# Patient Record
Sex: Female | Born: 1935 | Race: White | Hispanic: No | Smoking: Never smoker
Health system: Southern US, Community
[De-identification: ages and names within clinical notes are randomized; demographics above are authoritative.]

## PROBLEM LIST (undated history)

## (undated) DIAGNOSIS — I10 Essential (primary) hypertension: Secondary | ICD-10-CM

## (undated) DIAGNOSIS — E7800 Pure hypercholesterolemia, unspecified: Secondary | ICD-10-CM

## (undated) DIAGNOSIS — Z87442 Personal history of urinary calculi: Secondary | ICD-10-CM

## (undated) DIAGNOSIS — C801 Malignant (primary) neoplasm, unspecified: Secondary | ICD-10-CM

## (undated) HISTORY — DX: Essential (primary) hypertension: I10

## (undated) HISTORY — PX: TONSILLECTOMY AND ADENOIDECTOMY: SHX28

## (undated) HISTORY — PX: EYE SURGERY: SHX253

## (undated) HISTORY — PX: TONSILLECTOMY: SUR1361

## (undated) HISTORY — PX: APPENDECTOMY: SHX54

---

## 2002-06-09 ENCOUNTER — Emergency Department (HOSPITAL_COMMUNITY): Admission: EM | Admit: 2002-06-09 | Discharge: 2002-06-10 | Payer: Self-pay | Admitting: Emergency Medicine

## 2009-08-29 HISTORY — PX: OTHER SURGICAL HISTORY: SHX169

## 2010-03-02 ENCOUNTER — Ambulatory Visit (HOSPITAL_COMMUNITY): Admission: RE | Admit: 2010-03-02 | Discharge: 2010-03-02 | Payer: Self-pay | Admitting: Internal Medicine

## 2010-03-03 ENCOUNTER — Ambulatory Visit (HOSPITAL_COMMUNITY): Admission: RE | Admit: 2010-03-03 | Discharge: 2010-03-03 | Payer: Self-pay | Admitting: Internal Medicine

## 2013-10-08 DIAGNOSIS — R109 Unspecified abdominal pain: Secondary | ICD-10-CM | POA: Diagnosis not present

## 2013-10-08 DIAGNOSIS — N390 Urinary tract infection, site not specified: Secondary | ICD-10-CM | POA: Diagnosis not present

## 2013-11-18 ENCOUNTER — Other Ambulatory Visit (HOSPITAL_COMMUNITY): Payer: Self-pay | Admitting: Internal Medicine

## 2013-11-18 ENCOUNTER — Ambulatory Visit (HOSPITAL_COMMUNITY)
Admission: RE | Admit: 2013-11-18 | Discharge: 2013-11-18 | Disposition: A | Discharge status: Home / Self Care | Payer: Medicare Other | Source: Ambulatory Visit | Attending: Internal Medicine | Admitting: Internal Medicine

## 2013-11-18 DIAGNOSIS — R059 Cough, unspecified: Secondary | ICD-10-CM | POA: Diagnosis not present

## 2013-11-18 DIAGNOSIS — J40 Bronchitis, not specified as acute or chronic: Secondary | ICD-10-CM

## 2013-11-18 DIAGNOSIS — Z982 Presence of cerebrospinal fluid drainage device: Secondary | ICD-10-CM | POA: Diagnosis not present

## 2013-11-18 DIAGNOSIS — R05 Cough: Secondary | ICD-10-CM | POA: Insufficient documentation

## 2013-11-18 DIAGNOSIS — R509 Fever, unspecified: Secondary | ICD-10-CM | POA: Insufficient documentation

## 2013-11-18 DIAGNOSIS — J410 Simple chronic bronchitis: Secondary | ICD-10-CM | POA: Diagnosis not present

## 2013-11-20 ENCOUNTER — Emergency Department (HOSPITAL_COMMUNITY)
Admission: EM | Admit: 2013-11-20 | Discharge: 2013-11-20 | Disposition: A | Discharge status: Home / Self Care | Payer: Medicare Other | Attending: Emergency Medicine | Admitting: Emergency Medicine

## 2013-11-20 ENCOUNTER — Emergency Department (HOSPITAL_COMMUNITY): Payer: Medicare Other

## 2013-11-20 ENCOUNTER — Encounter (HOSPITAL_COMMUNITY): Payer: Self-pay | Admitting: Emergency Medicine

## 2013-11-20 DIAGNOSIS — Z79899 Other long term (current) drug therapy: Secondary | ICD-10-CM | POA: Diagnosis not present

## 2013-11-20 DIAGNOSIS — J069 Acute upper respiratory infection, unspecified: Secondary | ICD-10-CM

## 2013-11-20 DIAGNOSIS — Z792 Long term (current) use of antibiotics: Secondary | ICD-10-CM | POA: Diagnosis not present

## 2013-11-20 DIAGNOSIS — R509 Fever, unspecified: Secondary | ICD-10-CM

## 2013-11-20 DIAGNOSIS — R059 Cough, unspecified: Secondary | ICD-10-CM | POA: Diagnosis not present

## 2013-11-20 DIAGNOSIS — R071 Chest pain on breathing: Secondary | ICD-10-CM | POA: Diagnosis not present

## 2013-11-20 DIAGNOSIS — R05 Cough: Secondary | ICD-10-CM | POA: Diagnosis not present

## 2013-11-20 LAB — URINALYSIS, ROUTINE W REFLEX MICROSCOPIC
Glucose, UA: NEGATIVE mg/dL
HGB URINE DIPSTICK: NEGATIVE
Ketones, ur: 15 mg/dL — AB
Leukocytes, UA: NEGATIVE
NITRITE: NEGATIVE
PH: 5 (ref 5.0–8.0)
Protein, ur: NEGATIVE mg/dL
Urobilinogen, UA: 0.2 mg/dL (ref 0.0–1.0)

## 2013-11-20 LAB — CBC WITH DIFFERENTIAL/PLATELET
Basophils Absolute: 0 10*3/uL (ref 0.0–0.1)
Basophils Relative: 0 % (ref 0–1)
EOS ABS: 0 10*3/uL (ref 0.0–0.7)
EOS PCT: 1 % (ref 0–5)
HCT: 35.7 % — ABNORMAL LOW (ref 36.0–46.0)
HEMOGLOBIN: 11.4 g/dL — AB (ref 12.0–15.0)
LYMPHS PCT: 13 % (ref 12–46)
Lymphs Abs: 0.4 10*3/uL — ABNORMAL LOW (ref 0.7–4.0)
MCH: 29.5 pg (ref 26.0–34.0)
MCHC: 31.9 g/dL (ref 30.0–36.0)
MCV: 92.5 fL (ref 78.0–100.0)
MONOS PCT: 9 % (ref 3–12)
Monocytes Absolute: 0.3 10*3/uL (ref 0.1–1.0)
Neutro Abs: 2.2 10*3/uL (ref 1.7–7.7)
Neutrophils Relative %: 77 % (ref 43–77)
Platelets: 139 10*3/uL — ABNORMAL LOW (ref 150–400)
RBC: 3.86 MIL/uL — AB (ref 3.87–5.11)
RDW: 14.4 % (ref 11.5–15.5)
WBC: 2.8 10*3/uL — ABNORMAL LOW (ref 4.0–10.5)

## 2013-11-20 LAB — BASIC METABOLIC PANEL
BUN: 27 mg/dL — AB (ref 6–23)
CALCIUM: 8.8 mg/dL (ref 8.4–10.5)
CHLORIDE: 106 meq/L (ref 96–112)
CO2: 26 meq/L (ref 19–32)
Creatinine, Ser: 1.18 mg/dL — ABNORMAL HIGH (ref 0.50–1.10)
GFR calc non Af Amer: 41 mL/min — ABNORMAL LOW (ref >90)
GFR, EST AFRICAN AMERICAN: 47 mL/min — AB (ref >90)
Glucose, Bld: 107 mg/dL — ABNORMAL HIGH (ref 70–99)
POTASSIUM: 4.5 meq/L (ref 3.7–5.3)
SODIUM: 142 meq/L (ref 137–147)

## 2013-11-20 NOTE — ED Notes (Signed)
PT c/o cough and fever since Monday.  Reports has been taking tylenol but can't get temp below 100.  Reports had chest x ray Monday and was put on antibiotics by dr. Legrand Rams.  Pt took 1st dose last night around 6pm.

## 2013-11-20 NOTE — ED Provider Notes (Addendum)
CSN: [id number]     Arrival date & time 11/20/13  1229 History  This chart was scribed for Merryl Hacker, MD by Anastasia Pall, ED Scribe. This patient was seen in room APA19/APA19 and the patient's care was started at 1:16 PM.    Chief Complaint  Patient presents with  . Cough  . Fever   (Consider location/radiation/quality/duration/timing/severity/associated sxs/prior Treatment) The history is provided by the patient. No language interpreter was used.   HPI Comments: Carol Duarte is a 80 y.o. female who presents to the Emergency Department complaining of unproductive cough, onset 3 days ago, with associated fever, with a max temperature of 101.9. Relatives states pt has not been able to reduce fever below 100, despite tylenol.  She reports having a breathing treatment and CXR done 2 days ago. Pt denies noticing a difference with the breathing treatment. She was given antibiotics 2 days ago, but just started the course yesterday. She reports anterior chest pain but only with coughing and worse with coughing.  No exertional component and denies CP now.  She denies being around any sick contacts. She states she lives with her daughter. She reports having bladder infection 3 weeks ago. She denies having flu immunization this year. She denies SOB, fever, body aches, and any other associated symptoms. She reports h/o cholesterolemia and HTN, takes medication. She denies h/o smoking, EtOH use.   PCP [name], MD  History reviewed. No pertinent past medical history. Past Surgical History  Procedure Laterality Date  . Brain cyst removed     No family history on file. History  Substance Use Topics  . Smoking status: Never Smoker   . Smokeless tobacco: Not on file  . Alcohol Use: No   OB History   Grav Para Term Preterm Abortions TAB SAB Ect Mult Living                 Review of Systems  Constitutional: Positive for fever and chills.  Respiratory: Positive for cough. Negative for  chest tightness and shortness of breath.   Cardiovascular: Negative for chest pain.  Gastrointestinal: Negative for nausea, vomiting and abdominal pain.  Genitourinary: Negative for dysuria.  Musculoskeletal: Negative for back pain and myalgias.  Skin: Negative for rash.  Neurological: Negative for headaches.  Psychiatric/Behavioral: Negative for confusion.  All other systems reviewed and are negative.   Allergies  Review of patient's allergies indicates no known allergies.  Home Medications   Current Outpatient Rx  Name  Route  Sig  Dispense  Refill  . acetaminophen (TYLENOL) 500 MG tablet   Oral   Take 1,000 mg by mouth every 6 (six) hours as needed for fever.         Marland Kitchen azithromycin (ZITHROMAX) 250 MG tablet   Oral   Take 250 mg by mouth See admin instructions. Take 2 tabs the first day, then 1 tab until gone.  Starting 11/19/2013 (z-pak)         . benzonatate (TESSALON) 100 MG capsule   Oral   Take 100 mg by mouth 3 (three) times daily.         Marland Kitchen labetalol (NORMODYNE) 100 MG tablet   Oral   Take 100 mg by mouth daily.         Marland Kitchen oxybutynin (DITROPAN) 5 MG tablet   Oral   Take 5 mg by mouth 3 (three) times daily.         . simvastatin (ZOCOR) 40 MG tablet   Oral  Take 40 mg by mouth daily.           BP 175/73  Pulse 63  Temp(Src) 99.2 F (37.3 C) (Oral)  Resp 18  Ht 5' 2.75" (1.594 m)  Wt 140 lb (63.504 kg)  BMI 24.99 kg/m2  SpO2 96%  Physical Exam  Nursing note and vitals reviewed. Constitutional: She is oriented to person, place, and time. No distress.  Elderly, appears younger than stated age  HENT:  Head: Normocephalic and atraumatic.  Mouth/Throat: Oropharynx is clear and moist.  Eyes: Pupils are equal, round, and reactive to light.  Neck: Neck supple.  Cardiovascular: Normal rate, regular rhythm and normal heart sounds.   No murmur heard. Pulmonary/Chest: Effort normal and breath sounds normal. No respiratory distress. She has no  wheezes. She exhibits tenderness.  Abdominal: Soft. Bowel sounds are normal. There is no tenderness. There is no rebound and no guarding.  Neurological: She is alert and oriented to person, place, and time.  Skin: Skin is warm and dry. No rash noted.  Psychiatric: She has a normal mood and affect.    ED Course  Procedures (including critical care time)  DIAGNOSTIC STUDIES: Oxygen Saturation is 96% on room air, normal by my interpretation.    COORDINATION OF CARE: 1:20 PM-Discussed treatment plan which includes CXR and blood work with pt at bedside and pt agreed to plan.   Results for orders placed during the hospital encounter of 11/20/13  CBC WITH DIFFERENTIAL      Result Value Ref Range   WBC 2.8 (*) 4.0 - 10.5 K/uL   RBC 3.86 (*) 3.87 - 5.11 MIL/uL   Hemoglobin 11.4 (*) 12.0 - 15.0 g/dL   HCT 35.7 (*) 36.0 - 46.0 %   MCV 92.5  78.0 - 100.0 fL   MCH 29.5  26.0 - 34.0 pg   MCHC 31.9  30.0 - 36.0 g/dL   RDW 14.4  11.5 - 15.5 %   Platelets 139 (*) 150 - 400 K/uL   Neutrophils Relative % 77  43 - 77 %   Neutro Abs 2.2  1.7 - 7.7 K/uL   Lymphocytes Relative 13  12 - 46 %   Lymphs Abs 0.4 (*) 0.7 - 4.0 K/uL   Monocytes Relative 9  3 - 12 %   Monocytes Absolute 0.3  0.1 - 1.0 K/uL   Eosinophils Relative 1  0 - 5 %   Eosinophils Absolute 0.0  0.0 - 0.7 K/uL   Basophils Relative 0  0 - 1 %   Basophils Absolute 0.0  0.0 - 0.1 K/uL  BASIC METABOLIC PANEL      Result Value Ref Range   Sodium 142  137 - 147 mEq/L   Potassium 4.5  3.7 - 5.3 mEq/L   Chloride 106  96 - 112 mEq/L   CO2 26  19 - 32 mEq/L   Glucose, Bld 107 (*) 70 - 99 mg/dL   BUN 27 (*) 6 - 23 mg/dL   Creatinine, Ser 1.18 (*) 0.50 - 1.10 mg/dL   Calcium 8.8  8.4 - 10.5 mg/dL   GFR calc non Af Amer 41 (*) >90 mL/min   GFR calc Af Amer 47 (*) >90 mL/min  URINALYSIS, ROUTINE W REFLEX MICROSCOPIC      Result Value Ref Range   Color, Urine YELLOW  YELLOW   APPearance CLEAR  CLEAR   Specific Gravity, Urine >1.030 (*)  1.005 - 1.030   pH 5.0  5.0 - 8.0  Glucose, UA NEGATIVE  NEGATIVE mg/dL   Hgb urine dipstick NEGATIVE  NEGATIVE   Bilirubin Urine SMALL (*) NEGATIVE   Ketones, ur 15 (*) NEGATIVE mg/dL   Protein, ur NEGATIVE  NEGATIVE mg/dL   Urobilinogen, UA 0.2  0.0 - 1.0 mg/dL   Nitrite NEGATIVE  NEGATIVE   Leukocytes, UA NEGATIVE  NEGATIVE   Dg Chest 2 View  11/20/2013   CLINICAL DATA:  Cough and fever  EXAM: CHEST  2 VIEW  COMPARISON:  11/18/2013  FINDINGS: The heart size and mediastinal contours are within normal limits. Both lungs are clear. The visualized skeletal structures are unremarkable. Visualized portions of presumed ventriculoperitoneal shunt catheter are contiguous.  IMPRESSION: No active cardiopulmonary disease.   Electronically Signed   By: Conchita Paris M.D.   On: 11/20/2013 14:06    EKG Interpretation   Date/Time:  Wednesday November 20 2013 15:47:19 EDT Ventricular Rate:  62 PR Interval:  152 QRS Duration: 64 QT Interval:  406 QTC Calculation: 412 R Axis:   59 Text Interpretation:  Normal sinus rhythm Normal ECG No previous ECGs  available Confirmed by Alexius Ellington  MD, Loma Sousa ([id number]) on 11/20/2013 10:09:41  PM     Medications - No data to display MDM   Final diagnoses:  Upper respiratory virus  Fever    Patient presents with fever and persistent cough. Was seen on Monday by primary care physician and placed on azithromycin and Tessalon Perles. She is afebrile here.  Rectal temperature confirmed. She is nontoxic-appearing and her exam is benign. No notable wheezing.  Lab work is notable for mild leukopenia 2.8 with an unknown baseline as well as a creatinine of 1.18 with an unknown baseline. Chest x-ray shows no evidence of infiltrate. Patient was monitored and maintained O2 saturations. Suspect viral etiology.  Patient reports chest pain but has anterior tenderness palpation on exam and the pain only comes with coughing. Suspect musculoskeletal etiology. Patient is out of the  window for Tamiflu. I discussed the patient and the daughter continuing azithromycin and following up with Dr. Legrand Rams in 1-2 days for recheck.  Multiple repeat exams have been reassuring. Discussed discharge plan with Dr. Josephine Cables nurse.  After history, exam, and medical workup I feel the patient has been appropriately medically screened and is safe for discharge home. Pertinent diagnoses were discussed with the patient. Patient was given return precautions.   I personally performed the services described in this documentation, which was scribed in my presence. The recorded information has been reviewed and is accurate.    Merryl Hacker, MD 11/20/13 [place], MD 11/20/13 2209

## 2013-11-20 NOTE — Discharge Instructions (Signed)

## 2014-02-17 DIAGNOSIS — I10 Essential (primary) hypertension: Secondary | ICD-10-CM | POA: Diagnosis not present

## 2014-02-17 DIAGNOSIS — N39498 Other specified urinary incontinence: Secondary | ICD-10-CM | POA: Diagnosis not present

## 2014-02-17 DIAGNOSIS — E7800 Pure hypercholesterolemia, unspecified: Secondary | ICD-10-CM | POA: Diagnosis not present

## 2014-06-25 DIAGNOSIS — M25511 Pain in right shoulder: Secondary | ICD-10-CM | POA: Diagnosis not present

## 2014-06-26 ENCOUNTER — Ambulatory Visit (HOSPITAL_COMMUNITY)
Admission: RE | Admit: 2014-06-26 | Discharge: 2014-06-26 | Disposition: A | Discharge status: Home / Self Care | Payer: Medicare Other | Source: Ambulatory Visit | Attending: Internal Medicine | Admitting: Internal Medicine

## 2014-06-26 ENCOUNTER — Other Ambulatory Visit (HOSPITAL_COMMUNITY): Payer: Self-pay | Admitting: Internal Medicine

## 2014-06-26 DIAGNOSIS — M25511 Pain in right shoulder: Secondary | ICD-10-CM | POA: Diagnosis not present

## 2014-06-26 DIAGNOSIS — S4991XA Unspecified injury of right shoulder and upper arm, initial encounter: Secondary | ICD-10-CM | POA: Diagnosis not present

## 2014-07-10 ENCOUNTER — Encounter: Payer: Self-pay | Admitting: Orthopedic Surgery

## 2014-07-10 ENCOUNTER — Ambulatory Visit (INDEPENDENT_AMBULATORY_CARE_PROVIDER_SITE_OTHER): Payer: Medicare Other | Admitting: Orthopedic Surgery

## 2014-07-10 VITALS — BP 177/100 | Ht 62.0 in | Wt 140.0 lb

## 2014-07-10 DIAGNOSIS — M7550 Bursitis of unspecified shoulder: Secondary | ICD-10-CM | POA: Insufficient documentation

## 2014-07-10 DIAGNOSIS — M7551 Bursitis of right shoulder: Secondary | ICD-10-CM

## 2014-07-10 NOTE — Patient Instructions (Addendum)

## 2014-07-10 NOTE — Progress Notes (Signed)
Patient ID: Carol Duarte, female   DOB: 1924/09/02, 80 y.o.   MRN: [id number] Chief Complaint  Patient presents with  . Shoulder Pain    Right shoulder pain. Referred by DR. Fanta    BP 177/100 mmHg  Ht 5\' 2"  (1.575 m)  Wt 140 lb (63.504 kg)  BMI 25.60 kg/m2  This is an active 80 year old female who presents with H medical onset of sharp throbbing aching 6 out of 10 constant pain over the right shoulder. X-rays show no abnormality. Review of systems hearing loss cough chest pain or loss of bladder control abdominal pain otherwise systems negative.  Past Medical History  Diagnosis Date  . Hypertension    Past Surgical History  Procedure Laterality Date  . Brain cyst removed    . Appendectomy    . Tonsillectomy and adenoidectomy      Overall appearance is normal. She is oriented 3. Mood and affect normal. Ambulation normal.  Left shoulder full range of motion. Ligament stable. Muscle tone and strength normal. Skin normal.  Right shoulder mild. Acromial tenderness painful [place] elevation. Active range of motion 100 of flexion passively 150 of flexion positive impingement sign at 120 of flexion. Ligaments are stable apprehension sign negative rotator cuff strength normal skin intact. Normal distal pulses. Lymph nodes in the cervical area are benign. Sensation is normal. No pathologic reflexes noted.  Independent x-ray interpretation normal 3 views of the left shoulder  Report reviewFINDINGS: Three views of the right shoulder submitted. No acute fracture or subluxation. Mild spurring of acromion. Mild degenerative changes AC joint. Glenohumeral joint is preserved.   IMPRESSION: No acute fracture or subluxation. Mild degenerative changes AC joint.     Electronically Signed   By: Lahoma Crocker M.D.   On: 06/26/2014 11:12   Encounter Diagnosis  Name Primary?  . Bursitis, shoulder, right Yes    Procedure note the subacromial injection shoulder Right  Verbal consent was  obtained to inject the  right  Shoulder  Timeout was completed to confirm the injection site is a subacromial space of the  right shoulder   Medication used Depo-Medrol 40 mg and lidocaine 1% 3 cc  Anesthesia was provided by ethyl chloride  The injection was performed in the right posterior subacromial space. After pinning the skin with alcohol and anesthetized the skin with ethyl chloride the subacromial space was injected using a 20-gauge needle. There were no complications  Sterile dressing was applied.  Follow-up as needed

## 2014-08-18 DIAGNOSIS — G930 Cerebral cysts: Secondary | ICD-10-CM | POA: Diagnosis not present

## 2014-08-18 DIAGNOSIS — E780 Pure hypercholesterolemia: Secondary | ICD-10-CM | POA: Diagnosis not present

## 2014-08-18 DIAGNOSIS — I10 Essential (primary) hypertension: Secondary | ICD-10-CM | POA: Diagnosis not present

## 2014-08-18 DIAGNOSIS — Z0000 Encounter for general adult medical examination without abnormal findings: Secondary | ICD-10-CM | POA: Diagnosis not present

## 2014-11-24 DIAGNOSIS — I10 Essential (primary) hypertension: Secondary | ICD-10-CM | POA: Diagnosis not present

## 2014-11-24 DIAGNOSIS — E780 Pure hypercholesterolemia: Secondary | ICD-10-CM | POA: Diagnosis not present

## 2015-02-23 DIAGNOSIS — E780 Pure hypercholesterolemia: Secondary | ICD-10-CM | POA: Diagnosis not present

## 2015-02-23 DIAGNOSIS — G930 Cerebral cysts: Secondary | ICD-10-CM | POA: Diagnosis not present

## 2015-02-23 DIAGNOSIS — R739 Hyperglycemia, unspecified: Secondary | ICD-10-CM | POA: Diagnosis not present

## 2015-02-23 DIAGNOSIS — Z0000 Encounter for general adult medical examination without abnormal findings: Secondary | ICD-10-CM | POA: Diagnosis not present

## 2015-02-23 DIAGNOSIS — I10 Essential (primary) hypertension: Secondary | ICD-10-CM | POA: Diagnosis not present

## 2015-03-16 DIAGNOSIS — E119 Type 2 diabetes mellitus without complications: Secondary | ICD-10-CM | POA: Diagnosis not present

## 2015-04-03 DIAGNOSIS — L03119 Cellulitis of unspecified part of limb: Secondary | ICD-10-CM | POA: Diagnosis not present

## 2015-04-03 DIAGNOSIS — W5501XS Bitten by cat, sequela: Secondary | ICD-10-CM | POA: Diagnosis not present

## 2015-05-26 DIAGNOSIS — E785 Hyperlipidemia, unspecified: Secondary | ICD-10-CM | POA: Diagnosis not present

## 2015-05-26 DIAGNOSIS — G930 Cerebral cysts: Secondary | ICD-10-CM | POA: Diagnosis not present

## 2015-05-26 DIAGNOSIS — E119 Type 2 diabetes mellitus without complications: Secondary | ICD-10-CM | POA: Diagnosis not present

## 2015-05-26 DIAGNOSIS — Z23 Encounter for immunization: Secondary | ICD-10-CM | POA: Diagnosis not present

## 2015-05-26 DIAGNOSIS — E780 Pure hypercholesterolemia: Secondary | ICD-10-CM | POA: Diagnosis not present

## 2015-05-26 DIAGNOSIS — I10 Essential (primary) hypertension: Secondary | ICD-10-CM | POA: Diagnosis not present

## 2015-05-26 DIAGNOSIS — E1165 Type 2 diabetes mellitus with hyperglycemia: Secondary | ICD-10-CM | POA: Diagnosis not present

## 2015-08-25 DIAGNOSIS — E785 Hyperlipidemia, unspecified: Secondary | ICD-10-CM | POA: Diagnosis not present

## 2015-08-25 DIAGNOSIS — I10 Essential (primary) hypertension: Secondary | ICD-10-CM | POA: Diagnosis not present

## 2015-08-25 DIAGNOSIS — E119 Type 2 diabetes mellitus without complications: Secondary | ICD-10-CM | POA: Diagnosis not present

## 2015-11-04 NOTE — Patient Instructions (Signed)
Your procedure is scheduled on:  11/09/2015               Report to [facility] at   6:30  AM.  Call this number if you have problems the morning of surgery: [phone]   Remember:   Do not eat or drink :After Midnight.    Take these medicines the morning of surgery with A SIP OF WATER:   Amlodipine and labetalol         Do not wear jewelry, make-up or nail polish.  Do not wear lotions, powders, or perfumes. You may wear deodorant.  Do not bring valuables to the hospital.  Contacts, dentures or bridgework may not be worn into surgery.  Patients discharged the day of surgery will not be allowed to drive home.  Name and phone number of your driver:    @[email or web address] @ Cataract Surgery  A cataract is a clouding of the lens of the eye. When a lens becomes cloudy, vision is reduced based on the degree and nature of the clouding. Surgery may be needed to improve vision. Surgery removes the cloudy lens and usually replaces it with a substitute lens (intraocular lens, IOL). LET YOUR EYE DOCTOR KNOW ABOUT:  Allergies to food or medicine.   Medicines taken including herbs, eyedrops, over-the-counter medicines, and creams.   Use of steroids (by mouth or creams).   Previous problems with anesthetics or numbing medicine.   History of bleeding problems or blood clots.   Previous surgery.   Other health problems, including diabetes and kidney problems.   Possibility of pregnancy, if this applies.  RISKS AND COMPLICATIONS  Infection.   Inflammation of the eyeball (endophthalmitis) that can spread to both eyes (sympathetic ophthalmia).   Poor wound healing.   If an IOL is inserted, it can later fall out of proper position. This is very uncommon.   Clouding of the part of your eye that holds an IOL in place. This is called an "after-cataract." These are uncommon, but easily treated.  BEFORE THE PROCEDURE  Do not eat or drink anything except small amounts of water for 8 to 12  before your surgery, or as directed by your caregiver.   Unless you are told otherwise, continue any eyedrops you have been prescribed.   Talk to your primary caregiver about all other medicines that you take (both prescription and non-prescription). In some cases, you may need to stop or change medicines near the time of your surgery. This is most important if you are taking blood-thinning medicine.Do not stop medicines unless you are told to do so.   Arrange for someone to drive you to and from the procedure.   Do not put contact lenses in either eye on the day of your surgery.  PROCEDURE There is more than one method for safely removing a cataract. Your doctor can explain the differences and help determine which is best for you. Phacoemulsification surgery is the most common form of cataract surgery.  An injection is given behind the eye or eyedrops are given to make this a painless procedure.   A small cut (incision) is made on the edge of the clear, dome-shaped surface that covers the front of the eye (cornea).   A tiny probe is painlessly inserted into the eye. This device gives off ultrasound waves that soften and break up the cloudy center of the lens. This makes it easier for the cloudy lens to be removed by suction.   An IOL may  be implanted.   The normal lens of the eye is covered by a clear capsule. Part of that capsule is intentionally left in the eye to support the IOL.   Your surgeon may or may not use stitches to close the incision.  There are other forms of cataract surgery that require a larger incision and stiches to close the eye. This approach is taken in cases where the doctor feels that the cataract cannot be easily removed using phacoemulsification. AFTER THE PROCEDURE  When an IOL is implanted, it does not need care. It becomes a permanent part of your eye and cannot be seen or felt.   Your doctor will schedule follow-up exams to check on your progress.    Review your other medicines with your doctor to see which can be resumed after surgery.   Use eyedrops or take medicine as prescribed by your doctor.  Document Released: 08/04/2011 Document Reviewed: 08/01/2011 [facility] Patient Information 2012 [place].  .Cataract Surgery Care After Refer to this sheet in the next few weeks. These instructions provide you with information on caring for yourself after your procedure. Your caregiver may also give you more specific instructions. Your treatment has been planned according to current medical practices, but problems sometimes occur. Call your caregiver if you have any problems or questions after your procedure.  HOME CARE INSTRUCTIONS   Avoid strenuous activities as directed by your caregiver.   Ask your caregiver when you can resume driving.   Use eyedrops or other medicines to help healing and control pressure inside your eye as directed by your caregiver.   Only take over-the-counter or prescription medicines for pain, discomfort, or fever as directed by your caregiver.   Do not to touch or rub your eyes.   You may be instructed to use a protective shield during the first few days and nights after surgery. If not, wear sunglasses to protect your eyes. This is to protect the eye from pressure or from being accidentally bumped.   Keep the area around your eye clean and dry. Avoid swimming or allowing water to hit you directly in the face while showering. Keep soap and shampoo out of your eyes.   Do not bend or lift heavy objects. Bending increases pressure in the eye. You can walk, climb stairs, and do light household chores.   Do not put a contact lens into the eye that had surgery until your caregiver says it is okay to do so.   Ask your doctor when you can return to work. This will depend on the kind of work that you do. If you work in a dusty environment, you may be advised to wear protective eyewear for a period of time.    Ask your caregiver when it will be safe to engage in sexual activity.   Continue with your regular eye exams as directed by your caregiver.  What to expect:  It is normal to feel itching and mild discomfort for a few days after cataract surgery. Some fluid discharge is also common, and your eye may be sensitive to light and touch.   After 1 to 2 days, even moderate discomfort should disappear. In most cases, healing will take about 6 weeks.   If you received an intraocular lens (IOL), you may notice that colors are very bright or have a blue tinge. Also, if you have been in bright sunlight, everything may appear reddish for a few hours. If you see these color tinges, it is  because your lens is clear and no longer cloudy. Within a few months after receiving an IOL, these extra colors should go away. When you have healed, you will probably need new glasses.  SEEK MEDICAL CARE IF:   You have increased bruising around your eye.   You have discomfort not helped by medicine.  SEEK IMMEDIATE MEDICAL CARE IF:   You have a fever.   You have a worsening or sudden vision loss.   You have redness, swelling, or increasing pain in the eye.   You have a thick discharge from the eye that had surgery.  MAKE SURE YOU:  Understand these instructions.   Will watch your condition.   Will get help right away if you are not doing well or get worse.  Document Released: 03/04/2005 Document Revised: 08/04/2011 Document Reviewed: 04/08/2011 [facility] Patient Information 2012 [place].    Monitored Anesthesia Care  Monitored anesthesia care is an anesthesia service for a medical procedure. Anesthesia is the loss of the ability to feel pain. It is produced by medications called anesthetics. It may affect a small area of your body (local anesthesia), a large area of your body (regional anesthesia), or your entire body (general anesthesia). The need for monitored anesthesia care depends your  procedure, your condition, and the potential need for regional or general anesthesia. It is often provided during procedures where:   General anesthesia may be needed if there are complications. This is because you need special care when you are under general anesthesia.   You will be under local or regional anesthesia. This is so that you are able to have higher levels of anesthesia if needed.   You will receive calming medications (sedatives). This is especially the case if sedatives are given to put you in a semi-conscious state of relaxation (deep sedation). This is because the amount of sedative needed to produce this state can be hard to predict. Too much of a sedative can produce general anesthesia. Monitored anesthesia care is performed by one or more caregivers who have special training in all types of anesthesia. You will need to meet with these caregivers before your procedure. During this meeting, they will ask you about your medical history. They will also give you instructions to follow. (For example, you will need to stop eating and drinking before your procedure. You may also need to stop or change medications you are taking.) During your procedure, your caregivers will stay with you. They will:   Watch your condition. This includes watching you blood pressure, breathing, and level of pain.   Diagnose and treat problems that occur.   Give medications if they are needed. These may include calming medications (sedatives) and anesthetics.   Make sure you are comfortable.  Having monitored anesthesia care does not necessarily mean that you will be under anesthesia. It does mean that your caregivers will be able to manage anesthesia if you need it or if it occurs. It also means that you will be able to have a different type of anesthesia than you are having if you need it. When your procedure is complete, your caregivers will continue to watch your condition. They will make sure any  medications wear off before you are allowed to go home.  Document Released: 05/11/2005 Document Revised: 12/10/2012 Document Reviewed: 09/26/2012 [facility] Patient Information 2014 [place], [place].

## 2015-11-05 ENCOUNTER — Encounter (HOSPITAL_COMMUNITY): Payer: Self-pay

## 2015-11-05 ENCOUNTER — Other Ambulatory Visit: Payer: Self-pay

## 2015-11-05 ENCOUNTER — Encounter (HOSPITAL_COMMUNITY)
Admission: RE | Admit: 2015-11-05 | Discharge: 2015-11-05 | Disposition: A | Discharge status: Home / Self Care | Payer: Medicare Other | Source: Ambulatory Visit | Attending: Ophthalmology | Admitting: Ophthalmology

## 2015-11-05 DIAGNOSIS — Z01810 Encounter for preprocedural cardiovascular examination: Secondary | ICD-10-CM | POA: Insufficient documentation

## 2015-11-05 DIAGNOSIS — Z01812 Encounter for preprocedural laboratory examination: Secondary | ICD-10-CM | POA: Insufficient documentation

## 2015-11-05 DIAGNOSIS — I10 Essential (primary) hypertension: Secondary | ICD-10-CM | POA: Diagnosis not present

## 2015-11-05 LAB — BASIC METABOLIC PANEL
Anion gap: 6 (ref 5–15)
BUN: 32 mg/dL — AB (ref 6–20)
CHLORIDE: 108 mmol/L (ref 101–111)
CO2: 25 mmol/L (ref 22–32)
CREATININE: 1.13 mg/dL — AB (ref 0.44–1.00)
Calcium: 9 mg/dL (ref 8.9–10.3)
GFR calc Af Amer: 49 mL/min — ABNORMAL LOW (ref >60)
GFR calc non Af Amer: 42 mL/min — ABNORMAL LOW (ref >60)
Glucose, Bld: 123 mg/dL — ABNORMAL HIGH (ref 65–99)
Potassium: 4.5 mmol/L (ref 3.5–5.1)
Sodium: 139 mmol/L (ref 135–145)

## 2015-11-05 LAB — CBC
HEMATOCRIT: 34.6 % — AB (ref 36.0–46.0)
HEMOGLOBIN: 10.9 g/dL — AB (ref 12.0–15.0)
MCH: 27 pg (ref 26.0–34.0)
MCHC: 31.5 g/dL (ref 30.0–36.0)
MCV: 85.9 fL (ref 78.0–100.0)
Platelets: 207 10*3/uL (ref 150–400)
RBC: 4.03 MIL/uL (ref 3.87–5.11)
RDW: 15.2 % (ref 11.5–15.5)
WBC: 3.9 10*3/uL — ABNORMAL LOW (ref 4.0–10.5)

## 2015-11-09 ENCOUNTER — Ambulatory Visit (HOSPITAL_COMMUNITY)
Admission: RE | Admit: 2015-11-09 | Discharge: 2015-11-09 | Disposition: A | Discharge status: Home / Self Care | Payer: Medicare Other | Source: Ambulatory Visit | Attending: Ophthalmology | Admitting: Ophthalmology

## 2015-11-09 ENCOUNTER — Encounter (HOSPITAL_COMMUNITY): Payer: Self-pay | Admitting: *Deleted

## 2015-11-09 ENCOUNTER — Encounter (HOSPITAL_COMMUNITY)
Admission: RE | Disposition: A | Discharge status: Home / Self Care | Payer: Self-pay | Source: Ambulatory Visit | Attending: Ophthalmology

## 2015-11-09 ENCOUNTER — Ambulatory Visit (HOSPITAL_COMMUNITY): Payer: Medicare Other | Admitting: Anesthesiology

## 2015-11-09 DIAGNOSIS — I10 Essential (primary) hypertension: Secondary | ICD-10-CM | POA: Insufficient documentation

## 2015-11-09 DIAGNOSIS — H2512 Age-related nuclear cataract, left eye: Secondary | ICD-10-CM | POA: Insufficient documentation

## 2015-11-09 DIAGNOSIS — E7800 Pure hypercholesterolemia, unspecified: Secondary | ICD-10-CM | POA: Diagnosis not present

## 2015-11-09 DIAGNOSIS — Z79899 Other long term (current) drug therapy: Secondary | ICD-10-CM | POA: Diagnosis not present

## 2015-11-09 HISTORY — PX: CATARACT EXTRACTION W/PHACO: SHX586

## 2015-11-09 SURGERY — PHACOEMULSIFICATION, CATARACT, WITH IOL INSERTION
Anesthesia: Monitor Anesthesia Care | Site: Eye | Laterality: Left

## 2015-11-09 MED ORDER — EPINEPHRINE HCL 1 MG/ML IJ SOLN
INTRAMUSCULAR | Status: AC
Start: 2015-11-09 — End: 2015-11-09
  Filled 2015-11-09: qty 1

## 2015-11-09 MED ORDER — MIDAZOLAM HCL 2 MG/2ML IJ SOLN
1.0000 mg | INTRAMUSCULAR | Status: DC | PRN
  Administered 2015-11-09: 2 mg via INTRAVENOUS
  Filled 2015-11-09: qty 2

## 2015-11-09 MED ORDER — FENTANYL CITRATE (PF) 100 MCG/2ML IJ SOLN
25.0000 ug | Freq: Once | INTRAMUSCULAR | Status: AC
  Administered 2015-11-09: 25 ug via INTRAVENOUS
  Filled 2015-11-09: qty 2

## 2015-11-09 MED ORDER — PHENYLEPHRINE HCL 2.5 % OP SOLN
1.0000 [drp] | OPHTHALMIC | Status: AC
  Administered 2015-11-09 (×3): 1 [drp] via OPHTHALMIC

## 2015-11-09 MED ORDER — POVIDONE-IODINE 5 % OP SOLN
OPHTHALMIC | Status: DC | PRN
  Administered 2015-11-09: 1 via OPHTHALMIC

## 2015-11-09 MED ORDER — LIDOCAINE HCL 3.5 % OP GEL
1.0000 "application " | Freq: Once | OPHTHALMIC | Status: AC
  Administered 2015-11-09: 1 via OPHTHALMIC

## 2015-11-09 MED ORDER — TETRACAINE HCL 0.5 % OP SOLN
1.0000 [drp] | OPHTHALMIC | Status: AC
  Administered 2015-11-09 (×3): 1 [drp] via OPHTHALMIC

## 2015-11-09 MED ORDER — EPINEPHRINE HCL 1 MG/ML IJ SOLN
INTRAOCULAR | Status: DC | PRN
  Administered 2015-11-09: 500 mL

## 2015-11-09 MED ORDER — NEOMYCIN-POLYMYXIN-DEXAMETH 3.5-10000-0.1 OP SUSP
OPHTHALMIC | Status: DC | PRN
  Administered 2015-11-09: 2 [drp] via OPHTHALMIC

## 2015-11-09 MED ORDER — BSS IO SOLN
INTRAOCULAR | Status: DC | PRN
  Administered 2015-11-09: 15 mL

## 2015-11-09 MED ORDER — LACTATED RINGERS IV SOLN
INTRAVENOUS | Status: DC
  Administered 2015-11-09: 08:00:00 via INTRAVENOUS

## 2015-11-09 MED ORDER — CYCLOPENTOLATE-PHENYLEPHRINE 0.2-1 % OP SOLN
1.0000 [drp] | OPHTHALMIC | Status: AC | PRN
  Administered 2015-11-09 (×3): 1 [drp] via OPHTHALMIC

## 2015-11-09 MED ORDER — LIDOCAINE HCL (PF) 1 % IJ SOLN
INTRAMUSCULAR | Status: DC | PRN
  Administered 2015-11-09: .6 mL

## 2015-11-09 MED ORDER — PROVISC 10 MG/ML IO SOLN
INTRAOCULAR | Status: DC | PRN
  Administered 2015-11-09: 0.85 mL via INTRAOCULAR

## 2015-11-09 SURGICAL SUPPLY — 12 items

## 2015-11-09 NOTE — Op Note (Signed)
Date of Admission: 11/09/2015  Date of Surgery: 11/09/2015   Pre-Op Dx: Cataract Left Eye  Post-Op Dx: Senile Nuclear Cataract Left  Eye,  Dx Code H25.12  Surgeon: [name], M.D.  Assistants: None  Anesthesia: Topical with MAC  Indications: Painless, progressive loss of vision with compromise of daily activities.  Surgery: Cataract Extraction with Intraocular lens Implant Left Eye  Discription: The patient had dilating drops and viscous lidocaine placed into the Left eye in the pre-op holding area. After transfer to the operating room, a time out was performed. The patient was then prepped and draped. Beginning with a 47 degree blade a paracentesis port was made at the surgeon's 2 o'clock position. The anterior chamber was then filled with 1% non-preserved lidocaine. This was followed by filling the anterior chamber with Provisc.  A 2.72mm keratome blade was used to make a clear corneal incision at the temporal limbus.  A bent cystatome needle was used to create a continuous tear capsulotomy. Hydrodissection was performed with balanced salt solution on a Fine canula. The lens nucleus was then removed using the phacoemulsification handpiece. Residual cortex was removed with the I&A handpiece. The anterior chamber and capsular bag were refilled with Provisc. A posterior chamber intraocular lens was placed into the capsular bag with it's injector. The implant was positioned with the Kuglan hook. The Provisc was then removed from the anterior chamber and capsular bag with the I&A handpiece. Stromal hydration of the main incision and paracentesis port was performed with BSS on a Fine canula. The wounds were tested for leak which was negative. The patient tolerated the procedure well. There were no operative complications. The patient was then transferred to the recovery room in stable condition.  Complications: None  Specimen: None  EBL: None  Prosthetic device: Hoya iSert 250, power 22.0 D, SN  [id number].

## 2015-11-09 NOTE — Discharge Instructions (Signed)
Anesthesia, Adult, Care After °Refer to this sheet in the next few weeks. These instructions provide you with information on caring for yourself after your procedure. Your health care provider may also give you more specific instructions. Your treatment has been planned according to current medical practices, but problems sometimes occur. Call your health care provider if you have any problems or questions after your procedure. °WHAT TO EXPECT AFTER THE PROCEDURE °After the procedure, it is typical to experience: °· Sleepiness. °· Nausea and vomiting. °HOME CARE INSTRUCTIONS °· For the first 24 hours after general anesthesia: °¨ Have a responsible person with you. °¨ Do not drive a car. If you are alone, do not take public transportation. °¨ Do not drink alcohol. °¨ Do not take medicine that has not been prescribed by your health care provider. °¨ Do not sign important papers or make important decisions. °¨ You may resume a normal diet and activities as directed by your health care provider. °· Change bandages (dressings) as directed. °· If you have questions or problems that seem related to general anesthesia, call the hospital and ask for the anesthetist or anesthesiologist on call. °SEEK MEDICAL CARE IF: °· You have nausea and vomiting that continue the day after anesthesia. °· You develop a rash. °SEEK IMMEDIATE MEDICAL CARE IF:  °· You have difficulty breathing. °· You have chest pain. °· You have any allergic problems. °  °This information is not intended to replace advice given to you by your health care provider. Make sure you discuss any questions you have with your health care provider. °  °Document Released: 11/21/2000 Document Revised: 09/05/2014 Document Reviewed: 12/14/2011 °Elsevier Interactive Patient Education ©2016 [employer] ° °

## 2015-11-09 NOTE — Anesthesia Procedure Notes (Signed)
Procedure Name: MAC Date/Time: 11/09/2015 9:06 AM Performed by: Vista Deck Pre-anesthesia Checklist: Patient identified, Emergency Drugs available, Suction available, Timeout performed and Patient being monitored Patient Re-evaluated:Patient Re-evaluated prior to inductionOxygen Delivery Method: Nasal Cannula

## 2015-11-09 NOTE — Transfer of Care (Signed)
Immediate Anesthesia Transfer of Care Note  Patient: Carol Duarte  Procedure(s) Performed: Procedure(s) (LRB): CATARACT EXTRACTION PHACO AND INTRAOCULAR LENS PLACEMENT LEFT EYE cde=8.97 (Left)  Patient Location: Shortstay  Anesthesia Type: MAC  Level of Consciousness: awake  Airway & Oxygen Therapy: Patient Spontanous Breathing   Post-op Assessment: Report given to PACU RN, Post -op Vital signs reviewed and stable and Patient moving all extremities  Post vital signs: Reviewed and stable  Complications: No apparent anesthesia complications

## 2015-11-09 NOTE — H&P (Signed)
I have reviewed the H&P, the patient was re-examined, and I have identified no interval changes in medical condition and plan of care since the history and physical of record  

## 2015-11-09 NOTE — Anesthesia Preprocedure Evaluation (Signed)
Anesthesia Evaluation  Patient identified by MRN, date of birth, ID band Patient awake    Reviewed: Allergy & Precautions, NPO status , Patient's Chart, lab work & pertinent test results  Airway Mallampati: II  TM Distance: >3 FB     Dental  (+) Teeth Intact   Pulmonary neg pulmonary ROS,    breath sounds clear to auscultation       Cardiovascular hypertension, Pt. on medications  Rhythm:Regular Rate:Normal     Neuro/Psych    GI/Hepatic negative GI ROS,   Endo/Other    Renal/GU      Musculoskeletal   Abdominal   Peds  Hematology   Anesthesia Other Findings   Reproductive/Obstetrics                             Anesthesia Physical Anesthesia Plan  ASA: II  Anesthesia Plan: MAC   Post-op Pain Management:    Induction:   Airway Management Planned: Nasal Cannula  Additional Equipment:   Intra-op Plan:   Post-operative Plan:   Informed Consent: I have reviewed the patients History and Physical, chart, labs and discussed the procedure including the risks, benefits and alternatives for the proposed anesthesia with the patient or authorized representative who has indicated his/her understanding and acceptance.     Plan Discussed with:   Anesthesia Plan Comments:         Anesthesia Quick Evaluation  

## 2015-11-09 NOTE — Anesthesia Postprocedure Evaluation (Signed)
Anesthesia Post Note  Patient: Carol Duarte  Procedure(s) Performed: Procedure(s) (LRB): CATARACT EXTRACTION PHACO AND INTRAOCULAR LENS PLACEMENT LEFT EYE cde=8.97 (Left)  Anesthesia Post Evaluation  Last Vitals:  Filed Vitals:   11/09/15 0900 11/09/15 0927  BP: 98/49 141/56  Pulse:  52  Temp:  36.3 C  Resp: 15 16    Last Pain: There were no vitals filed for this visit.               Drucie Opitz

## 2015-11-10 ENCOUNTER — Encounter (HOSPITAL_COMMUNITY): Payer: Self-pay | Admitting: Ophthalmology

## 2016-03-29 ENCOUNTER — Encounter: Payer: Self-pay | Admitting: *Deleted

## 2016-03-29 ENCOUNTER — Encounter: Payer: Self-pay | Admitting: Cardiovascular Disease

## 2016-03-29 ENCOUNTER — Ambulatory Visit (INDEPENDENT_AMBULATORY_CARE_PROVIDER_SITE_OTHER): Payer: Medicare Other | Admitting: Cardiovascular Disease

## 2016-03-29 VITALS — BP 144/100 | HR 81 | Ht 62.0 in | Wt 151.0 lb

## 2016-03-29 DIAGNOSIS — R5383 Other fatigue: Secondary | ICD-10-CM

## 2016-03-29 DIAGNOSIS — R072 Precordial pain: Secondary | ICD-10-CM | POA: Diagnosis not present

## 2016-03-29 DIAGNOSIS — E785 Hyperlipidemia, unspecified: Secondary | ICD-10-CM | POA: Diagnosis not present

## 2016-03-29 DIAGNOSIS — I10 Essential (primary) hypertension: Secondary | ICD-10-CM | POA: Diagnosis not present

## 2016-03-29 MED ORDER — ASPIRIN EC 81 MG PO TBEC: 81.0000 mg | DELAYED_RELEASE_TABLET | Freq: Every day | ORAL | 3 refills | Status: DC

## 2016-03-29 NOTE — Progress Notes (Signed)
CARDIOLOGY CONSULT NOTE  Patient ID: Carol Duarte MRN: [id number] DOB/AGE: December 24, 1924 82 y.o.  Admit date: (Not on file) Primary Physician: [name], MD Referring Physician:   Reason for Consultation: chest pain  HPI: The patient is an 82 year old woman with a history of hypertension and hyperlipidemia who is referred for the evaluation of chest pain. Approximately 3 weeks ago, she was awoken in the middle the night with left precordial pain radiating into the axilla. It lasted 30 minutes. She describes as "really bad". She denies associated shortness of breath. She palpated her breast and wondered if she had cancer. She did not go to the emergency room.  She says she works "very hard", and cleans her house, cooks 3 meals daily, and dances every Thursday night, and does yard work includes coming down tree limbs.  Since this episode of chest pain, she has felt diminished energy levels.  Her PCP has recommended aspirin 81 mg daily.    Allergies  Allergen Reactions  . Naproxen Nausea Only and Other (See Comments)    Chest pain    Current Outpatient Prescriptions  Medication Sig Dispense Refill  . acetaminophen (TYLENOL) 500 MG tablet Take 1,000 mg by mouth every 6 (six) hours as needed for fever.    Marland Kitchen amLODipine (NORVASC) 5 MG tablet Take 5 mg by mouth daily.    Marland Kitchen labetalol (NORMODYNE) 100 MG tablet Take 100 mg by mouth daily.    Marland Kitchen oxybutynin (DITROPAN) 5 MG tablet Take 5 mg by mouth daily.     . simvastatin (ZOCOR) 40 MG tablet Take 40 mg by mouth daily.     No current facility-administered medications for this visit.     Past Medical History:  Diagnosis Date  . Hypertension     Past Surgical History:  Procedure Laterality Date  . APPENDECTOMY    . brain cyst removed    . CATARACT EXTRACTION W/PHACO Left 11/09/2015   Procedure: CATARACT EXTRACTION PHACO AND INTRAOCULAR LENS PLACEMENT LEFT EYE cde=8.97;  Surgeon: [name], MD;  Location: [facility];  Service:  Ophthalmology;  Laterality: Left;  . TONSILLECTOMY AND ADENOIDECTOMY      Social History   Social History  . Marital status: Widowed    Spouse name: N/A  . Number of children: N/A  . Years of education: N/A   Occupational History  . Not on file.   Social History Main Topics  . Smoking status: Never Smoker  . Smokeless tobacco: Former Systems developer  . Alcohol use No  . Drug use: No  . Sexual activity: Not on file   Other Topics Concern  . Not on file   Social History Narrative  . No narrative on file     No family history of premature CAD in 1st degree relatives.  Prior to Admission medications   Medication Sig Start Date End Date Taking? Authorizing Provider  acetaminophen (TYLENOL) 500 MG tablet Take 1,000 mg by mouth every 6 (six) hours as needed for fever.   Yes Historical Provider, MD  amLODipine (NORVASC) 5 MG tablet Take 5 mg by mouth daily.   Yes Historical Provider, MD  labetalol (NORMODYNE) 100 MG tablet Take 100 mg by mouth daily.   Yes Historical Provider, MD  oxybutynin (DITROPAN) 5 MG tablet Take 5 mg by mouth daily.    Yes Historical Provider, MD  simvastatin (ZOCOR) 40 MG tablet Take 40 mg by mouth daily.   Yes Historical Provider, MD     Review of  systems complete and found to be negative unless listed above in HPI     Physical exam Blood pressure (!) 144/100, pulse 81, height 5\' 2"  (1.575 m), weight 151 lb (68.5 kg), SpO2 97 %. General: NAD Neck: No JVD, no thyromegaly or thyroid nodule.  Lungs: Clear to auscultation bilaterally with normal respiratory effort. CV: Nondisplaced PMI. Regular rate and rhythm, normal S1/S2, no S3/S4, no murmur.  No peripheral edema.  No carotid bruit.   Abdomen: Soft, nontender, no distention.  Skin: Intact without lesions or rashes.  Neurologic: Alert and oriented x 3.  Psych: Normal affect. Extremities: No clubbing or cyanosis.  HEENT: Normal.   ECG: Most recent ECG reviewed.  Labs:   Lab Results  Component Value  Date   WBC 3.9 (L) 11/05/2015   HGB 10.9 (L) 11/05/2015   HCT 34.6 (L) 11/05/2015   MCV 85.9 11/05/2015   PLT 207 11/05/2015   No results for input(s): NA, K, CL, CO2, BUN, CREATININE, CALCIUM, PROT, BILITOT, ALKPHOS, ALT, AST, GLUCOSE in the last 168 hours.  Invalid input(s): LABALBU No results found for: CKTOTAL, CKMB, CKMBINDEX, TROPONINI No results found for: CHOL No results found for: HDL No results found for: LDLCALC No results found for: TRIG No results found for: CHOLHDL No results found for: LDLDIRECT       Studies: No results found.  ASSESSMENT AND PLAN:  1. Chest pain and recent fatigue: Symptoms worrisome for ischemic heart disease. I will proceed with a nuclear myocardial perfusion imaging study (Lexiscan) to evaluate for ischemic heart disease. I have encouraged her to take ASA 81 mg daily.  2. Essential HTN: Elevated today but did not take meds. Encouraged to do so.  3. Hyperlipidemia: Continue statin therapy.  Dispo: fu 1 month   Signed: Kate Sable, M.D., F.A.C.C.  03/29/2016, 1:39 PM

## 2016-03-29 NOTE — Patient Instructions (Signed)
Your physician recommends that you schedule a follow-up appointment in: 1 Month with Dr. Bronson Ing.   Your physician has recommended you make the following change in your medication:    Start Taking Aspirin 81 mg Daily   Your physician has requested that you have a lexiscan myoview. For further information please visit [email or web address]. Please follow instruction sheet, as given.  If you need a refill on your cardiac medications before your next appointment, please call your pharmacy.  Thank you for choosing [facility]!

## 2016-04-03 ENCOUNTER — Observation Stay (HOSPITAL_COMMUNITY)
Admission: EM | Admit: 2016-04-03 | Discharge: 2016-04-05 | Disposition: A | Discharge status: Home / Self Care | Payer: Medicare Other | Attending: Internal Medicine | Admitting: Internal Medicine

## 2016-04-03 ENCOUNTER — Other Ambulatory Visit: Payer: Self-pay

## 2016-04-03 ENCOUNTER — Encounter (HOSPITAL_COMMUNITY): Payer: Self-pay | Admitting: *Deleted

## 2016-04-03 ENCOUNTER — Emergency Department (HOSPITAL_COMMUNITY): Payer: Medicare Other

## 2016-04-03 DIAGNOSIS — Z9842 Cataract extraction status, left eye: Secondary | ICD-10-CM | POA: Diagnosis not present

## 2016-04-03 DIAGNOSIS — K7689 Other specified diseases of liver: Secondary | ICD-10-CM | POA: Insufficient documentation

## 2016-04-03 DIAGNOSIS — N6489 Other specified disorders of breast: Secondary | ICD-10-CM | POA: Diagnosis not present

## 2016-04-03 DIAGNOSIS — Z8249 Family history of ischemic heart disease and other diseases of the circulatory system: Secondary | ICD-10-CM | POA: Diagnosis not present

## 2016-04-03 DIAGNOSIS — R079 Chest pain, unspecified: Secondary | ICD-10-CM

## 2016-04-03 DIAGNOSIS — I200 Unstable angina: Principal | ICD-10-CM | POA: Insufficient documentation

## 2016-04-03 DIAGNOSIS — I10 Essential (primary) hypertension: Secondary | ICD-10-CM | POA: Insufficient documentation

## 2016-04-03 DIAGNOSIS — Z7982 Long term (current) use of aspirin: Secondary | ICD-10-CM | POA: Diagnosis not present

## 2016-04-03 DIAGNOSIS — K449 Diaphragmatic hernia without obstruction or gangrene: Secondary | ICD-10-CM | POA: Diagnosis not present

## 2016-04-03 DIAGNOSIS — I700 Atherosclerosis of aorta: Secondary | ICD-10-CM | POA: Diagnosis not present

## 2016-04-03 DIAGNOSIS — Z79899 Other long term (current) drug therapy: Secondary | ICD-10-CM | POA: Diagnosis not present

## 2016-04-03 DIAGNOSIS — E785 Hyperlipidemia, unspecified: Secondary | ICD-10-CM | POA: Insufficient documentation

## 2016-04-03 LAB — BASIC METABOLIC PANEL
ANION GAP: 7 (ref 5–15)
BUN: 26 mg/dL — ABNORMAL HIGH (ref 6–20)
CALCIUM: 8.6 mg/dL — AB (ref 8.9–10.3)
CO2: 21 mmol/L — AB (ref 22–32)
Chloride: 113 mmol/L — ABNORMAL HIGH (ref 101–111)
Creatinine, Ser: 1.11 mg/dL — ABNORMAL HIGH (ref 0.44–1.00)
GFR, EST AFRICAN AMERICAN: 50 mL/min — AB (ref >60)
GFR, EST NON AFRICAN AMERICAN: 43 mL/min — AB (ref >60)
Glucose, Bld: 108 mg/dL — ABNORMAL HIGH (ref 65–99)
Potassium: 4.3 mmol/L (ref 3.5–5.1)
Sodium: 141 mmol/L (ref 135–145)

## 2016-04-03 LAB — CBC
HCT: 34.8 % — ABNORMAL LOW (ref 36.0–46.0)
HEMOGLOBIN: 11.2 g/dL — AB (ref 12.0–15.0)
MCH: 28.9 pg (ref 26.0–34.0)
MCHC: 32.2 g/dL (ref 30.0–36.0)
MCV: 89.7 fL (ref 78.0–100.0)
Platelets: 190 10*3/uL (ref 150–400)
RBC: 3.88 MIL/uL (ref 3.87–5.11)
RDW: 15.3 % (ref 11.5–15.5)
WBC: 4.2 10*3/uL (ref 4.0–10.5)

## 2016-04-03 LAB — HEPARIN LEVEL (UNFRACTIONATED): HEPARIN UNFRACTIONATED: 0.34 [IU]/mL (ref 0.30–0.70)

## 2016-04-03 LAB — TROPONIN I

## 2016-04-03 LAB — PROTIME-INR
INR: 0.91
Prothrombin Time: 12.3 seconds (ref 11.4–15.2)

## 2016-04-03 MED ORDER — ASPIRIN 81 MG PO CHEW
324.0000 mg | CHEWABLE_TABLET | Freq: Once | ORAL | Status: AC
  Administered 2016-04-03: 324 mg via ORAL
  Filled 2016-04-03: qty 4

## 2016-04-03 MED ORDER — AMLODIPINE BESYLATE 5 MG PO TABS
5.0000 mg | ORAL_TABLET | Freq: Every day | ORAL | Status: DC
  Administered 2016-04-03 – 2016-04-05 (×3): 5 mg via ORAL
  Filled 2016-04-03 (×3): qty 1

## 2016-04-03 MED ORDER — LABETALOL HCL 200 MG PO TABS
100.0000 mg | ORAL_TABLET | Freq: Once | ORAL | Status: AC
  Administered 2016-04-03: 100 mg via ORAL
  Filled 2016-04-03: qty 1

## 2016-04-03 MED ORDER — HEPARIN (PORCINE) IN NACL 100-0.45 UNIT/ML-% IJ SOLN
12.0000 [IU]/kg/h | INTRAMUSCULAR | Status: DC
  Administered 2016-04-03: 12 [IU]/kg/h via INTRAVENOUS
  Filled 2016-04-03: qty 250

## 2016-04-03 MED ORDER — ASPIRIN 325 MG PO TABS
325.0000 mg | ORAL_TABLET | Freq: Every day | ORAL | Status: DC
  Administered 2016-04-03 – 2016-04-05 (×3): 325 mg via ORAL
  Filled 2016-04-03 (×3): qty 1

## 2016-04-03 MED ORDER — LABETALOL HCL 5 MG/ML IV SOLN: 10.0000 mg | INTRAVENOUS | Status: DC | PRN

## 2016-04-03 MED ORDER — ONDANSETRON HCL 4 MG PO TABS: 4.0000 mg | ORAL_TABLET | Freq: Four times a day (QID) | ORAL | Status: DC | PRN

## 2016-04-03 MED ORDER — OXYBUTYNIN CHLORIDE 5 MG PO TABS
5.0000 mg | ORAL_TABLET | Freq: Every day | ORAL | Status: DC
  Administered 2016-04-03 – 2016-04-05 (×3): 5 mg via ORAL
  Filled 2016-04-03 (×3): qty 1

## 2016-04-03 MED ORDER — SIMVASTATIN 20 MG PO TABS
40.0000 mg | ORAL_TABLET | Freq: Every day | ORAL | Status: DC
  Administered 2016-04-03 – 2016-04-05 (×3): 40 mg via ORAL
  Filled 2016-04-03 (×3): qty 2

## 2016-04-03 MED ORDER — NITROGLYCERIN IN D5W 200-5 MCG/ML-% IV SOLN
5.0000 ug/min | Freq: Once | INTRAVENOUS | Status: AC
  Administered 2016-04-03: 5 ug/min via INTRAVENOUS
  Filled 2016-04-03: qty 250

## 2016-04-03 MED ORDER — ONDANSETRON HCL 4 MG/2ML IJ SOLN: 4.0000 mg | Freq: Four times a day (QID) | INTRAMUSCULAR | Status: DC | PRN

## 2016-04-03 MED ORDER — HEPARIN BOLUS VIA INFUSION
4000.0000 [IU] | Freq: Once | INTRAVENOUS | Status: AC
  Administered 2016-04-03: 4000 [IU] via INTRAVENOUS

## 2016-04-03 MED ORDER — PANTOPRAZOLE SODIUM 40 MG PO TBEC
40.0000 mg | DELAYED_RELEASE_TABLET | Freq: Every day | ORAL | Status: DC
  Administered 2016-04-04 – 2016-04-05 (×2): 40 mg via ORAL
  Filled 2016-04-03 (×3): qty 1

## 2016-04-03 MED ORDER — ASPIRIN EC 81 MG PO TBEC: 81.0000 mg | DELAYED_RELEASE_TABLET | Freq: Every day | ORAL | Status: DC

## 2016-04-03 MED ORDER — HEPARIN (PORCINE) IN NACL 100-0.45 UNIT/ML-% IJ SOLN: 700.0000 [IU]/h | INTRAMUSCULAR | Status: DC

## 2016-04-03 MED ORDER — SODIUM CHLORIDE 0.9% FLUSH
3.0000 mL | Freq: Two times a day (BID) | INTRAVENOUS | Status: DC
  Administered 2016-04-03 – 2016-04-05 (×4): 3 mL via INTRAVENOUS

## 2016-04-03 MED ORDER — LABETALOL HCL 200 MG PO TABS
100.0000 mg | ORAL_TABLET | Freq: Every day | ORAL | Status: DC
  Administered 2016-04-03 – 2016-04-05 (×2): 100 mg via ORAL
  Filled 2016-04-03 (×3): qty 1

## 2016-04-03 MED ORDER — AMLODIPINE BESYLATE 5 MG PO TABS
10.0000 mg | ORAL_TABLET | Freq: Once | ORAL | Status: AC
  Administered 2016-04-03: 10 mg via ORAL
  Filled 2016-04-03: qty 2

## 2016-04-03 MED ORDER — MORPHINE SULFATE (PF) 2 MG/ML IV SOLN: 2.0000 mg | INTRAVENOUS | Status: DC | PRN

## 2016-04-03 MED ORDER — NITROGLYCERIN 2 % TD OINT
1.0000 [in_us] | TOPICAL_OINTMENT | Freq: Once | TRANSDERMAL | Status: AC
  Administered 2016-04-03: 1 [in_us] via TOPICAL
  Filled 2016-04-03: qty 1

## 2016-04-03 NOTE — Progress Notes (Signed)
Patient requested BP be checked as infrequent as possible d/t her inability to tolerate the amount of pressure from the cuff. Explained the importance of frequent blood pressure checks while on nitroglycerin.  Patient stated she just couldn't do it. BP being monitored every hour to accommodate patient.

## 2016-04-03 NOTE — Progress Notes (Signed)
[place] for Heparin Indication: chest pain/ACS  Allergies  Allergen Reactions  . Naproxen Nausea Only and Other (See Comments)    Chest pain    Patient Measurements: Height: 5\' 2"  (157.5 cm) Weight: 153 lb (69.4 kg) IBW/kg (Calculated) : 50.1 Heparin Dosing Weight: 65 kg  Vital Signs: Temp: 97.4 F (36.3 C) (08/06 2000) Temp Source: Oral (08/06 2000) BP: 141/54 (08/06 2000) Pulse Rate: 59 (08/06 2000)  Labs:  Recent Labs  04/03/16 1147 04/03/16 1440 04/03/16 2007  HGB 11.2*  --   --   HCT 34.8*  --   --   PLT 190  --   --   LABPROT 12.3  --   --   INR 0.91  --   --   HEPARINUNFRC  --   --  0.34  CREATININE 1.11*  --   --   TROPONINI <0.03 <0.03 <0.03    Estimated Creatinine Clearance: 31.4 mL/min (by C-G formula based on SCr of 1.11 mg/dL).   Medical History: Past Medical History:  Diagnosis Date  . Hypertension     Medications:  Prescriptions Prior to Admission  Medication Sig Dispense Refill Last Dose  . amLODipine (NORVASC) 5 MG tablet Take 5 mg by mouth daily.   04/02/2016 at Unknown time  . aspirin EC 81 MG tablet Take 1 tablet (81 mg total) by mouth daily. 90 tablet 3 04/03/2016 at Unknown time  . labetalol (NORMODYNE) 100 MG tablet Take 100 mg by mouth daily.   04/02/2016 at Unknown time  . oxybutynin (DITROPAN) 5 MG tablet Take 5 mg by mouth daily.    04/02/2016 at Unknown time  . simvastatin (ZOCOR) 40 MG tablet Take 40 mg by mouth daily.   04/02/2016 at Unknown time  . acetaminophen (TYLENOL) 500 MG tablet Take 1,000 mg by mouth every 6 (six) hours as needed for mild pain or fever.    unknown    Assessment: 82 yo female admitted from ED with heparin infusion already started Heparin 4000 unit bolus and 850 units/hr rate Heparin level at goal   Goal of Therapy:  Heparin level 0.3-0.7 units/ml Monitor platelets by anticoagulation protocol: Yes   Plan:  Continue heparin infusion at 850 units/hr rate Heparin  level daily while on heparin Monitor CBC, signs of bleeding  Abner Greenspan, Oniya Mandarino Bennett 04/03/2016,9:02 PM

## 2016-04-03 NOTE — ED Notes (Signed)
Pt was given 1 baby ASA by friend.

## 2016-04-03 NOTE — ED Provider Notes (Signed)
[facility] DEPT Provider Note   CSN: [id number] Arrival date & time: 04/03/16  1129  First Provider Contact:  11:46 PM    By signing my name below, I, Rayna Sexton, attest that this documentation has been prepared under the direction and in the presence of Noemi Chapel, MD. Electronically Signed: Rayna Sexton, ED Scribe. 04/03/16. 12:09 PM.   History   Chief Complaint Chief Complaint  Patient presents with  . Chest Pain    HPI HPI Comments: Carol Duarte is a 82 y.o. female who presents to the Emergency Department by ambulance complaining of sudden onset, moderate, stabbing, centralized CP which began this morning. Pt states she began experiencing left sided CP 3 weeks ago in the middle of the night which radiated to her left axilla and lasted for about 1 hour and gradually alleviated. She denies any other CP until this morning. She was evaluated by her PCP and then her cardiologist earlier this week and has a stress test scheduled next week. Earlier this morning at church she began experiencing her CP and took 1 baby aspirin noting that her CP alleviated for some time and has gradually worsened again since arrival to the ED. She reports associated, mild, SOB and notes feeling disoriented during her CP. Pt has a hx of HTN and denies having taken her rx medication this morning (BP 220/110 mmHg). Pt denies a PMHx of cardiac issues. She denies n/v and diaphoresis.   The history is provided by the patient. No language interpreter was used.    Past Medical History:  Diagnosis Date  . Hypertension     Patient Active Problem List   Diagnosis Date Noted  . Unstable angina ([place]) 04/03/2016  . Bursitis, shoulder 07/10/2014    Past Surgical History:  Procedure Laterality Date  . APPENDECTOMY    . brain cyst removed    . CATARACT EXTRACTION W/PHACO Left 11/09/2015   Procedure: CATARACT EXTRACTION PHACO AND INTRAOCULAR LENS PLACEMENT LEFT EYE cde=8.97;  Surgeon: [name], MD;   Location: [facility];  Service: Ophthalmology;  Laterality: Left;  . TONSILLECTOMY AND ADENOIDECTOMY      OB History    No data available       Home Medications    Prior to Admission medications   Medication Sig Start Date End Date Taking? Authorizing Provider  amLODipine (NORVASC) 5 MG tablet Take 5 mg by mouth daily.   Yes Historical Provider, MD  aspirin EC 81 MG tablet Take 1 tablet (81 mg total) by mouth daily. 03/29/16  Yes Herminio Commons, MD  labetalol (NORMODYNE) 100 MG tablet Take 100 mg by mouth daily.   Yes Historical Provider, MD  oxybutynin (DITROPAN) 5 MG tablet Take 5 mg by mouth daily.    Yes Historical Provider, MD  simvastatin (ZOCOR) 40 MG tablet Take 40 mg by mouth daily.   Yes Historical Provider, MD  acetaminophen (TYLENOL) 500 MG tablet Take 1,000 mg by mouth every 6 (six) hours as needed for mild pain or fever.     Historical Provider, MD    Family History Family History  Problem Relation Age of Onset  . Heart failure Mother   . Heart failure Father     Social History Social History  Substance Use Topics  . Smoking status: Never Smoker  . Smokeless tobacco: Former Systems developer  . Alcohol use No     Allergies   Naproxen   Review of Systems Review of Systems  Constitutional: Negative for diaphoresis.  Respiratory: Positive for  shortness of breath.   Cardiovascular: Positive for chest pain.  Gastrointestinal: Negative for nausea and vomiting.  All other systems reviewed and are negative.  Physical Exam Updated Vital Signs BP (!) 228/85   Pulse 67   Temp 98.2 F (36.8 C) (Oral)   Resp 23   Ht 5\' 2"  (1.575 m)   Wt 153 lb (69.4 kg)   SpO2 100%   BMI 27.98 kg/m   Physical Exam  Constitutional: She is oriented to person, place, and time. She appears well-developed and well-nourished. No distress.  HENT:  Head: Normocephalic and atraumatic.  Eyes: EOM are normal.  Neck: Normal range of motion.  No JVD  Cardiovascular: Normal rate, regular  rhythm and normal heart sounds.  Exam reveals no gallop and no friction rub.   No murmur heard. Pulmonary/Chest: Effort normal and breath sounds normal. No respiratory distress. She has no wheezes. She has no rales.  Abdominal: Soft. She exhibits no distension. There is no tenderness.  Musculoskeletal: Normal range of motion. She exhibits no edema.  Neurological: She is alert and oriented to person, place, and time.  Skin: Skin is warm and dry.  Psychiatric: She has a normal mood and affect. Judgment normal.  Nursing note and vitals reviewed.  ED Treatments / Results  Labs (all labs ordered are listed, but only abnormal results are displayed) Labs Reviewed  BASIC METABOLIC PANEL - Abnormal; Notable for the following:       Result Value   Chloride 113 (*)    CO2 21 (*)    Glucose, Bld 108 (*)    BUN 26 (*)    Creatinine, Ser 1.11 (*)    Calcium 8.6 (*)    GFR calc non Af Amer 43 (*)    GFR calc Af Amer 50 (*)    All other components within normal limits  CBC - Abnormal; Notable for the following:    Hemoglobin 11.2 (*)    HCT 34.8 (*)    All other components within normal limits  TROPONIN I    EKG  EKG Interpretation  Date/Time:  Sunday April 03 2016 11:37:43 EDT Ventricular Rate:  60 PR Interval:    QRS Duration: 79 QT Interval:  404 QTC Calculation: 404 R Axis:   33 Text Interpretation:  Sinus rhythm Abnormal R-wave progression, early transition since last tracing no significant change Confirmed by Sabra Heck  MD, Loran Auguste ([id number]) on 04/03/2016 11:47:36 AM       Radiology Dg Chest 2 View  Result Date: 04/03/2016 CLINICAL DATA:  82 year old female with history of midsternal chest pain this morning while at church. EXAM: CHEST  2 VIEW COMPARISON:  Chest x-ray 11/20/2013. 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 Eventration of the right hemidiaphragm. Lung volumes are normal. No consolidative airspace disease.  No pleural effusions. No pneumothorax. No pulmonary nodule or mass noted. Pulmonary vasculature and the cardiomediastinal silhouette are within normal limits. Atherosclerosis in the thoracic aorta. IMPRESSION: 1.  No radiographic evidence of acute cardiopulmonary disease. 2. Aortic atherosclerosis. Electronically Signed   By: Vinnie Langton M.D.   On: 04/03/2016 12:29    Procedures Procedures  DIAGNOSTIC STUDIES: Oxygen Saturation is 96% on RA, normal by my interpretation.    COORDINATION OF CARE: 12:08 PM Discussed next steps with pt. Pt verbalized understanding and is agreeable with the plan.    Medications Ordered in ED Medications  labetalol (NORMODYNE) tablet 100 mg (100 mg Oral Given 04/03/16 1230)  amLODipine (  NORVASC) tablet 10 mg (10 mg Oral Given 04/03/16 1231)  aspirin chewable tablet 324 mg (324 mg Oral Given 04/03/16 1232)  nitroGLYCERIN (NITROGLYN) 2 % ointment 1 inch (1 inch Topical Given 04/03/16 1231)  nitroGLYCERIN 50 mg in dextrose 5 % 250 mL (0.2 mg/mL) infusion (5 mcg/min Intravenous New Bag/Given 04/03/16 1313)     Initial Impression / Assessment and Plan / ED Course  I have reviewed the triage vital signs and the nursing notes.  Pertinent labs & imaging results that were available during my care of the patient were reviewed by me and considered in my medical decision making (see chart for details).  Clinical Course  Comment By Time  Htn remains severely elevated despite taking her meds here - she has been given nitro paste and eventually nitro GTT b/c of the severe elevation with systolic as high as [id number] systolic.  With her recurrent CP - will add heparin and admit to hospital - step down - will need cardiac evaluation as this could be unstable angina.  D/w Dr,. Le who is in agreement Noemi Chapel, MD 08/06 1320    I personally performed the services described in this documentation, which was scribed in my presence. The recorded information has been reviewed and is  accurate.     Final Clinical Impressions(s) / ED Diagnoses   Final diagnoses:  Unstable angina ([place])   CRITICAL CARE Performed by: Johnna Acosta Total critical care time: 35 minutes Critical care time was exclusive of separately billable procedures and treating other patients. Critical care was necessary to treat or prevent imminent or life-threatening deterioration. Critical care was time spent personally by me on the following activities: development of treatment plan with patient and/or surrogate as well as nursing, discussions with consultants, evaluation of patient's response to treatment, examination of patient, obtaining history from patient or surrogate, ordering and performing treatments and interventions, ordering and review of laboratory studies, ordering and review of radiographic studies, pulse oximetry and re-evaluation of patient's condition.    Noemi Chapel, MD 04/03/16 1323

## 2016-04-03 NOTE — H&P (Signed)
[facility] History and Physical  Carol Duarte [id number] DOB: 1925-01-12    PCP:   [name], MD   Chief Complaint: chest pain.   HPI: Carol Duarte is an 82 y.o. female with hx of HLD, HTN, presented to the ER with retrosternal CP radiating to her left arm.  She also had another episode of CP about 2 weeks ago, and she was sent to see Dr Roxy Horseman of cardiology service.  He was concerned about ischemic cardiomyopathy, continued her ASA, and scheduled for a nuclear stress test in about 4 days.  At church today, while at rest, she has retrosternal CP, with no SOB, nausea, vomiting or diaphoresis.  She has had no epigastric pain, melena, or rectal bleeding.  Work up in the ER showed negative initial troponin, clear CXR, and unremarkable EKG.  She was found to have severe HTN with SBP over 200's.  She was started on IV NTG and initiate her home meds with labetelol.  NTG drip was subsequently started, and hospitalist was asked to admit her for unstable angina.   Rewiew of Systems:  Constitutional: Negative for malaise, fever and chills. No significant weight loss or weight gain Eyes: Negative for eye pain, redness and discharge, diplopia, visual changes, or flashes of light. ENMT: Negative for ear pain, hoarseness, nasal congestion, sinus pressure and sore throat. No headaches; tinnitus, drooling, or problem swallowing. Cardiovascular: Negative for  palpitations, diaphoresis, dyspnea and peripheral edema. ; No orthopnea, PND Respiratory: Negative for cough, hemoptysis, wheezing and stridor. No pleuritic chestpain. Gastrointestinal: Negative for nausea, vomiting, diarrhea, constipation, abdominal pain, melena, blood in stool, hematemesis, jaundice and rectal bleeding.    Genitourinary: Negative for frequency, dysuria, incontinence,flank pain and hematuria; Musculoskeletal: Negative for back pain and neck pain. Negative for swelling and trauma.;  Skin: . Negative for pruritus, rash,  abrasions, bruising and skin lesion.; ulcerations Neuro: Negative for headache, lightheadedness and neck stiffness. Negative for weakness, altered level of consciousness , altered mental status, extremity weakness, burning feet, involuntary movement, seizure and syncope.  Psych: negative for anxiety, depression, insomnia, tearfulness, panic attacks, hallucinations, paranoia, suicidal or homicidal ideation    Past Medical History:  Diagnosis Date  . Hypertension     Past Surgical History:  Procedure Laterality Date  . APPENDECTOMY    . brain cyst removed    . CATARACT EXTRACTION W/PHACO Left 11/09/2015   Procedure: CATARACT EXTRACTION PHACO AND INTRAOCULAR LENS PLACEMENT LEFT EYE cde=8.97;  Surgeon: [name], MD;  Location: [facility];  Service: Ophthalmology;  Laterality: Left;  . TONSILLECTOMY AND ADENOIDECTOMY      Medications:  HOME MEDS: Prior to Admission medications   Medication Sig Start Date End Date Taking? Authorizing Provider  amLODipine (NORVASC) 5 MG tablet Take 5 mg by mouth daily.   Yes Historical Provider, MD  aspirin EC 81 MG tablet Take 1 tablet (81 mg total) by mouth daily. 03/29/16  Yes Herminio Commons, MD  labetalol (NORMODYNE) 100 MG tablet Take 100 mg by mouth daily.   Yes Historical Provider, MD  oxybutynin (DITROPAN) 5 MG tablet Take 5 mg by mouth daily.    Yes Historical Provider, MD  simvastatin (ZOCOR) 40 MG tablet Take 40 mg by mouth daily.   Yes Historical Provider, MD  acetaminophen (TYLENOL) 500 MG tablet Take 1,000 mg by mouth every 6 (six) hours as needed for mild pain or fever.     Historical Provider, MD     Allergies:  Allergies  Allergen Reactions  . Naproxen Nausea  Only and Other (See Comments)    Chest pain    Social History:   reports that she has never smoked. She has quit using smokeless tobacco. She reports that she does not drink alcohol or use drugs.  Family History: Family History  Problem Relation Age of Onset  . Heart  failure Mother   . Heart failure Father      Physical Exam: Vitals:   04/03/16 1330 04/03/16 1431 04/03/16 1438 04/03/16 1500  BP: 179/77   (!) 145/133  Pulse: (!) 59   61  Resp: 15   16  Temp:   97 F (36.1 C)   TempSrc:   Oral   SpO2: 99%   97%  Weight:      Height:  5\' 2"  (1.575 m)     Blood pressure (!) 145/133, pulse 61, temperature 97 F (36.1 C), temperature source Oral, resp. rate 16, height 5\' 2"  (1.575 m), weight 69.4 kg (153 lb), SpO2 97 %.  GEN:  Pleasant  patient lying in the stretcher in no acute distress; cooperative with exam. PSYCH:  alert and oriented x4; does not appear anxious or depressed; affect is appropriate. HEENT: Mucous membranes pink and anicteric; PERRLA; EOM intact; no cervical lymphadenopathy nor thyromegaly or carotid bruit; no JVD; There were no stridor. Neck is very supple. Breasts:: Not examined CHEST [place]: No tenderness CHEST: Normal respiration, clear to auscultation bilaterally.  HEART: Regular rate and rhythm.  There are no murmur, rub, or gallops.   BACK: No kyphosis or scoliosis; no CVA tenderness ABDOMEN: soft and non-tender; no masses, no organomegaly, normal abdominal bowel sounds; no pannus; no intertriginous candida. There is no rebound and no distention. Rectal Exam: Not done EXTREMITIES: No bone or joint deformity; age-appropriate arthropathy of the hands and knees; no edema; no ulcerations.  There is no calf tenderness. Genitalia: not examined PULSES: 2+ and symmetric SKIN: Normal hydration no rash or ulceration CNS: Cranial nerves 2-12 grossly intact no focal lateralizing neurologic deficit.  Speech is fluent; uvula elevated with phonation, facial symmetry and tongue midline. DTR are normal bilaterally, cerebella exam is intact, barbinski is negative and strengths are equaled bilaterally.  No sensory loss.   Labs on Admission:  Basic Metabolic Panel:  Recent Labs Lab 04/03/16 1147  NA 141  K 4.3  CL 113*  CO2 21*   GLUCOSE 108*  BUN 26*  CREATININE 1.11*  CALCIUM 8.6*   CBC:  Recent Labs Lab 04/03/16 1147  WBC 4.2  HGB 11.2*  HCT 34.8*  MCV 89.7  PLT 190   Cardiac Enzymes:  Recent Labs Lab 04/03/16 1147 04/03/16 1440  TROPONINI <0.03 <0.03   Radiological Exams on Admission: Dg Chest 2 View  Result Date: 04/03/2016 CLINICAL DATA:  82 year old female with history of midsternal chest pain this morning while at church. EXAM: CHEST  2 VIEW COMPARISON:  Chest x-ray 11/20/2013. 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 Eventration of the right hemidiaphragm. Lung volumes are normal. No consolidative airspace disease. No pleural effusions. No pneumothorax. No pulmonary nodule or mass noted. Pulmonary vasculature and the cardiomediastinal silhouette are within normal limits. Atherosclerosis in the thoracic aorta. IMPRESSION: 1.  No radiographic evidence of acute cardiopulmonary disease. 2. Aortic atherosclerosis. Electronically Signed   By: Vinnie Langton M.D.   On: 04/03/2016 12:29    EKG: Independently reviewed.    Assessment/Plan Present on Admission: . Unstable angina ([place]) . HTN (hypertension) . HLD (hyperlipidemia)  PLAN:  I am not certain, but this could be unstable angina.  She certainly has enough cardiac risk factors.  Will start IV Heparin, and continue with NTG.  Use IV labetalol PRN, and continue her oral labatalol.  She will be given ASA, along with her statin.  Will make her NPO after midnight, and consult cardiology in the morning.  Will cycle her troponins.  HTN:  Continue with meds.  Follow BP closely.  HLD:  Continue with her statin.    Other plans as per orders. Code Status: FULL Haskel Khan, MD. FACP [facility] Pager [phone] 7pm to 7am.  04/03/2016, 3:49 PM

## 2016-04-03 NOTE — ED Triage Notes (Signed)
Pt comes in by EMS for chest pain. She states she was at church and began having central chest pain. Pt states she had a episode similar to this within the last 3 weeks. Since then she has followed up with a cardiac doctor and is to be getting a stress test on Wednesday. Pt alert and oriented with no pain at this time.

## 2016-04-03 NOTE — Progress Notes (Signed)
ANTICOAGULATION CONSULT NOTE - Initial Consult  Pharmacy Consult for Heparin Indication: chest pain/ACS  Allergies  Allergen Reactions  . Naproxen Nausea Only and Other (See Comments)    Chest pain    Patient Measurements: Height: 5\' 2"  (157.5 cm) Weight: 153 lb (69.4 kg) IBW/kg (Calculated) : 50.1 Heparin Dosing Weight: 65 kg  Vital Signs: Temp: 97 F (36.1 C) (08/06 1438) Temp Source: Oral (08/06 1438) BP: 179/77 (08/06 1330) Pulse Rate: 59 (08/06 1330)  Labs:  Recent Labs  04/03/16 1147  HGB 11.2*  HCT 34.8*  PLT 190  CREATININE 1.11*  TROPONINI <0.03    Estimated Creatinine Clearance: 31.4 mL/min (by C-G formula based on SCr of 1.11 mg/dL).   Medical History: Past Medical History:  Diagnosis Date  . Hypertension     Medications:  Prescriptions Prior to Admission  Medication Sig Dispense Refill Last Dose  . amLODipine (NORVASC) 5 MG tablet Take 5 mg by mouth daily.   04/02/2016 at Unknown time  . aspirin EC 81 MG tablet Take 1 tablet (81 mg total) by mouth daily. 90 tablet 3 04/03/2016 at Unknown time  . labetalol (NORMODYNE) 100 MG tablet Take 100 mg by mouth daily.   04/02/2016 at Unknown time  . oxybutynin (DITROPAN) 5 MG tablet Take 5 mg by mouth daily.    04/02/2016 at Unknown time  . simvastatin (ZOCOR) 40 MG tablet Take 40 mg by mouth daily.   04/02/2016 at Unknown time  . acetaminophen (TYLENOL) 500 MG tablet Take 1,000 mg by mouth every 6 (six) hours as needed for mild pain or fever.    unknown    Assessment: 82 yo female admitted from ED with heparin infusion already started Heparin 4000 unit bolus and 850 units/hr rate  Goal of Therapy:  Heparin level 0.3-0.7 units/ml Monitor platelets by anticoagulation protocol: Yes   Plan:  Continue heparin infusion at 850 units/hr rate Heparin level in 8 hours and daily while on heparin Monitor CBC, signs of bleeding  Abner Greenspan, Holdyn Poyser Bennett 04/03/2016,2:39 PM

## 2016-04-03 NOTE — Progress Notes (Signed)
Notified physician of patient's blood pressure 107/43 and no chest pain at this time. MD dc'd nitroglycerin. New orders received and carried out.

## 2016-04-04 ENCOUNTER — Observation Stay (HOSPITAL_COMMUNITY): Payer: Medicare Other

## 2016-04-04 ENCOUNTER — Observation Stay (HOSPITAL_BASED_OUTPATIENT_CLINIC_OR_DEPARTMENT_OTHER): Payer: Medicare Other

## 2016-04-04 DIAGNOSIS — I700 Atherosclerosis of aorta: Secondary | ICD-10-CM | POA: Diagnosis not present

## 2016-04-04 DIAGNOSIS — I10 Essential (primary) hypertension: Secondary | ICD-10-CM | POA: Diagnosis not present

## 2016-04-04 DIAGNOSIS — R079 Chest pain, unspecified: Secondary | ICD-10-CM | POA: Diagnosis not present

## 2016-04-04 DIAGNOSIS — E785 Hyperlipidemia, unspecified: Secondary | ICD-10-CM | POA: Diagnosis not present

## 2016-04-04 DIAGNOSIS — I200 Unstable angina: Secondary | ICD-10-CM | POA: Diagnosis not present

## 2016-04-04 DIAGNOSIS — R0789 Other chest pain: Secondary | ICD-10-CM | POA: Diagnosis not present

## 2016-04-04 LAB — ECHOCARDIOGRAM COMPLETE
AO mean calculated velocity dopler: 118 cm/s
AOPV: 0.92 m/s
AOVTI: 45.3 cm
AV Area VTI: 1.84 cm2
AV Peak grad: 13 mmHg
AV VEL mean LVOT/AV: 0.9
AV area mean vel ind: 1.02 cm2/m2
AVAREAMEANV: 1.81 cm2
AVAREAVTIIND: 0.98 cm2/m2
AVG: 6 mmHg
AVLVOTPG: 11 mmHg
AVPKVEL: 178 cm/s
CHL CUP AV PEAK INDEX: 1.04
CHL CUP AV VEL: 1.73
CHL CUP DOP CALC LVOT VTI: 39.1 cm
EERAT: 22.52
EWDT: 306 ms
FS: 43 % (ref 28–44)
HEIGHTINCHES: 62 in
IVS/LV PW RATIO, ED: 0.99
LA ID, A-P, ES: 33 mm
LA diam end sys: 33 mm
LA vol index: 27.1 mL/m2
LA vol: 47.9 mL
LADIAMINDEX: 1.87 cm/m2
LAVOLA4C: 45.4 mL
LV E/e'average: 22.52
LV SIMPSON'S DISK: 60
LV TDI E'LATERAL: 5.55
LV TDI E'MEDIAL: 5.66
LV sys vol: 16 mL (ref 14–42)
LVDIAVOL: 41 mL — AB (ref 46–106)
LVDIAVOLIN: 23 mL/m2
LVEEMED: 22.52
LVELAT: 5.55 cm/s
LVOT area: 2.01 cm2
LVOT diameter: 16 mm
LVOT peak VTI: 0.86 cm
LVOTPV: 163 cm/s
LVOTSV: 79 mL
LVSYSVOLIN: 9 mL/m2
MV Dec: 306
MV Peak grad: 6 mmHg
MV pk E vel: 125 m/s
MVPKAVEL: 116 m/s
PW: 10.3 mm — AB (ref 0.6–1.1)
RV LATERAL S' VELOCITY: 16.8 cm/s
RV TAPSE: 25.2 mm
Stroke v: 25 ml
Valve area index: 0.98
Valve area: 1.73 cm2
Weight: 2455.04 oz

## 2016-04-04 LAB — NM MYOCAR MULTI W/SPECT W/WALL MOTION / EF
CHL CUP NUCLEAR SDS: 0
CHL CUP NUCLEAR SSS: 1
CSEPPHR: 78 {beats}/min
LHR: 0.21
LV dias vol: 42 mL (ref 46–106)
LV sys vol: 6 mL
Rest HR: 52 {beats}/min
SRS: 1
TID: 1.06

## 2016-04-04 LAB — HEPARIN LEVEL (UNFRACTIONATED)
Heparin Unfractionated: 0.96 IU/mL — ABNORMAL HIGH (ref 0.30–0.70)
Heparin Unfractionated: 0.57 IU/mL (ref 0.30–0.70)

## 2016-04-04 LAB — CBC
HEMATOCRIT: 35.8 % — AB (ref 36.0–46.0)
HEMOGLOBIN: 11.5 g/dL — AB (ref 12.0–15.0)
MCH: 29 pg (ref 26.0–34.0)
MCHC: 32.1 g/dL (ref 30.0–36.0)
MCV: 90.2 fL (ref 78.0–100.0)
Platelets: 196 10*3/uL (ref 150–400)
RBC: 3.97 MIL/uL (ref 3.87–5.11)
RDW: 15.4 % (ref 11.5–15.5)
WBC: 5.4 10*3/uL (ref 4.0–10.5)

## 2016-04-04 LAB — TROPONIN I: Troponin I: <0.03 ng/mL (ref <0.03)

## 2016-04-04 LAB — MRSA PCR SCREENING: MRSA by PCR: POSITIVE — AB

## 2016-04-04 MED ORDER — CHLORHEXIDINE GLUCONATE CLOTH 2 % EX PADS
6.0000 | MEDICATED_PAD | Freq: Every day | CUTANEOUS | Status: DC
  Administered 2016-04-04 – 2016-04-05 (×2): 6 via TOPICAL

## 2016-04-04 MED ORDER — REGADENOSON 0.4 MG/5ML IV SOLN
INTRAVENOUS | Status: AC
  Administered 2016-04-04: 0.4 mg via INTRAVENOUS
  Filled 2016-04-04: qty 5

## 2016-04-04 MED ORDER — SODIUM CHLORIDE 0.9% FLUSH
INTRAVENOUS | Status: AC
  Administered 2016-04-04: 10 mL via INTRAVENOUS
  Filled 2016-04-04: qty 10

## 2016-04-04 MED ORDER — TECHNETIUM TC 99M TETROFOSMIN IV KIT
30.0000 | PACK | Freq: Once | INTRAVENOUS | Status: AC | PRN
  Administered 2016-04-04: 30 via INTRAVENOUS

## 2016-04-04 MED ORDER — TECHNETIUM TC 99M TETROFOSMIN IV KIT
10.0000 | PACK | Freq: Once | INTRAVENOUS | Status: AC | PRN
  Administered 2016-04-04: 9 via INTRAVENOUS

## 2016-04-04 MED ORDER — HEPARIN SODIUM (PORCINE) 5000 UNIT/ML IJ SOLN
5000.0000 [IU] | Freq: Three times a day (TID) | INTRAMUSCULAR | Status: DC
Start: 2016-04-04 — End: 2016-04-05
  Administered 2016-04-04 – 2016-04-05 (×2): 5000 [IU] via SUBCUTANEOUS
  Filled 2016-04-04 (×2): qty 1

## 2016-04-04 MED ORDER — MUPIROCIN 2 % EX OINT
1.0000 "application " | TOPICAL_OINTMENT | Freq: Two times a day (BID) | CUTANEOUS | Status: DC
  Administered 2016-04-04 – 2016-04-05 (×3): 1 via NASAL
  Filled 2016-04-04: qty 22

## 2016-04-04 NOTE — Progress Notes (Addendum)
[place] for Heparin Indication: chest pain/ACS  Allergies  Allergen Reactions  . Naproxen Nausea Only and Other (See Comments)    Chest pain    Patient Measurements: Height: 5\' 2"  (157.5 cm) Weight: 153 lb 7 oz (69.6 kg) IBW/kg (Calculated) : 50.1 Heparin Dosing Weight: 65 kg  Vital Signs: Temp: 97.1 F (36.2 C) (08/07 0400) Temp Source: Oral (08/07 0400) BP: 144/89 (08/07 0400) Pulse Rate: 55 (08/07 0400)  Labs:  Recent Labs  04/03/16 1147 04/03/16 1440 04/03/16 2007 04/04/16 0259 04/04/16 0517  HGB 11.2*  --   --   --  11.5*  HCT 34.8*  --   --   --  35.8*  PLT 190  --   --   --  196  LABPROT 12.3  --   --   --   --   INR 0.91  --   --   --   --   HEPARINUNFRC  --   --  0.34  --  0.96*  CREATININE 1.11*  --   --   --   --   TROPONINI <0.03 <0.03 <0.03 <0.03  --     Estimated Creatinine Clearance: 31.4 mL/min (by C-G formula based on SCr of 1.11 mg/dL).   Medical History: Past Medical History:  Diagnosis Date  . Hypertension     Medications:  Prescriptions Prior to Admission  Medication Sig Dispense Refill Last Dose  . amLODipine (NORVASC) 5 MG tablet Take 5 mg by mouth daily.   04/02/2016 at Unknown time  . aspirin EC 81 MG tablet Take 1 tablet (81 mg total) by mouth daily. 90 tablet 3 04/03/2016 at Unknown time  . labetalol (NORMODYNE) 100 MG tablet Take 100 mg by mouth daily.   04/02/2016 at Unknown time  . oxybutynin (DITROPAN) 5 MG tablet Take 5 mg by mouth daily.    04/02/2016 at Unknown time  . simvastatin (ZOCOR) 40 MG tablet Take 40 mg by mouth daily.   04/02/2016 at Unknown time  . acetaminophen (TYLENOL) 500 MG tablet Take 1,000 mg by mouth every 6 (six) hours as needed for mild pain or fever.    unknown    Assessment: 82 yo female on heparin for CP  Her heparin level this am is elevated.  Infusing properly per RN.     Goal of Therapy:  Heparin level 0.3-0.7 units/ml Monitor platelets by anticoagulation  protocol: Yes   Plan:  Decrease heparin infusion to 700 units/hr  Check heparin level in ~ 8 hours Heparin level daily while on heparin Monitor CBC, signs of bleeding  Rameen Quinney Poteet 04/04/2016,8:05 AM  Addum:  Heparin level is therapeutic.  Cont drip at 700 units/hr.  F/u am labs

## 2016-04-04 NOTE — Progress Notes (Addendum)
Subjective: No more chest pain since yesterday. troponins negative. She says she feels better. She was scheduled for nuclear stress test in 2 days.  Objective: Vital signs in last 24 hours: Temp:  [97 F (36.1 C)-98.2 F (36.8 C)] 97.1 F (36.2 C) (08/07 0400) Pulse Rate:  [55-67] 55 (08/07 0400) Resp:  [15-23] 15 (08/07 0400) BP: (99-228)/(43-133) 144/89 (08/07 0400) SpO2:  [95 %-100 %] 95 % (08/07 0400) Weight:  [69.4 kg (153 lb)-69.6 kg (153 lb 7 oz)] 69.6 kg (153 lb 7 oz) (08/07 0500) Weight change:  Last BM Date: 04/02/16  Intake/Output from previous day: 08/06 0701 - 08/07 0700 In: 44.6 [I.V.:44.6] Out: 300 [Urine:300]  PHYSICAL EXAM General appearance: alert, cooperative and no distress Resp: clear to auscultation bilaterally Cardio: regular rate and rhythm, S1, S2 normal, no murmur, click, rub or gallop GI: soft, non-tender; bowel sounds normal; no masses,  no organomegaly Extremities: extremities normal, atraumatic, no cyanosis or edema  Lab Results:  Results for orders placed or performed during the hospital encounter of 04/03/16 (from the past 48 hour(s))  Basic metabolic panel     Status: Abnormal   Collection Time: 04/03/16 11:47 AM  Result Value Ref Range   Sodium 141 135 - 145 mmol/L   Potassium 4.3 3.5 - 5.1 mmol/L   Chloride 113 (H) 101 - 111 mmol/L   CO2 21 (L) 22 - 32 mmol/L   Glucose, Bld 108 (H) 65 - 99 mg/dL   BUN 26 (H) 6 - 20 mg/dL   Creatinine, Ser 1.11 (H) 0.44 - 1.00 mg/dL   Calcium 8.6 (L) 8.9 - 10.3 mg/dL   GFR calc non Af Amer 43 (L) >60 mL/min   GFR calc Af Amer 50 (L) >60 mL/min    Comment: (NOTE) The eGFR has been calculated using the CKD EPI equation. This calculation has not been validated in all clinical situations. eGFR's persistently <60 mL/min signify possible Chronic Kidney Disease.    Anion gap 7 5 - 15  CBC     Status: Abnormal   Collection Time: 04/03/16 11:47 AM  Result Value Ref Range   WBC 4.2 4.0 - 10.5 K/uL   RBC  3.88 3.87 - 5.11 MIL/uL   Hemoglobin 11.2 (L) 12.0 - 15.0 g/dL   HCT 34.8 (L) 36.0 - 46.0 %   MCV 89.7 78.0 - 100.0 fL   MCH 28.9 26.0 - 34.0 pg   MCHC 32.2 30.0 - 36.0 g/dL   RDW 15.3 11.5 - 15.5 %   Platelets 190 150 - 400 K/uL  Troponin I     Status: None   Collection Time: 04/03/16 11:47 AM  Result Value Ref Range   Troponin I <0.03 <0.03 ng/mL  Protime-INR     Status: None   Collection Time: 04/03/16 11:47 AM  Result Value Ref Range   Prothrombin Time 12.3 11.4 - 15.2 seconds   INR 0.91   Troponin I     Status: None   Collection Time: 04/03/16  2:40 PM  Result Value Ref Range   Troponin I <0.03 <0.03 ng/mL  Troponin I     Status: None   Collection Time: 04/03/16  8:07 PM  Result Value Ref Range   Troponin I <0.03 <0.03 ng/mL  Heparin level (unfractionated)     Status: None   Collection Time: 04/03/16  8:07 PM  Result Value Ref Range   Heparin Unfractionated 0.34 0.30 - 0.70 IU/mL    Comment:          IF HEPARIN RESULTS ARE BELOW EXPECTED VALUES, AND PATIENT DOSAGE HAS BEEN CONFIRMED, SUGGEST FOLLOW UP TESTING OF ANTITHROMBIN III LEVELS.   Troponin I     Status: None   Collection Time: 04/04/16  2:59 AM  Result Value Ref Range   Troponin I <0.03 <0.03 ng/mL  Heparin level (unfractionated)     Status: Abnormal   Collection Time: 04/04/16  5:17 AM  Result Value Ref Range   Heparin Unfractionated 0.96 (H) 0.30 - 0.70 IU/mL    Comment:        IF HEPARIN RESULTS ARE BELOW EXPECTED VALUES, AND PATIENT DOSAGE HAS BEEN CONFIRMED, SUGGEST FOLLOW UP TESTING OF ANTITHROMBIN III LEVELS.   CBC     Status: Abnormal   Collection Time: 04/04/16  5:17 AM  Result Value Ref Range   WBC 5.4 4.0 - 10.5 K/uL   RBC 3.97 3.87 - 5.11 MIL/uL   Hemoglobin 11.5 (L) 12.0 - 15.0 g/dL   HCT 35.8 (L) 36.0 - 46.0 %   MCV 90.2 78.0 - 100.0 fL   MCH 29.0 26.0 - 34.0 pg   MCHC 32.1 30.0 - 36.0 g/dL   RDW 15.4 11.5 - 15.5 %   Platelets 196 150 - 400 K/uL    ABGS No results for  input(s): PHART, PO2ART, TCO2, HCO3 in the last 72 hours.  Invalid input(s): PCO2 CULTURES No results found for this or any previous visit (from the past 240 hour(s)). Studies/Results: Dg Chest 2 View  Result Date: 04/03/2016 CLINICAL DATA:  82-year-old female with history of midsternal chest pain this morning while at church. EXAM: CHEST  2 VIEW COMPARISON:  Chest x-ray 11/20/2013. 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 Eventration of the right hemidiaphragm. Lung volumes are normal. No consolidative airspace disease. No pleural effusions. No pneumothorax. No pulmonary nodule or mass noted. Pulmonary vasculature and the cardiomediastinal silhouette are within normal limits. Atherosclerosis in the thoracic aorta. IMPRESSION: 1.  No radiographic evidence of acute cardiopulmonary disease. 2. Aortic atherosclerosis. Electronically Signed   By: Daniel  Entrikin M.D.   On: 04/03/2016 12:29    Medications:  Prior to Admission:  Prescriptions Prior to Admission  Medication Sig Dispense Refill Last Dose  . amLODipine (NORVASC) 5 MG tablet Take 5 mg by mouth daily.   04/02/2016 at Unknown time  . aspirin EC 81 MG tablet Take 1 tablet (81 mg total) by mouth daily. 90 tablet 3 04/03/2016 at Unknown time  . labetalol (NORMODYNE) 100 MG tablet Take 100 mg by mouth daily.   04/02/2016 at Unknown time  . oxybutynin (DITROPAN) 5 MG tablet Take 5 mg by mouth daily.    04/02/2016 at Unknown time  . simvastatin (ZOCOR) 40 MG tablet Take 40 mg by mouth daily.   04/02/2016 at Unknown time  . acetaminophen (TYLENOL) 500 MG tablet Take 1,000 mg by mouth every 6 (six) hours as needed for mild pain or fever.    unknown   Scheduled: . amLODipine  5 mg Oral Daily  . aspirin  325 mg Oral Daily  . labetalol  100 mg Oral Daily  . oxybutynin  5 mg Oral Daily  . pantoprazole  40 mg Oral Q0600  . simvastatin  40 mg Oral Daily  . sodium chloride flush  3 mL  Intravenous Q12H   Continuous: . heparin 850 Units/hr (04/03/16 1445)   PRN:labetalol, morphine injection, ondansetron **OR** ondansetron (ZOFRAN) IV  Assesment:She was admitted with chest pain/unstable angina. Her blood   pressure was not controlled. She has multiple cardiac risk factors. She has been scheduled for a nuclear stress test later this week. Principal Problem:   Unstable angina ([facility]) Active Problems:   HTN (hypertension)   HLD (hyperlipidemia)    Plan: Cardiology consultation potential stress test today depending on cardiology opinion. I ordered echocardiogram as I don't see one in her record    LOS: 0 days   HAWKINS,EDWARD L 04/04/2016, 7:42 AM  

## 2016-04-04 NOTE — Care Management Note (Signed)
Case Management Note  Patient Details  Name: Ted Alejandro MRN: [id number] Date of Birth: [birth year]  Subjective/Objective:                  Pt admitted for r/o CP. Pt is from home, lives with her daughter and is ind with ADL's. Pt has no DME and no HH services prior to admission. Pt's PCP is Dr. Legrand Rams, she drives herself to appointments. She has insurance with medication coverage. Pt plans to return home to her daughters house at discharge.   Action/Plan: No CM needs anticipated.   Expected Discharge Date:     04/05/2016             Expected Discharge Plan:  Home/Self Care  In-House Referral:  NA  Discharge planning Services  CM Consult  Post Acute Care Choice:  NA Choice offered to:  NA  DME Arranged:    DME Agency:     HH Arranged:    HH Agency:     Status of Service:  Completed, signed off  If discussed at [employer] of Stay Meetings, dates discussed:    Additional Comments:  Sherald Barge, RN 04/04/2016, 3:04 PM

## 2016-04-04 NOTE — Consult Note (Signed)
Primary cardiologist: Dr Kate Sable Consulting cardiologist: Dr Carlyle Dolly Requesting physician: Dr Sinda Du Indication: chest pain  Clinical Summary Carol Duarte is a 82 y.o.female history of HTN, HL admitted with chest pain. Seen by Dr Bronson Ing in clinic 03/29/16 for symptoms, she was referred to have a nuclear stress test which is scheduled for later this week. She reports 2 episodes of chest pain over the last 3 weeks. Both occurred at rest. Stabbing pain mid to left chest, 4-10/10. Most recent episode yesterday at church. +SOB. Not positional. Lasted approx 10 minutes.    K 4.3, Cr 1.11, Hgb 11.2, Plt 190,  Trop neg x3.  CXR no acute process Echo pending  Allergies  Allergen Reactions  . Naproxen Nausea Only and Other (See Comments)    Chest pain    Medications Scheduled Medications: . amLODipine  5 mg Oral Daily  . aspirin  325 mg Oral Daily  . labetalol  100 mg Oral Daily  . oxybutynin  5 mg Oral Daily  . pantoprazole  40 mg Oral Q0600  . simvastatin  40 mg Oral Daily  . sodium chloride flush  3 mL Intravenous Q12H     Infusions: . heparin 700 Units/hr (04/04/16 0800)     PRN Medications:  labetalol, morphine injection, ondansetron **OR** ondansetron (ZOFRAN) IV   Past Medical History:  Diagnosis Date  . Hypertension     Past Surgical History:  Procedure Laterality Date  . APPENDECTOMY    . brain cyst removed    . CATARACT EXTRACTION W/PHACO Left 11/09/2015   Procedure: CATARACT EXTRACTION PHACO AND INTRAOCULAR LENS PLACEMENT LEFT EYE cde=8.97;  Surgeon: Tonny Ariana Cavenaugh, MD;  Location: [facility];  Service: Ophthalmology;  Laterality: Left;  . TONSILLECTOMY AND ADENOIDECTOMY      Family History  Problem Relation Age of Onset  . Heart failure Mother   . Heart failure Father     Social History Ms. Heiny reports that she has never smoked. She has quit using smokeless tobacco. Ms. Nakamoto reports that she does not drink alcohol.  Review of  Systems CONSTITUTIONAL: No weight loss, fever, chills, weakness or fatigue.  HEENT: Eyes: No visual loss, blurred vision, double vision or yellow sclerae. No hearing loss, sneezing, congestion, runny nose or sore throat.  SKIN: No rash or itching.  CARDIOVASCULAR: per HPI RESPIRATORY: per HPI GASTROINTESTINAL: No anorexia, nausea, vomiting or diarrhea. No abdominal pain or blood.  GENITOURINARY: no polyuria, no dysuria NEUROLOGICAL: No headache, dizziness, syncope, paralysis, ataxia, numbness or tingling in the extremities. No change in bowel or bladder control.  MUSCULOSKELETAL: No muscle, back pain, joint pain or stiffness.  HEMATOLOGIC: No anemia, bleeding or bruising.  LYMPHATICS: No enlarged nodes. No history of splenectomy.  PSYCHIATRIC: No history of depression or anxiety.      Physical Examination Blood pressure (!) 141/48, pulse (!) 53, temperature 97 F (36.1 C), temperature source Oral, resp. rate 17, height 5\' 2"  (1.575 m), weight 153 lb 7 oz (69.6 kg), SpO2 95 %.  Intake/Output Summary (Last 24 hours) at 04/04/16 0925 Last data filed at 04/04/16 0800  Gross per 24 hour  Intake           146.62 ml  Output              300 ml  Net          -153.38 ml    HEENT: sclera clear, throat clear  Cardiovascular: RRR, no m/r/g, no jvd  Respiratory: CTAB  GI: abdomen soft, NT, ND  MSK: no LE edema  Neuro: no focal defitis  Psych: appropriate affect   Lab Results  Basic Metabolic Panel:  Recent Labs Lab 04/03/16 1147  NA 141  K 4.3  CL 113*  CO2 21*  GLUCOSE 108*  BUN 26*  CREATININE 1.11*  CALCIUM 8.6*    Liver Function Tests: No results for input(s): AST, ALT, ALKPHOS, BILITOT, PROT, ALBUMIN in the last 168 hours.  CBC:  Recent Labs Lab 04/03/16 1147 04/04/16 0517  WBC 4.2 5.4  HGB 11.2* 11.5*  HCT 34.8* 35.8*  MCV 89.7 90.2  PLT 190 196    Cardiac Enzymes:  Recent Labs Lab 04/03/16 1147 04/03/16 1440 04/03/16 2007 04/04/16 0259    TROPONINI <0.03 <0.03 <0.03 <0.03    BNP: Invalid input(s): POCBNP    Impression/Recommendations 1. Chest pain - no evidence of ACS by EKG or enzyme - we will plan for lexiscan and echo today to further evaluate potential cardiac etiology    Carlyle Dolly, M.D.

## 2016-04-04 NOTE — Progress Notes (Signed)
*  PRELIMINARY RESULTS* Echocardiogram 2D Echocardiogram has been performed.  Carol Duarte 04/04/2016, 5:41 PM

## 2016-04-04 NOTE — Progress Notes (Signed)
Pt for stress test

## 2016-04-04 NOTE — Care Management Obs Status (Signed)
[facility] NOTIFICATION   Patient Details  Name: Carol Duarte MRN: [id number] Date of Birth: 1925-03-02   Medicare Observation Status Notification Given:  Yes    Sherald Barge, RN 04/04/2016, 3:03 PM

## 2016-04-05 ENCOUNTER — Observation Stay (HOSPITAL_COMMUNITY): Payer: Medicare Other

## 2016-04-05 LAB — CBC
HEMATOCRIT: 33.9 % — AB (ref 36.0–46.0)
HEMOGLOBIN: 10.7 g/dL — AB (ref 12.0–15.0)
MCH: 28.2 pg (ref 26.0–34.0)
MCHC: 31.6 g/dL (ref 30.0–36.0)
MCV: 89.4 fL (ref 78.0–100.0)
Platelets: 189 10*3/uL (ref 150–400)
RBC: 3.79 MIL/uL — ABNORMAL LOW (ref 3.87–5.11)
RDW: 15.4 % (ref 11.5–15.5)
WBC: 4.7 10*3/uL (ref 4.0–10.5)

## 2016-04-05 LAB — HEPARIN LEVEL (UNFRACTIONATED)

## 2016-04-05 MED ORDER — LABETALOL HCL 100 MG PO TABS: 100.0000 mg | ORAL_TABLET | Freq: Two times a day (BID) | ORAL | 12 refills | Status: AC

## 2016-04-05 MED ORDER — IOPAMIDOL (ISOVUE-370) INJECTION 76%
80.0000 mL | Freq: Once | INTRAVENOUS | Status: AC | PRN
  Administered 2016-04-05: 80 mL via INTRAVENOUS

## 2016-04-05 MED ORDER — PANTOPRAZOLE SODIUM 40 MG PO TBEC: 40.0000 mg | DELAYED_RELEASE_TABLET | Freq: Every day | ORAL | 12 refills | Status: DC

## 2016-04-05 NOTE — Progress Notes (Signed)
Subjective: She feels okay. No further chest pain. Her stress test was negative for ischemia. Echocardiogram showed grade 2 diastolic dysfunction but no Wentz motion abnormalities and normal systolic function  Objective: Vital signs in last 24 hours: Temp:  [97 F (36.1 C)-98.3 F (36.8 C)] 97.3 F (36.3 C) (08/08 0400) Pulse Rate:  [51-62] 55 (08/08 0600) Resp:  [14-22] 17 (08/08 0600) BP: (109-144)/(45-64) 126/61 (08/08 0600) SpO2:  [94 %-100 %] 96 % (08/08 0600) Weight:  [69.3 kg (152 lb 12.5 oz)] 69.3 kg (152 lb 12.5 oz) (08/08 0500) Weight change: -0.1 kg (-3.5 oz) Last BM Date: 04/02/16  Intake/Output from previous day: 08/07 0701 - 08/08 0700 In: 671.5 [P.O.:500; I.V.:171.5] Out: -   PHYSICAL EXAM General appearance: alert, cooperative and no distress Resp: clear to auscultation bilaterally Cardio: regular rate and rhythm, S1, S2 normal, no murmur, click, rub or gallop GI: soft, non-tender; bowel sounds normal; no masses,  no organomegaly Extremities: extremities normal, atraumatic, no cyanosis or edema  Lab Results:  Results for orders placed or performed during the hospital encounter of 04/03/16 (from the past 48 hour(s))  Basic metabolic panel     Status: Abnormal   Collection Time: 04/03/16 11:47 AM  Result Value Ref Range   Sodium 141 135 - 145 mmol/L   Potassium 4.3 3.5 - 5.1 mmol/L   Chloride 113 (H) 101 - 111 mmol/L   CO2 21 (L) 22 - 32 mmol/L   Glucose, Bld 108 (H) 65 - 99 mg/dL   BUN 26 (H) 6 - 20 mg/dL   Creatinine, Ser 1.11 (H) 0.44 - 1.00 mg/dL   Calcium 8.6 (L) 8.9 - 10.3 mg/dL   GFR calc non Af Amer 43 (L) >60 mL/min   GFR calc Af Amer 50 (L) >60 mL/min    Comment: (NOTE) The eGFR has been calculated using the CKD EPI equation. This calculation has not been validated in all clinical situations. eGFR's persistently <60 mL/min signify possible Chronic Kidney Disease.    Anion gap 7 5 - 15  CBC     Status: Abnormal   Collection Time: 04/03/16  11:47 AM  Result Value Ref Range   WBC 4.2 4.0 - 10.5 K/uL   RBC 3.88 3.87 - 5.11 MIL/uL   Hemoglobin 11.2 (L) 12.0 - 15.0 g/dL   HCT 34.8 (L) 36.0 - 46.0 %   MCV 89.7 78.0 - 100.0 fL   MCH 28.9 26.0 - 34.0 pg   MCHC 32.2 30.0 - 36.0 g/dL   RDW 15.3 11.5 - 15.5 %   Platelets 190 150 - 400 K/uL  Troponin I     Status: None   Collection Time: 04/03/16 11:47 AM  Result Value Ref Range   Troponin I <0.03 <0.03 ng/mL  Protime-INR     Status: None   Collection Time: 04/03/16 11:47 AM  Result Value Ref Range   Prothrombin Time 12.3 11.4 - 15.2 seconds   INR 0.91   MRSA PCR Screening     Status: Abnormal   Collection Time: 04/03/16  2:35 PM  Result Value Ref Range   MRSA by PCR POSITIVE (A) NEGATIVE    Comment:        The GeneXpert MRSA Assay (FDA approved for NASAL specimens only), is one component of a comprehensive MRSA colonization surveillance program. It is not intended to diagnose MRSA infection nor to guide or monitor treatment for MRSA infections. RESULT CALLED TO, READ BACK BY AND VERIFIED WITH: MCDANIELS,M. AT 1128 ON 04/04/2016  BY BAUGHAM,M.   Troponin I     Status: None   Collection Time: 04/03/16  2:40 PM  Result Value Ref Range   Troponin I <0.03 <0.03 ng/mL  Troponin I     Status: None   Collection Time: 04/03/16  8:07 PM  Result Value Ref Range   Troponin I <0.03 <0.03 ng/mL  Heparin level (unfractionated)     Status: None   Collection Time: 04/03/16  8:07 PM  Result Value Ref Range   Heparin Unfractionated 0.34 0.30 - 0.70 IU/mL    Comment:        IF HEPARIN RESULTS ARE BELOW EXPECTED VALUES, AND PATIENT DOSAGE HAS BEEN CONFIRMED, SUGGEST FOLLOW UP TESTING OF ANTITHROMBIN III LEVELS.   Troponin I     Status: None   Collection Time: 04/04/16  2:59 AM  Result Value Ref Range   Troponin I <0.03 <0.03 ng/mL  Heparin level (unfractionated)     Status: Abnormal   Collection Time: 04/04/16  5:17 AM  Result Value Ref Range   Heparin Unfractionated 0.96  (H) 0.30 - 0.70 IU/mL    Comment:        IF HEPARIN RESULTS ARE BELOW EXPECTED VALUES, AND PATIENT DOSAGE HAS BEEN CONFIRMED, SUGGEST FOLLOW UP TESTING OF ANTITHROMBIN III LEVELS.   CBC     Status: Abnormal   Collection Time: 04/04/16  5:17 AM  Result Value Ref Range   WBC 5.4 4.0 - 10.5 K/uL   RBC 3.97 3.87 - 5.11 MIL/uL   Hemoglobin 11.5 (L) 12.0 - 15.0 g/dL   HCT 35.8 (L) 36.0 - 46.0 %   MCV 90.2 78.0 - 100.0 fL   MCH 29.0 26.0 - 34.0 pg   MCHC 32.1 30.0 - 36.0 g/dL   RDW 15.4 11.5 - 15.5 %   Platelets 196 150 - 400 K/uL  Heparin level (unfractionated)     Status: None   Collection Time: 04/04/16  3:47 PM  Result Value Ref Range   Heparin Unfractionated 0.57 0.30 - 0.70 IU/mL    Comment:        IF HEPARIN RESULTS ARE BELOW EXPECTED VALUES, AND PATIENT DOSAGE HAS BEEN CONFIRMED, SUGGEST FOLLOW UP TESTING OF ANTITHROMBIN III LEVELS.   Heparin level (unfractionated)     Status: Abnormal   Collection Time: 04/05/16  4:34 AM  Result Value Ref Range   Heparin Unfractionated <0.10 (L) 0.30 - 0.70 IU/mL    Comment:        IF HEPARIN RESULTS ARE BELOW EXPECTED VALUES, AND PATIENT DOSAGE HAS BEEN CONFIRMED, SUGGEST FOLLOW UP TESTING OF ANTITHROMBIN III LEVELS.   CBC     Status: Abnormal   Collection Time: 04/05/16  4:34 AM  Result Value Ref Range   WBC 4.7 4.0 - 10.5 K/uL   RBC 3.79 (L) 3.87 - 5.11 MIL/uL   Hemoglobin 10.7 (L) 12.0 - 15.0 g/dL   HCT 33.9 (L) 36.0 - 46.0 %   MCV 89.4 78.0 - 100.0 fL   MCH 28.2 26.0 - 34.0 pg   MCHC 31.6 30.0 - 36.0 g/dL   RDW 15.4 11.5 - 15.5 %   Platelets 189 150 - 400 K/uL    ABGS No results for input(s): PHART, PO2ART, TCO2, HCO3 in the last 72 hours.  Invalid input(s): PCO2 CULTURES Recent Results (from the past 240 hour(s))  MRSA PCR Screening     Status: Abnormal   Collection Time: 04/03/16  2:35 PM  Result Value Ref Range Status   MRSA  by PCR POSITIVE (A) NEGATIVE Final    Comment:        The GeneXpert MRSA Assay  (FDA approved for NASAL specimens only), is one component of a comprehensive MRSA colonization surveillance program. It is not intended to diagnose MRSA infection nor to guide or monitor treatment for MRSA infections. RESULT CALLED TO, READ BACK BY AND VERIFIED WITH: MCDANIELS,M. AT 1128 ON 04/04/2016 BY BAUGHAM,M.    Studies/Results: Dg Chest 2 View  Result Date: 04/03/2016 CLINICAL DATA:  82 year old female with history of midsternal chest pain this morning while at church. EXAM: CHEST  2 VIEW COMPARISON:  Chest x-ray 11/20/2013. 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 Eventration of the right hemidiaphragm. Lung volumes are normal. No consolidative airspace disease. No pleural effusions. No pneumothorax. No pulmonary nodule or mass noted. Pulmonary vasculature and the cardiomediastinal silhouette are within normal limits. Atherosclerosis in the thoracic aorta. IMPRESSION: 1.  No radiographic evidence of acute cardiopulmonary disease. 2. Aortic atherosclerosis. Electronically Signed   By: Vinnie Langton M.D.   On: 04/03/2016 12:29   Nm Myocar Multi W/spect W/[place] Motion / Ef  Result Date: 04/04/2016  There was no ST segment deviation noted during stress.  The study is normal.  This is a low risk study.  The left ventricular ejection fraction is hyperdynamic (>65%).     Medications:  Prior to Admission:  Prescriptions Prior to Admission  Medication Sig Dispense Refill Last Dose  . amLODipine (NORVASC) 5 MG tablet Take 5 mg by mouth daily.   04/02/2016 at Unknown time  . aspirin EC 81 MG tablet Take 1 tablet (81 mg total) by mouth daily. 90 tablet 3 04/03/2016 at Unknown time  . labetalol (NORMODYNE) 100 MG tablet Take 100 mg by mouth daily.   04/02/2016 at Unknown time  . oxybutynin (DITROPAN) 5 MG tablet Take 5 mg by mouth daily.    04/02/2016 at Unknown time  . simvastatin (ZOCOR) 40 MG tablet Take 40 mg by mouth daily.    04/02/2016 at Unknown time  . acetaminophen (TYLENOL) 500 MG tablet Take 1,000 mg by mouth every 6 (six) hours as needed for mild pain or fever.    unknown   Scheduled: . amLODipine  5 mg Oral Daily  . aspirin  325 mg Oral Daily  . Chlorhexidine Gluconate Cloth  6 each Topical Q0600  . heparin subcutaneous  5,000 Units Subcutaneous Q8H  . labetalol  100 mg Oral Daily  . mupirocin ointment  1 application Nasal BID  . oxybutynin  5 mg Oral Daily  . pantoprazole  40 mg Oral Q0600  . simvastatin  40 mg Oral Daily  . sodium chloride flush  3 mL Intravenous Q12H   Continuous:  JHE:RDEYCXKGY, morphine injection, ondansetron **OR** ondansetron (ZOFRAN) IV  Assesment: She was admitted with chest pain. It's not totally clear what the cause of her pain is. I'm going to have her get a CT angiogram and if it's negative send her home. Principal Problem:   Unstable angina (HCC) Active Problems:   HTN (hypertension)   HLD (hyperlipidemia)    Plan: As above    LOS: 0 days   Joanthan Hlavacek L 04/05/2016, 7:38 AM

## 2016-04-05 NOTE — Progress Notes (Signed)
Negative cardiac workup including including echo and nuclear stress test. No further cardiac workup planned at this time, we will sign off   Zandra Abts MD

## 2016-04-05 NOTE — Discharge Summary (Signed)
Physician Discharge Summary  Patient ID: Carol Duarte MRN: [id number] DOB/AGE: Sep 04, 1924 82 y.o. Primary Care Physician:FANTA,TESFAYE, MD Admit date: 04/03/2016 Discharge date: 04/05/2016    Discharge Diagnoses:   Principal Problem:   Unstable angina ([facility]) Active Problems:   HTN (hypertension)   HLD (hyperlipidemia) Chest pain    Medication List    TAKE these medications   acetaminophen 500 MG tablet Commonly known as:  TYLENOL Take 1,000 mg by mouth every 6 (six) hours as needed for mild pain or fever.   amLODipine 5 MG tablet Commonly known as:  NORVASC Take 5 mg by mouth daily.   aspirin EC 81 MG tablet Take 1 tablet (81 mg total) by mouth daily.   labetalol 100 MG tablet Commonly known as:  NORMODYNE Take 1 tablet (100 mg total) by mouth 2 (two) times daily. What changed:  when to take this   oxybutynin 5 MG tablet Commonly known as:  DITROPAN Take 5 mg by mouth daily.   pantoprazole 40 MG tablet Commonly known as:  PROTONIX Take 1 tablet (40 mg total) by mouth daily at 6 (six) AM.   simvastatin 40 MG tablet Commonly known as:  ZOCOR Take 40 mg by mouth daily.       Discharged Condition:Improved    Consults: Cardiology  Significant Diagnostic Studies: Dg Chest 2 View  Result Date: 04/03/2016 CLINICAL DATA:  82 year old female with history of midsternal chest pain this morning while at church. EXAM: CHEST  2 VIEW COMPARISON:  Chest x-ray 11/20/2013. 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 Eventration of the right hemidiaphragm. Lung volumes are normal. No consolidative airspace disease. No pleural effusions. No pneumothorax. No pulmonary nodule or mass noted. Pulmonary vasculature and the cardiomediastinal silhouette are within normal limits. Atherosclerosis in the thoracic aorta. IMPRESSION: 1.  No radiographic evidence of acute cardiopulmonary disease. 2. Aortic atherosclerosis.  Electronically Signed   By: Vinnie Langton M.D.   On: 04/03/2016 12:29   Nm Myocar Multi W/spect W/Radoncic Motion / Ef  Result Date: 04/04/2016  There was no ST segment deviation noted during stress.  The study is normal.  This is a low risk study.  The left ventricular ejection fraction is hyperdynamic (>65%).     Lab Results: Basic Metabolic Panel:  Recent Labs  04/03/16 1147  NA 141  K 4.3  CL 113*  CO2 21*  GLUCOSE 108*  BUN 26*  CREATININE 1.11*  CALCIUM 8.6*   Liver Function Tests: No results for input(s): AST, ALT, ALKPHOS, BILITOT, PROT, ALBUMIN in the last 72 hours.   CBC:  Recent Labs  04/04/16 0517 04/05/16 0434  WBC 5.4 4.7  HGB 11.5* 10.7*  HCT 35.8* 33.9*  MCV 90.2 89.4  PLT 196 189    Recent Results (from the past 240 hour(s))  MRSA PCR Screening     Status: Abnormal   Collection Time: 04/03/16  2:35 PM  Result Value Ref Range Status   MRSA by PCR POSITIVE (A) NEGATIVE Final    Comment:        The GeneXpert MRSA Assay (FDA approved for NASAL specimens only), is one component of a comprehensive MRSA colonization surveillance program. It is not intended to diagnose MRSA infection nor to guide or monitor treatment for MRSA infections. RESULT CALLED TO, READ BACK BY AND VERIFIED WITH: MCDANIELS,M. AT 1128 ON 04/04/2016 BY [facility] Course: This is an 82 year old came to the hospital because of chest  pain. She had a previous episode about 3 weeks ago had been referred to cardiology and was scheduled for outpatient stress testing. She was brought into the hospital for observation to rule out MI. She was started on heparin drip initially. She did rule out for MI and cardiology consultation underwent stress testing which was low risk. She had echocardiogram that showed grade 2 diastolic dysfunction but she really didn't have any symptoms of heart failure. She underwent CT angiogram of the chest to make sure she didn't have blood clots  as a cause of her chest pain.  Discharge Exam: Blood pressure 126/61, pulse (!) 55, temperature 97.3 F (36.3 C), temperature source Oral, resp. rate 17, height 5\' 2"  (1.575 m), weight 69.3 kg (152 lb 12.5 oz), SpO2 96 %. She is awake and alert. She looks comfortable. Chest is clear. Heart is regular  Disposition: Home I adjusted her blood pressure medication because her blood pressure was high when she got here and put her on pantoprazole since her pain may have been related to GERD      Signed: Rockwell Zentz L   04/05/2016, 7:42 AM

## 2016-04-05 NOTE — Progress Notes (Signed)
Discharge instructions reviewed with patient at bedside. Denies questions or concerns. PIV access discontinued, catheter intact on removal. Pt awaiting son for pick up.

## 2016-04-06 ENCOUNTER — Inpatient Hospital Stay (HOSPITAL_COMMUNITY): Admission: RE | Admit: 2016-04-06 | Payer: Medicare Other | Source: Ambulatory Visit

## 2016-04-06 ENCOUNTER — Encounter (HOSPITAL_COMMUNITY): Payer: Medicare Other

## 2016-05-18 ENCOUNTER — Ambulatory Visit (INDEPENDENT_AMBULATORY_CARE_PROVIDER_SITE_OTHER): Payer: Medicare Other | Admitting: Cardiovascular Disease

## 2016-05-18 ENCOUNTER — Encounter: Payer: Self-pay | Admitting: Cardiovascular Disease

## 2016-05-18 VITALS — BP 144/60 | HR 56 | Ht 62.5 in | Wt 153.0 lb

## 2016-05-18 DIAGNOSIS — R072 Precordial pain: Secondary | ICD-10-CM

## 2016-05-18 DIAGNOSIS — I10 Essential (primary) hypertension: Secondary | ICD-10-CM

## 2016-05-18 DIAGNOSIS — E785 Hyperlipidemia, unspecified: Secondary | ICD-10-CM

## 2016-05-18 DIAGNOSIS — Z87898 Personal history of other specified conditions: Secondary | ICD-10-CM

## 2016-05-18 DIAGNOSIS — Z9289 Personal history of other medical treatment: Secondary | ICD-10-CM

## 2016-05-18 DIAGNOSIS — R5383 Other fatigue: Secondary | ICD-10-CM | POA: Diagnosis not present

## 2016-05-18 NOTE — Progress Notes (Signed)
      SUBJECTIVE: Patient follows up for chest pain. Hospitalized in early August for this. This was after my office visit with her. Normal nuclear stress test 04/04/16. Echocardiogram demonstrated vigorous left ventricular systolic function, EF 02-58%, with grade 2 diastolic dysfunction.  Feels fatigued but no chest pain. Wants to dance and cook for her family again. Likes to be active.   Review of Systems: As per "subjective", otherwise negative.  Allergies  Allergen Reactions  . Naproxen Nausea Only and Other (See Comments)    Chest pain    Current Outpatient Prescriptions  Medication Sig Dispense Refill  . acetaminophen (TYLENOL) 500 MG tablet Take 1,000 mg by mouth every 6 (six) hours as needed for mild pain or fever.     Marland Kitchen amLODipine (NORVASC) 5 MG tablet Take 5 mg by mouth daily.    Marland Kitchen aspirin EC 81 MG tablet Take 1 tablet (81 mg total) by mouth daily. 90 tablet 3  . labetalol (NORMODYNE) 100 MG tablet Take 1 tablet (100 mg total) by mouth 2 (two) times daily. 60 tablet 12  . oxybutynin (DITROPAN) 5 MG tablet Take 5 mg by mouth daily.     . pantoprazole (PROTONIX) 40 MG tablet Take 1 tablet (40 mg total) by mouth daily at 6 (six) AM. 30 tablet 12  . simvastatin (ZOCOR) 40 MG tablet Take 40 mg by mouth daily.     No current facility-administered medications for this visit.     Past Medical History:  Diagnosis Date  . Hypertension     Past Surgical History:  Procedure Laterality Date  . APPENDECTOMY    . brain cyst removed    . CATARACT EXTRACTION W/PHACO Left 11/09/2015   Procedure: CATARACT EXTRACTION PHACO AND INTRAOCULAR LENS PLACEMENT LEFT EYE cde=8.97;  Surgeon: [name], MD;  Location: [facility];  Service: Ophthalmology;  Laterality: Left;  . TONSILLECTOMY AND ADENOIDECTOMY      Social History   Social History  . Marital status: Widowed    Spouse name: N/A  . Number of children: N/A  . Years of education: N/A   Occupational History  . Not on file.    Social History Main Topics  . Smoking status: Never Smoker  . Smokeless tobacco: Former Systems developer  . Alcohol use No  . Drug use: No  . Sexual activity: Not Currently    Partners: Male   Other Topics Concern  . Not on file   Social History Narrative  . No narrative on file     Vitals:   05/18/16 1046  BP: (!) 144/60  Pulse: (!) 56  SpO2: 97%  Weight: 153 lb (69.4 kg)  Height: 5' 2.5" (1.588 m)    PHYSICAL EXAM General: NAD HEENT: Normal. Neck: No JVD, no thyromegaly. Lungs: Clear to auscultation bilaterally with normal respiratory effort. CV: Nondisplaced PMI.  Regular rate and rhythm, normal S1/S2, no S3/S4, no murmur. No pretibial or periankle edema.     Abdomen: Soft, nontender, no distention.  Neurologic: Alert and oriented.  Psych: Normal affect. Skin: Normal. Musculoskeletal: No gross deformities.    ECG: Most recent ECG reviewed.      ASSESSMENT AND PLAN: 1. Chest pain and recent fatigue: Normal nuclear myocardial perfusion imaging study (Lexiscan). Normal LV systolic function. No further testing indicated.  2. Essential HTN: Reasonably controlled for age. No changes.  3. Hyperlipidemia: Continue statin therapy.  Dispo: fu prn   Kate Sable, M.D., F.A.C.C.

## 2016-05-18 NOTE — Patient Instructions (Signed)
Medication Instructions:  Your physician recommends that you continue on your current medications as directed. Please refer to the Current Medication list given to you today.   Labwork: NONE  Testing/Procedures: NONE  Follow-Up: Your physician recommends that you schedule a follow-up appointment in: AS NEEDED      Any Other Special Instructions Will Be Listed Below (If Applicable).     If you need a refill on your cardiac medications before your next appointment, please call your pharmacy.   

## 2016-09-13 DIAGNOSIS — E1165 Type 2 diabetes mellitus with hyperglycemia: Secondary | ICD-10-CM | POA: Diagnosis not present

## 2016-09-13 DIAGNOSIS — E785 Hyperlipidemia, unspecified: Secondary | ICD-10-CM | POA: Diagnosis not present

## 2016-09-13 DIAGNOSIS — D509 Iron deficiency anemia, unspecified: Secondary | ICD-10-CM | POA: Diagnosis not present

## 2016-09-13 DIAGNOSIS — I10 Essential (primary) hypertension: Secondary | ICD-10-CM | POA: Diagnosis not present

## 2016-09-13 DIAGNOSIS — G930 Cerebral cysts: Secondary | ICD-10-CM | POA: Diagnosis not present

## 2016-09-27 DIAGNOSIS — E785 Hyperlipidemia, unspecified: Secondary | ICD-10-CM | POA: Diagnosis not present

## 2016-09-27 DIAGNOSIS — I10 Essential (primary) hypertension: Secondary | ICD-10-CM | POA: Diagnosis not present

## 2016-09-29 DIAGNOSIS — I10 Essential (primary) hypertension: Secondary | ICD-10-CM | POA: Diagnosis not present

## 2016-09-29 DIAGNOSIS — E1165 Type 2 diabetes mellitus with hyperglycemia: Secondary | ICD-10-CM | POA: Diagnosis not present

## 2016-09-29 DIAGNOSIS — G930 Cerebral cysts: Secondary | ICD-10-CM | POA: Diagnosis not present

## 2016-09-29 DIAGNOSIS — D649 Anemia, unspecified: Secondary | ICD-10-CM | POA: Diagnosis not present

## 2016-10-10 DIAGNOSIS — Z0189 Encounter for other specified special examinations: Secondary | ICD-10-CM | POA: Diagnosis not present

## 2016-12-12 DIAGNOSIS — E785 Hyperlipidemia, unspecified: Secondary | ICD-10-CM | POA: Diagnosis not present

## 2016-12-12 DIAGNOSIS — D500 Iron deficiency anemia secondary to blood loss (chronic): Secondary | ICD-10-CM | POA: Diagnosis not present

## 2016-12-12 DIAGNOSIS — I10 Essential (primary) hypertension: Secondary | ICD-10-CM | POA: Diagnosis not present

## 2017-03-08 ENCOUNTER — Other Ambulatory Visit (HOSPITAL_COMMUNITY): Payer: Self-pay | Admitting: Internal Medicine

## 2017-03-08 DIAGNOSIS — IMO0002 Reserved for concepts with insufficient information to code with codable children: Secondary | ICD-10-CM

## 2017-03-08 DIAGNOSIS — R229 Localized swelling, mass and lump, unspecified: Principal | ICD-10-CM

## 2017-03-14 ENCOUNTER — Other Ambulatory Visit (HOSPITAL_COMMUNITY): Payer: Self-pay | Admitting: Internal Medicine

## 2017-03-14 ENCOUNTER — Ambulatory Visit (HOSPITAL_COMMUNITY)
Admission: RE | Admit: 2017-03-14 | Discharge: 2017-03-14 | Disposition: A | Discharge status: Home / Self Care | Payer: Medicare Other | Source: Ambulatory Visit | Attending: Internal Medicine | Admitting: Internal Medicine

## 2017-03-14 ENCOUNTER — Ambulatory Visit (HOSPITAL_COMMUNITY): Payer: Medicare Other

## 2017-03-14 DIAGNOSIS — C50912 Malignant neoplasm of unspecified site of left female breast: Secondary | ICD-10-CM | POA: Diagnosis not present

## 2017-03-14 DIAGNOSIS — R229 Localized swelling, mass and lump, unspecified: Principal | ICD-10-CM

## 2017-03-14 DIAGNOSIS — C50812 Malignant neoplasm of overlapping sites of left female breast: Secondary | ICD-10-CM | POA: Diagnosis not present

## 2017-03-14 DIAGNOSIS — IMO0002 Reserved for concepts with insufficient information to code with codable children: Secondary | ICD-10-CM

## 2017-03-14 DIAGNOSIS — N6323 Unspecified lump in the left breast, lower outer quadrant: Secondary | ICD-10-CM | POA: Diagnosis not present

## 2017-03-14 DIAGNOSIS — N6322 Unspecified lump in the left breast, upper inner quadrant: Secondary | ICD-10-CM | POA: Diagnosis not present

## 2017-03-14 DIAGNOSIS — N6321 Unspecified lump in the left breast, upper outer quadrant: Secondary | ICD-10-CM | POA: Diagnosis not present

## 2017-03-14 DIAGNOSIS — R928 Other abnormal and inconclusive findings on diagnostic imaging of breast: Secondary | ICD-10-CM | POA: Diagnosis not present

## 2017-03-14 MED ORDER — LIDOCAINE HCL (PF) 1 % IJ SOLN
INTRAMUSCULAR | Status: AC
  Administered 2017-03-14: 5 mL
  Filled 2017-03-14: qty 5

## 2017-03-14 MED ORDER — LIDOCAINE-EPINEPHRINE (PF) 1 %-1:200000 IJ SOLN
INTRAMUSCULAR | Status: AC
  Administered 2017-03-14: 5 mL
  Filled 2017-03-14: qty 30

## 2017-03-20 DIAGNOSIS — C50912 Malignant neoplasm of unspecified site of left female breast: Secondary | ICD-10-CM | POA: Diagnosis not present

## 2017-03-20 DIAGNOSIS — E785 Hyperlipidemia, unspecified: Secondary | ICD-10-CM | POA: Diagnosis not present

## 2017-03-20 DIAGNOSIS — D500 Iron deficiency anemia secondary to blood loss (chronic): Secondary | ICD-10-CM | POA: Diagnosis not present

## 2017-03-20 DIAGNOSIS — I10 Essential (primary) hypertension: Secondary | ICD-10-CM | POA: Diagnosis not present

## 2017-03-23 ENCOUNTER — Ambulatory Visit (INDEPENDENT_AMBULATORY_CARE_PROVIDER_SITE_OTHER): Payer: Medicare Other | Admitting: General Surgery

## 2017-03-23 ENCOUNTER — Encounter: Payer: Self-pay | Admitting: General Surgery

## 2017-03-23 VITALS — BP 166/58 | HR 52 | Temp 97.1°F | Resp 18 | Ht 63.0 in | Wt 152.0 lb

## 2017-03-23 DIAGNOSIS — C50512 Malignant neoplasm of lower-outer quadrant of left female breast: Secondary | ICD-10-CM

## 2017-03-23 NOTE — Patient Instructions (Signed)
Breast Cancer, Female Breast cancer is an abnormal growth of tissue (tumor) in the breast that is cancerous (malignant). Unlike noncancerous (benign) tumors, malignant tumors can spread to other parts of your body. The most common type of female breast cancer begins in the milk ducts (ductal carcinoma). Breast cancer is one of the most common types of cancer in women. What are the causes? The exact cause of female breast cancer is unknown. What increases the risk?  Age older than 59 years.  Family history of breast cancer.  Having the BRCA1 and BRCA2 genes.  Personal history of radiation exposure.  Obesity.  Menstrual periods that begin before age 57 years.  Menopause that begins after age 21 years.  Pregnant for the first time at the age of 105 years or older.  Using hormone therapy.  Drinking more than one alcoholic drink per day. What are the signs or symptoms?  A painless lump in your breast.  Changes in the size or shape of your breast.  Breast skin changes, such as puckering or dimpling.  Nipple abnormalities, such as scaling, crustiness, redness, or pulling in (retraction).  Nipple discharge that is bloody or clear. How is this diagnosed? Your health care provider will ask about your medical history. He or she may also perform a number of procedures, such as:  A physical exam. This will involve feeling the tissue around the breast and under the arms.  Taking a sample of nipple discharge. The sample will be examined under a microscope.  Breast X-rays (mammogram), breast ultrasound exams, or an MRI.  Taking a tissue sample (biopsy) from the breast. The sample will be examined under a microscope to look for cancer cells.  Your cancer will be staged to determine its severity and extent. Staging is a careful attempt to find out the size of the tumor, whether the cancer has spread, and if so, to what parts of the body. You may need to have more tests to determine the  stage of your cancer:  Stage 0-The tumor has not spread to other breast tissue.  Stage I-The cancer is only found in the breast. The tumor may be up to  in (2 cm) wide.  Stage II-The cancer has spread to nearby lymph nodes. The tumor may be up to 2 in (5 cm) wide.  Stage III-The cancer has spread to more distant lymph nodes. The tumor may be larger than 2 in (5 cm) wide.  Stage IV-The cancer has spread to other parts of the body, such as the bones, brain, liver, or lungs.  How is this treated? Depending on the type and stage, female breast cancer may be treated with one or more of the following therapies:  Surgery to remove just the tumor (lumpectomy) or the entire breast (mastectomy). Lymph nodes may also be removed.  Radiation therapy, which uses high-energy rays to kill cancer cells.  Chemotherapy, which is the use of drugs to kill cancer cells.  Hormone therapy, which involves taking medicine to adjust the hormone levels in your body. You may take medicine to decrease your estrogen levels. This can help stop cancer cells from growing.  Follow these instructions at home:  Take medicines only as directed by your health care provider.  Maintain a healthy diet.  Consider joining a support group. This may help you learn to cope with the stress of having breast cancer.  Keep all follow-up appointments as directed by your health care provider. Contact a health care provider if:  You have a sudden increase in pain.  You notice a new lump in either breast or under your arm.  You develop swelling in either arm or hand.  You lose weight without trying.  You have a fever.  You notice new fatigue or weakness. Get help right away if:  You have chest pain or trouble breathing.  You faint. This information is not intended to replace advice given to you by your health care provider. Make sure you discuss any questions you have with your health care provider. Document Released:  11/23/2005 Document Revised: 12/24/2015 Document Reviewed: 10/09/2013 Elsevier Interactive Patient Education  2017 [employer].

## 2017-03-23 NOTE — Progress Notes (Signed)
Carol Duarte; [id number]; [birth year]   HPI   Patient is a 83 year old white female who was referred to my care by Drs. Fanta/Jorosz for evaluation and treatment of a left breast cancer.  The patient states she found a lump several months ago but did not tell anybody.  She started having pain in the left breast.  She underwent a mammogram which showed the mass.  Core biopsy of the mass reveals invasive ductal carcinoma.  Patient denies a family history of breast cancer.  She denies any nipple discharge.  She currently has no pain. Past Medical History:  Diagnosis Date  . Hypertension     Past Surgical History:  Procedure Laterality Date  . APPENDECTOMY    . brain cyst removed    . CATARACT EXTRACTION W/PHACO Left 11/09/2015   Procedure: CATARACT EXTRACTION PHACO AND INTRAOCULAR LENS PLACEMENT LEFT EYE cde=8.97;  Surgeon: [name], MD;  Location: [facility];  Service: Ophthalmology;  Laterality: Left;  . TONSILLECTOMY AND ADENOIDECTOMY      Family History  Problem Relation Age of Onset  . Heart failure Mother   . Heart failure Father     Current Outpatient Prescriptions on File Prior to Visit  Medication Sig Dispense Refill  . acetaminophen (TYLENOL) 500 MG tablet Take 1,000 mg by mouth every 6 (six) hours as needed for mild pain or fever.     Marland Kitchen amLODipine (NORVASC) 5 MG tablet Take 5 mg by mouth daily.    Marland Kitchen aspirin EC 81 MG tablet Take 1 tablet (81 mg total) by mouth daily. 90 tablet 3  . labetalol (NORMODYNE) 100 MG tablet Take 1 tablet (100 mg total) by mouth 2 (two) times daily. 60 tablet 12  . oxybutynin (DITROPAN) 5 MG tablet Take 5 mg by mouth daily.     . pantoprazole (PROTONIX) 40 MG tablet Take 1 tablet (40 mg total) by mouth daily at 6 (six) AM. 30 tablet 12  . simvastatin (ZOCOR) 40 MG tablet Take 40 mg by mouth daily.     No current facility-administered medications on file prior to visit.     Allergies  Allergen Reactions  . Naproxen Nausea Only and Other (See Comments)    Chest pain    History  Alcohol Use No    History  Smoking Status  . Never Smoker  Smokeless Tobacco  . Former Systems developer    Review of Systems  Constitutional: Negative.   HENT: Negative.   Eyes: Negative.   Respiratory: Negative.   Cardiovascular: Negative.   Gastrointestinal: Negative.   Genitourinary: Negative.   Musculoskeletal: Negative.   Skin: Negative.   Neurological: Negative.   Endo/Heme/Allergies: Negative.   Psychiatric/Behavioral: Negative.     Objective   Vitals:   03/23/17 1153  BP: (!) 166/58  Pulse: (!) 52  Resp: 18  Temp: (!) 97.1 F (36.2 C)    Physical Exam  Constitutional: She is oriented to person, place, and time and well-developed, well-nourished, and in no distress.  HENT:  Head: Normocephalic and atraumatic.  Neck: Normal range of motion. Neck supple.  Cardiovascular: Normal rate, regular rhythm and normal heart sounds.   No murmur heard. Pulmonary/Chest: Effort normal and breath sounds normal. She has no wheezes.  Lymphadenopathy:    She has no cervical adenopathy.  Neurological: She is alert and oriented to person, place, and time.  Skin: Skin is warm and dry.  Vitals reviewed.   Breast: Left breast with dominant greater than 2 cm mass in the outer, lower  quadrant with associated skin dimpling.  No nipple discharge is noted.  Axilla is negative for palpable nodes.  Right breast examination reveals no dominant mass, nipple discharge, dimpling.  The axilla is negative for palpable nodes.    Mammography, ultrasound, and biopsy reports reviewed  Assessment   invasive ductal carcinoma of left breast Plan    I explained the surgical options for the patient including a left partial mastectomy with sentinel lymph node biopsy and postoperative radiation therapy as well as left modified radical mastectomy.  Patient is hesitant about having any surgery.  I did refer her to oncology for possible neoadjuvant therapy evaluation.  She was given to  the patient.  She will follow-up with me as needed.  This will be pending the oncology consultation.

## 2017-03-30 ENCOUNTER — Encounter (HOSPITAL_COMMUNITY): Payer: Self-pay

## 2017-03-30 ENCOUNTER — Encounter (HOSPITAL_COMMUNITY): Payer: Medicare Other | Attending: Oncology | Admitting: Oncology

## 2017-03-30 DIAGNOSIS — C50912 Malignant neoplasm of unspecified site of left female breast: Secondary | ICD-10-CM

## 2017-03-30 DIAGNOSIS — Z170 Estrogen receptor positive status [ER+]: Secondary | ICD-10-CM | POA: Diagnosis not present

## 2017-03-30 NOTE — Progress Notes (Signed)
[facility] Initial Visit:  Patient Care Team: [name], MD as PCP - General (Internal Medicine)  CHIEF COMPLAINTS/PURPOSE OF CONSULTATION:  Left breast cancer  HISTORY OF PRESENTING ILLNESS: Carol Duarte 83 y.o. female postmenopausal presents today for evaluation of left breast invasive ductal carcinoma. Patient has not had a mammogram since she was in her 47s. Over 2 months ago she started having pain in her left breast and palpated a lump. On 03/14/17 she underwent diagnostic bilateral mammogram with left breast ultrasound which demonstrated a 2 x 2.1 x 2.8 cm irregular hypoechoic mass at the 3:00 position 4 cm from the nipple. No definite lymphadenopathy seen in the left axilla. Ultrasound-guided biopsy was performed on 03/14/17 which demonstrated Nottingham grade 3 invasive ductal carcinoma, ER 20% positive (weakly staining), PR 0%, HER-2 negative. Patient denies any previous history of breast biopsies. She denies any family history of breast cancer. She currently lives with her daughter. She denies any chest pain, shortness breath, abdominal pain, change in appetite, focal weakness, fatigue. She has seen Dr. Arnoldo Morale in surgical consultation on 03/23/17 and at that time patient was hesitant about any surgery. Therefore she was referred here for neoadjuvant treatment consideration.     Review of Systems  Constitutional: Negative for appetite change, chills, fatigue and fever.  HENT:   Negative for hearing loss, lump/mass, mouth sores, sore throat and tinnitus.   Eyes: Negative for eye problems and icterus.  Respiratory: Negative for chest tightness, cough, hemoptysis, shortness of breath and wheezing.   Cardiovascular: Negative for chest pain, leg swelling and palpitations.  Gastrointestinal: Negative for abdominal distention, abdominal pain, blood in stool, diarrhea, nausea and vomiting.  Endocrine: Negative.  Negative for hot flashes.  Genitourinary: Negative for  difficulty urinating, frequency and hematuria.   Musculoskeletal: Negative for arthralgias and neck pain.  Skin: Negative for itching and rash.  Neurological: Negative for dizziness, headaches and speech difficulty.  Hematological: Negative for adenopathy. Does not bruise/bleed easily.  Psychiatric/Behavioral: Negative for confusion. The patient is not nervous/anxious.     MEDICAL HISTORY: Past Medical History:  Diagnosis Date  . Hypertension     SURGICAL HISTORY: Past Surgical History:  Procedure Laterality Date  . APPENDECTOMY    . brain cyst removed    . CATARACT EXTRACTION W/PHACO Left 11/09/2015   Procedure: CATARACT EXTRACTION PHACO AND INTRAOCULAR LENS PLACEMENT LEFT EYE cde=8.97;  Surgeon: [name], MD;  Location: [facility];  Service: Ophthalmology;  Laterality: Left;  . TONSILLECTOMY AND ADENOIDECTOMY      SOCIAL HISTORY: Social History   Social History  . Marital status: Widowed    Spouse name: N/A  . Number of children: N/A  . Years of education: N/A   Occupational History  . Not on file.   Social History Main Topics  . Smoking status: Never Smoker  . Smokeless tobacco: Former Systems developer  . Alcohol use No  . Drug use: No  . Sexual activity: Not Currently    Partners: Male   Other Topics Concern  . Not on file   Social History Narrative  . No narrative on file    FAMILY HISTORY Family History  Problem Relation Age of Onset  . Heart failure Mother   . Heart failure Father     ALLERGIES:  is allergic to naproxen.  MEDICATIONS:  Current Outpatient Prescriptions  Medication Sig Dispense Refill  . acetaminophen (TYLENOL) 500 MG tablet Take 1,000 mg by mouth every 6 (six) hours as needed for mild pain  or fever.     Marland Kitchen amLODipine (NORVASC) 5 MG tablet Take 5 mg by mouth daily.    Marland Kitchen aspirin EC 81 MG tablet Take 1 tablet (81 mg total) by mouth daily. 90 tablet 3  . labetalol (NORMODYNE) 100 MG tablet Take 1 tablet (100 mg total) by mouth 2 (two) times daily.  60 tablet 12  . oxybutynin (DITROPAN) 5 MG tablet Take 5 mg by mouth daily.     . pantoprazole (PROTONIX) 40 MG tablet Take 1 tablet (40 mg total) by mouth daily at 6 (six) AM. 30 tablet 12  . simvastatin (ZOCOR) 40 MG tablet Take 40 mg by mouth daily.     No current facility-administered medications for this visit.     PHYSICAL EXAMINATION:  ECOG PERFORMANCE STATUS: 1 - Symptomatic but completely ambulatory   Vitals:   03/30/17 0838  BP: (!) 146/88  Pulse: (!) 55  Resp: 18  Temp: 98.4 F (36.9 C)    Filed Weights   03/30/17 0838  Weight: 150 lb (68 kg)     Physical Exam  Constitutional: She is oriented to person, place, and time and well-developed, well-nourished, and in no distress. No distress.  HENT:  Head: Normocephalic and atraumatic.  Mouth/Throat: No oropharyngeal exudate.  Eyes: Pupils are equal, round, and reactive to light. Conjunctivae are normal. No scleral icterus.  Neck: Normal range of motion. Neck supple. No JVD present.  Cardiovascular: Normal rate, regular rhythm and normal heart sounds.  Exam reveals no gallop and no friction rub.   No murmur heard. Pulmonary/Chest: Breath sounds normal. No respiratory distress. She has no wheezes. She has no rales. Right breast exhibits no inverted nipple, no mass, no nipple discharge, no skin change and no tenderness.    Abdominal: Soft. Bowel sounds are normal. She exhibits no distension. There is no tenderness. There is no guarding.  Musculoskeletal: She exhibits no edema (bilateral ankle edema 1+) or tenderness.  Lymphadenopathy:    She has no cervical adenopathy.  Neurological: She is alert and oriented to person, place, and time. No cranial nerve deficit.  Skin: Skin is warm and dry. No rash noted. No erythema. No pallor.  Psychiatric: Affect and judgment normal.     LABORATORY DATA: I have personally reviewed the data as listed:  No visits with results within 1 Month(s) from this visit.  Latest known  visit with results is:  Admission on 04/03/2016, Discharged on 04/05/2016  Component Date Value Ref Range Status  . Sodium 04/03/2016 141  135 - 145 mmol/L Final  . Potassium 04/03/2016 4.3  3.5 - 5.1 mmol/L Final  . Chloride 04/03/2016 113* 101 - 111 mmol/L Final  . CO2 04/03/2016 21* 22 - 32 mmol/L Final  . Glucose, Bld 04/03/2016 108* 65 - 99 mg/dL Final  . BUN 04/03/2016 26* 6 - 20 mg/dL Final  . Creatinine, Ser 04/03/2016 1.11* 0.44 - 1.00 mg/dL Final  . Calcium 04/03/2016 8.6* 8.9 - 10.3 mg/dL Final  . GFR calc non Af Amer 04/03/2016 43* >60 mL/min Final  . GFR calc Af Amer 04/03/2016 50* >60 mL/min Final   Comment: (NOTE) The eGFR has been calculated using the CKD EPI equation. This calculation has not been validated in all clinical situations. eGFR's persistently <60 mL/min signify possible Chronic Kidney Disease.   . Anion gap 04/03/2016 7  5 - 15 Final  . WBC 04/03/2016 4.2  4.0 - 10.5 K/uL Final  . RBC 04/03/2016 3.88  3.87 - 5.11 MIL/uL Final  .  Hemoglobin 04/03/2016 11.2* 12.0 - 15.0 g/dL Final  . HCT 04/03/2016 34.8* 36.0 - 46.0 % Final  . MCV 04/03/2016 89.7  78.0 - 100.0 fL Final  . MCH 04/03/2016 28.9  26.0 - 34.0 pg Final  . MCHC 04/03/2016 32.2  30.0 - 36.0 g/dL Final  . RDW 04/03/2016 15.3  11.5 - 15.5 % Final  . Platelets 04/03/2016 190  150 - 400 K/uL Final  . Troponin I 04/03/2016 <0.03  <0.03 ng/mL Final  . MRSA by PCR 04/03/2016 POSITIVE* NEGATIVE Final   Comment:        The GeneXpert MRSA Assay (FDA approved for NASAL specimens only), is one component of a comprehensive MRSA colonization surveillance program. It is not intended to diagnose MRSA infection nor to guide or monitor treatment for MRSA infections. RESULT CALLED TO, READ BACK BY AND VERIFIED WITH: MCDANIELS,M. AT 1128 ON 04/04/2016 BY BAUGHAM,M.   . Troponin I 04/03/2016 <0.03  <0.03 ng/mL Final  . Troponin I 04/03/2016 <0.03  <0.03 ng/mL Final  . Troponin I 04/04/2016 <0.03  <0.03  ng/mL Final  . Prothrombin Time 04/03/2016 12.3  11.4 - 15.2 seconds Final  . INR 04/03/2016 0.91   Final  . Heparin Unfractionated 04/03/2016 0.34  0.30 - 0.70 IU/mL Final   Comment:        IF HEPARIN RESULTS ARE BELOW EXPECTED VALUES, AND PATIENT DOSAGE HAS BEEN CONFIRMED, SUGGEST FOLLOW UP TESTING OF ANTITHROMBIN III LEVELS.   Marland Kitchen Heparin Unfractionated 04/04/2016 0.96* 0.30 - 0.70 IU/mL Final   Comment:        IF HEPARIN RESULTS ARE BELOW EXPECTED VALUES, AND PATIENT DOSAGE HAS BEEN CONFIRMED, SUGGEST FOLLOW UP TESTING OF ANTITHROMBIN III LEVELS.   . WBC 04/04/2016 5.4  4.0 - 10.5 K/uL Final  . RBC 04/04/2016 3.97  3.87 - 5.11 MIL/uL Final  . Hemoglobin 04/04/2016 11.5* 12.0 - 15.0 g/dL Final  . HCT 04/04/2016 35.8* 36.0 - 46.0 % Final  . MCV 04/04/2016 90.2  78.0 - 100.0 fL Final  . MCH 04/04/2016 29.0  26.0 - 34.0 pg Final  . MCHC 04/04/2016 32.1  30.0 - 36.0 g/dL Final  . RDW 04/04/2016 15.4  11.5 - 15.5 % Final  . Platelets 04/04/2016 196  150 - 400 K/uL Final  . Weight 04/04/2016 2455.04  oz Final  . Height 04/04/2016 62  in Final  . BP 04/04/2016 125/53  mmHg Final  . AV vel 04/04/2016 1.73   Final  . LV PW d 04/04/2016 10.3* 0.6 - 1.1 mm Final  . FS 04/04/2016 43  28 - 44 % Final  . LA vol 04/04/2016 47.9  mL Final  . LA ID, A-P, ES 04/04/2016 33  mm Final  . IVS/LV PW RATIO, ED 04/04/2016 .99   Final  . Stroke v 04/04/2016 25  ml Final  . LVOT VTI 04/04/2016 39.1  cm Final  . LV e' LATERAL 04/04/2016 5.55  cm/s Final  . LV E/e' medial 04/04/2016 22.52   Final  . LV E/e'average 04/04/2016 22.52   Final  . AV pk vel 04/04/2016 178  cm/s Final  . AV Area VTI index 04/04/2016 .98  cm2/m2 Final  . AV Area VTI 04/04/2016 1.84  cm2 Final  . AV VEL mean LVOT/AV 04/04/2016 .9   Final  . AV Area mean vel 04/04/2016 1.81  cm2 Final  . AV area mean vel ind 04/04/2016 1.02  cm2/m2 Final  . LA diam index 04/04/2016 1.87  cm/m2 Final  .  LA vol A4C 04/04/2016 45.4  ml Final   . Mean grad 04/04/2016 6  mmHg Final  . Valve area 04/04/2016 1.73  cm2 Final  . LVOT peak grad rest 04/04/2016 11  mmHg Final  . E decel time 04/04/2016 306  msec Final  . LVOT diameter 04/04/2016 16  mm Final  . LVOT area 04/04/2016 2.01  cm2 Final  . LVOT peak vel 04/04/2016 163  cm/s Final  . LVOT peak VTI 04/04/2016 .86  cm Final  . Ao pk vel 04/04/2016 .92  m/s Final  . VTI 04/04/2016 45.3  cm Final  . LVOT SV 04/04/2016 79.00  mL Final  . Peak grad 04/04/2016 13  mmHg Final  . Peak grad 04/04/2016 6  mmHg Final  . E/e' ratio 04/04/2016 22.52   Final  . AO mean calculated velocity dopler 04/04/2016 118  cm/s Final  . MV pk E vel 04/04/2016 125  m/s Final  . MV pk A vel 04/04/2016 116  m/s Final  . LV sys vol 04/04/2016 16  14 - 42 mL Final  . LV sys vol index 04/04/2016 9.0  mL/m2 Final  . LV dias vol 04/04/2016 41* 46 - 106 mL Final  . LV dias vol index 04/04/2016 23.0  mL/m2 Final  . LA vol index 04/04/2016 27.1  mL/m2 Final  . Valve area index 04/04/2016 .98   Final  . AV peak Index 04/04/2016 1.04   Final  . MV Dec 04/04/2016 306   Final  . LA diam end sys 04/04/2016 33.00  mm Final  . Simpson's disk 04/04/2016 60.00   Final  . TDI e' medial 04/04/2016 5.66   Final  . TDI e' lateral 04/04/2016 5.55   Final  . Lateral S' vel 04/04/2016 16.80  cm/sec Final  . TAPSE 04/04/2016 25.20  mm Final  . Heparin Unfractionated 04/04/2016 0.57  0.30 - 0.70 IU/mL Final   Comment:        IF HEPARIN RESULTS ARE BELOW EXPECTED VALUES, AND PATIENT DOSAGE HAS BEEN CONFIRMED, SUGGEST FOLLOW UP TESTING OF ANTITHROMBIN III LEVELS.   Marland Kitchen Rest HR 04/04/2016 52  bpm Final  . Rest BP 04/04/2016 162/63  mmHg Final  . Peak HR 04/04/2016 78  bpm Final  . Peak BP 04/04/2016 187/79  mmHg Final  . SSS 04/04/2016 1   Final  . SRS 04/04/2016 1   Final  . SDS 04/04/2016 0   Final  . LHR 04/04/2016 0.21   Final  . TID 04/04/2016 1.06   Final  . LV sys vol 04/04/2016 6  mL Final  . LV dias vol  04/04/2016 42  46 - 106 mL Final  . Heparin Unfractionated 04/05/2016 <0.10* 0.30 - 0.70 IU/mL Final   Comment:        IF HEPARIN RESULTS ARE BELOW EXPECTED VALUES, AND PATIENT DOSAGE HAS BEEN CONFIRMED, SUGGEST FOLLOW UP TESTING OF ANTITHROMBIN III LEVELS.   . WBC 04/05/2016 4.7  4.0 - 10.5 K/uL Final  . RBC 04/05/2016 3.79* 3.87 - 5.11 MIL/uL Final  . Hemoglobin 04/05/2016 10.7* 12.0 - 15.0 g/dL Final  . HCT 04/05/2016 33.9* 36.0 - 46.0 % Final  . MCV 04/05/2016 89.4  78.0 - 100.0 fL Final  . MCH 04/05/2016 28.2  26.0 - 34.0 pg Final  . MCHC 04/05/2016 31.6  30.0 - 36.0 g/dL Final  . RDW 04/05/2016 15.4  11.5 - 15.5 % Final  . Platelets 04/05/2016 189  150 - 400 K/uL  Final    RADIOGRAPHIC STUDIES: I have personally reviewed the radiological images as listed and agree with the findings in the report  No results found.  ASSESSMENT/PLAN Left breast invasive ductal carcinoma ER+ (weakly positive), PR -, HER2 -  PLAN: Discussed treatment options with the patient. She definitely does not want mastectomy.  I have discussed with her that she is not a good candidate for chemo given her age. I doubt that neoadjuvant aromatase inhibitor will have much effect on shrinking her breast mass since she is only weakly ER positive.  I have recommended for her to proceed with lumpectomy with SLN biopsy followed by AI. Clinical studies have shown no benefit in adjuvant radiation in patients over 70. She is agreeable to this plan. I will send her back to Dr. Arnoldo Morale for surgery. RTC in 4 weeks for follow up.   All questions were answered. The patient knows to call the clinic with any problems, questions or concerns.  This note was electronically signed.    Twana First, MD  03/30/2017 9:27 AM

## 2017-04-18 ENCOUNTER — Ambulatory Visit (INDEPENDENT_AMBULATORY_CARE_PROVIDER_SITE_OTHER): Payer: Medicare Other | Admitting: General Surgery

## 2017-04-18 ENCOUNTER — Encounter: Payer: Self-pay | Admitting: General Surgery

## 2017-04-18 VITALS — BP 174/95 | HR 56 | Temp 97.8°F | Resp 18 | Ht 62.0 in | Wt 155.0 lb

## 2017-04-18 DIAGNOSIS — C50512 Malignant neoplasm of lower-outer quadrant of left female breast: Secondary | ICD-10-CM | POA: Diagnosis not present

## 2017-04-18 NOTE — H&P (Signed)
Carol Duarte; [id number]; 04-Nov-1924   HPI   Patient is a 83 year old white female who was referred to my care by Drs. Fanta/Jorosz for evaluation and treatment of a left breast cancer.  The patient states she found a lump several months ago but did not tell anybody.  She started having pain in the left breast.  She underwent a mammogram which showed the mass.  Core biopsy of the mass reveals invasive ductal carcinoma.  Patient denies a family history of breast cancer.  She denies any nipple discharge.  She currently has no pain. Past Medical History:  Diagnosis Date  . Hypertension     Past Surgical History:  Procedure Laterality Date  . APPENDECTOMY    . brain cyst removed    . CATARACT EXTRACTION W/PHACO Left 11/09/2015   Procedure: CATARACT EXTRACTION PHACO AND INTRAOCULAR LENS PLACEMENT LEFT EYE cde=8.97;  Surgeon: [name], MD;  Location: [facility];  Service: Ophthalmology;  Laterality: Left;  . TONSILLECTOMY AND ADENOIDECTOMY      Family History  Problem Relation Age of Onset  . Heart failure Mother   . Heart failure Father     Current Outpatient Prescriptions on File Prior to Visit  Medication Sig Dispense Refill  . acetaminophen (TYLENOL) 500 MG tablet Take 1,000 mg by mouth every 6 (six) hours as needed for mild pain or fever.     Marland Kitchen amLODipine (NORVASC) 5 MG tablet Take 5 mg by mouth daily.    Marland Kitchen aspirin EC 81 MG tablet Take 1 tablet (81 mg total) by mouth daily. 90 tablet 3  . labetalol (NORMODYNE) 100 MG tablet Take 1 tablet (100 mg total) by mouth 2 (two) times daily. 60 tablet 12  . oxybutynin (DITROPAN) 5 MG tablet Take 5 mg by mouth daily.     . pantoprazole (PROTONIX) 40 MG tablet Take 1 tablet (40 mg total) by mouth daily at 6 (six) AM. 30 tablet 12  . simvastatin (ZOCOR) 40 MG tablet Take 40 mg by mouth daily.     No current facility-administered medications on file prior to visit.     Allergies  Allergen Reactions  . Naproxen Nausea Only and Other (See Comments)    Chest pain    History  Alcohol Use No    History  Smoking Status  . Never Smoker  Smokeless Tobacco  . Former Systems developer    Review of Systems  Constitutional: Negative.   HENT: Negative.   Eyes: Negative.   Respiratory: Negative.   Cardiovascular: Negative.   Gastrointestinal: Negative.   Genitourinary: Negative.   Musculoskeletal: Negative.   Skin: Negative.   Neurological: Negative.   Endo/Heme/Allergies: Negative.   Psychiatric/Behavioral: Negative.     Objective   Vitals:   03/23/17 1153  BP: (!) 166/58  Pulse: (!) 52  Resp: 18  Temp: (!) 97.1 F (36.2 C)    Physical Exam  Constitutional: She is oriented to person, place, and time and well-developed, well-nourished, and in no distress.  HENT:  Head: Normocephalic and atraumatic.  Neck: Normal range of motion. Neck supple.  Cardiovascular: Normal rate, regular rhythm and normal heart sounds.   No murmur heard. Pulmonary/Chest: Effort normal and breath sounds normal. She has no wheezes.  Lymphadenopathy:    She has no cervical adenopathy.  Neurological: She is alert and oriented to person, place, and time.  Skin: Skin is warm and dry.  Vitals reviewed.   Breast: Left breast with dominant greater than 2 cm mass in the outer, lower  quadrant with associated skin dimpling.  No nipple discharge is noted.  Axilla is negative for palpable nodes.  Right breast examination reveals no dominant mass, nipple discharge, dimpling.  The axilla is negative for palpable nodes.    Mammography, ultrasound, and biopsy reports reviewed  Assessment   invasive ductal carcinoma of left breast Plan    I explained the surgical options for the patient including a left partial mastectomy with sentinel lymph node biopsy and postoperative radiation therapy as well as left modified radical mastectomy.  Patient was seen by oncology and was not a candidate for neoadjuvant therapy. She has elected to proceed with a left partial mastectomy. She  refuses sentinel lymph node biopsy. The risks and benefits of the procedure including bleeding, infection, unclear margins, and the possibility of recurrence of the rest cancer were fully explained to the patient, who gave informed consent.

## 2017-04-18 NOTE — Progress Notes (Signed)
Subjective:     Carol Duarte  Patient is here for follow-up discussion about her left breast cancer. She was seen by oncology and it was felt that she was not a candidate for neoadjuvant therapy. She has decided that she only wants a left partial mastectomy. She does not want a sentinel lymph node biopsy performed. Objective:    BP (!) 174/95   Pulse (!) 56   Temp 97.8 F (36.6 C)   Resp 18   Ht 5\' 2"  (1.575 m)   Wt 155 lb (70.3 kg)   BMI 28.35 kg/m   General:  alert, cooperative and no distress       Assessment:    Left breast cancer    Plan:   Given her age, she has decided to only have the breast cancer removed. She does not want any further therapy except possibly antiestrogen drug therapy. She does realize that she will not be completely staged because she refuses sentinel lymph node biopsy, but she is fine with that. We'll proceed with a left partial mastectomy on 04/24/2017. The risks and benefits of the procedure including bleeding, infection, incomplete margins, and the possibility of recurrence of the left breast cancer were fully explained to the patient, who gave informed consent.

## 2017-04-19 NOTE — Patient Instructions (Signed)
Carol Duarte  04/19/2017     @PREFPERIOPPHARMACY @   Your procedure is scheduled on  04/24/2017 .  Report to [facility] at  [facility].  Call this number if you have problems the morning of surgery:  [phone]   Remember:  Do not eat food or drink liquids after midnight.  Take these medicines the morning of surgery with A SIP OF WATER  Norvasc, labetolol, ditropan, protonix.   Do not wear jewelry, make-up or nail polish.  Do not wear lotions, powders, or perfumes, or deoderant.  Do not shave 48 hours prior to surgery.  Men may shave face and neck.  Do not bring valuables to the hospital.  [facility] is not responsible for any belongings or valuables.  Contacts, dentures or bridgework may not be worn into surgery.  Leave your suitcase in the car.  After surgery it may be brought to your room.  For patients admitted to the hospital, discharge time will be determined by your treatment team.  Patients discharged the day of surgery will not be allowed to drive home.   Name and phone number of your driver:  family Special instructions:  None  Please read over the following fact sheets that you were given. Anesthesia Post-op Instructions and Care and Recovery After Surgery      Partial Mastectomy With or Without Axillary Lymph Node Removal Partial mastectomy with or without axillary lymph node removal is a surgery to remove breast cancer. It is a type of breast-conserving surgery. This means that the cancerous tissue is removed but the breast remains intact. During this procedure, the tumor and a small rim of healthy tissue surrounding it will be removed. Lymph nodes under your arm may also be removed and tested to find out if the cancer has spread. Let your health care provider know about:  Any allergies you have.  All medicines you are taking, including vitamins, herbs, eye drops, creams, and over-the-counter medicines.  Previous problems you or members of your  family have had with the use of anesthetics.  Any blood disorders you have.  Any surgeries you have had.  Any medical conditions you have. What are the risks? Generally, this is a safe procedure. However, problems may occur, including:  A change in the way your breast looks and feels.  Breast pain.  Infection.  Bleeding.  Pain, swelling, weakness, or numbness in the arm on the side of your surgery.  What happens before the procedure?  Ask your health care provider about: ? Changing or stopping your regular medicines. This is especially important if you are taking diabetes medicines or blood thinners. ? Taking medicines such as aspirin and ibuprofen. These medicines can thin your blood. Do not take these medicines before your procedure if your health care provider instructs you not to.  Follow your health care provider's instructions about eating or drinking restrictions.  You may be checked for extra fluid around your lymph nodes (lymphedema). What happens during the procedure?  An IV tube will be inserted into one of your veins.  You will be given a medicine that makes you fall asleep (general anesthetic).  Your surgeon may mark your breast to indicate the location of your tumor and to plan the incision.  Your breast will be cleaned with a germ-killing solution (antiseptic).  Your surgeon will make an incision over the area of your breast where the tumor is located. This will usually  be a curved incision that follows the normal shape of your breast.  The tumor will be removed along with a portion of the tissue that surrounds it.  If the tumor is close to the muscles over your chest, some muscle tissue may also be removed.  The incision may extend to the lymph nodes under your arm, or a second incision may be made under your arm.  Lymph nodes under your arm may be removed.  You may have a drainage tube inserted into your incision to collect fluid that builds up after  surgery. This tube will be connected to a suction bulb.  Your incision or incisions will be closed with stitches (sutures).  A bandage (dressing) will be placed over your breast and under your arm. The procedure may vary among health care providers and hospitals. What happens after the procedure?  Your blood pressure, heart rate, breathing rate, and blood oxygen level will be monitored often until the medicines you were given have worn off.  You will be given pain medicine as needed.  You will be encouraged to get up and walk as soon as you can.  Your IV tube will be removed when you are able to eat and drink.  Your drain may be removed before you go home from the hospital, or you may be sent home with your drain and suction bulb. This information is not intended to replace advice given to you by your health care provider. Make sure you discuss any questions you have with your health care provider. Document Released: 12/30/2014 Document Revised: 04/21/2016 Document Reviewed: 04/30/2014 Elsevier Interactive Patient Education  2018 [place]. Partial Mastectomy With or Without Axillary Lymph Node Removal, Care After Refer to this sheet in the next few weeks. These instructions provide you with information about caring for yourself after your procedure. Your health care provider may also give you more specific instructions. Your treatment has been planned according to current medical practices, but problems sometimes occur. Call your health care provider if you have any problems or questions after your procedure. What can I expect after the procedure? After your procedure, it is common to have:  Breast swelling.  Breast tenderness.  Stiffness in your arm or shoulder.  A change in the shape and feel of your breast.  Follow these instructions at home: Bathing  Take sponge baths until your health care provider says that you can start showering or bathing.  Do not take baths, swim,  or use a hot tub until your health care provider approves. Incision care  There are many different ways to close and cover an incision, including stitches, skin glue, and adhesive strips. Follow your health care provider's instructions about: ? Incision care. ? Bandage (dressing) changes and removal. ? Incision closure removal.  Check your incision area every day for signs of infection. Watch for: ? Redness, swelling, or pain. ? Fluid, blood, or pus.  If you were sent home with a surgical drain in place, follow your health care provider's instructions for emptying it. Activity  Return to your normal activities as directed by your health care provider.  Avoid strenuous exercise.  Be careful to avoid any activities that could cause an injury to your arm on the side of your surgery.  Do not lift anything that is heavier than 10 lb (4.5 kg). Avoid lifting with the arm that is on the side of your surgery.  Do not carry heavy objects on your shoulder.  After your drain is  removed, you should perform exercises to keep your arm from getting stiff and swollen. Talk with your health care provider about which exercises are safe for you. General instructions  Take medicines only as directed by your health care provider.  Keep your dressing clean and dry.  You may eat what you usually do.  Wear a supportive bra as directed by your health care provider.  Keep your arm elevated when at rest.  Do not wear tight jewelry on your arm, wrist, or fingers on the side of your surgery.  Get checked for extra fluid around your lymph nodes (lymphedema) as often as told by your health care provider.  If you had any lymph nodes removed during your procedure, be sure to tell all of your health care providers. This is important information to share before you are involved in certain procedures, such as giving blood or having your blood pressure taken. Contact a health care provider if:  You have a  fever.  Your pain medicine is not working.  Your swelling, weakness, or numbness in your arm has not improved after a few weeks.  You have new swelling in your breast or arm.  You have redness, swelling, or pain in your incision area.  You have fluid, blood, or pus coming from your incision. Get help right away if:  You have very bad pain in your breast or arm.  You have chest pain.  You have difficulty breathing. This information is not intended to replace advice given to you by your health care provider. Make sure you discuss any questions you have with your health care provider. Document Released: 03/29/2004 Document Revised: 04/21/2016 Document Reviewed: 04/30/2014 Elsevier Interactive Patient Education  2018 [place] Anesthesia, Adult General anesthesia is the use of medicines to make a person "go to sleep" (be unconscious) for a medical procedure. General anesthesia is often recommended when a procedure:  Is long.  Requires you to be still or in an unusual position.  Is major and can cause you to lose blood.  Is impossible to do without general anesthesia.  The medicines used for general anesthesia are called general anesthetics. In addition to making you sleep, the medicines:  Prevent pain.  Control your blood pressure.  Relax your muscles.  Tell a health care provider about:  Any allergies you have.  All medicines you are taking, including vitamins, herbs, eye drops, creams, and over-the-counter medicines.  Any problems you or family members have had with anesthetic medicines.  Types of anesthetics you have had in the past.  Any bleeding disorders you have.  Any surgeries you have had.  Any medical conditions you have.  Any history of heart or lung conditions, such as heart failure, sleep apnea, or chronic obstructive pulmonary disease (COPD).  Whether you are pregnant or may be pregnant.  Whether you use tobacco, alcohol, marijuana,  or street drugs.  Any history of Armed forces logistics/support/administrative officer.  Any history of depression or anxiety. What are the risks? Generally, this is a safe procedure. However, problems may occur, including:  Allergic reaction to anesthetics.  Lung and heart problems.  Inhaling food or liquids from your stomach into your lungs (aspiration).  Injury to nerves.  Waking up during your procedure and being unable to move (rare).  Extreme agitation or a state of mental confusion (delirium) when you wake up from the anesthetic.  Air in the bloodstream, which can lead to stroke.  These problems are more likely to develop if  you are having a major surgery or if you have an advanced medical condition. You can prevent some of these complications by answering all of your health care provider's questions thoroughly and by following all pre-procedure instructions. General anesthesia can cause side effects, including:  Nausea or vomiting  A sore throat from the breathing tube.  Feeling cold or shivery.  Feeling tired, washed out, or achy.  Sleepiness or drowsiness.  Confusion or agitation.  What happens before the procedure? Staying hydrated Follow instructions from your health care provider about hydration, which may include:  Up to 2 hours before the procedure - you may continue to drink clear liquids, such as water, clear fruit juice, black coffee, and plain tea.  Eating and drinking restrictions Follow instructions from your health care provider about eating and drinking, which may include:  8 hours before the procedure - stop eating heavy meals or foods such as meat, fried foods, or fatty foods.  6 hours before the procedure - stop eating light meals or foods, such as toast or cereal.  6 hours before the procedure - stop drinking milk or drinks that contain milk.  2 hours before the procedure - stop drinking clear liquids.  Medicines  Ask your health care provider about: ? Changing or  stopping your regular medicines. This is especially important if you are taking diabetes medicines or blood thinners. ? Taking medicines such as aspirin and ibuprofen. These medicines can thin your blood. Do not take these medicines before your procedure if your health care provider instructs you not to. ? Taking new dietary supplements or medicines. Do not take these during the week before your procedure unless your health care provider approves them.  If you are told to take a medicine or to continue taking a medicine on the day of the procedure, take the medicine with sips of water. General instructions   Ask if you will be going home the same day, the following day, or after a longer hospital stay. ? Plan to have someone take you home. ? Plan to have someone stay with you for the first 24 hours after you leave the hospital or clinic.  For 3-6 weeks before the procedure, try not to use any tobacco products, such as cigarettes, chewing tobacco, and e-cigarettes.  You may brush your teeth on the morning of the procedure, but make sure to spit out the toothpaste. What happens during the procedure?  You will be given anesthetics through a mask and through an IV tube in one of your veins.  You may receive medicine to help you relax (sedative).  As soon as you are asleep, a breathing tube may be used to help you breathe.  An anesthesia specialist will stay with you throughout the procedure. He or she will help keep you comfortable and safe by continuing to give you medicines and adjusting the amount of medicine that you get. He or she will also watch your blood pressure, pulse, and oxygen levels to make sure that the anesthetics do not cause any problems.  If a breathing tube was used to help you breathe, it will be removed before you wake up. The procedure may vary among health care providers and hospitals. What happens after the procedure?  You will wake up, often slowly, after the  procedure is complete, usually in a recovery area.  Your blood pressure, heart rate, breathing rate, and blood oxygen level will be monitored until the medicines you were given have worn off.  You may be given medicine to help you calm down if you feel anxious or agitated.  If you will be going home the same day, your health care provider may check to make sure you can stand, drink, and urinate.  Your health care providers will treat your pain and side effects before you go home.  Do not drive for 24 hours if you received a sedative.  You may: ? Feel nauseous and vomit. ? Have a sore throat. ? Have mental slowness. ? Feel cold or shivery. ? Feel sleepy. ? Feel tired. ? Feel sore or achy, even in parts of your body where you did not have surgery. This information is not intended to replace advice given to you by your health care provider. Make sure you discuss any questions you have with your health care provider. Document Released: 11/22/2007 Document Revised: 01/26/2016 Document Reviewed: 07/30/2015 Elsevier Interactive Patient Education  2018 [place] Anesthesia, Adult, Care After These instructions provide you with information about caring for yourself after your procedure. Your health care provider may also give you more specific instructions. Your treatment has been planned according to current medical practices, but problems sometimes occur. Call your health care provider if you have any problems or questions after your procedure. What can I expect after the procedure? After the procedure, it is common to have:  Vomiting.  A sore throat.  Mental slowness.  It is common to feel:  Nauseous.  Cold or shivery.  Sleepy.  Tired.  Sore or achy, even in parts of your body where you did not have surgery.  Follow these instructions at home: For at least 24 hours after the procedure:  Do not: ? Participate in activities where you could fall or become  injured. ? Drive. ? Use heavy machinery. ? Drink alcohol. ? Take sleeping pills or medicines that cause drowsiness. ? Make important decisions or sign legal documents. ? Take care of children on your own.  Rest. Eating and drinking  If you vomit, drink water, juice, or soup when you can drink without vomiting.  Drink enough fluid to keep your urine clear or pale yellow.  Make sure you have little or no nausea before eating solid foods.  Follow the diet recommended by your health care provider. General instructions  Have a responsible adult stay with you until you are awake and alert.  Return to your normal activities as told by your health care provider. Ask your health care provider what activities are safe for you.  Take over-the-counter and prescription medicines only as told by your health care provider.  If you smoke, do not smoke without supervision.  Keep all follow-up visits as told by your health care provider. This is important. Contact a health care provider if:  You continue to have nausea or vomiting at home, and medicines are not helpful.  You cannot drink fluids or start eating again.  You cannot urinate after 8-12 hours.  You develop a skin rash.  You have fever.  You have increasing redness at the site of your procedure. Get help right away if:  You have difficulty breathing.  You have chest pain.  You have unexpected bleeding.  You feel that you are having a life-threatening or urgent problem. This information is not intended to replace advice given to you by your health care provider. Make sure you discuss any questions you have with your health care provider. Document Released: 11/21/2000 Document Revised: 01/18/2016 Document Reviewed: 07/30/2015 Elsevier Interactive  Patient Education  [employer].

## 2017-04-20 ENCOUNTER — Encounter (HOSPITAL_COMMUNITY): Payer: Self-pay

## 2017-04-20 ENCOUNTER — Ambulatory Visit (HOSPITAL_COMMUNITY)
Admission: RE | Admit: 2017-04-20 | Discharge: 2017-04-20 | Disposition: A | Discharge status: Home / Self Care | Payer: Medicare Other | Source: Ambulatory Visit | Attending: General Surgery | Admitting: General Surgery

## 2017-04-20 ENCOUNTER — Encounter (HOSPITAL_COMMUNITY)
Admission: RE | Admit: 2017-04-20 | Discharge: 2017-04-20 | Disposition: A | Discharge status: Home / Self Care | Payer: Medicare Other | Source: Ambulatory Visit | Attending: General Surgery | Admitting: General Surgery

## 2017-04-20 ENCOUNTER — Other Ambulatory Visit: Payer: Self-pay

## 2017-04-20 DIAGNOSIS — C50512 Malignant neoplasm of lower-outer quadrant of left female breast: Secondary | ICD-10-CM | POA: Diagnosis not present

## 2017-04-20 DIAGNOSIS — R001 Bradycardia, unspecified: Secondary | ICD-10-CM | POA: Diagnosis not present

## 2017-04-20 DIAGNOSIS — C50919 Malignant neoplasm of unspecified site of unspecified female breast: Secondary | ICD-10-CM | POA: Diagnosis not present

## 2017-04-20 DIAGNOSIS — K449 Diaphragmatic hernia without obstruction or gangrene: Secondary | ICD-10-CM | POA: Diagnosis not present

## 2017-04-20 DIAGNOSIS — Z01810 Encounter for preprocedural cardiovascular examination: Secondary | ICD-10-CM | POA: Diagnosis not present

## 2017-04-20 LAB — COMPREHENSIVE METABOLIC PANEL
ALK PHOS: 41 U/L (ref 38–126)
ALT: 20 U/L (ref 14–54)
ANION GAP: 8 (ref 5–15)
AST: 18 U/L (ref 15–41)
Albumin: 4.1 g/dL (ref 3.5–5.0)
BUN: 33 mg/dL — ABNORMAL HIGH (ref 6–20)
CO2: 23 mmol/L (ref 22–32)
CREATININE: 1.23 mg/dL — AB (ref 0.44–1.00)
Calcium: 9.3 mg/dL (ref 8.9–10.3)
Chloride: 107 mmol/L (ref 101–111)
GFR, EST AFRICAN AMERICAN: 43 mL/min — AB (ref >60)
GFR, EST NON AFRICAN AMERICAN: 37 mL/min — AB (ref >60)
Glucose, Bld: 122 mg/dL — ABNORMAL HIGH (ref 65–99)
Potassium: 4.6 mmol/L (ref 3.5–5.1)
SODIUM: 138 mmol/L (ref 135–145)
Total Bilirubin: 0.5 mg/dL (ref 0.3–1.2)
Total Protein: 6.5 g/dL (ref 6.5–8.1)

## 2017-04-20 LAB — CBC WITH DIFFERENTIAL/PLATELET
Basophils Absolute: 0 10*3/uL (ref 0.0–0.1)
Basophils Relative: 0 %
EOS ABS: 0.2 10*3/uL (ref 0.0–0.7)
EOS PCT: 4 %
HCT: 37.8 % (ref 36.0–46.0)
HEMOGLOBIN: 12.6 g/dL (ref 12.0–15.0)
LYMPHS ABS: 1.2 10*3/uL (ref 0.7–4.0)
LYMPHS PCT: 22 %
MCH: 31 pg (ref 26.0–34.0)
MCHC: 33.3 g/dL (ref 30.0–36.0)
MCV: 92.9 fL (ref 78.0–100.0)
MONOS PCT: 8 %
Monocytes Absolute: 0.4 10*3/uL (ref 0.1–1.0)
Neutro Abs: 3.8 10*3/uL (ref 1.7–7.7)
Neutrophils Relative %: 66 %
Platelets: 184 10*3/uL (ref 150–400)
RBC: 4.07 MIL/uL (ref 3.87–5.11)
RDW: 13.8 % (ref 11.5–15.5)
WBC: 5.7 10*3/uL (ref 4.0–10.5)

## 2017-04-20 LAB — SURGICAL PCR SCREEN
MRSA, PCR: NEGATIVE
Staphylococcus aureus: POSITIVE — AB

## 2017-04-21 MED ORDER — MUPIROCIN 2 % EX OINT
TOPICAL_OINTMENT | CUTANEOUS | Status: AC
  Filled 2017-04-21: qty 22

## 2017-04-24 ENCOUNTER — Encounter (HOSPITAL_COMMUNITY): Payer: Self-pay | Admitting: *Deleted

## 2017-04-24 ENCOUNTER — Ambulatory Visit (HOSPITAL_COMMUNITY)
Admission: RE | Admit: 2017-04-24 | Discharge: 2017-04-24 | Disposition: A | Discharge status: Home / Self Care | Payer: Medicare Other | Source: Ambulatory Visit | Attending: General Surgery | Admitting: General Surgery

## 2017-04-24 ENCOUNTER — Ambulatory Visit (HOSPITAL_COMMUNITY): Payer: Medicare Other | Admitting: Anesthesiology

## 2017-04-24 ENCOUNTER — Encounter (HOSPITAL_COMMUNITY)
Admission: RE | Disposition: A | Discharge status: Home / Self Care | Payer: Self-pay | Source: Ambulatory Visit | Attending: General Surgery

## 2017-04-24 DIAGNOSIS — C50912 Malignant neoplasm of unspecified site of left female breast: Secondary | ICD-10-CM | POA: Diagnosis not present

## 2017-04-24 DIAGNOSIS — Z7982 Long term (current) use of aspirin: Secondary | ICD-10-CM | POA: Insufficient documentation

## 2017-04-24 DIAGNOSIS — C50512 Malignant neoplasm of lower-outer quadrant of left female breast: Secondary | ICD-10-CM | POA: Insufficient documentation

## 2017-04-24 DIAGNOSIS — N644 Mastodynia: Secondary | ICD-10-CM | POA: Insufficient documentation

## 2017-04-24 DIAGNOSIS — I10 Essential (primary) hypertension: Secondary | ICD-10-CM | POA: Diagnosis not present

## 2017-04-24 DIAGNOSIS — Z79899 Other long term (current) drug therapy: Secondary | ICD-10-CM | POA: Insufficient documentation

## 2017-04-24 HISTORY — PX: MASTECTOMY, PARTIAL: SHX709

## 2017-04-24 SURGERY — MASTECTOMY PARTIAL
Anesthesia: General | Laterality: Left

## 2017-04-24 MED ORDER — ONDANSETRON 4 MG PO TBDP
4.0000 mg | ORAL_TABLET | Freq: Once | ORAL | Status: AC
  Administered 2017-04-24: 4 mg via ORAL

## 2017-04-24 MED ORDER — PROPOFOL 10 MG/ML IV BOLUS
INTRAVENOUS | Status: DC | PRN
  Administered 2017-04-24: 80 mg via INTRAVENOUS

## 2017-04-24 MED ORDER — CHLORHEXIDINE GLUCONATE CLOTH 2 % EX PADS: 6.0000 | MEDICATED_PAD | Freq: Once | CUTANEOUS | Status: DC

## 2017-04-24 MED ORDER — FENTANYL CITRATE (PF) 100 MCG/2ML IJ SOLN
INTRAMUSCULAR | Status: DC | PRN
  Administered 2017-04-24 (×2): 25 ug via INTRAVENOUS

## 2017-04-24 MED ORDER — LIDOCAINE HCL (PF) 1 % IJ SOLN
INTRAMUSCULAR | Status: AC
  Filled 2017-04-24: qty 5

## 2017-04-24 MED ORDER — SODIUM CHLORIDE 0.9 % IR SOLN
Status: DC | PRN
  Administered 2017-04-24: 1000 mL

## 2017-04-24 MED ORDER — BUPIVACAINE HCL (PF) 0.5 % IJ SOLN
INTRAMUSCULAR | Status: AC
  Filled 2017-04-24: qty 30

## 2017-04-24 MED ORDER — LACTATED RINGERS IV SOLN
INTRAVENOUS | Status: DC
  Administered 2017-04-24: 1000 mL via INTRAVENOUS

## 2017-04-24 MED ORDER — LIDOCAINE HCL 1 % IJ SOLN
INTRAMUSCULAR | Status: DC | PRN
  Administered 2017-04-24: 25 mg via INTRADERMAL

## 2017-04-24 MED ORDER — FENTANYL CITRATE (PF) 100 MCG/2ML IJ SOLN
INTRAMUSCULAR | Status: AC
Start: 2017-04-24 — End: 2017-04-24
  Filled 2017-04-24: qty 2

## 2017-04-24 MED ORDER — BUPIVACAINE HCL (PF) 0.5 % IJ SOLN
INTRAMUSCULAR | Status: DC | PRN
  Administered 2017-04-24: 10 mL

## 2017-04-24 MED ORDER — ONDANSETRON 4 MG PO TBDP
ORAL_TABLET | ORAL | Status: AC
  Filled 2017-04-24: qty 1

## 2017-04-24 MED ORDER — KETOROLAC TROMETHAMINE 30 MG/ML IJ SOLN
INTRAMUSCULAR | Status: AC
  Filled 2017-04-24: qty 1

## 2017-04-24 MED ORDER — KETOROLAC TROMETHAMINE 30 MG/ML IJ SOLN
15.0000 mg | Freq: Once | INTRAMUSCULAR | Status: AC
  Administered 2017-04-24: 15 mg via INTRAVENOUS

## 2017-04-24 MED ORDER — MIDAZOLAM HCL 2 MG/2ML IJ SOLN
1.0000 mg | Freq: Once | INTRAMUSCULAR | Status: AC | PRN
  Administered 2017-04-24: 2 mg via INTRAVENOUS

## 2017-04-24 MED ORDER — MIDAZOLAM HCL 2 MG/2ML IJ SOLN
INTRAMUSCULAR | Status: AC
  Filled 2017-04-24: qty 2

## 2017-04-24 SURGICAL SUPPLY — 27 items
ADH SKN CLS APL DERMABOND .7 (GAUZE/BANDAGES/DRESSINGS) ×1
BAG HAMPER (MISCELLANEOUS) ×3 IMPLANT
CLOTH BEACON ORANGE TIMEOUT ST (SAFETY) ×3 IMPLANT
COVER LIGHT HANDLE STERIS (MISCELLANEOUS) ×6 IMPLANT
DECANTER SPIKE VIAL GLASS SM (MISCELLANEOUS) ×3 IMPLANT
DERMABOND ADVANCED (GAUZE/BANDAGES/DRESSINGS) ×2
DERMABOND ADVANCED .7 DNX12 (GAUZE/BANDAGES/DRESSINGS) ×1 IMPLANT
DURAPREP 26ML APPLICATOR (WOUND CARE) ×3 IMPLANT
ELECT REM PT RETURN 9FT ADLT (ELECTROSURGICAL) ×3
ELECTRODE REM PT RTRN 9FT ADLT (ELECTROSURGICAL) ×1 IMPLANT
GLOVE BIOGEL PI IND STRL 7.0 (GLOVE) ×1 IMPLANT
GLOVE BIOGEL PI INDICATOR 7.0 (GLOVE) ×2
GLOVE SURG SS PI 7.5 STRL IVOR (GLOVE) ×6 IMPLANT
GOWN STRL REUS W/TWL LRG LVL3 (GOWN DISPOSABLE) ×6 IMPLANT
KIT ROOM TURNOVER APOR (KITS) ×3 IMPLANT
MANIFOLD NEPTUNE II (INSTRUMENTS) ×3 IMPLANT
NDL HYPO 25X1 1.5 SAFETY (NEEDLE) ×1 IMPLANT
NEEDLE HYPO 25X1 1.5 SAFETY (NEEDLE) ×3 IMPLANT
NS IRRIG 1000ML POUR BTL (IV SOLUTION) ×3 IMPLANT
PACK MINOR (CUSTOM PROCEDURE TRAY) ×3 IMPLANT
PAD ARMBOARD 7.5X6 YLW CONV (MISCELLANEOUS) ×3 IMPLANT
SET BASIN LINEN APH (SET/KITS/TRAYS/PACK) ×3 IMPLANT
SPONGE LAP 18X18 X RAY DECT (DISPOSABLE) ×3 IMPLANT
SUT VIC AB 3-0 SH 27 (SUTURE) ×3
SUT VIC AB 3-0 SH 27X BRD (SUTURE) ×1 IMPLANT
SUT VIC AB 4-0 PS2 27 (SUTURE) ×3 IMPLANT
SYR CONTROL 10ML LL (SYRINGE) ×3 IMPLANT

## 2017-04-24 NOTE — Anesthesia Procedure Notes (Signed)
Procedure Name: LMA Insertion Date/Time: 04/24/2017 9:22 AM Performed by: Charmaine Downs Pre-anesthesia Checklist: Patient identified, Patient being monitored, Emergency Drugs available, Timeout performed and Suction available Patient Re-evaluated:Patient Re-evaluated prior to induction Oxygen Delivery Method: Circle System Utilized Preoxygenation: Pre-oxygenation with 100% oxygen Induction Type: IV induction Ventilation: Mask ventilation without difficulty LMA: LMA inserted LMA Size: 3.0 Number of attempts: 1 Placement Confirmation: positive ETCO2 and breath sounds checked- equal and bilateral Tube secured with: Tape Dental Injury: Teeth and Oropharynx as per pre-operative assessment

## 2017-04-24 NOTE — Interval H&P Note (Signed)
History and Physical Interval Note:  04/24/2017 8:42 AM  Carol Duarte  has presented today for surgery, with the diagnosis of left breast cancer  The various methods of treatment have been discussed with the patient and family. After consideration of risks, benefits and other options for treatment, the patient has consented to  Procedure(s): MASTECTOMY PARTIAL (Left) as a surgical intervention .  The patient's history has been reviewed, patient examined, no change in status, stable for surgery.  I have reviewed the patient's chart and labs.  Questions were answered to the patient's satisfaction.     [name]

## 2017-04-24 NOTE — Anesthesia Postprocedure Evaluation (Signed)
Anesthesia Post Note  Patient: Carol Duarte  Procedure(s) Performed: Procedure(s) (LRB): MASTECTOMY PARTIAL (Left)  Patient location during evaluation: PACU Anesthesia Type: General Level of consciousness: awake and alert and patient cooperative Pain management: pain level controlled Vital Signs Assessment: post-procedure vital signs reviewed and stable Respiratory status: spontaneous breathing, nonlabored ventilation and respiratory function stable Cardiovascular status: blood pressure returned to baseline Postop Assessment: no signs of nausea or vomiting Anesthetic complications: no     Last Vitals:  Vitals:   04/24/17 0910 04/24/17 1018  BP: (!) 149/55 (!) (P) 145/51  Pulse:    Resp: 20 (P) 12  Temp:  (P) 36.5 C  SpO2: 99% (P) 100%    Last Pain:  Vitals:   04/24/17 1018  TempSrc:   PainSc: (P) 0-No pain                 Gaynor Ferreras J

## 2017-04-24 NOTE — Transfer of Care (Signed)
Immediate Anesthesia Transfer of Care Note  Patient: Carol Duarte  Procedure(s) Performed: Procedure(s): MASTECTOMY PARTIAL (Left)  Patient Location: PACU  Anesthesia Type:General  Level of Consciousness: awake and patient cooperative  Airway & Oxygen Therapy: Patient Spontanous Breathing and Patient connected to face mask oxygen  Post-op Assessment: Report given to RN, Post -op Vital signs reviewed and stable and Patient moving all extremities  Post vital signs: Reviewed and stable  Last Vitals:  Vitals:   04/24/17 0905 04/24/17 0910  BP: (!) 169/56 (!) 149/55  Pulse:    Resp: (!) 24 20  Temp:    SpO2: 99% 99%    Last Pain:  Vitals:   04/24/17 0755  TempSrc: Oral  PainSc: 0-No pain      Patients Stated Pain Goal: 6 (79/98/72 1587)  Complications: No apparent anesthesia complications

## 2017-04-24 NOTE — Op Note (Signed)
Patient:  Carol Duarte  DOB:  [birth year]  MRN:  [id number]   Preop Diagnosis:  Left breast cancer  Postop Diagnosis:  Same  Procedure:  Left partial mastectomy  Surgeon:  [name], M.D.  Asst.: Curlene Labrum, M.D.  Anes:  Gen.  Indications:  Patient is a 83 year old white female who presents with a left breast cancer. Her wishes are to only have a partial mastectomy. She does not want sentinel lymph node biopsy or any further therapy. The risks and benefits of the procedure including bleeding, infection, cardiopulmonary difficulties, and the possibility of recurrence of the breast cancer were fully explained to the patient, who gave informed consent.  Procedure note:  The patient was placed in the supine position. After general anesthesia was administered, the left breast was prepped and draped using usual sterile technique with DuraPrep. Surgical site confirmation was performed.  An elliptical incision was made in the lower, outer quadrant of the left breast. There was some skin dimpling where the breast cancer was, thus skin was excised above this. The dissection was taken down to the chest Sole. Normal appearing breast tissue was taken circumferentially around the mass. A short suture was placed superiorly and a long suture placed laterally for orientation purposes. The specimen was sent to pathology for examination. A bleeding was controlled using Bovie electrocautery. The subcutaneous layer was reapproximated using a 3-0 Vicryl interrupted suture. The skin was closed using a 4-0 Vicryl subcuticular suture. 0.5% Sensorcaine was instilled into the surrounding wound. Dermabond was applied.  All tape and needle counts were correct at the end the procedure. The patient was awakened and transferred to PACU in stable condition.  Complications:  None  EBL:  Minimal  Specimen:  Left breast tissue

## 2017-04-24 NOTE — Anesthesia Preprocedure Evaluation (Signed)
Anesthesia Evaluation  Patient identified by MRN, date of birth, ID band Patient awake    Airway Mallampati: III  TM Distance: >3 FB Neck ROM: Full    Dental  (+) Teeth Intact   Pulmonary neg pulmonary ROS,    Pulmonary exam normal breath sounds clear to auscultation       Cardiovascular hypertension, Pt. on medications and Pt. on home beta blockers + angina (denies CP/SOB)  Rhythm:Regular Rate:Normal     Neuro/Psych negative neurological ROS     GI/Hepatic negative GI ROS, Neg liver ROS,   Endo/Other  negative endocrine ROS  Renal/GU      Musculoskeletal negative musculoskeletal ROS (+)   Abdominal (+)  Abdomen: soft.    Peds  Hematology negative hematology ROS (+)   Anesthesia Other Findings   Reproductive/Obstetrics negative OB ROS                             Anesthesia Physical Anesthesia Plan  ASA: II  Anesthesia Plan: General   Post-op Pain Management:    Induction:   PONV Risk Score and Plan:   Airway Management Planned: LMA  Additional Equipment:   Intra-op Plan:   Post-operative Plan: Extubation in OR  Informed Consent: I have reviewed the patients History and Physical, chart, labs and discussed the procedure including the risks, benefits and alternatives for the proposed anesthesia with the patient or authorized representative who has indicated his/her understanding and acceptance.   Dental advisory given  Plan Discussed with: CRNA  Anesthesia Plan Comments:         Anesthesia Quick Evaluation

## 2017-04-24 NOTE — Discharge Instructions (Signed)
Partial Mastectomy With or Without Axillary Lymph Node Removal, Care After Refer to this sheet in the next few weeks. These instructions provide you with information about caring for yourself after your procedure. Your health care provider may also give you more specific instructions. Your treatment has been planned according to current medical practices, but problems sometimes occur. Call your health care provider if you have any problems or questions after your procedure. What can I expect after the procedure? After your procedure, it is common to have:  Breast swelling.  Breast tenderness.  Stiffness in your arm or shoulder.  A change in the shape and feel of your breast.  Follow these instructions at home: Bathing  Take sponge baths until your health care provider says that you can start showering or bathing.  Do not take baths, swim, or use a hot tub until your health care provider approves. Incision care  There are many different ways to close and cover an incision, including stitches, skin glue, and adhesive strips. Follow your health care provider's instructions about: ? Incision care. ? Bandage (dressing) changes and removal. ? Incision closure removal.  Check your incision area every day for signs of infection. Watch for: ? Redness, swelling, or pain. ? Fluid, blood, or pus.  If you were sent home with a surgical drain in place, follow your health care provider's instructions for emptying it. Activity  Return to your normal activities as directed by your health care provider.  Avoid strenuous exercise.  Be careful to avoid any activities that could cause an injury to your arm on the side of your surgery.  Do not lift anything that is heavier than 10 lb (4.5 kg). Avoid lifting with the arm that is on the side of your surgery.  Do not carry heavy objects on your shoulder.  After your drain is removed, you should perform exercises to keep your arm from getting stiff  and swollen. Talk with your health care provider about which exercises are safe for you. General instructions  Take medicines only as directed by your health care provider.  Keep your dressing clean and dry.  You may eat what you usually do.  Wear a supportive bra as directed by your health care provider.  Keep your arm elevated when at rest.  Do not wear tight jewelry on your arm, wrist, or fingers on the side of your surgery.  Get checked for extra fluid around your lymph nodes (lymphedema) as often as told by your health care provider.  If you had any lymph nodes removed during your procedure, be sure to tell all of your health care providers. This is important information to share before you are involved in certain procedures, such as giving blood or having your blood pressure taken. Contact a health care provider if:  You have a fever.  Your pain medicine is not working.  Your swelling, weakness, or numbness in your arm has not improved after a few weeks.  You have new swelling in your breast or arm.  You have redness, swelling, or pain in your incision area.  You have fluid, blood, or pus coming from your incision. Get help right away if:  You have very bad pain in your breast or arm.  You have chest pain.  You have difficulty breathing. This information is not intended to replace advice given to you by your health care provider. Make sure you discuss any questions you have with your health care provider. Document Released: 03/29/2004 Document  Revised: 04/21/2016 Document Reviewed: 04/30/2014 Elsevier Interactive Patient Education  2018 [place]   PATIENT INSTRUCTIONS POST-ANESTHESIA  IMMEDIATELY FOLLOWING SURGERY:  Do not drive or operate machinery for the first twenty four hours after surgery.  Do not make any important decisions for twenty four hours after surgery or while taking narcotic pain medications or sedatives.  If you develop intractable nausea  and vomiting or a severe headache please notify your doctor immediately.  FOLLOW-UP:  Please make an appointment with your surgeon as instructed. You do not need to follow up with anesthesia unless specifically instructed to do so.  WOUND CARE INSTRUCTIONS (if applicable):  Keep a dry clean dressing on the anesthesia/puncture wound site if there is drainage.  Once the wound has quit draining you may leave it open to air.  Generally you should leave the bandage intact for twenty four hours unless there is drainage.  If the epidural site drains for more than 36-48 hours please call the anesthesia department.  QUESTIONS?:  Please feel free to call your physician or the hospital operator if you have any questions, and they will be happy to assist you.

## 2017-04-25 ENCOUNTER — Encounter (HOSPITAL_COMMUNITY): Payer: Self-pay | Admitting: General Surgery

## 2017-04-27 ENCOUNTER — Ambulatory Visit (HOSPITAL_COMMUNITY): Payer: Medicare Other

## 2017-05-02 ENCOUNTER — Ambulatory Visit (INDEPENDENT_AMBULATORY_CARE_PROVIDER_SITE_OTHER): Payer: Self-pay | Admitting: General Surgery

## 2017-05-02 ENCOUNTER — Encounter (HOSPITAL_COMMUNITY): Payer: Medicare Other | Attending: Oncology | Admitting: Oncology

## 2017-05-02 ENCOUNTER — Encounter (HOSPITAL_COMMUNITY): Payer: Self-pay

## 2017-05-02 ENCOUNTER — Encounter: Payer: Self-pay | Admitting: General Surgery

## 2017-05-02 VITALS — BP 173/74 | HR 58 | Temp 97.8°F | Resp 18 | Ht 62.0 in | Wt 150.0 lb

## 2017-05-02 VITALS — BP 180/54 | HR 69 | Resp 18 | Ht 62.5 in | Wt 150.0 lb

## 2017-05-02 DIAGNOSIS — C50912 Malignant neoplasm of unspecified site of left female breast: Secondary | ICD-10-CM | POA: Diagnosis not present

## 2017-05-02 DIAGNOSIS — Z170 Estrogen receptor positive status [ER+]: Secondary | ICD-10-CM | POA: Diagnosis not present

## 2017-05-02 DIAGNOSIS — Z09 Encounter for follow-up examination after completed treatment for conditions other than malignant neoplasm: Secondary | ICD-10-CM

## 2017-05-02 DIAGNOSIS — C50512 Malignant neoplasm of lower-outer quadrant of left female breast: Secondary | ICD-10-CM | POA: Insufficient documentation

## 2017-05-02 NOTE — Progress Notes (Signed)
Subjective:     Carol Duarte  Status post left partial mastectomy. Doing very well. Has no complaints. Has returned to her normal activity. Objective:    BP (!) 173/74   Pulse (!) 58   Temp 97.8 F (36.6 C)   Resp 18   Ht 5\' 2"  (1.575 m)   Wt 150 lb (68 kg)   BMI 27.44 kg/m   General:  alert, cooperative and no distress   Left breast incision healing well. Final pathology consistent with diagnosis. Margins clear.     Assessment:    Doing well postoperatively.    Plan:   To follow up with oncology later this afternoon. Follow-up here as needed.

## 2017-05-02 NOTE — Progress Notes (Signed)
[facility] Initial Visit:  Patient Care Team: [name], MD as PCP - General (Internal Medicine)  CHIEF COMPLAINTS/PURPOSE OF CONSULTATION:  Left breast cancer  HISTORY OF PRESENTING ILLNESS: Carol Duarte 83 y.o. female postmenopausal presents today for evaluation of left breast invasive ductal carcinoma. Patient has not had a mammogram since she was in her 18s. Over 2 months ago she started having pain in her left breast and palpated a lump. On 03/14/17 she underwent diagnostic bilateral mammogram with left breast ultrasound which demonstrated a 2 x 2.1 x 2.8 cm irregular hypoechoic mass at the 3:00 position 4 cm from the nipple. No definite lymphadenopathy seen in the left axilla. Ultrasound-guided biopsy was performed on 03/14/17 which demonstrated Nottingham grade 3 invasive ductal carcinoma, ER 20% positive (weakly staining), PR 0%, HER-2 negative. Patient denies any previous history of breast biopsies. She denies any family history of breast cancer. She currently lives with her daughter. She denies any chest pain, shortness breath, abdominal pain, change in appetite, focal weakness, fatigue. She has seen Dr. Arnoldo Morale in surgical consultation on 03/23/17 and at that time patient was hesitant about any surgery. Therefore she was referred here for neoadjuvant treatment consideration.  INTERVAL HISTORY: Patient presented for follow-up today. Patient underwent a left partial mastectomy on 04/24/17, without sentinel lymph node biopsy per patient's wishes. Patient stated she tolerated the surgery very well. She states that she is healing well and has not needed any pain medications. She denies any chest pain, shortness breath, abdominal pain or any other complaints.     Review of Systems  Constitutional: Negative for appetite change, chills, fatigue and fever.  HENT:   Negative for hearing loss, lump/mass, mouth sores, sore throat and tinnitus.   Eyes: Negative for eye  problems and icterus.  Respiratory: Negative for chest tightness, cough, hemoptysis, shortness of breath and wheezing.   Cardiovascular: Negative for chest pain, leg swelling and palpitations.  Gastrointestinal: Negative for abdominal distention, abdominal pain, blood in stool, diarrhea, nausea and vomiting.  Endocrine: Negative.  Negative for hot flashes.  Genitourinary: Negative for difficulty urinating, frequency and hematuria.   Musculoskeletal: Negative for arthralgias and neck pain.  Skin: Negative for itching and rash.  Neurological: Negative for dizziness, headaches and speech difficulty.  Hematological: Negative for adenopathy. Does not bruise/bleed easily.  Psychiatric/Behavioral: Negative for confusion. The patient is not nervous/anxious.     MEDICAL HISTORY: Past Medical History:  Diagnosis Date  . Hypertension     SURGICAL HISTORY: Past Surgical History:  Procedure Laterality Date  . APPENDECTOMY    . brain cyst removed    . CATARACT EXTRACTION W/PHACO Left 11/09/2015   Procedure: CATARACT EXTRACTION PHACO AND INTRAOCULAR LENS PLACEMENT LEFT EYE cde=8.97;  Surgeon: [name], MD;  Location: [facility];  Service: Ophthalmology;  Laterality: Left;  . EYE SURGERY     KPE left  . MASTECTOMY, PARTIAL Left 04/24/2017   Procedure: MASTECTOMY PARTIAL;  Surgeon: [name], MD;  Location: [facility];  Service: General;  Laterality: Left;  . TONSILLECTOMY    . TONSILLECTOMY AND ADENOIDECTOMY      SOCIAL HISTORY: Social History   Social History  . Marital status: Widowed    Spouse name: N/A  . Number of children: N/A  . Years of education: N/A   Occupational History  . Not on file.   Social History Main Topics  . Smoking status: Never Smoker  . Smokeless tobacco: Former Systems developer  . Alcohol use No  . Drug  use: No  . Sexual activity: Not Currently    Partners: Male   Other Topics Concern  . Not on file   Social History Narrative  . No narrative on file    FAMILY  HISTORY Family History  Problem Relation Age of Onset  . Heart failure Mother   . Heart failure Father     ALLERGIES:  has No Known Allergies.  MEDICATIONS:  Current Outpatient Prescriptions  Medication Sig Dispense Refill  . acetaminophen (TYLENOL) 500 MG tablet Take 1,000 mg by mouth every 6 (six) hours as needed for mild pain or fever.     Marland Kitchen amLODipine (NORVASC) 5 MG tablet Take 5 mg by mouth daily.    Marland Kitchen aspirin EC 81 MG tablet Take 1 tablet (81 mg total) by mouth daily. 90 tablet 3  . ferrous sulfate 325 (65 FE) MG tablet Take 325 mg by mouth daily with breakfast.    . labetalol (NORMODYNE) 100 MG tablet Take 1 tablet (100 mg total) by mouth 2 (two) times daily. 60 tablet 12  . oxybutynin (DITROPAN) 5 MG tablet Take 5 mg by mouth daily.     . simvastatin (ZOCOR) 40 MG tablet Take 40 mg by mouth daily.     No current facility-administered medications for this visit.     PHYSICAL EXAMINATION:  ECOG PERFORMANCE STATUS: 1 - Symptomatic but completely ambulatory   Vitals:   05/02/17 1602  BP: (!) 180/54  Pulse: 69  Resp: 18  SpO2: 95%    Filed Weights   05/02/17 1602  Weight: 150 lb (68 kg)     Physical Exam  Constitutional: She is oriented to person, place, and time and well-developed, well-nourished, and in no distress. No distress.  HENT:  Head: Normocephalic and atraumatic.  Mouth/Throat: No oropharyngeal exudate.  Eyes: Pupils are equal, round, and reactive to light. Conjunctivae are normal. No scleral icterus.  Neck: Normal range of motion. Neck supple. No JVD present.  Cardiovascular: Normal rate, regular rhythm and normal heart sounds.  Exam reveals no gallop and no friction rub.   No murmur heard. Pulmonary/Chest: Breath sounds normal. No respiratory distress. She has no wheezes. She has no rales. Right breast exhibits no inverted nipple, no mass, no nipple discharge, no skin change and no tenderness.    Abdominal: Soft. Bowel sounds are normal. She  exhibits no distension. There is no tenderness. There is no guarding.  Musculoskeletal: She exhibits no edema (bilateral ankle edema 1+) or tenderness.  Lymphadenopathy:    She has no cervical adenopathy.  Neurological: She is alert and oriented to person, place, and time. No cranial nerve deficit.  Skin: Skin is warm and dry. No rash noted. No erythema. No pallor.  Psychiatric: Affect and judgment normal.     LABORATORY DATA: I have personally reviewed the data as listed:  Hospital Outpatient Visit on 04/20/2017  Component Date Value Ref Range Status  . MRSA, PCR 04/20/2017 NEGATIVE  NEGATIVE Final  . Staphylococcus aureus 04/20/2017 POSITIVE* NEGATIVE Final   Comment:        The Xpert SA Assay (FDA approved for NASAL specimens in patients over 30 years of age), is one component of a comprehensive surveillance program.  Test performance has been validated by [facility] for patients greater than or equal to 1 year old. It is not intended to diagnose infection nor to guide or monitor treatment. RESULT CALLED TO, READ BACK BY AND VERIFIED WITH: LEFT MESSAGE ON SCHEDULAR VOICE MAIL _0  BY MATTHEWS,B  8.23.18   . WBC 04/20/2017 5.7  4.0 - 10.5 K/uL Final  . RBC 04/20/2017 4.07  3.87 - 5.11 MIL/uL Final  . Hemoglobin 04/20/2017 12.6  12.0 - 15.0 g/dL Final  . HCT 04/20/2017 37.8  36.0 - 46.0 % Final  . MCV 04/20/2017 92.9  78.0 - 100.0 fL Final  . MCH 04/20/2017 31.0  26.0 - 34.0 pg Final  . MCHC 04/20/2017 33.3  30.0 - 36.0 g/dL Final  . RDW 04/20/2017 13.8  11.5 - 15.5 % Final  . Platelets 04/20/2017 184  150 - 400 K/uL Final  . Neutrophils Relative % 04/20/2017 66  % Final  . Neutro Abs 04/20/2017 3.8  1.7 - 7.7 K/uL Final  . Lymphocytes Relative 04/20/2017 22  % Final  . Lymphs Abs 04/20/2017 1.2  0.7 - 4.0 K/uL Final  . Monocytes Relative 04/20/2017 8  % Final  . Monocytes Absolute 04/20/2017 0.4  0.1 - 1.0 K/uL Final  . Eosinophils Relative 04/20/2017 4  % Final  .  Eosinophils Absolute 04/20/2017 0.2  0.0 - 0.7 K/uL Final  . Basophils Relative 04/20/2017 0  % Final  . Basophils Absolute 04/20/2017 0.0  0.0 - 0.1 K/uL Final  . Sodium 04/20/2017 138  135 - 145 mmol/L Final  . Potassium 04/20/2017 4.6  3.5 - 5.1 mmol/L Final  . Chloride 04/20/2017 107  101 - 111 mmol/L Final  . CO2 04/20/2017 23  22 - 32 mmol/L Final  . Glucose, Bld 04/20/2017 122* 65 - 99 mg/dL Final  . BUN 04/20/2017 33* 6 - 20 mg/dL Final  . Creatinine, Ser 04/20/2017 1.23* 0.44 - 1.00 mg/dL Final  . Calcium 04/20/2017 9.3  8.9 - 10.3 mg/dL Final  . Total Protein 04/20/2017 6.5  6.5 - 8.1 g/dL Final  . Albumin 04/20/2017 4.1  3.5 - 5.0 g/dL Final  . AST 04/20/2017 18  15 - 41 U/L Final  . ALT 04/20/2017 20  14 - 54 U/L Final  . Alkaline Phosphatase 04/20/2017 41  38 - 126 U/L Final  . Total Bilirubin 04/20/2017 0.5  0.3 - 1.2 mg/dL Final  . GFR calc non Af Amer 04/20/2017 37* >60 mL/min Final  . GFR calc Af Amer 04/20/2017 43* >60 mL/min Final   Comment: (NOTE) The eGFR has been calculated using the CKD EPI equation. This calculation has not been validated in all clinical situations. eGFR's persistently <60 mL/min signify possible Chronic Kidney Disease.   . Anion gap 04/20/2017 8  5 - 15 Final    RADIOGRAPHIC STUDIES: I have personally reviewed the radiological images as listed and agree with the findings in the report  PATHOLOGY: Paient: Karrer, Maxyne Collected: 04/24/2017 Client: [facility] Accession: [id number] Received: 04/24/2017 [name] DOB: 02-Aug-1925 Age: 64 Gender: F Reported: 04/26/2017 [address] Patient Ph: [phone] MRN #: [id number] [place], [place] [address] Visit #: [id number] Chart #: Phone: [phone] Fax: CC: REPORT OF SURGICAL PATHOLOGY FINAL DIAGNOSIS Diagnosis Breast, partial mastectomy, left - INVASIVE DUCTAL CARCINOMA, 4.2 CM, MSBR GRADE III. - DUCTAL CARCINOMA INS SITU WITH NECROSIS. - INVASIVE CARCINOMA FOCALLY 0.1 CM  FROM THE POSTERIOR AND ANTERIOR MARGINS. Microscopic Comment BREAST, INVASIVE TUMOR Procedure: Partial mastectomy Laterality: Left Tumor Size: 4.2 cm Histologic Type: Ductal Grade: III Tubular Differentiation: 3 Nuclear Pleomorphism: 3 Mitotic Count: 2 Ductal Carcinoma in Situ (DCIS): Present, high grade Extent of Tumor: Skin: Free of tumor Nipple: Free of tumor Skeletal muscle: Free of tumor Margins: Free of tumor Invasive carcinoma, distance  from closest margin: 0.1 cm from posterior and anterior margins DCIS, distance from closest margin: 0.4 cm from posterior margin Regional Lymph Nodes: Number of Lymph Nodes Examined: 0 Number of Sentinel Lymph Nodes Examined: 0 Lymph Nodes with Macrometastases: N/A Lymph Nodes with Micrometastases: N/A Lymph Nodes with Isolated Tumor Cells: N/A Breast Prognostic Profile Case [id number]: Estrogen Receptor: 20%, positive, weak staining Progesterone Receptor: 0%, negative 1 of 2 FINAL for Carol Duarte, Carol Duarte ([id number]) Microscopic Comment(continued) Her2: Negative, ratio 1.52 Ki-67: 20% Best tumor block for sendout testing: 1A Pathologic Stage Classification (pTNM, AJCC 8th Edition): Primary Tumor (pT): pT2 Regional Lymph Nodes (pN): pNX Distant Metastases (pM): pMX (JHP:kh 04-26-17) Claudette Laws MD Pathologist, Electronic Signature  ASSESSMENT/PLAN Left breast invasive ductal carcinoma ER+ (weakly positive), PR -, HER2 - s/p left partial mastectomy without SLN biopsy on 04/24/17. Final surgical path pT2 pNx pMx.  PLAN: -Discussed her surgical path from her mastectomy in detail. Patient declined SLN biopsy. I have discussed with her that since she did not have SLN biopsy, she is inadequately staged. I have discussed her hormone profile with her in detail and told her that her ER is weakly positive and asked if she would consider taking adjuvant AI, however patient refused. She does not want any further treatment beyond her surgery. -RTC in 6  months for follow up and exam. Diagnostic left mammogram in 6 months.   Orders Placed This Encounter  Procedures  . MM Digital Diagnostic Unilat L    Standing Status:   Future    Standing Expiration Date:   05/02/2018    Order Specific Question:   Reason for Exam (SYMPTOM  OR DIAGNOSIS REQUIRED)    Answer:   surveillance mammo    Order Specific Question:   Preferred imaging location?    Answer:   [facility]    All questions were answered. The patient knows to call the clinic with any problems, questions or concerns.  This note was electronically signed.    Twana First, MD  05/02/2017 4:07 PM

## 2017-06-19 DIAGNOSIS — I10 Essential (primary) hypertension: Secondary | ICD-10-CM | POA: Diagnosis not present

## 2017-06-19 DIAGNOSIS — E785 Hyperlipidemia, unspecified: Secondary | ICD-10-CM | POA: Diagnosis not present

## 2017-06-19 DIAGNOSIS — C50912 Malignant neoplasm of unspecified site of left female breast: Secondary | ICD-10-CM | POA: Diagnosis not present

## 2017-06-19 DIAGNOSIS — Z23 Encounter for immunization: Secondary | ICD-10-CM | POA: Diagnosis not present

## 2017-06-19 DIAGNOSIS — D500 Iron deficiency anemia secondary to blood loss (chronic): Secondary | ICD-10-CM | POA: Diagnosis not present

## 2017-06-28 DIAGNOSIS — I10 Essential (primary) hypertension: Secondary | ICD-10-CM | POA: Diagnosis not present

## 2017-06-28 DIAGNOSIS — G930 Cerebral cysts: Secondary | ICD-10-CM | POA: Diagnosis not present

## 2017-06-28 DIAGNOSIS — D649 Anemia, unspecified: Secondary | ICD-10-CM | POA: Diagnosis not present

## 2017-06-28 DIAGNOSIS — E1165 Type 2 diabetes mellitus with hyperglycemia: Secondary | ICD-10-CM | POA: Diagnosis not present

## 2017-08-15 DIAGNOSIS — I10 Essential (primary) hypertension: Secondary | ICD-10-CM | POA: Diagnosis not present

## 2017-08-15 DIAGNOSIS — D500 Iron deficiency anemia secondary to blood loss (chronic): Secondary | ICD-10-CM | POA: Diagnosis not present

## 2017-08-15 DIAGNOSIS — T148XXS Other injury of unspecified body region, sequela: Secondary | ICD-10-CM | POA: Diagnosis not present

## 2017-08-15 DIAGNOSIS — E785 Hyperlipidemia, unspecified: Secondary | ICD-10-CM | POA: Diagnosis not present

## 2017-09-19 DIAGNOSIS — D649 Anemia, unspecified: Secondary | ICD-10-CM | POA: Diagnosis not present

## 2017-09-19 DIAGNOSIS — E785 Hyperlipidemia, unspecified: Secondary | ICD-10-CM | POA: Diagnosis not present

## 2017-09-19 DIAGNOSIS — E1165 Type 2 diabetes mellitus with hyperglycemia: Secondary | ICD-10-CM | POA: Diagnosis not present

## 2017-09-19 DIAGNOSIS — I10 Essential (primary) hypertension: Secondary | ICD-10-CM | POA: Diagnosis not present

## 2017-09-19 DIAGNOSIS — Z0001 Encounter for general adult medical examination with abnormal findings: Secondary | ICD-10-CM | POA: Diagnosis not present

## 2017-09-19 DIAGNOSIS — G930 Cerebral cysts: Secondary | ICD-10-CM | POA: Diagnosis not present

## 2017-09-19 DIAGNOSIS — Z0000 Encounter for general adult medical examination without abnormal findings: Secondary | ICD-10-CM | POA: Diagnosis not present

## 2017-09-19 DIAGNOSIS — Z1389 Encounter for screening for other disorder: Secondary | ICD-10-CM | POA: Diagnosis not present

## 2017-09-19 DIAGNOSIS — C50912 Malignant neoplasm of unspecified site of left female breast: Secondary | ICD-10-CM | POA: Diagnosis not present

## 2017-10-18 ENCOUNTER — Other Ambulatory Visit: Payer: Self-pay | Admitting: Oncology

## 2017-10-18 DIAGNOSIS — Z853 Personal history of malignant neoplasm of breast: Secondary | ICD-10-CM

## 2017-10-31 ENCOUNTER — Encounter (HOSPITAL_COMMUNITY): Payer: Medicare Other

## 2017-11-06 ENCOUNTER — Ambulatory Visit (HOSPITAL_COMMUNITY): Payer: Medicare Other | Admitting: Internal Medicine

## 2017-11-27 DIAGNOSIS — R609 Edema, unspecified: Secondary | ICD-10-CM | POA: Diagnosis not present

## 2017-11-27 DIAGNOSIS — D500 Iron deficiency anemia secondary to blood loss (chronic): Secondary | ICD-10-CM | POA: Diagnosis not present

## 2017-11-27 DIAGNOSIS — E785 Hyperlipidemia, unspecified: Secondary | ICD-10-CM | POA: Diagnosis not present

## 2017-11-27 DIAGNOSIS — I10 Essential (primary) hypertension: Secondary | ICD-10-CM | POA: Diagnosis not present

## 2017-12-20 DIAGNOSIS — W19XXXA Unspecified fall, initial encounter: Secondary | ICD-10-CM | POA: Diagnosis not present

## 2017-12-20 DIAGNOSIS — S0083XA Contusion of other part of head, initial encounter: Secondary | ICD-10-CM | POA: Diagnosis not present

## 2017-12-20 DIAGNOSIS — I10 Essential (primary) hypertension: Secondary | ICD-10-CM | POA: Diagnosis not present

## 2018-02-26 DIAGNOSIS — I10 Essential (primary) hypertension: Secondary | ICD-10-CM | POA: Diagnosis not present

## 2018-02-26 DIAGNOSIS — N6320 Unspecified lump in the left breast, unspecified quadrant: Secondary | ICD-10-CM | POA: Diagnosis not present

## 2018-02-26 DIAGNOSIS — E785 Hyperlipidemia, unspecified: Secondary | ICD-10-CM | POA: Diagnosis not present

## 2018-03-13 ENCOUNTER — Ambulatory Visit: Payer: Medicare Other | Admitting: General Surgery

## 2018-03-13 ENCOUNTER — Encounter: Payer: Self-pay | Admitting: General Surgery

## 2018-03-13 VITALS — BP 178/97 | HR 58 | Temp 96.9°F | Resp 20 | Wt 143.0 lb

## 2018-03-13 DIAGNOSIS — C50512 Malignant neoplasm of lower-outer quadrant of left female breast: Secondary | ICD-10-CM | POA: Diagnosis not present

## 2018-03-13 DIAGNOSIS — N6320 Unspecified lump in the left breast, unspecified quadrant: Secondary | ICD-10-CM | POA: Diagnosis not present

## 2018-03-13 NOTE — Progress Notes (Signed)
Carol Duarte; [id number]; [birth year]   HPI Patient is a 84 year old white female who was referred back to my care by Dr. Legrand Rams for evaluation treatment of a left breast mass.  She developed a tender area along the medial aspect of her previous left partial mastectomy scar.  She underwent a left partial mastectomy in 2018 for invasive ductal carcinoma.  She did not undergo chemotherapy or radiation therapy after the surgery.  She states she noticed the increased thickness and tenderness just below her left nipple.  She has not had a mammogram since the surgery as she says it hurts too much.  She currently has 0 out of 10 left breast pain.  She denies nipple discharge. Past Medical History:  Diagnosis Date  . Hypertension     Past Surgical History:  Procedure Laterality Date  . APPENDECTOMY    . brain cyst removed    . CATARACT EXTRACTION W/PHACO Left 11/09/2015   Procedure: CATARACT EXTRACTION PHACO AND INTRAOCULAR LENS PLACEMENT LEFT EYE cde=8.97;  Surgeon: [name], MD;  Location: [facility];  Service: Ophthalmology;  Laterality: Left;  . EYE SURGERY     KPE left  . MASTECTOMY, PARTIAL Left 04/24/2017   Procedure: MASTECTOMY PARTIAL;  Surgeon: [name], MD;  Location: [facility];  Service: General;  Laterality: Left;  . TONSILLECTOMY    . TONSILLECTOMY AND ADENOIDECTOMY      Family History  Problem Relation Age of Onset  . Heart failure Mother   . Heart failure Father     Current Outpatient Medications on File Prior to Visit  Medication Sig Dispense Refill  . acetaminophen (TYLENOL) 500 MG tablet Take 1,000 mg by mouth every 6 (six) hours as needed for mild pain or fever.     Marland Kitchen amLODipine (NORVASC) 5 MG tablet Take 5 mg by mouth daily.    Marland Kitchen aspirin EC 81 MG tablet Take 1 tablet (81 mg total) by mouth daily. 90 tablet 3  . ferrous sulfate 325 (65 FE) MG tablet Take 325 mg by mouth daily with breakfast.    . labetalol (NORMODYNE) 100 MG tablet Take 1 tablet (100 mg total) by mouth 2 (two)  times daily. 60 tablet 12  . oxybutynin (DITROPAN) 5 MG tablet Take 5 mg by mouth daily.     . simvastatin (ZOCOR) 40 MG tablet Take 40 mg by mouth daily.     No current facility-administered medications on file prior to visit.     No Known Allergies  Social History   Substance and Sexual Activity  Alcohol Use No    Social History   Tobacco Use  Smoking Status Never Smoker  Smokeless Tobacco Former Systems developer    Review of Systems  Constitutional: Negative.   HENT: Negative.   Eyes: Negative.   Respiratory: Negative.   Cardiovascular: Negative.   Gastrointestinal: Negative.   Genitourinary: Negative.   Musculoskeletal: Negative.   Skin: Negative.   Neurological: Negative.   Endo/Heme/Allergies: Negative.   Psychiatric/Behavioral: Negative.     Objective   Vitals:   03/13/18 1546  BP: (!) 178/97  Pulse: (!) 58  Resp: 20  Temp: (!) 96.9 F (36.1 C)    Physical Exam  Constitutional: She is oriented to person, place, and time. She appears well-developed and well-nourished. No distress.  HENT:  Head: Normocephalic and atraumatic.  Cardiovascular: Normal rate, regular rhythm and normal heart sounds. Exam reveals no gallop and no friction rub.  No murmur heard. Pulmonary/Chest: Effort normal and breath sounds normal. No  stridor. No respiratory distress. She has no wheezes. She has no rales.  Abdominal: Soft. Bowel sounds are normal. She exhibits no distension and no mass. There is no tenderness. There is no guarding.  Neurological: She is alert and oriented to person, place, and time.  Skin: Skin is warm and dry.  Vitals reviewed. Breast: Left breast with well-healed surgical scar along the inferior outer portion of the left breast.  There is a 3 cm area of thickened scar tissue with some retraction present just below the nipple.  The axilla was negative for palpable nodes.  Right breast examination unremarkable.  Axilla is negative for palpable nodes.  Assessment   Left breast mass, history of invasive ductal carcinoma of left breast Plan   We will proceed with left breast biopsy.  I am concerned that this could be recurrent cancer given her history of not undergoing further therapy.  She does not want the breast removed.  The risks and benefits of the procedure including bleeding, infection, and recurrence of the cancer were fully explained to the patient, who gave informed consent.

## 2018-03-13 NOTE — Patient Instructions (Signed)
Breast Biopsy  A breast biopsy is a procedure in which a sample of suspicious breast tissue is removed from your breast. Following the procedure, the tissue or liquid that is removed from the breast is examined under a microscope to see if cancerous cells are present. You may need a breast biopsy if you have:  · Any undiagnosed breast mass (tumor).  · Nipple abnormalities, dimpling, crusting, or ulcerations.  · Abnormal discharge from the nipple, especially blood.  · Redness, swelling, and pain of the breast.  · Calcium deposits (calcifications) or abnormalities seen on a mammogram, ultrasound results, or MRI results.  · Suspicious changes in the breast seen on your mammogram.    If the breast abnormality is found to be cancerous (malignant), a breast biopsy can help to determine what the best treatment is for you. There are many different types of breast biopsies. Talk with your health care provider about your options and which type is best for you.  Tell a health care provider about:  · Any allergies you have.  · All medicines you are taking, including vitamins, herbs, eye drops, creams, and over-the-counter medicines.  · Any problems you or family members have had with anesthetic medicines.  · Any blood disorders you have.  · Any surgeries you have had.  · Any medical conditions you have.  · Whether you are pregnant or may be pregnant.  What are the risks?  Generally, this is a safe procedure. However, problems may occur, including:  · Bleeding.  · Infection.  · Discomfort. This is temporary.  · Allergic reactions to medicines.  · Bruising and swelling of the breast.  · Alteration in the shape of the breast.  · Damage to other tissues.  · Not finding the lump or abnormality.  · Needing more surgery.    What happens before the procedure?  · Plan to have someone take you home after the procedure.  · Do not use any tobacco products, such as cigarettes, chewing tobacco, and e-cigarettes. If you need help quitting,  ask your health care provider.  · Do not drink alcohol for 24 hours before the procedure.  · Ask your health care provider about:  ? Changing or stopping your regular medicines. This is especially important if you are taking diabetes medicines or blood thinners.  ? Taking medicines such as aspirin and ibuprofen. These medicines can thin your blood. Do not take these medicines before your procedure if your health care provider instructs you not to.  · Wear a good support bra to the procedure.  · Ask your health care provider how your surgical site will be marked or identified.  · You may be given antibiotic medicine to help prevent infection.  · Your health care provider may perform a procedure to place a wire (needle localization) or a seed that gives off radiation (radioactive seed localization) in the breast lump. A mammogram, ultrasound, MRI, or a combination of these techniques will be done during this procedure to identify the location of the breast abnormality. The imaging technique used will depend on the type of biopsy you are having. The wire or seed will help the health care provider locate the lump when performing the biopsy, especially if the lump cannot be felt.  What happens during the procedure?  You may be given one or both of the following:  · A medicine to numb the breast area (local anesthetic).  · A medicine to help you relax (sedative) during the procedure.      The following are the different types of biopsies that can be performed.  Fine-Needle Aspiration  A thin needle will be attached to a syringe and inserted into a breast cyst. Fluid and cells will be removed. This technique is not as common as a core needle biopsy.  Core Needle Biopsy  A wide, hollow needle (core needle) will be inserted into a breast lump multiple times to remove tissue samples or cores.  Stereotactic Biopsy  You will lie face-down on a table. Your breast will pass through an opening in the table and will be gently  compressed into a fixed position. X-ray equipment and a computer will be used to locate the breast lump. The surgeon will use this information to collect several samples of tissue using a needle collection device.  Vacuum-Assisted Biopsy  A small incision (less than ¼ inch) will be made in your breast. A biopsy device that includes a hollow needle and vacuum will be passed through the incision and into the breast tissue. The vacuum will gently draw abnormal breast tissue into the needle to remove it. No stitches (sutures) will be needed. The incision will be covered with a bandage (dressing). In this type of biopsy, a larger tissue sample is removed than in a regular core needle biopsy.  Ultrasound-Guided Core Needle Biopsy  A high-frequency ultrasound will be used to help guide the core needle to the area of the mass or abnormality. An incision will be made to insert the needle. Then tissue samples will be removed.  Surgical Biopsy  This method requires an incision in the breast to remove part or all of the suspicious tissue. After the tissue is removed, the skin over the area will be closed with sutures and covered with a dressing. There are two types of surgical biopsies:  · Incisional biopsy. The surgeon will remove part of the breast lump.  · Excisional biopsy. The surgeon will attempt to remove the whole breast lump or as much of it as possible.    After any of these procedures, the tissue or liquid that was removed will be examined under a microscope.  What happens after the procedure?  · You will be taken to the recovery area. If you are doing well and have no problems, you will be allowed to go home.  · You may notice bruising on your breast. This is normal.  · You may have a pressure dressing applied on your breast for 24-48 hours. A pressure dressing is a bandage that is wrapped tightly around the chest to stop fluid from collecting underneath tissues. You may also be advised to wear a supportive bra  during this time.  · Do not drive for 24 hours if you received a sedative.  This information is not intended to replace advice given to you by your health care provider. Make sure you discuss any questions you have with your health care provider.  Document Released: 08/15/2005 Document Revised: 12/24/2015 Document Reviewed: 05/19/2015  [employer]vier Interactive Patient Education © 2018 [employer]

## 2018-03-14 NOTE — H&P (Signed)
Carol Duarte; [id number]; [birth year]   HPI Patient is a 84 year old white female who was referred back to my care by Dr. Legrand Rams for evaluation treatment of a left breast mass.  She developed a tender area along the medial aspect of her previous left partial mastectomy scar.  She underwent a left partial mastectomy in 2018 for invasive ductal carcinoma.  She did not undergo chemotherapy or radiation therapy after the surgery.  She states she noticed the increased thickness and tenderness just below her left nipple.  She has not had a mammogram since the surgery as she says it hurts too much.  She currently has 0 out of 10 left breast pain.  She denies nipple discharge. Past Medical History:  Diagnosis Date  . Hypertension     Past Surgical History:  Procedure Laterality Date  . APPENDECTOMY    . brain cyst removed    . CATARACT EXTRACTION W/PHACO Left 11/09/2015   Procedure: CATARACT EXTRACTION PHACO AND INTRAOCULAR LENS PLACEMENT LEFT EYE cde=8.97;  Surgeon: [name], MD;  Location: [facility];  Service: Ophthalmology;  Laterality: Left;  . EYE SURGERY     KPE left  . MASTECTOMY, PARTIAL Left 04/24/2017   Procedure: MASTECTOMY PARTIAL;  Surgeon: [name], MD;  Location: [facility];  Service: General;  Laterality: Left;  . TONSILLECTOMY    . TONSILLECTOMY AND ADENOIDECTOMY      Family History  Problem Relation Age of Onset  . Heart failure Mother   . Heart failure Father     Current Outpatient Medications on File Prior to Visit  Medication Sig Dispense Refill  . acetaminophen (TYLENOL) 500 MG tablet Take 1,000 mg by mouth every 6 (six) hours as needed for mild pain or fever.     Marland Kitchen amLODipine (NORVASC) 5 MG tablet Take 5 mg by mouth daily.    Marland Kitchen aspirin EC 81 MG tablet Take 1 tablet (81 mg total) by mouth daily. 90 tablet 3  . ferrous sulfate 325 (65 FE) MG tablet Take 325 mg by mouth daily with breakfast.    . labetalol (NORMODYNE) 100 MG tablet Take 1 tablet (100 mg total) by mouth 2 (two)  times daily. 60 tablet 12  . oxybutynin (DITROPAN) 5 MG tablet Take 5 mg by mouth daily.     . simvastatin (ZOCOR) 40 MG tablet Take 40 mg by mouth daily.     No current facility-administered medications on file prior to visit.     No Known Allergies  Social History   Substance and Sexual Activity  Alcohol Use No    Social History   Tobacco Use  Smoking Status Never Smoker  Smokeless Tobacco Former Systems developer    Review of Systems  Constitutional: Negative.   HENT: Negative.   Eyes: Negative.   Respiratory: Negative.   Cardiovascular: Negative.   Gastrointestinal: Negative.   Genitourinary: Negative.   Musculoskeletal: Negative.   Skin: Negative.   Neurological: Negative.   Endo/Heme/Allergies: Negative.   Psychiatric/Behavioral: Negative.     Objective   Vitals:   03/13/18 1546  BP: (!) 178/97  Pulse: (!) 58  Resp: 20  Temp: (!) 96.9 F (36.1 C)    Physical Exam  Constitutional: She is oriented to person, place, and time. She appears well-developed and well-nourished. No distress.  HENT:  Head: Normocephalic and atraumatic.  Cardiovascular: Normal rate, regular rhythm and normal heart sounds. Exam reveals no gallop and no friction rub.  No murmur heard. Pulmonary/Chest: Effort normal and breath sounds normal. No  stridor. No respiratory distress. She has no wheezes. She has no rales.  Abdominal: Soft. Bowel sounds are normal. She exhibits no distension and no mass. There is no tenderness. There is no guarding.  Neurological: She is alert and oriented to person, place, and time.  Skin: Skin is warm and dry.  Vitals reviewed. Breast: Left breast with well-healed surgical scar along the inferior outer portion of the left breast.  There is a 3 cm area of thickened scar tissue with some retraction present just below the nipple.  The axilla was negative for palpable nodes.  Right breast examination unremarkable.  Axilla is negative for palpable nodes.  Assessment   Left breast mass, history of invasive ductal carcinoma of left breast Plan   We will proceed with left breast biopsy.  I am concerned that this could be recurrent cancer given her history of not undergoing further therapy.  She does not want the breast removed.  The risks and benefits of the procedure including bleeding, infection, and recurrence of the cancer were fully explained to the patient, who gave informed consent.

## 2018-03-21 NOTE — Patient Instructions (Addendum)
Carol Duarte  03/21/2018     @PREFPERIOPPHARMACY @   Your procedure is scheduled on  03/28/2018 .  Report to [facility] at  930   A.M.  Call this number if you have problems the morning of surgery:  [phone]   Remember:  Do not eat or drink after midnight.  You may drink clear liquids until  12 midnight 03/27/2018  .  Clear liquids allowed are:                    Water, Juice (non-citric and without pulp), Carbonated beverages, Clear Tea, Black Coffee only, Plain Jell-O only, Gatorade and Plain Popsicles only    Take these medicines the morning of surgery with A SIP OF WATER   amlodipine, labetolol, ditropan.    Do not wear jewelry, make-up or nail polish.  Do not wear lotions, powders, or perfumes, or deodorant.  Do not shave 48 hours prior to surgery.  Men may shave face and neck.  Do not bring valuables to the hospital.  [facility] is not responsible for any belongings or valuables.  Contacts, dentures or bridgework may not be worn into surgery.  Leave your suitcase in the car.  After surgery it may be brought to your room.  For patients admitted to the hospital, discharge time will be determined by your treatment team.  Patients discharged the day of surgery will not be allowed to drive home.   Name and phone number of your driver:   family Special instructions:  None  Please read over the following fact sheets that you were given. Anesthesia Post-op Instructions and Care and Recovery After Surgery       Breast Biopsy A breast biopsy is a test during which a sample of tissue is taken from your breast. The breast tissue is looked at under a microscope for cancer cells. What happens before the procedure?  Plan to have someone take you home after the test.  Do not use tobacco products. These include cigarettes, chewing tobacco, or e-cigarettes. If you need help quitting, ask your doctor.  Do not drink alcohol for 24 hours before the test.  Ask  your doctor about: ? Changing or stopping your normal medicines. This is important if you take diabetes medicines or blood thinners. ? Taking medicines such as aspirin and ibuprofen. These medicines can thin your blood. Do not take these medicines before your procedure if your doctor tells you not to.  Wear a good support bra to the test.  Ask your doctor how your surgical site will be marked or identified.  You may be given antibiotic medicine to help prevent infection.  You may be checked for extra fluid in your body (lymphedema).  Your doctor may place a wire or a seed in the lump. The wire or seed gives off radiation. This will help your doctor to see the lump during the biopsy. What happens during the procedure? You may be given the following:  A medicine to numb the breast area (local anesthetic).  A medicine to help you relax (sedative).  There are different types of breast biopsies. Each type is described below. Fine-Needle Aspiration  A needle will be put into the breast lump.  The needle will take out fluid and cells from the lump. Core-Needle Biopsy  A needle will be put into the breast lump.  The needle will be put into your breast several times.  The needle will remove breast tissue. Stereotactic Biopsy  You will lie on a table on your belly. Your breast will pass through a hole in the table. Your breast will be held in place.  X-rays and a computer will be used to locate the breast lump.  A needle will be used to remove tissue samples from your breast. Vacuum-Assisted Biopsy  A small cut (incision) will be made in your breast.  A biopsy device will be put through the cut and into the breast tissue.  The biopsy device will draw abnormal breast tissue into the biopsy device.  A large tissue sample will often be removed.  No stitches will be needed. Ultrasound-Guided Core-Needle Biopsy  Ultrasound imaging will help guide the needle into the area of the  breast that is not normal.  A cut will be made in the breast. The needle will be put into the breast lump.  Tissue samples will be taken out. Surgical Biopsy  A cut will be made in the breast to remove tissue.  The cut will be closed with stitches and covered with a bandage.  There are two types: ? Incisional biopsy. Your doctor will remove part of the breast lump. ? Excisional biopsy. Your doctor will try to remove the whole breast lump or as much as possible. All tissue or fluid samples will be looked at under a microscope. What happens after the procedure?  You will be able to go home when you are doing well and you are not having problems.  You may have bruising on your breast. This is normal.  A pressure bandage (dressing) may be put on your breast. It may be left on for 24-48 hours. This type of bandage is wrapped tightly around your chest. It helps to stop fluid from building up under tissues. You may also need to wear a supportive bra during this time.  Do not drive for 24 hours if you received a sedative. This information is not intended to replace advice given to you by your health care provider. Make sure you discuss any questions you have with your health care provider. Document Released: 11/07/2011 Document Revised: 04/21/2016 Document Reviewed: 05/19/2015 Elsevier Interactive Patient Education  2018 [employer].  Breast Biopsy, Care After These instructions give you information about caring for yourself after your procedure. Your doctor may also give you more specific instructions. Call your doctor if you have any problems or questions after your procedure. Follow these instructions at home: Medicines  Take over-the-counter and prescription medicines only as told by your doctor.  Do not drive for 24 hours if you received a sedative.  Do not drink alcohol while taking pain medicine.  Do not drive or use heavy machinery while taking prescription pain  medicine. Biopsy Site Care   Follow instructions from your doctor about how to take care of your cut from surgery (incision) or puncture area. Make sure you: ? Wash your hands with soap and water before you change your bandage. If you cannot use soap and water, use hand sanitizer. ? Change any bandages (dressings) as told by your doctor. ? Leave any stitches (sutures), skin glue, or skin tape (adhesive) strips in place. They may need to stay in place for 2 weeks or longer. If tape strips get loose and curl up, you may trim the loose edges. Do not remove tape strips completely unless your doctor says it is okay.  If you have stitches, keep them dry when you take a bath  or a shower.  Check your cut or puncture area every day for signs of infection. Check for: ? More redness, swelling, or pain. ? More fluid or blood. ? Warmth. ? Pus or a bad smell.  Protect the biopsy area. Do not let the area get bumped. Activity  Avoid activities that could pull the biopsy site open. ? Avoid stretching. ? Avoid reaching. ? Avoid exercise. ? Avoid sports. ? Avoid lifting anything that is heavier than 3 pounds (1.4 kg).  Return to your normal activities as told by your doctor. Ask your doctor what activities are safe for you. General instructions  Continue your normal diet.  Wear a good support bra for as long as told by your doctor.  Get checked for extra fluid in your body (lymphedema) as often as told by your doctor.  Keep all follow-up visits as told by your doctor. This is important. Contact a health care provider if:  You have more redness, swelling, or pain at the biopsy site.  You have more fluid or blood coming from your biopsy site.  Your biopsy site feels warm to the touch.  You have pus or a bad smell coming from the biopsy site.  Your biopsy site breaks open after the stitches, staples, or skin tape strips have been removed.  You have a rash.  You have a fever. Get help  right away if:  You have more bleeding (more than a small spot) from the biopsy site.  You have trouble breathing.  You have red streaks around the biopsy site. This information is not intended to replace advice given to you by your health care provider. Make sure you discuss any questions you have with your health care provider. Document Released: 06/11/2009 Document Revised: 04/21/2016 Document Reviewed: 05/19/2015 Elsevier Interactive Patient Education  2018 [place] Anesthesia, Adult General anesthesia is the use of medicines to make a person "go to sleep" (be unconscious) for a medical procedure. General anesthesia is often recommended when a procedure:  Is long.  Requires you to be still or in an unusual position.  Is major and can cause you to lose blood.  Is impossible to do without general anesthesia.  The medicines used for general anesthesia are called general anesthetics. In addition to making you sleep, the medicines:  Prevent pain.  Control your blood pressure.  Relax your muscles.  Tell a health care provider about:  Any allergies you have.  All medicines you are taking, including vitamins, herbs, eye drops, creams, and over-the-counter medicines.  Any problems you or family members have had with anesthetic medicines.  Types of anesthetics you have had in the past.  Any bleeding disorders you have.  Any surgeries you have had.  Any medical conditions you have.  Any history of heart or lung conditions, such as heart failure, sleep apnea, or chronic obstructive pulmonary disease (COPD).  Whether you are pregnant or may be pregnant.  Whether you use tobacco, alcohol, marijuana, or street drugs.  Any history of Armed forces logistics/support/administrative officer.  Any history of depression or anxiety. What are the risks? Generally, this is a safe procedure. However, problems may occur, including:  Allergic reaction to anesthetics.  Lung and heart  problems.  Inhaling food or liquids from your stomach into your lungs (aspiration).  Injury to nerves.  Waking up during your procedure and being unable to move (rare).  Extreme agitation or a state of mental confusion (delirium) when you wake up from the anesthetic.  Air in the bloodstream, which can lead to stroke.  These problems are more likely to develop if you are having a major surgery or if you have an advanced medical condition. You can prevent some of these complications by answering all of your health care provider's questions thoroughly and by following all pre-procedure instructions. General anesthesia can cause side effects, including:  Nausea or vomiting  A sore throat from the breathing tube.  Feeling cold or shivery.  Feeling tired, washed out, or achy.  Sleepiness or drowsiness.  Confusion or agitation.  What happens before the procedure? Staying hydrated Follow instructions from your health care provider about hydration, which may include:  Up to 2 hours before the procedure - you may continue to drink clear liquids, such as water, clear fruit juice, black coffee, and plain tea.  Eating and drinking restrictions Follow instructions from your health care provider about eating and drinking, which may include:  8 hours before the procedure - stop eating heavy meals or foods such as meat, fried foods, or fatty foods.  6 hours before the procedure - stop eating light meals or foods, such as toast or cereal.  6 hours before the procedure - stop drinking milk or drinks that contain milk.  2 hours before the procedure - stop drinking clear liquids.  Medicines  Ask your health care provider about: ? Changing or stopping your regular medicines. This is especially important if you are taking diabetes medicines or blood thinners. ? Taking medicines such as aspirin and ibuprofen. These medicines can thin your blood. Do not take these medicines before your  procedure if your health care provider instructs you not to. ? Taking new dietary supplements or medicines. Do not take these during the week before your procedure unless your health care provider approves them.  If you are told to take a medicine or to continue taking a medicine on the day of the procedure, take the medicine with sips of water. General instructions   Ask if you will be going home the same day, the following day, or after a longer hospital stay. ? Plan to have someone take you home. ? Plan to have someone stay with you for the first 24 hours after you leave the hospital or clinic.  For 3-6 weeks before the procedure, try not to use any tobacco products, such as cigarettes, chewing tobacco, and e-cigarettes.  You may brush your teeth on the morning of the procedure, but make sure to spit out the toothpaste. What happens during the procedure?  You will be given anesthetics through a mask and through an IV tube in one of your veins.  You may receive medicine to help you relax (sedative).  As soon as you are asleep, a breathing tube may be used to help you breathe.  An anesthesia specialist will stay with you throughout the procedure. He or she will help keep you comfortable and safe by continuing to give you medicines and adjusting the amount of medicine that you get. He or she will also watch your blood pressure, pulse, and oxygen levels to make sure that the anesthetics do not cause any problems.  If a breathing tube was used to help you breathe, it will be removed before you wake up. The procedure may vary among health care providers and hospitals. What happens after the procedure?  You will wake up, often slowly, after the procedure is complete, usually in a recovery area.  Your blood pressure, heart rate, breathing rate,  and blood oxygen level will be monitored until the medicines you were given have worn off.  You may be given medicine to help you calm down if you  feel anxious or agitated.  If you will be going home the same day, your health care provider may check to make sure you can stand, drink, and urinate.  Your health care providers will treat your pain and side effects before you go home.  Do not drive for 24 hours if you received a sedative.  You may: ? Feel nauseous and vomit. ? Have a sore throat. ? Have mental slowness. ? Feel cold or shivery. ? Feel sleepy. ? Feel tired. ? Feel sore or achy, even in parts of your body where you did not have surgery. This information is not intended to replace advice given to you by your health care provider. Make sure you discuss any questions you have with your health care provider. Document Released: 11/22/2007 Document Revised: 01/26/2016 Document Reviewed: 07/30/2015 Elsevier Interactive Patient Education  2018 [place] Anesthesia, Adult, Care After These instructions provide you with information about caring for yourself after your procedure. Your health care provider may also give you more specific instructions. Your treatment has been planned according to current medical practices, but problems sometimes occur. Call your health care provider if you have any problems or questions after your procedure. What can I expect after the procedure? After the procedure, it is common to have:  Vomiting.  A sore throat.  Mental slowness.  It is common to feel:  Nauseous.  Cold or shivery.  Sleepy.  Tired.  Sore or achy, even in parts of your body where you did not have surgery.  Follow these instructions at home: For at least 24 hours after the procedure:  Do not: ? Participate in activities where you could fall or become injured. ? Drive. ? Use heavy machinery. ? Drink alcohol. ? Take sleeping pills or medicines that cause drowsiness. ? Make important decisions or sign legal documents. ? Take care of children on your own.  Rest. Eating and drinking  If you vomit,  drink water, juice, or soup when you can drink without vomiting.  Drink enough fluid to keep your urine clear or pale yellow.  Make sure you have little or no nausea before eating solid foods.  Follow the diet recommended by your health care provider. General instructions  Have a responsible adult stay with you until you are awake and alert.  Return to your normal activities as told by your health care provider. Ask your health care provider what activities are safe for you.  Take over-the-counter and prescription medicines only as told by your health care provider.  If you smoke, do not smoke without supervision.  Keep all follow-up visits as told by your health care provider. This is important. Contact a health care provider if:  You continue to have nausea or vomiting at home, and medicines are not helpful.  You cannot drink fluids or start eating again.  You cannot urinate after 8-12 hours.  You develop a skin rash.  You have fever.  You have increasing redness at the site of your procedure. Get help right away if:  You have difficulty breathing.  You have chest pain.  You have unexpected bleeding.  You feel that you are having a life-threatening or urgent problem. This information is not intended to replace advice given to you by your health care provider. Make sure you discuss any questions  you have with your health care provider. Document Released: 11/21/2000 Document Revised: 01/18/2016 Document Reviewed: 07/30/2015 Elsevier Interactive Patient Education  [employer].

## 2018-03-23 ENCOUNTER — Encounter (HOSPITAL_COMMUNITY)
Admission: RE | Admit: 2018-03-23 | Discharge: 2018-03-23 | Disposition: A | Discharge status: Home / Self Care | Payer: Medicare Other | Source: Ambulatory Visit | Attending: General Surgery | Admitting: General Surgery

## 2018-03-23 ENCOUNTER — Encounter (HOSPITAL_COMMUNITY): Payer: Self-pay

## 2018-03-23 ENCOUNTER — Other Ambulatory Visit: Payer: Self-pay

## 2018-03-23 DIAGNOSIS — R001 Bradycardia, unspecified: Secondary | ICD-10-CM | POA: Insufficient documentation

## 2018-03-23 DIAGNOSIS — Z01810 Encounter for preprocedural cardiovascular examination: Secondary | ICD-10-CM | POA: Diagnosis not present

## 2018-03-23 DIAGNOSIS — Z01812 Encounter for preprocedural laboratory examination: Secondary | ICD-10-CM | POA: Diagnosis not present

## 2018-03-23 HISTORY — DX: Malignant (primary) neoplasm, unspecified: C80.1

## 2018-03-23 HISTORY — DX: Personal history of urinary calculi: Z87.442

## 2018-03-23 LAB — CBC WITH DIFFERENTIAL/PLATELET
Basophils Absolute: 0 10*3/uL (ref 0.0–0.1)
Basophils Relative: 1 %
Eosinophils Absolute: 0.3 10*3/uL (ref 0.0–0.7)
Eosinophils Relative: 7 %
HCT: 37 % (ref 36.0–46.0)
HEMOGLOBIN: 11.7 g/dL — AB (ref 12.0–15.0)
LYMPHS ABS: 1.1 10*3/uL (ref 0.7–4.0)
Lymphocytes Relative: 26 %
MCH: 30.6 pg (ref 26.0–34.0)
MCHC: 31.6 g/dL (ref 30.0–36.0)
MCV: 96.9 fL (ref 78.0–100.0)
MONO ABS: 0.4 10*3/uL (ref 0.1–1.0)
MONOS PCT: 10 %
NEUTROS PCT: 56 %
Neutro Abs: 2.3 10*3/uL (ref 1.7–7.7)
Platelets: 185 10*3/uL (ref 150–400)
RBC: 3.82 MIL/uL — ABNORMAL LOW (ref 3.87–5.11)
RDW: 13.5 % (ref 11.5–15.5)
WBC: 4.1 10*3/uL (ref 4.0–10.5)

## 2018-03-23 LAB — SURGICAL PCR SCREEN
MRSA, PCR: NEGATIVE
STAPHYLOCOCCUS AUREUS: NEGATIVE

## 2018-03-23 LAB — COMPREHENSIVE METABOLIC PANEL
ALBUMIN: 3.9 g/dL (ref 3.5–5.0)
ALK PHOS: 46 U/L (ref 38–126)
ALT: 17 U/L (ref 0–44)
AST: 15 U/L (ref 15–41)
Anion gap: 7 (ref 5–15)
BUN: 27 mg/dL — AB (ref 8–23)
CALCIUM: 9.2 mg/dL (ref 8.9–10.3)
CHLORIDE: 111 mmol/L (ref 98–111)
CO2: 24 mmol/L (ref 22–32)
Creatinine, Ser: 1.23 mg/dL — ABNORMAL HIGH (ref 0.44–1.00)
GFR calc non Af Amer: 37 mL/min — ABNORMAL LOW (ref >60)
GFR, EST AFRICAN AMERICAN: 43 mL/min — AB (ref >60)
GLUCOSE: 149 mg/dL — AB (ref 70–99)
Potassium: 4.5 mmol/L (ref 3.5–5.1)
Sodium: 142 mmol/L (ref 135–145)
Total Bilirubin: 0.7 mg/dL (ref 0.3–1.2)
Total Protein: 6.1 g/dL — ABNORMAL LOW (ref 6.5–8.1)

## 2018-03-28 ENCOUNTER — Encounter (HOSPITAL_COMMUNITY)
Admission: RE | Disposition: A | Discharge status: Home / Self Care | Payer: Self-pay | Source: Ambulatory Visit | Attending: General Surgery

## 2018-03-28 ENCOUNTER — Encounter (HOSPITAL_COMMUNITY): Payer: Self-pay

## 2018-03-28 ENCOUNTER — Other Ambulatory Visit: Payer: Self-pay

## 2018-03-28 ENCOUNTER — Ambulatory Visit (HOSPITAL_COMMUNITY): Payer: Medicare Other | Admitting: Anesthesiology

## 2018-03-28 ENCOUNTER — Ambulatory Visit (HOSPITAL_COMMUNITY)
Admission: RE | Admit: 2018-03-28 | Discharge: 2018-03-28 | Disposition: A | Discharge status: Home / Self Care | Payer: Medicare Other | Source: Ambulatory Visit | Attending: General Surgery | Admitting: General Surgery

## 2018-03-28 DIAGNOSIS — I10 Essential (primary) hypertension: Secondary | ICD-10-CM | POA: Insufficient documentation

## 2018-03-28 DIAGNOSIS — Z853 Personal history of malignant neoplasm of breast: Secondary | ICD-10-CM | POA: Diagnosis not present

## 2018-03-28 DIAGNOSIS — N644 Mastodynia: Secondary | ICD-10-CM | POA: Diagnosis not present

## 2018-03-28 DIAGNOSIS — N6320 Unspecified lump in the left breast, unspecified quadrant: Secondary | ICD-10-CM | POA: Diagnosis not present

## 2018-03-28 DIAGNOSIS — Z8249 Family history of ischemic heart disease and other diseases of the circulatory system: Secondary | ICD-10-CM | POA: Insufficient documentation

## 2018-03-28 DIAGNOSIS — Z9842 Cataract extraction status, left eye: Secondary | ICD-10-CM | POA: Insufficient documentation

## 2018-03-28 DIAGNOSIS — Z7982 Long term (current) use of aspirin: Secondary | ICD-10-CM | POA: Insufficient documentation

## 2018-03-28 DIAGNOSIS — Z87891 Personal history of nicotine dependence: Secondary | ICD-10-CM | POA: Insufficient documentation

## 2018-03-28 DIAGNOSIS — C50512 Malignant neoplasm of lower-outer quadrant of left female breast: Secondary | ICD-10-CM

## 2018-03-28 DIAGNOSIS — Z79899 Other long term (current) drug therapy: Secondary | ICD-10-CM | POA: Insufficient documentation

## 2018-03-28 DIAGNOSIS — C50912 Malignant neoplasm of unspecified site of left female breast: Secondary | ICD-10-CM | POA: Diagnosis not present

## 2018-03-28 DIAGNOSIS — C50012 Malignant neoplasm of nipple and areola, left female breast: Secondary | ICD-10-CM | POA: Diagnosis not present

## 2018-03-28 HISTORY — PX: BREAST BIOPSY: SHX20

## 2018-03-28 SURGERY — BREAST BIOPSY
Anesthesia: General | Site: Breast | Laterality: Left

## 2018-03-28 MED ORDER — MEPERIDINE HCL 50 MG/ML IJ SOLN: 6.2500 mg | INTRAMUSCULAR | Status: DC | PRN

## 2018-03-28 MED ORDER — FENTANYL CITRATE (PF) 100 MCG/2ML IJ SOLN
INTRAMUSCULAR | Status: AC
  Filled 2018-03-28: qty 2

## 2018-03-28 MED ORDER — EPHEDRINE SULFATE 50 MG/ML IJ SOLN
INTRAMUSCULAR | Status: AC
  Filled 2018-03-28: qty 2

## 2018-03-28 MED ORDER — ONDANSETRON HCL 4 MG/2ML IJ SOLN
INTRAMUSCULAR | Status: AC
  Filled 2018-03-28: qty 2

## 2018-03-28 MED ORDER — LIDOCAINE HCL (PF) 1 % IJ SOLN
INTRAMUSCULAR | Status: DC | PRN
  Administered 2018-03-28: 10 mL

## 2018-03-28 MED ORDER — GLYCOPYRROLATE 0.2 MG/ML IJ SOLN
INTRAMUSCULAR | Status: AC
  Filled 2018-03-28: qty 2

## 2018-03-28 MED ORDER — HYDROMORPHONE HCL 1 MG/ML IJ SOLN: 0.2500 mg | INTRAMUSCULAR | Status: DC | PRN

## 2018-03-28 MED ORDER — MIDAZOLAM HCL 5 MG/5ML IJ SOLN
INTRAMUSCULAR | Status: DC | PRN
  Administered 2018-03-28: 2 mg via INTRAVENOUS

## 2018-03-28 MED ORDER — LIDOCAINE HCL (PF) 1 % IJ SOLN
INTRAMUSCULAR | Status: AC
  Filled 2018-03-28: qty 5

## 2018-03-28 MED ORDER — PROMETHAZINE HCL 25 MG/ML IJ SOLN: 6.2500 mg | INTRAMUSCULAR | Status: DC | PRN

## 2018-03-28 MED ORDER — PROPOFOL 10 MG/ML IV BOLUS
INTRAVENOUS | Status: DC | PRN
  Administered 2018-03-28: 120 mg via INTRAVENOUS

## 2018-03-28 MED ORDER — KETOROLAC TROMETHAMINE 30 MG/ML IJ SOLN
15.0000 mg | Freq: Once | INTRAMUSCULAR | Status: DC
  Filled 2018-03-28: qty 1

## 2018-03-28 MED ORDER — LIDOCAINE HCL (PF) 1 % IJ SOLN
INTRAMUSCULAR | Status: AC
  Filled 2018-03-28: qty 30

## 2018-03-28 MED ORDER — TRAMADOL HCL 50 MG PO TABS: 50.0000 mg | ORAL_TABLET | Freq: Two times a day (BID) | ORAL | 0 refills | Status: DC | PRN

## 2018-03-28 MED ORDER — FENTANYL CITRATE (PF) 100 MCG/2ML IJ SOLN
INTRAMUSCULAR | Status: DC | PRN
  Administered 2018-03-28: 25 ug via INTRAVENOUS

## 2018-03-28 MED ORDER — LACTATED RINGERS IV SOLN
INTRAVENOUS | Status: DC
  Administered 2018-03-28: 10:00:00 via INTRAVENOUS

## 2018-03-28 MED ORDER — PROPOFOL 10 MG/ML IV BOLUS
INTRAVENOUS | Status: AC
  Filled 2018-03-28: qty 20

## 2018-03-28 MED ORDER — HYDROCODONE-ACETAMINOPHEN 7.5-325 MG PO TABS: 1.0000 | ORAL_TABLET | Freq: Once | ORAL | Status: DC | PRN

## 2018-03-28 MED ORDER — GLYCOPYRROLATE 0.2 MG/ML IJ SOLN
INTRAMUSCULAR | Status: DC | PRN
  Administered 2018-03-28: 0.4 mg via INTRAVENOUS

## 2018-03-28 MED ORDER — CHLORHEXIDINE GLUCONATE CLOTH 2 % EX PADS: 6.0000 | MEDICATED_PAD | Freq: Once | CUTANEOUS | Status: DC

## 2018-03-28 MED ORDER — SODIUM CHLORIDE 0.9 % IJ SOLN
INTRAMUSCULAR | Status: AC
  Filled 2018-03-28: qty 30

## 2018-03-28 MED ORDER — LACTATED RINGERS IV SOLN: INTRAVENOUS | Status: DC

## 2018-03-28 MED ORDER — BUPIVACAINE HCL (PF) 0.5 % IJ SOLN
INTRAMUSCULAR | Status: AC
  Filled 2018-03-28: qty 30

## 2018-03-28 MED ORDER — SODIUM CHLORIDE 0.9 % IR SOLN
Status: DC | PRN
  Administered 2018-03-28: 1000 mL

## 2018-03-28 MED ORDER — MIDAZOLAM HCL 2 MG/2ML IJ SOLN
INTRAMUSCULAR | Status: AC
  Filled 2018-03-28: qty 2

## 2018-03-28 SURGICAL SUPPLY — 30 items
ADH SKN CLS APL DERMABOND .7 (GAUZE/BANDAGES/DRESSINGS) ×1
BLADE SURG 15 STRL LF DISP TIS (BLADE) ×1 IMPLANT
BLADE SURG 15 STRL SS (BLADE) ×3
CHLORAPREP W/TINT 26ML (MISCELLANEOUS) ×3 IMPLANT
CLOTH BEACON ORANGE TIMEOUT ST (SAFETY) ×3 IMPLANT
COVER LIGHT HANDLE STERIS (MISCELLANEOUS) ×6 IMPLANT
DECANTER SPIKE VIAL GLASS SM (MISCELLANEOUS) ×3 IMPLANT
DERMABOND ADVANCED (GAUZE/BANDAGES/DRESSINGS) ×2
DERMABOND ADVANCED .7 DNX12 (GAUZE/BANDAGES/DRESSINGS) ×1 IMPLANT
ELECT REM PT RETURN 9FT ADLT (ELECTROSURGICAL) ×3
ELECTRODE REM PT RTRN 9FT ADLT (ELECTROSURGICAL) ×1 IMPLANT
GLOVE BIOGEL PI IND STRL 6.5 (GLOVE) IMPLANT
GLOVE BIOGEL PI IND STRL 7.0 (GLOVE) ×2 IMPLANT
GLOVE BIOGEL PI INDICATOR 6.5 (GLOVE) ×2
GLOVE BIOGEL PI INDICATOR 7.0 (GLOVE) ×4
GLOVE SS N UNI LF 6.5 STRL (GLOVE) ×2 IMPLANT
GLOVE SURG SS PI 7.5 STRL IVOR (GLOVE) ×3 IMPLANT
GOWN STRL REUS W/TWL LRG LVL3 (GOWN DISPOSABLE) ×6 IMPLANT
KIT TURNOVER KIT A (KITS) ×3 IMPLANT
MANIFOLD NEPTUNE II (INSTRUMENTS) ×3 IMPLANT
NDL HYPO 25X1 1.5 SAFETY (NEEDLE) ×1 IMPLANT
NEEDLE HYPO 25X1 1.5 SAFETY (NEEDLE) ×3 IMPLANT
NS IRRIG 1000ML POUR BTL (IV SOLUTION) ×3 IMPLANT
PACK MINOR (CUSTOM PROCEDURE TRAY) ×3 IMPLANT
PAD ARMBOARD 7.5X6 YLW CONV (MISCELLANEOUS) ×3 IMPLANT
SET BASIN LINEN APH (SET/KITS/TRAYS/PACK) ×3 IMPLANT
SUT MNCRL AB 4-0 PS2 18 (SUTURE) ×3 IMPLANT
SUT VIC AB 3-0 SH 27 (SUTURE)
SUT VIC AB 3-0 SH 27X BRD (SUTURE) IMPLANT
SYR CONTROL 10ML LL (SYRINGE) ×3 IMPLANT

## 2018-03-28 NOTE — Interval H&P Note (Signed)
History and Physical Interval Note:  03/28/2018 9:54 AM  Carol Duarte [employer]  has presented today for surgery, with the diagnosis of left breast mass  The various methods of treatment have been discussed with the patient and family. After consideration of risks, benefits and other options for treatment, the patient has consented to  Procedure(s): BREAST BIOPSY (Left) as a surgical intervention .  The patient's history has been reviewed, patient examined, no change in status, stable for surgery.  I have reviewed the patient's chart and labs.  Questions were answered to the patient's satisfaction.     [name]

## 2018-03-28 NOTE — Transfer of Care (Signed)
Immediate Anesthesia Transfer of Care Note  Patient: Herbert Pun Apple Burget  Procedure(s) Performed: BREAST BIOPSY (Left Breast)  Patient Location: PACU  Anesthesia Type:General  Level of Consciousness: awake and patient cooperative  Airway & Oxygen Therapy: Patient Spontanous Breathing and Patient connected to nasal cannula oxygen  Post-op Assessment: Report given to RN, Post -op Vital signs reviewed and stable and Patient moving all extremities  Post vital signs: Reviewed and stable  Last Vitals:  Vitals Value Taken Time  BP    Temp    Pulse    Resp    SpO2      Last Pain:  Vitals:   03/28/18 0946  TempSrc: Oral  PainSc: 0-No pain      Patients Stated Pain Goal: 7 (81/84/03 7543)  Complications: No apparent anesthesia complications

## 2018-03-28 NOTE — Anesthesia Procedure Notes (Signed)
Procedure Name: LMA Insertion Date/Time: 03/28/2018 11:15 AM Performed by: Charmaine Downs, CRNA Pre-anesthesia Checklist: Patient identified, Patient being monitored, Emergency Drugs available, Timeout performed and Suction available Patient Re-evaluated:Patient Re-evaluated prior to induction Oxygen Delivery Method: Circle System Utilized Preoxygenation: Pre-oxygenation with 100% oxygen Induction Type: IV induction Ventilation: Mask ventilation without difficulty LMA: LMA inserted LMA Size: 4.0 Number of attempts: 1 Placement Confirmation: positive ETCO2 and breath sounds checked- equal and bilateral Tube secured with: Tape Dental Injury: Teeth and Oropharynx as per pre-operative assessment

## 2018-03-28 NOTE — Anesthesia Postprocedure Evaluation (Signed)
Anesthesia Post Note  Patient: Carol Duarte  Procedure(s) Performed: BREAST BIOPSY (Left Breast)  Patient location during evaluation: PACU Anesthesia Type: General Level of consciousness: awake and alert and oriented Pain management: pain level controlled Vital Signs Assessment: post-procedure vital signs reviewed and stable Respiratory status: spontaneous breathing Cardiovascular status: blood pressure returned to baseline and stable Postop Assessment: no apparent nausea or vomiting Anesthetic complications: no Comments: Late entry     Last Vitals:  Vitals:   03/28/18 1232 03/28/18 1235  BP:  (!) 172/67  Pulse: (!) 54   Resp:    Temp:    SpO2: 97%     Last Pain:  Vitals:   03/28/18 1232  TempSrc:   PainSc: 0-No pain                 Dawsyn Zurn

## 2018-03-28 NOTE — Op Note (Signed)
Patient:  Carol Duarte  DOB:  06-Nov-1924  MRN:  [id number]   Preop Diagnosis: Left breast mass, history of left breast carcinoma  Postop Diagnosis: Same  Procedure: Left breast biopsy  Surgeon: [name], MD  Anes: General  Indications: Patient is a 84 year old white female status post left partial mastectomy for breast cancer who now presents with a recurrent mass along the medial aspect of her previous mastectomy site and just inferior to the left nipple.  She only wanted limited treatment for her left breast cancer in the past.  Is now undergoing a left breast biopsy to check for recurrence.  The risks and benefits of the procedure including bleeding, infection, and the possibility of recurrence of the malignancy were fully explained to the patient, who gave informed consent.  Procedure note: Patient was placed in supine position.  After general anesthesia was administered, the left breast was prepped and draped using usual sterile technique with DuraPrep.  Surgical site confirmation was performed.  Elliptical incision to include skin over the mass was performed just inferior to the left nipple.  This was along the outer medial aspect of the previous mastectomy surgical site.  Dissection was taken down to what appeared to be normal appearing tissue.  This was removed and sent to pathology for examination.  Any bleeding was controlled using Bovie electrocautery.  The subcutaneous layer was reapproximated using a 3-0 Vicryl interrupted suture.  1% Xylocaine was used instilled into the surrounding wound.  The skin was closed using a 4-0 Monocryl subcuticular suture.  Dermabond was applied.  All tape and needle counts were correct at the end of the procedure.  Patient was awakened and transferred to PACU in stable condition.  Complications: None  EBL: Minimal  Specimen: Left breast tissue

## 2018-03-28 NOTE — Discharge Instructions (Signed)
Breast Biopsy, Care After Refer to this sheet in the next few weeks. These instructions provide you with information about caring for yourself after your procedure. Your health care provider may also give you more specific instructions. Your treatment has been planned according to current medical practices, but problems sometimes occur. Call your health care provider if you have any problems or questions after your procedure. What can I expect after the procedure? After your procedure, it is common to have:  Bruising on your breast.  Numbness, tingling, or pain near your biopsy site.  Follow these instructions at home: Medicines  Take over-the-counter and prescription medicines only as told by your health care provider.  Do not drive for 24 hours if you received a sedative.  Do not drink alcohol while taking pain medicine.  Do not drive or operate heavy machinery while taking prescription pain medicine. Biopsy Site Care   Follow instructions from your health care provider about how to take care of your incision or puncture site. Make sure you: ? Wash your hands with soap and water before you change your dressing. If soap and water are not available, use hand sanitizer. ? Change any bandages (dressings) as told by your health care provider. ? Leave any stitches (sutures), skin glue, or adhesive strips in place. These skin closures may need to stay in place for 2 weeks or longer. If adhesive strip edges start to loosen and curl up, you may trim the loose edges. Do not remove adhesive strips completely unless your health care provider tells you to do that.  If you have sutures, keep them dry when bathing.  Check your incision or puncture area every day for signs of infection. Check for: ? More redness, swelling, or pain. ? More fluid or blood. ? Warmth. ? Pus or a bad smell.  Protect the biopsy area. Do not let the area get bumped. Activity  If you had an incision during your  procedure,avoid activities that may pull the incision site open. Avoid stretching, reaching, exercise, sports, or lifting anything that is heavier than 3 lb (1.4 kg).  Return to your normal activities as told by your health care provider. Ask your health care provider what activities are safe for you. General instructions  Resume your usual diet.  Wear a good support bra for as long as told by your health care provider.  Get checked for extra fluid around your lymph nodes (lymphedema) as often as told by your health care provider.  Keep all follow-up visits as told by your health care provider. This is important. Contact a health care provider if:  You have more redness, swelling, or pain at the biopsy site.  You have more fluid or blood coming from your biopsy site.  Your biopsy site feels warm to the touch.  You have pus or a bad smell coming from the biopsy site.  Your biopsy site breaks open after the sutures, staples, or skin adhesive strips have been removed.  You have a rash.  You have a fever. Get help right away if:  You have increased bleeding (more than a small spot) from the biopsy site.  You have difficulty breathing.  You have red streaks around the biopsy site. This information is not intended to replace advice given to you by your health care provider. Make sure you discuss any questions you have with your health care provider. Document Released: 03/04/2005 Document Revised: 04/21/2016 Document Reviewed: 05/19/2015 Elsevier Interactive Patient Education  2018 [employer].

## 2018-03-28 NOTE — Anesthesia Preprocedure Evaluation (Signed)
Anesthesia Evaluation  Patient identified by MRN, date of birth, ID band Patient awake    Reviewed: Allergy & Precautions, H&P , NPO status , Patient's Chart, lab work & pertinent test results, reviewed documented beta blocker date and time   Airway Mallampati: III  TM Distance: >3 FB Neck ROM: full    Dental no notable dental hx. (+) Dental Advidsory Given   Pulmonary neg pulmonary ROS,    Pulmonary exam normal breath sounds clear to auscultation       Cardiovascular Exercise Tolerance: Good hypertension, On Medications + angina negative cardio ROS   Rhythm:regular Rate:Normal     Neuro/Psych negative neurological ROS  negative psych ROS   GI/Hepatic negative GI ROS, Neg liver ROS,   Endo/Other  negative endocrine ROS  Renal/GU negative Renal ROS  negative genitourinary   Musculoskeletal   Abdominal   Peds  Hematology negative hematology ROS (+)   Anesthesia Other Findings SB at 52 Pre-op eval SB at 44-46 Possible diff a/w if ETT used, plan for LMA  Reproductive/Obstetrics negative OB ROS                             Anesthesia Physical Anesthesia Plan  ASA: III  Anesthesia Plan: General   Post-op Pain Management:    Induction:   PONV Risk Score and Plan:   Airway Management Planned:   Additional Equipment:   Intra-op Plan:   Post-operative Plan:   Informed Consent: I have reviewed the patients History and Physical, chart, labs and discussed the procedure including the risks, benefits and alternatives for the proposed anesthesia with the patient or authorized representative who has indicated his/her understanding and acceptance.   Dental Advisory Given  Plan Discussed with: CRNA and Anesthesiologist  Anesthesia Plan Comments:         Anesthesia Quick Evaluation

## 2018-03-29 ENCOUNTER — Encounter (HOSPITAL_COMMUNITY): Payer: Self-pay | Admitting: General Surgery

## 2018-04-10 ENCOUNTER — Ambulatory Visit (INDEPENDENT_AMBULATORY_CARE_PROVIDER_SITE_OTHER): Payer: Self-pay | Admitting: General Surgery

## 2018-04-10 ENCOUNTER — Encounter: Payer: Self-pay | Admitting: General Surgery

## 2018-04-10 VITALS — BP 150/88 | HR 51 | Temp 96.6°F | Resp 18 | Wt 142.0 lb

## 2018-04-10 DIAGNOSIS — Z09 Encounter for follow-up examination after completed treatment for conditions other than malignant neoplasm: Secondary | ICD-10-CM

## 2018-04-10 NOTE — Progress Notes (Signed)
Subjective:     Carol Duarte  Here for follow-up status post left breast biopsy.  Patient denies any pain. Objective:    BP (!) 150/88 (BP Location: Left Arm, Patient Position: Sitting, Cuff Size: Normal)   Pulse (!) 51   Temp (!) 96.6 F (35.9 C)   Resp 18   Wt 142 lb (64.4 kg)   BMI 25.56 kg/m   General:  alert, cooperative and no distress  Left breast incision healing well. Final pathology reveals left breast carcinoma with margins not clear.  It is ER PR negative.     Assessment:    Doing well postoperatively. Recurrent left breast carcinoma    Plan:   Patient does not want her left breast removed.  She understands that I did not get all the cancer out.  She would like to just watch this at the present time.  She is not a candidate for antiestrogen therapy as the cancer is ER/PR negative.  Follow-up expectantly.

## 2018-05-03 ENCOUNTER — Ambulatory Visit (INDEPENDENT_AMBULATORY_CARE_PROVIDER_SITE_OTHER): Payer: Self-pay | Admitting: General Surgery

## 2018-05-03 ENCOUNTER — Encounter: Payer: Self-pay | Admitting: General Surgery

## 2018-05-03 VITALS — BP 146/70 | HR 54 | Temp 96.9°F | Resp 16 | Wt 142.0 lb

## 2018-05-03 DIAGNOSIS — C50512 Malignant neoplasm of lower-outer quadrant of left female breast: Secondary | ICD-10-CM

## 2018-05-03 NOTE — Patient Instructions (Signed)
Total or Modified Radical Mastectomy A total mastectomy and a modified radical mastectomy are types of surgery for breast cancer. If you are having a total mastectomy (simple mastectomy), your entire breast will be removed. If you are having a modified radical mastectomy, your breast and nipple will be removed along with the lymph nodes under your arm. You may also have some of the lining over the muscle tissues under your breast removed. Let your health care provider know about:  Any allergies you have.  All medicines you are taking, including vitamins, herbs, eye drops, creams, and over-the-counter medicines.  Previous problems you or members of your family have had with the use of anesthetics.  Any blood disorders you have.  Any surgeries you have had.  Any medical conditions you have. What are the risks? Generally, this is a safe procedure. However, problems may occur, including:  Pain.  Infection.  Bleeding.  Scar tissue.  Chest numbness on the side of the surgery.  Fluid buildup under the skin flaps where your breast was removed (seroma).  Sensation of throbbing or tingling.  Stress or sadness from losing your breast.  If you have the lymph nodes under your arm removed, you may have arm swelling, weakness, or numbness on the same side of your body as your surgery. What happens before the procedure?  Ask your health care provider about: ? Changing or stopping your regular medicines. This is especially important if you are taking diabetes medicines or blood thinners. ? Taking medicines such as aspirin and ibuprofen. These medicines can thin your blood. Do not take these medicines before your procedure if your health care provider instructs you not to.  Follow your health care provider's instructions about eating or drinking restrictions.  You may be checked for extra fluid around your lymph nodes (lymphedema).  Plan to have someone take you home after the  procedure. What happens during the procedure?  An IV tube will be inserted into one of your veins.  You will be given a medicine that makes you fall asleep (general anesthetic).  Your breast will be cleaned with a germ-killing solution (antiseptic).  A wide incision will be made around your nipple. The skin and nipple inside the incision will be removed along with all breast tissue.  If you are having a modified radical mastectomy: ? The lining over your chest muscles will be removed. ? The incision may be extended to reach the lymph nodes under your arm, or a second incision may be made. ? The lymph nodes will be removed.  You may have a drainage tube inserted into your incision to collect fluid that builds up after surgery. This tube is connected to a suction bulb.  Your incision or incisions will be closed with stitches (sutures).  A bandage (dressing) will be placed over your breast and under your arm. The procedure may vary among health care providers and hospitals. What happens after the procedure?  You will be moved to a recovery area.  Your blood pressure, heart rate, breathing rate, and blood oxygen level will be monitored often until the medicines you were given have worn off.  You will be given pain medicine as needed.  After a while, you will be taken to a hospital room.  You will be encouraged to get up and walk as soon as you can.  Your IV tube can be removed when you are able to eat and drink.  Your drain may be removed before you go home   from the hospital, or you may be sent home with your drain and suction bulb. This information is not intended to replace advice given to you by your health care provider. Make sure you discuss any questions you have with your health care provider. Document Released: 05/10/2001 Document Revised: 04/21/2016 Document Reviewed: 04/30/2014 Elsevier Interactive Patient Education  2018 [employer]  

## 2018-05-03 NOTE — Progress Notes (Signed)
Subjective:     Carol Duarte  Patient returns to discuss undergoing left modified radical mastectomy as her breast cancer has recurred despite conservative treatment. Objective:    BP (!) 146/70 (BP Location: Left Arm, Patient Position: Sitting, Cuff Size: Normal)   Pulse (!) 54   Temp (!) 96.9 F (36.1 C) (Temporal)   Resp 16   Wt 142 lb (64.4 kg)   BMI 25.56 kg/m   General:  alert, cooperative and no distress       Assessment:    Recurrent left breast carcinoma    Plan:   Patient will be scheduled to undergo left modified radical mastectomy after clearance by cardiology.  She does have a history of bradycardia.  The risks and benefits of the procedure including bleeding, infection, cardiopulmonary difficulties, and the possibility of arm swelling were fully explained to the patient, who gave informed consent.

## 2018-05-11 ENCOUNTER — Encounter: Payer: Self-pay | Admitting: Cardiovascular Disease

## 2018-05-11 ENCOUNTER — Ambulatory Visit: Payer: Medicare Other | Admitting: Cardiovascular Disease

## 2018-05-11 VITALS — BP 148/64 | HR 60 | Ht 62.0 in | Wt 143.0 lb

## 2018-05-11 DIAGNOSIS — I10 Essential (primary) hypertension: Secondary | ICD-10-CM | POA: Diagnosis not present

## 2018-05-11 DIAGNOSIS — Z01818 Encounter for other preprocedural examination: Secondary | ICD-10-CM | POA: Diagnosis not present

## 2018-05-11 DIAGNOSIS — R001 Bradycardia, unspecified: Secondary | ICD-10-CM

## 2018-05-11 NOTE — Patient Instructions (Signed)
Your physician recommends that you schedule a follow-up appointment in:  As needed with [place]     Your physician recommends that you continue on your current medications as directed. Please refer to the Current Medication list given to you today.      No lab work or tests today.       Thank you for choosing [facility] !

## 2018-05-11 NOTE — Progress Notes (Signed)
SUBJECTIVE: The patient presents for preoperative risk stratification.  She is planning to undergo left modified radical mastectomy as she has breast cancer which has recurred despite conservative treatment. I last saw her 2 years ago.  She underwent a normal nuclear stress test in August 2017.  Echocardiogram at that time demonstrated vigorous left ventricular systolic function, LVEF 65 to 70%, with grade 2 diastolic dysfunction.  I reviewed her most recent ECG performed on 03/23/2018 which demonstrated sinus bradycardia, 52 bpm. She is on labetalol.  The patient denies any symptoms of chest pain, palpitations, shortness of breath, lightheadedness, dizziness, leg swelling, orthopnea, PND, and syncope.  She said "I have no problems whatsoever ".  She sweeps and mops her house by herself.  She cooks for 6 members of her family 3 nights per week and goes out dancing every Tuesday and Saturday night.  She does yard work.  She drove here by herself.    Review of Systems: As per "subjective", otherwise negative.  No Known Allergies  Current Outpatient Medications  Medication Sig Dispense Refill  . amLODipine (NORVASC) 5 MG tablet Take 5 mg by mouth daily.    Marland Kitchen aspirin EC 81 MG tablet Take 1 tablet (81 mg total) by mouth daily. 90 tablet 3  . ferrous sulfate 325 (65 FE) MG tablet Take 325 mg by mouth daily with breakfast.    . labetalol (NORMODYNE) 100 MG tablet Take 1 tablet (100 mg total) by mouth 2 (two) times daily. 60 tablet 12  . naproxen sodium (ALEVE) 220 MG tablet Take 220 mg by mouth daily as needed (for headache or pain).    Marland Kitchen oxybutynin (DITROPAN) 5 MG tablet Take 5 mg by mouth daily.     . simvastatin (ZOCOR) 40 MG tablet Take 40 mg by mouth daily.     No current facility-administered medications for this visit.     Past Medical History:  Diagnosis Date  . Cancer ([place])    left breast  . History of kidney stones   . Hypertension     Past Surgical History:    Procedure Laterality Date  . APPENDECTOMY    . brain cyst removed  2011  . BREAST BIOPSY Left 03/28/2018   Procedure: BREAST BIOPSY;  Surgeon: [name], MD;  Location: [facility];  Service: General;  Laterality: Left;  . CATARACT EXTRACTION W/PHACO Left 11/09/2015   Procedure: CATARACT EXTRACTION PHACO AND INTRAOCULAR LENS PLACEMENT LEFT EYE cde=8.97;  Surgeon: [name], MD;  Location: [facility];  Service: Ophthalmology;  Laterality: Left;  . EYE SURGERY     KPE left  . MASTECTOMY, PARTIAL Left 04/24/2017   Procedure: MASTECTOMY PARTIAL;  Surgeon: [name], MD;  Location: [facility];  Service: General;  Laterality: Left;  . TONSILLECTOMY    . TONSILLECTOMY AND ADENOIDECTOMY      Social History   Socioeconomic History  . Marital status: Widowed    Spouse name: Not on file  . Number of children: Not on file  . Years of education: Not on file  . Highest education level: Not on file  Occupational History  . Not on file  Social Needs  . Financial resource strain: Not on file  . Food insecurity:    Worry: Not on file    Inability: Not on file  . Transportation needs:    Medical: Not on file    Non-medical: Not on file  Tobacco Use  . Smoking status: Never Smoker  . Smokeless  tobacco: Never Used  Substance and Sexual Activity  . Alcohol use: No  . Drug use: No  . Sexual activity: Not Currently    Partners: Male  Lifestyle  . Physical activity:    Days per week: Not on file    Minutes per session: Not on file  . Stress: Not on file  Relationships  . Social connections:    Talks on phone: Not on file    Gets together: Not on file    Attends religious service: Not on file    Active member of club or organization: Not on file    Attends meetings of clubs or organizations: Not on file    Relationship status: Not on file  . Intimate partner violence:    Fear of current or ex partner: Not on file    Emotionally abused: Not on file    Physically abused: Not on file     Forced sexual activity: Not on file  Other Topics Concern  . Not on file  Social History Narrative  . Not on file     Vitals:   05/11/18 1357  BP: (!) 148/64  Pulse: 60  SpO2: 96%  Weight: 143 lb (64.9 kg)  Height: 5\' 2"  (1.575 m)    Wt Readings from Last 3 Encounters:  05/11/18 143 lb (64.9 kg)  05/03/18 142 lb (64.4 kg)  04/10/18 142 lb (64.4 kg)     PHYSICAL EXAM General: NAD HEENT: Normal. Neck: No JVD, no thyromegaly. Lungs: Clear to auscultation bilaterally with normal respiratory effort. CV: Regular rate and rhythm, normal S1/S2, no W5/Y0, 2/6 pansystolic murmur heard throughout precordium. No pretibial or periankle edema.  No carotid bruit.   Abdomen: Soft, nontender, no distention.  Neurologic: Alert and oriented.  Psych: Normal affect. Skin: Normal. Musculoskeletal: No gross deformities.    ECG: Reviewed above under Subjective   Labs: Lab Results  Component Value Date/Time   K 4.5 03/23/2018 03:11 PM   BUN 27 (H) 03/23/2018 03:11 PM   CREATININE 1.23 (H) 03/23/2018 03:11 PM   ALT 17 03/23/2018 03:11 PM   HGB 11.7 (L) 03/23/2018 03:11 PM     Lipids: No results found for: LDLCALC, LDLDIRECT, CHOL, TRIG, HDL     ASSESSMENT AND PLAN: 1.  Hypertension: Blood pressure is mildly elevated.  She is on amlodipine and labetalol.  No changes to therapy today.  2.  Bradycardia: Most recent ECG reviewed above.  Heart rate is 60 bpm today.  She is on labetalol.  She is asymptomatic.  No changes to therapy.  3.  Preoperative for stratification: She has very good exercise tolerance and is able to achieve 4 METS.she is very active at home.  She underwent a normal nuclear stress test in August 2017.  I do not feel she requires preoperative cardiac testing at this time.  She can proceed with an acceptable level of risk.    Disposition: Follow up as needed   Kate Sable, M.D., F.A.C.C.

## 2018-05-18 NOTE — H&P (Signed)
Carol Duarte; [id number]; [birth year]   HPI Patient is a 84 year old white female who was referred back to my care by Dr. Legrand Rams for evaluation treatment of a left breast mass.  She developed a tender area along the medial aspect of her previous left partial mastectomy scar.  She underwent a left partial mastectomy in 2018 for invasive ductal carcinoma.  She did not undergo chemotherapy or radiation therapy after the surgery.  She states she noticed the increased thickness and tenderness just below her left nipple.  She has not had a mammogram since the surgery as she says it hurts too much.  She currently has 0 out of 10 left breast pain.  She denies nipple discharge.  She recently underwent a left breast biopsy which showed recurrence of her left breast carcinoma.  She now presents for left modified radical mastectomy. Past Medical History:  Diagnosis Date  . Hypertension     Past Surgical History:  Procedure Laterality Date  . APPENDECTOMY    . brain cyst removed    . CATARACT EXTRACTION W/PHACO Left 11/09/2015   Procedure: CATARACT EXTRACTION PHACO AND INTRAOCULAR LENS PLACEMENT LEFT EYE cde=8.97;  Surgeon: [name], MD;  Location: [facility];  Service: Ophthalmology;  Laterality: Left;  . EYE SURGERY     KPE left  . MASTECTOMY, PARTIAL Left 04/24/2017   Procedure: MASTECTOMY PARTIAL;  Surgeon: [name], MD;  Location: [facility];  Service: General;  Laterality: Left;  . TONSILLECTOMY    . TONSILLECTOMY AND ADENOIDECTOMY      Family History  Problem Relation Age of Onset  . Heart failure Mother   . Heart failure Father     Current Outpatient Medications on File Prior to Visit  Medication Sig Dispense Refill  . acetaminophen (TYLENOL) 500 MG tablet Take 1,000 mg by mouth every 6 (six) hours as needed for mild pain or fever.     Marland Kitchen amLODipine (NORVASC) 5 MG tablet Take 5 mg by mouth daily.    Marland Kitchen aspirin EC 81 MG tablet Take 1 tablet (81 mg total) by mouth daily. 90 tablet 3  . ferrous  sulfate 325 (65 FE) MG tablet Take 325 mg by mouth daily with breakfast.    . labetalol (NORMODYNE) 100 MG tablet Take 1 tablet (100 mg total) by mouth 2 (two) times daily. 60 tablet 12  . oxybutynin (DITROPAN) 5 MG tablet Take 5 mg by mouth daily.     . simvastatin (ZOCOR) 40 MG tablet Take 40 mg by mouth daily.     No current facility-administered medications on file prior to visit.     No Known Allergies  Social History   Substance and Sexual Activity  Alcohol Use No    Social History   Tobacco Use  Smoking Status Never Smoker  Smokeless Tobacco Former Systems developer    Review of Systems  Constitutional: Negative.   HENT: Negative.   Eyes: Negative.   Respiratory: Negative.   Cardiovascular: Negative.   Gastrointestinal: Negative.   Genitourinary: Negative.   Musculoskeletal: Negative.   Skin: Negative.   Neurological: Negative.   Endo/Heme/Allergies: Negative.   Psychiatric/Behavioral: Negative.     Objective   Vitals:   03/13/18 1546  BP: (!) 178/97  Pulse: (!) 58  Resp: 20  Temp: (!) 96.9 F (36.1 C)    Physical Exam  Constitutional: She is oriented to person, place, and time. She appears well-developed and well-nourished. No distress.  HENT:  Head: Normocephalic and atraumatic.  Cardiovascular: Normal rate, regular  rhythm and normal heart sounds. Exam reveals no gallop and no friction rub.  No murmur heard. Pulmonary/Chest: Effort normal and breath sounds normal. No stridor. No respiratory distress. She has no wheezes. She has no rales.  Abdominal: Soft. Bowel sounds are normal. She exhibits no distension and no mass. There is no tenderness. There is no guarding.  Neurological: She is alert and oriented to person, place, and time.  Skin: Skin is warm and dry.  Vitals reviewed. Breast: Left breast with well-healed surgical scar along the inferior outer portion of the left breast.  Right breast examination unremarkable.  Axilla is negative for palpable  nodes.  Assessment  Recurrent left breast carcinoma Plan   Scheduled for left modified radical mastectomy on 05/28/2018.   The risks and benefits of the procedure including bleeding, infection, cardiopulmonary difficulties, and left arm swelling were fully explained to the patient, who gave informed consent.

## 2018-05-21 NOTE — Patient Instructions (Signed)
Carol Duarte  05/21/2018     @PREFPERIOPPHARMACY @   Your procedure is scheduled on  05/28/2018 .  Report to [facility] at  800  A.M.  Call this number if you have problems the morning of surgery:  [phone]   Remember:  Do not eat or drink after midnight.  You may drink clear liquids until  12 midnight 05/27/2018 .  Clear liquids allowed are:                    Water, Juice (non-citric and without pulp), Carbonated beverages, Clear Tea, Black Coffee only, Plain Jell-O only, Gatorade and Plain Popsicles only    Take these medicines the morning of surgery with A SIP OF WATER  Labetolol, amlodipine, ditropan.    Do not wear jewelry, make-up or nail polish.  Do not wear lotions, powders, or perfumes, or deodorant.  Do not shave 48 hours prior to surgery.  Men may shave face and neck.  Do not bring valuables to the hospital.  [facility] is not responsible for any belongings or valuables.  Contacts, dentures or bridgework may not be worn into surgery.  Leave your suitcase in the car.  After surgery it may be brought to your room.  For patients admitted to the hospital, discharge time will be determined by your treatment team.  Patients discharged the day of surgery will not be allowed to drive home.   Name and phone number of your driver:   family Special instructions:  None  Please read over the following fact sheets that you were given. Anesthesia Post-op Instructions and Care and Recovery After Surgery      Total or Modified Radical Mastectomy A total mastectomy and a modified radical mastectomy are types of surgery for breast cancer. If you are having a total mastectomy (simple mastectomy), your entire breast will be removed. If you are having a modified radical mastectomy, your breast and nipple will be removed along with the lymph nodes under your arm. You may also have some of the lining over the muscle tissues under your breast removed. Let your  health care provider know about:  Any allergies you have.  All medicines you are taking, including vitamins, herbs, eye drops, creams, and over-the-counter medicines.  Previous problems you or members of your family have had with the use of anesthetics.  Any blood disorders you have.  Any surgeries you have had.  Any medical conditions you have. What are the risks? Generally, this is a safe procedure. However, problems may occur, including:  Pain.  Infection.  Bleeding.  Scar tissue.  Chest numbness on the side of the surgery.  Fluid buildup under the skin flaps where your breast was removed (seroma).  Sensation of throbbing or tingling.  Stress or sadness from losing your breast.  If you have the lymph nodes under your arm removed, you may have arm swelling, weakness, or numbness on the same side of your body as your surgery. What happens before the procedure?  Ask your health care provider about: ? Changing or stopping your regular medicines. This is especially important if you are taking diabetes medicines or blood thinners. ? Taking medicines such as aspirin and ibuprofen. These medicines can thin your blood. Do not take these medicines before your procedure if your health care provider instructs you not to.  Follow your health care provider's instructions about eating or drinking restrictions.  You may  be checked for extra fluid around your lymph nodes (lymphedema).  Plan to have someone take you home after the procedure. What happens during the procedure?  An IV tube will be inserted into one of your veins.  You will be given a medicine that makes you fall asleep (general anesthetic).  Your breast will be cleaned with a germ-killing solution (antiseptic).  A wide incision will be made around your nipple. The skin and nipple inside the incision will be removed along with all breast tissue.  If you are having a modified radical mastectomy: ? The lining over  your chest muscles will be removed. ? The incision may be extended to reach the lymph nodes under your arm, or a second incision may be made. ? The lymph nodes will be removed.  You may have a drainage tube inserted into your incision to collect fluid that builds up after surgery. This tube is connected to a suction bulb.  Your incision or incisions will be closed with stitches (sutures).  A bandage (dressing) will be placed over your breast and under your arm. The procedure may vary among health care providers and hospitals. What happens after the procedure?  You will be moved to a recovery area.  Your blood pressure, heart rate, breathing rate, and blood oxygen level will be monitored often until the medicines you were given have worn off.  You will be given pain medicine as needed.  After a while, you will be taken to a hospital room.  You will be encouraged to get up and walk as soon as you can.  Your IV tube can be removed when you are able to eat and drink.  Your drain may be removed before you go home from the hospital, or you may be sent home with your drain and suction bulb. This information is not intended to replace advice given to you by your health care provider. Make sure you discuss any questions you have with your health care provider. Document Released: 05/10/2001 Document Revised: 04/21/2016 Document Reviewed: 04/30/2014 Elsevier Interactive Patient Education  2018 [place]. Total or Modified Radical Mastectomy, Care After Refer to this sheet in the next few weeks. These instructions provide you with information about caring for yourself after your procedure. Your health care provider may also give you more specific instructions. Your treatment has been planned according to current medical practices, but problems sometimes occur. Call your health care provider if you have any problems or questions after your procedure. What can I expect after the procedure? After  your procedure, it is common to have:  Pain.  Numbness.  Stiffness in your arm or shoulder.  Feelings of stress, sadness, or depression.  If the lymph nodes under your arm were removed, you may have arm swelling, weakness, or numbness on the same side of your body as your surgery. Follow these instructions at home: Incision care  There are many different ways to close and cover an incision, including stitches, skin glue, and adhesive strips. Follow your health care provider's instructions about: ? Incision care. ? Bandage (dressing) changes and removal. ? Incision closure removal.  Check your incision area every day for signs of infection. Watch for: ? Redness, swelling, or pain. ? Fluid, blood, or pus.  If you were sent home with a surgical drain in place, follow your health care provider's instructions for emptying it. Bathing  Do not take baths, swim, or use a hot tub until your health care provider approves.  Take  sponge baths until your health care provider says that you can start showering or bathing. Activity  Return to your normal activities as directed by your health care provider.  Avoid strenuous exercise.  Be careful to avoid any activities that could cause an injury to your arm on the side of your surgery.  Do not lift anything that is heavier than 10 lb (4.5 kg). Avoid lifting with the arm that is on the side of your surgery.  Do not carry heavy objects on your shoulder.  After your drain is removed, you should perform exercises to keep your arm from getting stiff and swollen. Talk with your health care provider about which exercises are safe for you. General instructions  Take medicines only as directed by your health care provider.  You may eat what you usually do.  Keep your arm elevated when at rest.  Do not wear tight jewelry on your arm, wrist, or fingers on the side of your surgery.  Get checked for extra fluid around your lymph nodes  (lymphedema) as often as told by your health care provider.  If you had a modified radical mastectomy, always let your health care providers know that lymph nodes under your arm were removed. This is important information to share before you are involved in certain procedures, such as giving blood or having your blood pressure taken. Contact a health care provider if:  You have a fever.  Your pain medicine is not working.  Your arm swelling, weakness, or numbness has not improved after a few weeks.  You have new swelling in your breast or arm.  You have redness, swelling, or pain in your incision area.  You have fluid, blood, or pus coming from your incision. Get help right away if:  You have very bad pain in your breast or arm.  You have chest pain.  You have difficulty breathing. This information is not intended to replace advice given to you by your health care provider. Make sure you discuss any questions you have with your health care provider. Document Released: 04/07/2004 Document Revised: 04/21/2016 Document Reviewed: 04/30/2014 Elsevier Interactive Patient Education  2018 [place] Anesthesia, Adult General anesthesia is the use of medicines to make a person "go to sleep" (be unconscious) for a medical procedure. General anesthesia is often recommended when a procedure:  Is long.  Requires you to be still or in an unusual position.  Is major and can cause you to lose blood.  Is impossible to do without general anesthesia.  The medicines used for general anesthesia are called general anesthetics. In addition to making you sleep, the medicines:  Prevent pain.  Control your blood pressure.  Relax your muscles.  Tell a health care provider about:  Any allergies you have.  All medicines you are taking, including vitamins, herbs, eye drops, creams, and over-the-counter medicines.  Any problems you or family members have had with anesthetic  medicines.  Types of anesthetics you have had in the past.  Any bleeding disorders you have.  Any surgeries you have had.  Any medical conditions you have.  Any history of heart or lung conditions, such as heart failure, sleep apnea, or chronic obstructive pulmonary disease (COPD).  Whether you are pregnant or may be pregnant.  Whether you use tobacco, alcohol, marijuana, or street drugs.  Any history of Armed forces logistics/support/administrative officer.  Any history of depression or anxiety. What are the risks? Generally, this is a safe procedure. However, problems may occur,  including:  Allergic reaction to anesthetics.  Lung and heart problems.  Inhaling food or liquids from your stomach into your lungs (aspiration).  Injury to nerves.  Waking up during your procedure and being unable to move (rare).  Extreme agitation or a state of mental confusion (delirium) when you wake up from the anesthetic.  Air in the bloodstream, which can lead to stroke.  These problems are more likely to develop if you are having a major surgery or if you have an advanced medical condition. You can prevent some of these complications by answering all of your health care provider's questions thoroughly and by following all pre-procedure instructions. General anesthesia can cause side effects, including:  Nausea or vomiting  A sore throat from the breathing tube.  Feeling cold or shivery.  Feeling tired, washed out, or achy.  Sleepiness or drowsiness.  Confusion or agitation.  What happens before the procedure? Staying hydrated Follow instructions from your health care provider about hydration, which may include:  Up to 2 hours before the procedure - you may continue to drink clear liquids, such as water, clear fruit juice, black coffee, and plain tea.  Eating and drinking restrictions Follow instructions from your health care provider about eating and drinking, which may include:  8 hours before the procedure  - stop eating heavy meals or foods such as meat, fried foods, or fatty foods.  6 hours before the procedure - stop eating light meals or foods, such as toast or cereal.  6 hours before the procedure - stop drinking milk or drinks that contain milk.  2 hours before the procedure - stop drinking clear liquids.  Medicines  Ask your health care provider about: ? Changing or stopping your regular medicines. This is especially important if you are taking diabetes medicines or blood thinners. ? Taking medicines such as aspirin and ibuprofen. These medicines can thin your blood. Do not take these medicines before your procedure if your health care provider instructs you not to. ? Taking new dietary supplements or medicines. Do not take these during the week before your procedure unless your health care provider approves them.  If you are told to take a medicine or to continue taking a medicine on the day of the procedure, take the medicine with sips of water. General instructions   Ask if you will be going home the same day, the following day, or after a longer hospital stay. ? Plan to have someone take you home. ? Plan to have someone stay with you for the first 24 hours after you leave the hospital or clinic.  For 3-6 weeks before the procedure, try not to use any tobacco products, such as cigarettes, chewing tobacco, and e-cigarettes.  You may brush your teeth on the morning of the procedure, but make sure to spit out the toothpaste. What happens during the procedure?  You will be given anesthetics through a mask and through an IV tube in one of your veins.  You may receive medicine to help you relax (sedative).  As soon as you are asleep, a breathing tube may be used to help you breathe.  An anesthesia specialist will stay with you throughout the procedure. He or she will help keep you comfortable and safe by continuing to give you medicines and adjusting the amount of medicine that you  get. He or she will also watch your blood pressure, pulse, and oxygen levels to make sure that the anesthetics do not cause any problems.  If a breathing tube was used to help you breathe, it will be removed before you wake up. The procedure may vary among health care providers and hospitals. What happens after the procedure?  You will wake up, often slowly, after the procedure is complete, usually in a recovery area.  Your blood pressure, heart rate, breathing rate, and blood oxygen level will be monitored until the medicines you were given have worn off.  You may be given medicine to help you calm down if you feel anxious or agitated.  If you will be going home the same day, your health care provider may check to make sure you can stand, drink, and urinate.  Your health care providers will treat your pain and side effects before you go home.  Do not drive for 24 hours if you received a sedative.  You may: ? Feel nauseous and vomit. ? Have a sore throat. ? Have mental slowness. ? Feel cold or shivery. ? Feel sleepy. ? Feel tired. ? Feel sore or achy, even in parts of your body where you did not have surgery. This information is not intended to replace advice given to you by your health care provider. Make sure you discuss any questions you have with your health care provider. Document Released: 11/22/2007 Document Revised: 01/26/2016 Document Reviewed: 07/30/2015 Elsevier Interactive Patient Education  2018 [place] Anesthesia, Adult, Care After These instructions provide you with information about caring for yourself after your procedure. Your health care provider may also give you more specific instructions. Your treatment has been planned according to current medical practices, but problems sometimes occur. Call your health care provider if you have any problems or questions after your procedure. What can I expect after the procedure? After the procedure, it is  common to have:  Vomiting.  A sore throat.  Mental slowness.  It is common to feel:  Nauseous.  Cold or shivery.  Sleepy.  Tired.  Sore or achy, even in parts of your body where you did not have surgery.  Follow these instructions at home: For at least 24 hours after the procedure:  Do not: ? Participate in activities where you could fall or become injured. ? Drive. ? Use heavy machinery. ? Drink alcohol. ? Take sleeping pills or medicines that cause drowsiness. ? Make important decisions or sign legal documents. ? Take care of children on your own.  Rest. Eating and drinking  If you vomit, drink water, juice, or soup when you can drink without vomiting.  Drink enough fluid to keep your urine clear or pale yellow.  Make sure you have little or no nausea before eating solid foods.  Follow the diet recommended by your health care provider. General instructions  Have a responsible adult stay with you until you are awake and alert.  Return to your normal activities as told by your health care provider. Ask your health care provider what activities are safe for you.  Take over-the-counter and prescription medicines only as told by your health care provider.  If you smoke, do not smoke without supervision.  Keep all follow-up visits as told by your health care provider. This is important. Contact a health care provider if:  You continue to have nausea or vomiting at home, and medicines are not helpful.  You cannot drink fluids or start eating again.  You cannot urinate after 8-12 hours.  You develop a skin rash.  You have fever.  You have increasing redness at the site  of your procedure. Get help right away if:  You have difficulty breathing.  You have chest pain.  You have unexpected bleeding.  You feel that you are having a life-threatening or urgent problem. This information is not intended to replace advice given to you by your health care  provider. Make sure you discuss any questions you have with your health care provider. Document Released: 11/21/2000 Document Revised: 01/18/2016 Document Reviewed: 07/30/2015 Elsevier Interactive Patient Education  [employer].

## 2018-05-23 ENCOUNTER — Other Ambulatory Visit: Payer: Self-pay

## 2018-05-23 ENCOUNTER — Encounter (HOSPITAL_COMMUNITY)
Admission: RE | Admit: 2018-05-23 | Discharge: 2018-05-23 | Disposition: A | Discharge status: Home / Self Care | Payer: Medicare Other | Source: Ambulatory Visit | Attending: General Surgery | Admitting: General Surgery

## 2018-05-23 ENCOUNTER — Encounter (HOSPITAL_COMMUNITY): Payer: Self-pay

## 2018-05-23 DIAGNOSIS — Z01812 Encounter for preprocedural laboratory examination: Secondary | ICD-10-CM | POA: Insufficient documentation

## 2018-05-23 HISTORY — DX: Pure hypercholesterolemia, unspecified: E78.00

## 2018-05-23 LAB — CBC WITH DIFFERENTIAL/PLATELET
Basophils Absolute: 0 10*3/uL (ref 0.0–0.1)
Basophils Relative: 1 %
Eosinophils Absolute: 0.2 10*3/uL (ref 0.0–0.7)
Eosinophils Relative: 5 %
HEMATOCRIT: 35.9 % — AB (ref 36.0–46.0)
Hemoglobin: 11.5 g/dL — ABNORMAL LOW (ref 12.0–15.0)
LYMPHS PCT: 27 %
Lymphs Abs: 1.1 10*3/uL (ref 0.7–4.0)
MCH: 30.8 pg (ref 26.0–34.0)
MCHC: 32 g/dL (ref 30.0–36.0)
MCV: 96.2 fL (ref 78.0–100.0)
MONO ABS: 0.3 10*3/uL (ref 0.1–1.0)
MONOS PCT: 7 %
NEUTROS ABS: 2.6 10*3/uL (ref 1.7–7.7)
Neutrophils Relative %: 60 %
Platelets: 199 10*3/uL (ref 150–400)
RBC: 3.73 MIL/uL — ABNORMAL LOW (ref 3.87–5.11)
RDW: 13.5 % (ref 11.5–15.5)
WBC: 4.2 10*3/uL (ref 4.0–10.5)

## 2018-05-23 LAB — BASIC METABOLIC PANEL
ANION GAP: 7 (ref 5–15)
BUN: 31 mg/dL — ABNORMAL HIGH (ref 8–23)
CHLORIDE: 108 mmol/L (ref 98–111)
CO2: 23 mmol/L (ref 22–32)
Calcium: 8.8 mg/dL — ABNORMAL LOW (ref 8.9–10.3)
Creatinine, Ser: 1.24 mg/dL — ABNORMAL HIGH (ref 0.44–1.00)
GFR calc non Af Amer: 37 mL/min — ABNORMAL LOW (ref >60)
GFR, EST AFRICAN AMERICAN: 43 mL/min — AB (ref >60)
GLUCOSE: 121 mg/dL — AB (ref 70–99)
Potassium: 4.7 mmol/L (ref 3.5–5.1)
Sodium: 138 mmol/L (ref 135–145)

## 2018-05-28 ENCOUNTER — Encounter (HOSPITAL_COMMUNITY)
Admission: RE | Disposition: A | Discharge status: Home Health | Payer: Self-pay | Source: Ambulatory Visit | Attending: General Surgery

## 2018-05-28 ENCOUNTER — Ambulatory Visit (HOSPITAL_COMMUNITY): Payer: Medicare Other | Admitting: Anesthesiology

## 2018-05-28 ENCOUNTER — Other Ambulatory Visit: Payer: Self-pay

## 2018-05-28 ENCOUNTER — Encounter (HOSPITAL_COMMUNITY): Payer: Self-pay | Admitting: *Deleted

## 2018-05-28 ENCOUNTER — Observation Stay (HOSPITAL_COMMUNITY)
Admission: RE | Admit: 2018-05-28 | Discharge: 2018-05-29 | Disposition: A | Discharge status: Home Health | Payer: Medicare Other | Source: Ambulatory Visit | Attending: General Surgery | Admitting: General Surgery

## 2018-05-28 DIAGNOSIS — Z7982 Long term (current) use of aspirin: Secondary | ICD-10-CM | POA: Insufficient documentation

## 2018-05-28 DIAGNOSIS — C779 Secondary and unspecified malignant neoplasm of lymph node, unspecified: Secondary | ICD-10-CM | POA: Diagnosis not present

## 2018-05-28 DIAGNOSIS — Z79899 Other long term (current) drug therapy: Secondary | ICD-10-CM | POA: Diagnosis not present

## 2018-05-28 DIAGNOSIS — Z853 Personal history of malignant neoplasm of breast: Secondary | ICD-10-CM | POA: Insufficient documentation

## 2018-05-28 DIAGNOSIS — C773 Secondary and unspecified malignant neoplasm of axilla and upper limb lymph nodes: Secondary | ICD-10-CM | POA: Diagnosis not present

## 2018-05-28 DIAGNOSIS — N6092 Unspecified benign mammary dysplasia of left breast: Secondary | ICD-10-CM | POA: Insufficient documentation

## 2018-05-28 DIAGNOSIS — C50512 Malignant neoplasm of lower-outer quadrant of left female breast: Principal | ICD-10-CM | POA: Insufficient documentation

## 2018-05-28 DIAGNOSIS — Z9012 Acquired absence of left breast and nipple: Secondary | ICD-10-CM

## 2018-05-28 DIAGNOSIS — C50912 Malignant neoplasm of unspecified site of left female breast: Secondary | ICD-10-CM | POA: Diagnosis not present

## 2018-05-28 DIAGNOSIS — I10 Essential (primary) hypertension: Secondary | ICD-10-CM | POA: Diagnosis not present

## 2018-05-28 HISTORY — PX: MASTECTOMY MODIFIED RADICAL: SHX5962

## 2018-05-28 SURGERY — MASTECTOMY, MODIFIED RADICAL
Anesthesia: General | Site: Breast | Laterality: Left

## 2018-05-28 MED ORDER — ACETAMINOPHEN 325 MG PO TABS: 650.0000 mg | ORAL_TABLET | Freq: Four times a day (QID) | ORAL | Status: DC | PRN

## 2018-05-28 MED ORDER — FENTANYL CITRATE (PF) 100 MCG/2ML IJ SOLN
INTRAMUSCULAR | Status: AC
  Filled 2018-05-28: qty 4

## 2018-05-28 MED ORDER — LABETALOL HCL 200 MG PO TABS
100.0000 mg | ORAL_TABLET | Freq: Two times a day (BID) | ORAL | Status: DC
  Administered 2018-05-29: 100 mg via ORAL
  Filled 2018-05-28 (×2): qty 1

## 2018-05-28 MED ORDER — SODIUM CHLORIDE 0.9 % IV SOLN
INTRAVENOUS | Status: DC
  Administered 2018-05-28: 14:00:00 via INTRAVENOUS

## 2018-05-28 MED ORDER — MORPHINE SULFATE (PF) 2 MG/ML IV SOLN: 1.0000 mg | INTRAVENOUS | Status: DC | PRN

## 2018-05-28 MED ORDER — ACETAMINOPHEN 650 MG RE SUPP: 650.0000 mg | Freq: Four times a day (QID) | RECTAL | Status: DC | PRN

## 2018-05-28 MED ORDER — CHLORHEXIDINE GLUCONATE CLOTH 2 % EX PADS: 6.0000 | MEDICATED_PAD | Freq: Once | CUTANEOUS | Status: DC

## 2018-05-28 MED ORDER — ENOXAPARIN SODIUM 40 MG/0.4ML ~~LOC~~ SOLN
SUBCUTANEOUS | Status: AC
  Filled 2018-05-28: qty 0.4

## 2018-05-28 MED ORDER — SODIUM CHLORIDE 0.9 % IJ SOLN
INTRAMUSCULAR | Status: AC
  Filled 2018-05-28: qty 10

## 2018-05-28 MED ORDER — POVIDONE-IODINE 10 % OINT PACKET
TOPICAL_OINTMENT | CUTANEOUS | Status: DC | PRN
  Administered 2018-05-28: 1 via TOPICAL

## 2018-05-28 MED ORDER — AMLODIPINE BESYLATE 5 MG PO TABS
5.0000 mg | ORAL_TABLET | Freq: Every day | ORAL | Status: DC
  Administered 2018-05-29: 5 mg via ORAL
  Filled 2018-05-28: qty 1

## 2018-05-28 MED ORDER — SIMETHICONE 80 MG PO CHEW: 40.0000 mg | CHEWABLE_TABLET | Freq: Four times a day (QID) | ORAL | Status: DC | PRN

## 2018-05-28 MED ORDER — LACTATED RINGERS IV SOLN: INTRAVENOUS | Status: DC

## 2018-05-28 MED ORDER — VANCOMYCIN HCL IN DEXTROSE 1-5 GM/200ML-% IV SOLN
INTRAVENOUS | Status: AC
  Filled 2018-05-28: qty 200

## 2018-05-28 MED ORDER — BUPIVACAINE LIPOSOME 1.3 % IJ SUSP
INTRAMUSCULAR | Status: DC | PRN
  Administered 2018-05-28: 20 mL

## 2018-05-28 MED ORDER — 0.9 % SODIUM CHLORIDE (POUR BTL) OPTIME
TOPICAL | Status: DC | PRN
  Administered 2018-05-28: 1000 mL

## 2018-05-28 MED ORDER — ONDANSETRON 4 MG PO TBDP: 4.0000 mg | ORAL_TABLET | Freq: Four times a day (QID) | ORAL | Status: DC | PRN

## 2018-05-28 MED ORDER — TRAMADOL HCL 50 MG PO TABS: 50.0000 mg | ORAL_TABLET | Freq: Two times a day (BID) | ORAL | Status: DC | PRN

## 2018-05-28 MED ORDER — PROMETHAZINE HCL 25 MG/ML IJ SOLN: 6.2500 mg | INTRAMUSCULAR | Status: DC | PRN

## 2018-05-28 MED ORDER — ENOXAPARIN SODIUM 30 MG/0.3ML ~~LOC~~ SOLN
30.0000 mg | SUBCUTANEOUS | Status: DC
  Administered 2018-05-29: 30 mg via SUBCUTANEOUS
  Filled 2018-05-28: qty 0.3

## 2018-05-28 MED ORDER — ENOXAPARIN SODIUM 40 MG/0.4ML ~~LOC~~ SOLN
40.0000 mg | Freq: Once | SUBCUTANEOUS | Status: AC
  Administered 2018-05-28: 40 mg via SUBCUTANEOUS

## 2018-05-28 MED ORDER — FERROUS SULFATE 325 (65 FE) MG PO TABS
325.0000 mg | ORAL_TABLET | Freq: Every day | ORAL | Status: DC
  Administered 2018-05-29: 325 mg via ORAL
  Filled 2018-05-28: qty 1

## 2018-05-28 MED ORDER — LACTATED RINGERS IV SOLN
INTRAVENOUS | Status: DC
  Administered 2018-05-28: 09:00:00 via INTRAVENOUS

## 2018-05-28 MED ORDER — ONDANSETRON HCL 4 MG/2ML IJ SOLN: 4.0000 mg | Freq: Four times a day (QID) | INTRAMUSCULAR | Status: DC | PRN

## 2018-05-28 MED ORDER — VANCOMYCIN HCL IN DEXTROSE 1-5 GM/200ML-% IV SOLN
1000.0000 mg | INTRAVENOUS | Status: AC
  Administered 2018-05-28: 1000 mg via INTRAVENOUS

## 2018-05-28 MED ORDER — FENTANYL CITRATE (PF) 100 MCG/2ML IJ SOLN
INTRAMUSCULAR | Status: DC | PRN
  Administered 2018-05-28 (×2): 25 ug via INTRAVENOUS

## 2018-05-28 MED ORDER — HYDRALAZINE HCL 20 MG/ML IJ SOLN
INTRAMUSCULAR | Status: AC
  Filled 2018-05-28: qty 1

## 2018-05-28 MED ORDER — SODIUM CHLORIDE 0.9 % IV SOLN
INTRAVENOUS | Status: DC | PRN
  Administered 2018-05-28: 09:00:00 via INTRAVENOUS

## 2018-05-28 MED ORDER — OXYBUTYNIN CHLORIDE 5 MG PO TABS
5.0000 mg | ORAL_TABLET | Freq: Every day | ORAL | Status: DC
  Administered 2018-05-29: 5 mg via ORAL
  Filled 2018-05-28: qty 1

## 2018-05-28 MED ORDER — SIMVASTATIN 20 MG PO TABS
40.0000 mg | ORAL_TABLET | Freq: Every day | ORAL | Status: DC
  Administered 2018-05-28: 40 mg via ORAL
  Filled 2018-05-28: qty 2

## 2018-05-28 MED ORDER — PROPOFOL 10 MG/ML IV BOLUS
INTRAVENOUS | Status: DC | PRN
  Administered 2018-05-28: 120 mg via INTRAVENOUS

## 2018-05-28 MED ORDER — MEPERIDINE HCL 50 MG/ML IJ SOLN: 6.2500 mg | INTRAMUSCULAR | Status: DC | PRN

## 2018-05-28 MED ORDER — HYDROMORPHONE HCL 1 MG/ML IJ SOLN: 0.2500 mg | INTRAMUSCULAR | Status: DC | PRN

## 2018-05-28 MED ORDER — HYDROCODONE-ACETAMINOPHEN 7.5-325 MG PO TABS: 1.0000 | ORAL_TABLET | Freq: Once | ORAL | Status: DC | PRN

## 2018-05-28 MED ORDER — HYDRALAZINE HCL 20 MG/ML IJ SOLN
INTRAMUSCULAR | Status: DC | PRN
  Administered 2018-05-28: 4 mg via INTRAVENOUS

## 2018-05-28 SURGICAL SUPPLY — 40 items
APPLIER CLIP 9.375 SM OPEN (CLIP) ×6
APR CLP SM 9.3 20 MLT OPN (CLIP) ×2
BINDER BREAST LRG (GAUZE/BANDAGES/DRESSINGS) ×4 IMPLANT
CHLORAPREP W/TINT 26ML (MISCELLANEOUS) ×3 IMPLANT
CLIP APPLIE 9.375 SM OPEN (CLIP) IMPLANT
CLOTH BEACON ORANGE TIMEOUT ST (SAFETY) ×3 IMPLANT
COVER LIGHT HANDLE STERIS (MISCELLANEOUS) ×6 IMPLANT
DRAPE PROXIMA HALF (DRAPES) ×3 IMPLANT
ELECT REM PT RETURN 9FT ADLT (ELECTROSURGICAL) ×3
ELECTRODE REM PT RTRN 9FT ADLT (ELECTROSURGICAL) ×1 IMPLANT
EVACUATOR DRAINAGE 10X20 100CC (DRAIN) ×1 IMPLANT
EVACUATOR SILICONE 100CC (DRAIN) ×3
GAUZE SPONGE 4X4 12PLY STRL (GAUZE/BANDAGES/DRESSINGS) ×3 IMPLANT
GLOVE BIO SURGEON STRL SZ7 (GLOVE) ×2 IMPLANT
GLOVE BIOGEL PI IND STRL 7.0 (GLOVE) ×3 IMPLANT
GLOVE BIOGEL PI INDICATOR 7.0 (GLOVE) ×8
GLOVE ECLIPSE 6.5 STRL STRAW (GLOVE) ×2 IMPLANT
GLOVE SURG SS PI 7.5 STRL IVOR (GLOVE) ×3 IMPLANT
GOWN STRL REUS W/TWL LRG LVL3 (GOWN DISPOSABLE) ×9 IMPLANT
INST SET MINOR GENERAL (KITS) ×3 IMPLANT
KIT TURNOVER KIT A (KITS) ×3 IMPLANT
MANIFOLD NEPTUNE II (INSTRUMENTS) ×3 IMPLANT
NDL HYPO 21X1.5 SAFETY (NEEDLE) ×1 IMPLANT
NEEDLE HYPO 21X1.5 SAFETY (NEEDLE) ×3 IMPLANT
NS IRRIG 1000ML POUR BTL (IV SOLUTION) ×3 IMPLANT
PACK MINOR (CUSTOM PROCEDURE TRAY) ×3 IMPLANT
PAD ABD 5X9 TENDERSORB (GAUZE/BANDAGES/DRESSINGS) ×6 IMPLANT
PAD ARMBOARD 7.5X6 YLW CONV (MISCELLANEOUS) ×3 IMPLANT
SET BASIN LINEN APH (SET/KITS/TRAYS/PACK) ×3 IMPLANT
SPONGE DRAIN TRACH 4X4 STRL 2S (GAUZE/BANDAGES/DRESSINGS) ×3 IMPLANT
SPONGE INTESTINAL PEANUT (DISPOSABLE) ×3 IMPLANT
SPONGE LAP 18X18 X RAY DECT (DISPOSABLE) ×6 IMPLANT
STAPLER VISISTAT (STAPLE) ×3 IMPLANT
SUT ETHILON 3 0 FSL (SUTURE) ×3 IMPLANT
SUT SILK 2 0 (SUTURE) ×3
SUT SILK 2 0 SH (SUTURE) ×3 IMPLANT
SUT SILK 2-0 18XBRD TIE 12 (SUTURE) ×1 IMPLANT
SUT VIC AB 2-0 CT1 27 (SUTURE) ×15
SUT VIC AB 2-0 CT1 TAPERPNT 27 (SUTURE) ×4 IMPLANT
SYR 20CC LL (SYRINGE) ×3 IMPLANT

## 2018-05-28 NOTE — Progress Notes (Signed)
Awake. Denies pain. Continues waiting on room availability. Wants to see family. Moves to preop rm 4. Family at side.

## 2018-05-28 NOTE — Interval H&P Note (Signed)
History and Physical Interval Note:  05/28/2018 8:37 AM  Carol Duarte [employer]  has presented today for surgery, with the diagnosis of left breast cancer  The various methods of treatment have been discussed with the patient and family. After consideration of risks, benefits and other options for treatment, the patient has consented to  Procedure(s): MASTECTOMY MODIFIED RADICAL (Left) as a surgical intervention .  The patient's history has been reviewed, patient examined, no change in status, stable for surgery.  I have reviewed the patient's chart and labs.  Questions were answered to the patient's satisfaction.     [name]

## 2018-05-28 NOTE — Progress Notes (Signed)
Reported to [facility] upon arrival to room 338.

## 2018-05-28 NOTE — Op Note (Signed)
Patient:  Carol Duarte  DOB:  02/18/1925  MRN:  [id number]   Preop Diagnosis: Recurrent left breast carcinoma  Postop Diagnosis: Same  Procedure: Left modified radical mastectomy  Surgeon: [name], MD  Anes: General endotracheal  Indications: Patient is a 84 year old white female who has had multiple partial mastectomies of the left breast for left breast carcinoma in the past who now presents for a left modified radical mastectomy.  The risks and benefits of the procedure including bleeding, infection, nerve injury, and left arm swelling were fully explained to the patient, who gave informed consent.  Procedure note: The patient was placed in supine position.  After induction of general endotracheal anesthesia, the left breast was prepped and draped using the usual sterile technique with DuraPrep.  Surgical site confirmation was performed.  An elliptical incision was made medial to lateral around the left nipple, including the previous mastectomy site.  A superior flap was formed to the clavicle and an inferior flap form to the chest Corprew.  The breast was then removed medial to lateral off the pectoralis major muscle.  A suture was placed superiorly for orientation purposes.  The left breast was removed and sent to pathology for further examination.  On inspection of the axilla, 3 enlarged lymph nodes were found.  A level 2 dissection of the left axilla was performed, including the 3 obviously palpable lymph nodes.  Care was taken to avoid the neurovascular areas of the left axilla.  A bleeding was controlled using small clips.  The left axilla and flap was then copiously irrigated with normal saline.  A #10 flat Jackson-Pratt drain was placed under the flap and secured to the skin with a 3-0 nylon interrupted suture.  The subcutaneous layer was reapproximated using 2-0 Vicryl interrupted sutures.  Exparel was instilled in the surrounding wound.  The skin was closed using staples.   Betadine ointment and dry sterile dressings were applied.  All tape and needle counts were correct at the end of the procedure.  The patient was extubated in the operating room and transferred to PACU in stable condition.  Complications: None  EBL: 35 cc  Specimen: Left breast, left axillary lymph nodes  Drains: Jackson-Pratt drain to left flap and axilla

## 2018-05-28 NOTE — Anesthesia Postprocedure Evaluation (Signed)
Anesthesia Post Note  Patient: Carol Duarte  Procedure(s) Performed: LEFT MODIFIED RADICAL MASTECTOMY (Left Breast)  Patient location during evaluation: PACU Anesthesia Type: General Level of consciousness: awake and alert and patient cooperative Pain management: satisfactory to patient Vital Signs Assessment: post-procedure vital signs reviewed and stable Respiratory status: spontaneous breathing and patient connected to nasal cannula oxygen Cardiovascular status: stable Postop Assessment: no apparent nausea or vomiting Anesthetic complications: no     Last Vitals:  Vitals:   05/28/18 1035 05/28/18 1045  BP: (!) 136/55 (!) 130/50  Pulse: (!) 57 (!) 51  Resp: 18 18  Temp: 36.7 C   SpO2: 100% 100%    Last Pain:  Vitals:   05/28/18 1045  TempSrc:   PainSc: 0-No pain                 Osric Klopf

## 2018-05-28 NOTE — Anesthesia Preprocedure Evaluation (Addendum)
Anesthesia Evaluation  Patient identified by MRN, date of birth, ID band Patient awake    Reviewed: Allergy & Precautions, H&P , NPO status , Patient's Chart, lab work & pertinent test results, reviewed documented beta blocker date and time   Airway Mallampati: III  TM Distance: >3 FB Neck ROM: full    Dental no notable dental hx. (+) Teeth Intact, Dental Advidsory Given   Pulmonary neg pulmonary ROS,    Pulmonary exam normal breath sounds clear to auscultation       Cardiovascular Exercise Tolerance: Good hypertension, On Medications + angina negative cardio ROS   Rhythm:regular Rate:Normal     Neuro/Psych negative neurological ROS  negative psych ROS   GI/Hepatic negative GI ROS, Neg liver ROS,   Endo/Other  negative endocrine ROS  Renal/GU negative Renal ROS  negative genitourinary   Musculoskeletal   Abdominal   Peds  Hematology negative hematology ROS (+)   Anesthesia Other Findings Breast cancer "unstable angina", which pt denies any h/o heart/CP- but recent episode of uncontrolled BP [id number] Systolic Recent eval with good outcome    Excision of brain "cyst"  Reproductive/Obstetrics negative OB ROS                            Anesthesia Physical Anesthesia Plan  ASA: III  Anesthesia Plan: General   Post-op Pain Management:    Induction:   PONV Risk Score and Plan:   Airway Management Planned:   Additional Equipment:   Intra-op Plan:   Post-operative Plan:   Informed Consent: I have reviewed the patients History and Physical, chart, labs and discussed the procedure including the risks, benefits and alternatives for the proposed anesthesia with the patient or authorized representative who has indicated his/her understanding and acceptance.   Dental Advisory Given  Plan Discussed with: CRNA and Anesthesiologist  Anesthesia Plan Comments:        Anesthesia  Quick Evaluation

## 2018-05-28 NOTE — Anesthesia Procedure Notes (Signed)
Procedure Name: LMA Insertion Date/Time: 05/28/2018 9:27 AM Performed by: Vista Deck, CRNA Pre-anesthesia Checklist: Patient identified, Patient being monitored, Emergency Drugs available, Timeout performed and Suction available Patient Re-evaluated:Patient Re-evaluated prior to induction Oxygen Delivery Method: Circle System Utilized Preoxygenation: Pre-oxygenation with 100% oxygen Induction Type: IV induction Ventilation: Mask ventilation without difficulty LMA: LMA inserted LMA Size: 4.0 Number of attempts: 1 Placement Confirmation: positive ETCO2 and breath sounds checked- equal and bilateral Tube secured with: Tape

## 2018-05-28 NOTE — Transfer of Care (Signed)
Immediate Anesthesia Transfer of Care Note  Patient: Carol Duarte  Procedure(s) Performed: LEFT MODIFIED RADICAL MASTECTOMY (Left Breast)  Patient Location: PACU  Anesthesia Type:General  Level of Consciousness: awake and patient cooperative  Airway & Oxygen Therapy: Patient Spontanous Breathing and Patient connected to nasal cannula oxygen  Post-op Assessment: Report given to RN, Post -op Vital signs reviewed and stable and Patient moving all extremities  Post vital signs: Reviewed and stable  Last Vitals:  Vitals Value Taken Time  BP    Temp    Pulse    Resp    SpO2      Last Pain:  Vitals:   05/28/18 0831  TempSrc: Oral  PainSc:          Complications: No apparent anesthesia complications

## 2018-05-28 NOTE — Progress Notes (Signed)
Incentive Spirometer left in room at sink; patient asleep at this time.

## 2018-05-29 ENCOUNTER — Encounter (HOSPITAL_COMMUNITY): Payer: Self-pay | Admitting: General Surgery

## 2018-05-29 DIAGNOSIS — C773 Secondary and unspecified malignant neoplasm of axilla and upper limb lymph nodes: Secondary | ICD-10-CM | POA: Diagnosis not present

## 2018-05-29 DIAGNOSIS — Z853 Personal history of malignant neoplasm of breast: Secondary | ICD-10-CM | POA: Diagnosis not present

## 2018-05-29 DIAGNOSIS — Z7982 Long term (current) use of aspirin: Secondary | ICD-10-CM | POA: Diagnosis not present

## 2018-05-29 DIAGNOSIS — C50512 Malignant neoplasm of lower-outer quadrant of left female breast: Secondary | ICD-10-CM | POA: Diagnosis not present

## 2018-05-29 DIAGNOSIS — N6092 Unspecified benign mammary dysplasia of left breast: Secondary | ICD-10-CM | POA: Diagnosis not present

## 2018-05-29 DIAGNOSIS — I10 Essential (primary) hypertension: Secondary | ICD-10-CM | POA: Diagnosis not present

## 2018-05-29 DIAGNOSIS — Z9012 Acquired absence of left breast and nipple: Secondary | ICD-10-CM | POA: Diagnosis not present

## 2018-05-29 DIAGNOSIS — Z79899 Other long term (current) drug therapy: Secondary | ICD-10-CM | POA: Diagnosis not present

## 2018-05-29 LAB — CBC
HEMATOCRIT: 33.6 % — AB (ref 36.0–46.0)
Hemoglobin: 10.9 g/dL — ABNORMAL LOW (ref 12.0–15.0)
MCH: 31.3 pg (ref 26.0–34.0)
MCHC: 32.4 g/dL (ref 30.0–36.0)
MCV: 96.6 fL (ref 78.0–100.0)
PLATELETS: 160 10*3/uL (ref 150–400)
RBC: 3.48 MIL/uL — AB (ref 3.87–5.11)
RDW: 13.7 % (ref 11.5–15.5)
WBC: 6 10*3/uL (ref 4.0–10.5)

## 2018-05-29 LAB — BASIC METABOLIC PANEL
ANION GAP: 6 (ref 5–15)
BUN: 23 mg/dL (ref 8–23)
CALCIUM: 8.6 mg/dL — AB (ref 8.9–10.3)
CO2: 24 mmol/L (ref 22–32)
Chloride: 110 mmol/L (ref 98–111)
Creatinine, Ser: 1.02 mg/dL — ABNORMAL HIGH (ref 0.44–1.00)
GFR, EST AFRICAN AMERICAN: 54 mL/min — AB (ref >60)
GFR, EST NON AFRICAN AMERICAN: 47 mL/min — AB (ref >60)
GLUCOSE: 122 mg/dL — AB (ref 70–99)
POTASSIUM: 4.1 mmol/L (ref 3.5–5.1)
Sodium: 140 mmol/L (ref 135–145)

## 2018-05-29 LAB — TYPE AND SCREEN
ABO/RH(D): A POS
Antibody Screen: NEGATIVE

## 2018-05-29 NOTE — Discharge Summary (Signed)
Physician Discharge Summary  Patient ID: Carol Duarte MRN: [id number] DOB/AGE: February 20, 1925 84 y.o.  Admit date: 05/28/2018 Discharge date: 05/29/2018  Admission Diagnoses: Recurrent left breast carcinoma  Discharge Diagnoses: Same Active Problems:   S/P left mastectomy   Discharged Condition: good  Hospital Course: Patient is a 84 year old white female with a history of left breast carcinoma who underwent a left modified radical mastectomy for recurrent left breast carcinoma.  She tolerated the surgery well.  Her postoperative course has been unremarkable.  Her diet was advanced without difficulty.  Final pathology is pending.  Patient is being discharged home on 05/29/2018 in good and improving condition.  Treatments: surgery: Left modified radical mastectomy on 05/28/2018  Discharge Exam: Blood pressure (!) 142/58, pulse (!) 58, temperature 98 F (36.7 C), resp. rate 18, height 5\' 2"  (1.575 m), weight 65.7 kg, SpO2 98 %. General appearance: alert, cooperative and no distress Resp: clear to auscultation bilaterally Breasts: Left mastectomy incision healing well.  JP drainage serosanguineous in nature.  No hematoma present. Cardio: regular rate and rhythm, S1, S2 normal, no murmur, click, rub or gallop  Disposition: Discharge disposition: 01-Home or Self Care       Discharge Instructions    Diet - low sodium heart healthy   Complete by:  As directed    Increase activity slowly   Complete by:  As directed      Allergies as of 05/29/2018   No Known Allergies     Medication List    TAKE these medications   ALEVE 220 MG tablet Generic drug:  naproxen sodium Take 220 mg by mouth daily as needed (for headache or pain).   amLODipine 5 MG tablet Commonly known as:  NORVASC Take 5 mg by mouth daily.   aspirin EC 81 MG tablet Take 1 tablet (81 mg total) by mouth daily.   ferrous sulfate 325 (65 FE) MG tablet Take 325 mg by mouth daily with breakfast.   labetalol 100  MG tablet Commonly known as:  NORMODYNE Take 1 tablet (100 mg total) by mouth 2 (two) times daily.   oxybutynin 5 MG tablet Commonly known as:  DITROPAN Take 5 mg by mouth daily.   simvastatin 40 MG tablet Commonly known as:  ZOCOR Take 40 mg by mouth daily.   traMADol 50 MG tablet Commonly known as:  ULTRAM Take 50 mg by mouth every 12 (twelve) hours as needed.      Follow-up Information    [name], MD. Schedule an appointment as soon as possible for a visit on 06/05/2018.   Specialty:  General Surgery Contact information: [id number] [id number] [phone]           Signed: [name] 05/29/2018, 8:30 AM

## 2018-05-29 NOTE — Addendum Note (Signed)
Addendum  created 05/29/18 1001 by Charmaine Downs, CRNA   Sign clinical note

## 2018-05-29 NOTE — Care Management Note (Signed)
Case Management Note  Patient Details  Name: Candiace West MRN: [id number] Date of Birth: 1925/04/03  Subjective/Objective:      S/p radical mastectomy. Pt needs HH nursing at DC. Pt given options and has no preference. Okay with [facility]. She is aware HH has 48 hrs to make first visit. .              Action/Plan: DC home today with family. Brad, [facility] rep, given referral and will pull pt info from chart.   Expected Discharge Date:  05/29/18               Expected Discharge Plan:  [place]  In-House Referral:  NA  Discharge planning Services  CM Consult  Post Acute Care Choice:  Home Health Choice offered to:  Patient  HH Arranged:  RN [facility] Agency:  [place]  Status of Service:  Completed, signed off  Sherald Barge, RN 05/29/2018, 9:32 AM

## 2018-05-29 NOTE — Anesthesia Postprocedure Evaluation (Signed)
Anesthesia Post Note  Patient: Carol Duarte  Procedure(s) Performed: LEFT MODIFIED RADICAL MASTECTOMY (Left Breast)  Patient location during evaluation: Nursing Unit Anesthesia Type: General Level of consciousness: awake and alert and patient cooperative Pain management: pain level controlled Vital Signs Assessment: post-procedure vital signs reviewed and stable Respiratory status: spontaneous breathing, nonlabored ventilation and respiratory function stable Cardiovascular status: blood pressure returned to baseline Postop Assessment: no apparent nausea or vomiting Anesthetic complications: no     Last Vitals:  Vitals:   05/29/18 0303 05/29/18 0518  BP: (!) 171/55 (!) 142/58  Pulse: (!) 57 (!) 58  Resp: 17 18  Temp: 36.7 C 36.7 C  SpO2: 97% 98%    Last Pain:  Vitals:   05/29/18 0736  TempSrc:   PainSc: 0-No pain                 [name]

## 2018-05-29 NOTE — Discharge Instructions (Signed)
Total or Modified Radical Mastectomy, Care After Refer to this sheet in the next few weeks. These instructions provide you with information about caring for yourself after your procedure. Your health care provider may also give you more specific instructions. Your treatment has been planned according to current medical practices, but problems sometimes occur. Call your health care provider if you have any problems or questions after your procedure. What can I expect after the procedure? After your procedure, it is common to have:  Pain.  Numbness.  Stiffness in your arm or shoulder.  Feelings of stress, sadness, or depression.  If the lymph nodes under your arm were removed, you may have arm swelling, weakness, or numbness on the same side of your body as your surgery. Follow these instructions at home: Incision care  There are many different ways to close and cover an incision, including stitches, skin glue, and adhesive strips. Follow your health care provider's instructions about: ? Incision care. ? Bandage (dressing) changes and removal. ? Incision closure removal.  Check your incision area every day for signs of infection. Watch for: ? Redness, swelling, or pain. ? Fluid, blood, or pus.  If you were sent home with a surgical drain in place, follow your health care provider's instructions for emptying it. Bathing  Do not take baths, swim, or use a hot tub until your health care provider approves.  Take sponge baths until your health care provider says that you can start showering or bathing. Activity  Return to your normal activities as directed by your health care provider.  Avoid strenuous exercise.  Be careful to avoid any activities that could cause an injury to your arm on the side of your surgery.  Do not lift anything that is heavier than 10 lb (4.5 kg). Avoid lifting with the arm that is on the side of your surgery.  Do not carry heavy objects on your  shoulder.  After your drain is removed, you should perform exercises to keep your arm from getting stiff and swollen. Talk with your health care provider about which exercises are safe for you. General instructions  Take medicines only as directed by your health care provider.  You may eat what you usually do.  Keep your arm elevated when at rest.  Do not wear tight jewelry on your arm, wrist, or fingers on the side of your surgery.  Get checked for extra fluid around your lymph nodes (lymphedema) as often as told by your health care provider.  If you had a modified radical mastectomy, always let your health care providers know that lymph nodes under your arm were removed. This is important information to share before you are involved in certain procedures, such as giving blood or having your blood pressure taken. Contact a health care provider if:  You have a fever.  Your pain medicine is not working.  Your arm swelling, weakness, or numbness has not improved after a few weeks.  You have new swelling in your breast or arm.  You have redness, swelling, or pain in your incision area.  You have fluid, blood, or pus coming from your incision. Get help right away if:  You have very bad pain in your breast or arm.  You have chest pain.  You have difficulty breathing. This information is not intended to replace advice given to you by your health care provider. Make sure you discuss any questions you have with your health care provider. Document Released: 04/07/2004 Document Revised: 04/21/2016  Document Reviewed: 04/30/2014 Elsevier Interactive Patient Education  2018 [employer] Surgical drains are used to remove extra fluid that normally builds up in a surgical wound after surgery. A surgical drain helps to heal a surgical wound. Different kinds of surgical drains include:  Active drains. These drains use suction to pull drainage away from the surgical  wound. Drainage flows through a tube to a container outside of the body. It is important to keep the bulb or the drainage container flat (compressed) at all times, except while you empty it. Flattening the bulb or container creates suction. The two most common types of active drains are bulb drains and Hemovac drains.  Passive drains. These drains allow fluid to drain naturally, by gravity. Drainage flows through a tube to a bandage (dressing) or a container outside of the body. Passive drains do not need to be emptied. The most common type of passive drain is the Penrose drain.  A drain is placed during surgery. Immediately after surgery, drainage is usually bright red and a little thicker than water. The drainage may gradually turn yellow or pink and become thinner. It is likely that your health care provider will remove the drain when the drainage stops or when the amount decreases to 1-2 Tbsp (15-30 mL) during a 24-hour period. How to care for your surgical drain  Keep the skin around the drain dry and covered with a dressing at all times.  Check your drain area every day for signs of infection. Check for: ? More redness, swelling, or pain. ? Pus or a bad smell. ? Cloudy drainage. Follow instructions from your health care provider about how to take care of your drain and how to change your dressing. Change your dressing at least one time every day. Change it more often if needed to keep the dressing dry. Make sure you: 1. Gather your supplies, including: ? Tape. ? Germ-free cleaning solution (sterile saline). ? Split gauze drain sponge: 4 x 4 inches (10 x 10 cm). ? Gauze square: 4 x 4 inches (10 x 10 cm). 2. Wash your hands with soap and water before you change your dressing. If soap and water are not available, use hand sanitizer. 3. Remove the old dressing. Avoid using scissors to do that. 4. Use sterile saline to clean your skin around the drain. 5. Place the tube through the slit in a  drain sponge. Place the drain sponge so that it covers your wound. 6. Place the gauze square or another drain sponge on top of the drain sponge that is on the wound. Make sure the tube is between those layers. 7. Tape the dressing to your skin. 8. If you have an active bulb or Hemovac drain, tape the drainage tube to your skin 1-2 inches (2.5-5 cm) below the place where the tube enters your body. Taping keeps the tube from pulling on any stitches (sutures) that you have. 9. Wash your hands with soap and water. 10. Write down the color of your drainage and how often you change your dressing.  How to empty your active bulb or Hemovac drain 1. Make sure that you have a measuring cup that you can empty your drainage into. 2. Wash your hands with soap and water. If soap and water are not available, use hand sanitizer. 3. Gently move your fingers down the tube while squeezing very lightly. This is called stripping the tube. This clears any drainage, clots, or tissue from the tube. ?  Do not pull on the tube. ? You may need to strip the tube several times every day to keep the tube clear. 4. Open the bulb cap or the drain plug. Do not touch the inside of the cap or the bottom of the plug. 5. Empty all of the drainage into the measuring cup. 6. Compress the bulb or the container and replace the cap or the plug. To compress the bulb or the container, squeeze it firmly in the middle while you close the cap or plug the container. 7. Write down the amount of drainage that you have in each 24-hour period. If you have less than 2 Tbsp (30 mL) of drainage during 24 hours, contact your health care provider. 8. Flush the drainage down the toilet. 9. Wash your hands with soap and water. Contact a health care provider if:  You have more redness, swelling, or pain around your drain area.  The amount of drainage that you have is increasing instead of decreasing.  You have pus or a bad smell coming from your drain  area.  You have a fever.  You have drainage that is cloudy.  There is a sudden stop or a sudden decrease in the amount of drainage that you have.  Your tube falls out.  Your active draindoes not stay compressedafter you empty it. This information is not intended to replace advice given to you by your health care provider. Make sure you discuss any questions you have with your health care provider. Document Released: 08/12/2000 Document Revised: 01/21/2016 Document Reviewed: 03/04/2015 Elsevier Interactive Patient Education  2018 [employer].

## 2018-05-29 NOTE — Progress Notes (Signed)
Patient states understanding of discharge instructions.  

## 2018-05-30 DIAGNOSIS — Z483 Aftercare following surgery for neoplasm: Secondary | ICD-10-CM | POA: Diagnosis not present

## 2018-05-30 DIAGNOSIS — Z9012 Acquired absence of left breast and nipple: Secondary | ICD-10-CM | POA: Diagnosis not present

## 2018-05-30 DIAGNOSIS — D0512 Intraductal carcinoma in situ of left breast: Secondary | ICD-10-CM | POA: Diagnosis not present

## 2018-05-30 DIAGNOSIS — Z853 Personal history of malignant neoplasm of breast: Secondary | ICD-10-CM | POA: Diagnosis not present

## 2018-05-31 DIAGNOSIS — Z483 Aftercare following surgery for neoplasm: Secondary | ICD-10-CM | POA: Diagnosis not present

## 2018-06-01 DIAGNOSIS — Z483 Aftercare following surgery for neoplasm: Secondary | ICD-10-CM | POA: Diagnosis not present

## 2018-06-01 DIAGNOSIS — Z853 Personal history of malignant neoplasm of breast: Secondary | ICD-10-CM | POA: Diagnosis not present

## 2018-06-01 DIAGNOSIS — D0512 Intraductal carcinoma in situ of left breast: Secondary | ICD-10-CM | POA: Diagnosis not present

## 2018-06-01 DIAGNOSIS — Z9012 Acquired absence of left breast and nipple: Secondary | ICD-10-CM | POA: Diagnosis not present

## 2018-06-05 ENCOUNTER — Encounter: Payer: Self-pay | Admitting: General Surgery

## 2018-06-05 ENCOUNTER — Ambulatory Visit (INDEPENDENT_AMBULATORY_CARE_PROVIDER_SITE_OTHER): Payer: Self-pay | Admitting: General Surgery

## 2018-06-05 VITALS — BP 153/87 | HR 52 | Temp 96.0°F | Resp 20 | Wt 131.0 lb

## 2018-06-05 DIAGNOSIS — Z09 Encounter for follow-up examination after completed treatment for conditions other than malignant neoplasm: Secondary | ICD-10-CM

## 2018-06-05 NOTE — Progress Notes (Signed)
Subjective:     Carol Duarte  Status post left modified radical mastectomy.  Doing very well.  Has no complaints.  Still having serous drainage from JP drain. Objective:    BP (!) 153/87 (BP Location: Left Arm, Patient Position: Sitting, Cuff Size: Normal)   Pulse (!) 52   Temp (!) 96 F (35.6 C) (Temporal)   Resp 20   Wt 131 lb (59.4 kg)   BMI 23.96 kg/m   General:  alert, cooperative and no distress  Left mastectomy incision healing well.  One half of staples removed.  JP drainage with serous drainage present.  Left in place. Final pathology shows total removal of the left breast cancer.  3 out of 3 lymph nodes were positive for metastatic disease.  Patient is aware.     Assessment:    Doing well postoperatively.    Plan:   Return next week for wound check.

## 2018-06-08 DIAGNOSIS — D0512 Intraductal carcinoma in situ of left breast: Secondary | ICD-10-CM | POA: Diagnosis not present

## 2018-06-08 DIAGNOSIS — Z9012 Acquired absence of left breast and nipple: Secondary | ICD-10-CM | POA: Diagnosis not present

## 2018-06-08 DIAGNOSIS — Z483 Aftercare following surgery for neoplasm: Secondary | ICD-10-CM | POA: Diagnosis not present

## 2018-06-08 DIAGNOSIS — Z853 Personal history of malignant neoplasm of breast: Secondary | ICD-10-CM | POA: Diagnosis not present

## 2018-06-12 ENCOUNTER — Encounter: Payer: Self-pay | Admitting: General Surgery

## 2018-06-12 ENCOUNTER — Ambulatory Visit (INDEPENDENT_AMBULATORY_CARE_PROVIDER_SITE_OTHER): Payer: Self-pay | Admitting: General Surgery

## 2018-06-12 VITALS — BP 153/63 | HR 50 | Temp 96.2°F | Resp 18 | Wt 136.2 lb

## 2018-06-12 DIAGNOSIS — Z853 Personal history of malignant neoplasm of breast: Secondary | ICD-10-CM | POA: Diagnosis not present

## 2018-06-12 DIAGNOSIS — Z483 Aftercare following surgery for neoplasm: Secondary | ICD-10-CM | POA: Diagnosis not present

## 2018-06-12 DIAGNOSIS — Z09 Encounter for follow-up examination after completed treatment for conditions other than malignant neoplasm: Secondary | ICD-10-CM

## 2018-06-12 DIAGNOSIS — D0512 Intraductal carcinoma in situ of left breast: Secondary | ICD-10-CM | POA: Diagnosis not present

## 2018-06-12 DIAGNOSIS — Z9012 Acquired absence of left breast and nipple: Secondary | ICD-10-CM | POA: Diagnosis not present

## 2018-06-12 NOTE — Progress Notes (Signed)
Subjective:     Carol Duarte  Here for follow-up status post left modified radical mastectomy.  Doing well.  Has no incisional pain.  Drainage in JP drain decreased. Objective:    BP (!) 153/63   Pulse (!) 50   Temp (!) 96.2 F (35.7 C) (Temporal)   Resp 18   Wt 136 lb 3.2 oz (61.8 kg)   BMI 24.91 kg/m   General:  alert, cooperative and no distress  Left mastectomy incision well-healed.  Staples removed.  JP drain removed.     Assessment:    Doing well postoperatively.    Plan:   Follow-up here in 2 weeks for wound check.

## 2018-06-19 DIAGNOSIS — Z803 Family history of malignant neoplasm of breast: Secondary | ICD-10-CM | POA: Diagnosis not present

## 2018-06-19 DIAGNOSIS — D0512 Intraductal carcinoma in situ of left breast: Secondary | ICD-10-CM | POA: Diagnosis not present

## 2018-06-19 DIAGNOSIS — Z483 Aftercare following surgery for neoplasm: Secondary | ICD-10-CM | POA: Diagnosis not present

## 2018-06-19 DIAGNOSIS — Z9012 Acquired absence of left breast and nipple: Secondary | ICD-10-CM | POA: Diagnosis not present

## 2018-06-19 DIAGNOSIS — Z853 Personal history of malignant neoplasm of breast: Secondary | ICD-10-CM | POA: Diagnosis not present

## 2018-06-19 DIAGNOSIS — E785 Hyperlipidemia, unspecified: Secondary | ICD-10-CM | POA: Diagnosis not present

## 2018-06-19 DIAGNOSIS — I10 Essential (primary) hypertension: Secondary | ICD-10-CM | POA: Diagnosis not present

## 2018-06-19 DIAGNOSIS — D500 Iron deficiency anemia secondary to blood loss (chronic): Secondary | ICD-10-CM | POA: Diagnosis not present

## 2018-06-19 DIAGNOSIS — Z23 Encounter for immunization: Secondary | ICD-10-CM | POA: Diagnosis not present

## 2018-06-26 ENCOUNTER — Encounter: Payer: Self-pay | Admitting: General Surgery

## 2018-06-26 ENCOUNTER — Ambulatory Visit (INDEPENDENT_AMBULATORY_CARE_PROVIDER_SITE_OTHER): Payer: Self-pay | Admitting: General Surgery

## 2018-06-26 VITALS — BP 149/90 | HR 51 | Temp 96.4°F | Resp 16 | Wt 138.4 lb

## 2018-06-26 DIAGNOSIS — Z09 Encounter for follow-up examination after completed treatment for conditions other than malignant neoplasm: Secondary | ICD-10-CM

## 2018-06-26 NOTE — Progress Notes (Signed)
Subjective:     Carol Duarte  Patient here for wound check.  She is doing well.  She has no complaints. Objective:    BP (!) 149/90 (BP Location: Right Arm, Patient Position: Sitting, Cuff Size: Normal)   Pulse (!) 51   Temp (!) 96.4 F (35.8 C) (Temporal)   Resp 16   Wt 138 lb 6.4 oz (62.8 kg)   BMI 25.31 kg/m   General:  alert, cooperative and no distress  Left mastectomy site healing well.  No hematoma or drainage noted.     Assessment:    Doing well postoperatively.    Plan:   Will refer back to oncology for further evaluation and treatment.  Follow-up here as needed.

## 2018-06-29 ENCOUNTER — Ambulatory Visit (HOSPITAL_COMMUNITY): Payer: Medicare Other | Admitting: Hematology

## 2018-06-29 DIAGNOSIS — C50112 Malignant neoplasm of central portion of left female breast: Secondary | ICD-10-CM | POA: Diagnosis not present

## 2018-07-03 ENCOUNTER — Inpatient Hospital Stay (HOSPITAL_COMMUNITY): Payer: Medicare Other | Attending: Hematology | Admitting: Hematology

## 2018-07-03 ENCOUNTER — Encounter (HOSPITAL_COMMUNITY): Payer: Self-pay | Admitting: Hematology

## 2018-07-03 VITALS — BP 165/64 | HR 59 | Temp 97.4°F | Resp 18 | Wt 138.2 lb

## 2018-07-03 DIAGNOSIS — C50912 Malignant neoplasm of unspecified site of left female breast: Secondary | ICD-10-CM | POA: Diagnosis not present

## 2018-07-03 DIAGNOSIS — Z7982 Long term (current) use of aspirin: Secondary | ICD-10-CM | POA: Diagnosis not present

## 2018-07-03 DIAGNOSIS — I10 Essential (primary) hypertension: Secondary | ICD-10-CM

## 2018-07-03 DIAGNOSIS — Z171 Estrogen receptor negative status [ER-]: Secondary | ICD-10-CM | POA: Diagnosis not present

## 2018-07-03 DIAGNOSIS — Z79899 Other long term (current) drug therapy: Secondary | ICD-10-CM | POA: Diagnosis not present

## 2018-07-03 DIAGNOSIS — M549 Dorsalgia, unspecified: Secondary | ICD-10-CM | POA: Diagnosis not present

## 2018-07-03 NOTE — Assessment & Plan Note (Signed)
1.  Recurrent left breast cancer: - Initial biopsy of the left breast lesion on 03/14/2017, ER 20% positive, PR negative, HER-2 negative by FISH, Ki-67 20%, status post lumpectomy on 04/24/2017 with pathology showing 4.2 cm invasive ductal carcinoma, grade 3, invasive carcinoma focally 0.1 cm from the posterior and anterior margins.  No lymph node sampling was done. - In July 2019, she noticed a lump at her prior lumpectomy site, which was painful.  She did not want to have mammogram done. -Biopsy of this lump on 03/28/2018 by Dr. Arnoldo Morale showed invasive ductal carcinoma with calcifications (2.2 cm), ER negative, PR negative, Ki-67 40%, HER-2 3+ positive by IHC. - She underwent left modified radical mastectomy on 05/28/2018. -We discussed the pathology report in detail.  There was metastatic breast cancer to 3 out of 3 lymph nodes.  No evidence of residual carcinoma in the breast parenchyma. -She reported having back pain for the last 10 days when she was trying to lift a mattress.  As she has metastatic disease to the 3 lymph nodes, and the tumor is aggressive with the ER PR negative and HER-2 positive, I recommended metastatic work-up.  We will order a PET CT scan.  We will see her back after the PET CT scan.

## 2018-07-03 NOTE — Patient Instructions (Signed)
[facility] at [facility] Discharge Instructions     Thank you for choosing [facility] at [facility] to provide your oncology and hematology care.  To afford each patient quality time with our provider, please arrive at least 15 minutes before your scheduled appointment time.   If you have a lab appointment with the Cancer Center please come in thru the  Main Entrance and check in at the main information desk  You need to re-schedule your appointment should you arrive 10 or more minutes late.  We strive to give you quality time with our providers, and arriving late affects you and other patients whose appointments are after yours.  Also, if you no show three or more times for appointments you may be dismissed from the clinic at the providers discretion.     Again, thank you for choosing [facility].  Our hope is that these requests will decrease the amount of time that you wait before being seen by our physicians.       _____________________________________________________________  Should you have questions after your visit to [facility], please contact our office at [phone] between the hours of 8:00 a.m. and 4:30 p.m.  Voicemails left after 4:00 p.m. will not be returned until the following business day.  For prescription refill requests, have your pharmacy contact our office and allow 72 hours.    Cancer Center Support Programs:   > Cancer Support Group  2nd Tuesday of the month 1pm-2pm, Journey Room    

## 2018-07-03 NOTE — Progress Notes (Signed)
[place], [place] [address]   CLINIC:  Medical Oncology/Hematology  PCP:  [name], [place] [address] [phone]   REASON FOR VISIT: Follow-up for recurrent left breast carcinoma, ER-/PR-/HER2+  CURRENT THERAPY: S/P mastectomy   INTERVAL HISTORY:  Carol Duarte 83 y.o. female returns for routine follow-up for recurrent left breast carcinoma. Patient is feeling well after surgery. She states she had no pain with her surgery and she is recovering well. She does have occasional diarrhea which she take imodium and it resolves it. She lives at home alone and performs all her own ADLs and activities. She still goes dancing on Friday nights and does housework like vacuuming and dusting. She is very active at home. She does repeat stories that she forgets she already told. She denies any nausea, vomiting, or constipation. Denies any new pains or lumps felt. Denies any bleeding.     REVIEW OF SYSTEMS:  Review of Systems  Gastrointestinal: Positive for diarrhea.  All other systems reviewed and are negative.    PAST MEDICAL/SURGICAL HISTORY:  Past Medical History:  Diagnosis Date  . Cancer ([place])    left breast  . History of kidney stones   . Hypercholesteremia   . Hypertension    Past Surgical History:  Procedure Laterality Date  . APPENDECTOMY    . brain cyst removed  2011  . BREAST BIOPSY Left 03/28/2018   Procedure: BREAST BIOPSY;  Surgeon: [name], MD;  Location: [facility];  Service: General;  Laterality: Left;  . CATARACT EXTRACTION W/PHACO Left 11/09/2015   Procedure: CATARACT EXTRACTION PHACO AND INTRAOCULAR LENS PLACEMENT LEFT EYE cde=8.97;  Surgeon: [name], MD;  Location: [facility];  Service: Ophthalmology;  Laterality: Left;  . EYE SURGERY     KPE left  . MASTECTOMY MODIFIED RADICAL Left 05/28/2018   Procedure: LEFT MODIFIED RADICAL MASTECTOMY;  Surgeon: [name], MD;  Location: [facility];   Service: General;  Laterality: Left;  Marland Kitchen MASTECTOMY, PARTIAL Left 04/24/2017   Procedure: MASTECTOMY PARTIAL;  Surgeon: [name], MD;  Location: [facility];  Service: General;  Laterality: Left;  . TONSILLECTOMY    . TONSILLECTOMY AND ADENOIDECTOMY       SOCIAL HISTORY:  Social History   Socioeconomic History  . Marital status: Widowed    Spouse name: Not on file  . Number of children: Not on file  . Years of education: Not on file  . Highest education level: Not on file  Occupational History  . Not on file  Social Needs  . Financial resource strain: Not hard at all  . Food insecurity:    Worry: Never true    Inability: Never true  . Transportation needs:    Medical: No    Non-medical: No  Tobacco Use  . Smoking status: Never Smoker  . Smokeless tobacco: Never Used  Substance and Sexual Activity  . Alcohol use: No  . Drug use: No  . Sexual activity: Not Currently    Partners: Male  Lifestyle  . Physical activity:    Days per week: Patient refused    Minutes per session: Patient refused  . Stress: Not at all  Relationships  . Social connections:    Talks on phone: Patient refused    Gets together: Patient refused    Attends religious service: Patient refused    Active member of club or organization: Patient refused    Attends meetings of clubs or organizations:  Patient refused    Relationship status: Patient refused  . Intimate partner violence:    Fear of current or ex partner: Patient refused    Emotionally abused: Patient refused    Physically abused: Patient refused    Forced sexual activity: Patient refused  Other Topics Concern  . Not on file  Social History Narrative  . Not on file    FAMILY HISTORY:  Family History  Problem Relation Age of Onset  . Heart failure Mother   . Heart failure Father     CURRENT MEDICATIONS:  Outpatient Encounter Medications as of 07/03/2018  Medication Sig  . amLODipine (NORVASC) 5 MG tablet Take 5 mg by mouth  daily.  Marland Kitchen aspirin EC 81 MG tablet Take 1 tablet (81 mg total) by mouth daily.  . ferrous sulfate 325 (65 FE) MG tablet Take 325 mg by mouth daily with breakfast.  . labetalol (NORMODYNE) 100 MG tablet Take 1 tablet (100 mg total) by mouth 2 (two) times daily.  . naproxen sodium (ALEVE) 220 MG tablet Take 220 mg by mouth daily as needed (for headache or pain).  Marland Kitchen oxybutynin (DITROPAN) 5 MG tablet Take 5 mg by mouth daily.   . simvastatin (ZOCOR) 40 MG tablet Take 40 mg by mouth daily.  . traMADol (ULTRAM) 50 MG tablet Take 50 mg by mouth every 12 (twelve) hours as needed.   No facility-administered encounter medications on file as of 07/03/2018.     ALLERGIES:  No Known Allergies   PHYSICAL EXAM:  ECOG Performance status: 1  Vitals:   07/03/18 0812  BP: (!) 165/64  Pulse: (!) 59  Resp: 18  Temp: (!) 97.4 F (36.3 C)  SpO2: 97%   Filed Weights   07/03/18 0812  Weight: 138 lb 3.2 oz (62.7 kg)    Physical Exam  Constitutional: She is oriented to person, place, and time. She appears well-developed and well-nourished.  Musculoskeletal: Normal range of motion.  Neurological: She is alert and oriented to person, place, and time.  Skin: Skin is warm and dry.  Psychiatric: She has a normal mood and affect. Her behavior is normal. Judgment and thought content normal.  Breast: RIGHT:No palpable masses, no skin changes or nipple discharge, no adenopathy.              LEFT: mastectomy site healed, no redness or swelling noted, No palpable mass  LABORATORY DATA:  I have reviewed the labs as listed.  CBC    Component Value Date/Time   WBC 6.0 05/29/2018 0440   RBC 3.48 (L) 05/29/2018 0440   HGB 10.9 (L) 05/29/2018 0440   HCT 33.6 (L) 05/29/2018 0440   PLT 160 05/29/2018 0440   MCV 96.6 05/29/2018 0440   MCH 31.3 05/29/2018 0440   MCHC 32.4 05/29/2018 0440   RDW 13.7 05/29/2018 0440   LYMPHSABS 1.1 05/23/2018 1334   MONOABS 0.3 05/23/2018 1334   EOSABS 0.2 05/23/2018 1334    BASOSABS 0.0 05/23/2018 1334   CMP Latest Ref Rng & Units 05/29/2018 05/23/2018 03/23/2018  Glucose 70 - 99 mg/dL 122(H) 121(H) 149(H)  BUN 8 - 23 mg/dL 23 31(H) 27(H)  Creatinine 0.44 - 1.00 mg/dL 1.02(H) 1.24(H) 1.23(H)  Sodium 135 - 145 mmol/L 140 138 142  Potassium 3.5 - 5.1 mmol/L 4.1 4.7 4.5  Chloride 98 - 111 mmol/L 110 108 111  CO2 22 - 32 mmol/L _0 Calcium 8.9 - 10.3 mg/dL 8.6(L) 8.8(L) 9.2  Total Protein 6.5 - 8.1  g/dL - - 6.1(L)  Total Bilirubin 0.3 - 1.2 mg/dL - - 0.7  Alkaline Phos 38 - 126 U/L - - 46  AST 15 - 41 U/L - - 15  ALT 0 - 44 U/L - - 17       DIAGNOSTIC IMAGING:  I have reviewed her mammograms.     ASSESSMENT & PLAN:   Invasive ductal carcinoma of left breast ([place]) 1.  Recurrent left breast cancer: - Initial biopsy of the left breast lesion on 03/14/2017, ER 20% positive, PR negative, HER-2 negative by FISH, Ki-67 20%, status post lumpectomy on 04/24/2017 with pathology showing 4.2 cm invasive ductal carcinoma, grade 3, invasive carcinoma focally 0.1 cm from the posterior and anterior margins.  No lymph node sampling was done. - In July 2019, she noticed a lump at her prior lumpectomy site, which was painful.  She did not want to have mammogram done. -Biopsy of this lump on 03/28/2018 by Dr. Arnoldo Morale showed invasive ductal carcinoma with calcifications (2.2 cm), ER negative, PR negative, Ki-67 40%, HER-2 3+ positive by IHC. - She underwent left modified radical mastectomy on 05/28/2018. -We discussed the pathology report in detail.  There was metastatic breast cancer to 3 out of 3 lymph nodes.  No evidence of residual carcinoma in the breast parenchyma. -She reported having back pain for the last 10 days when she was trying to lift a mattress.  As she has metastatic disease to the 3 lymph nodes, and the tumor is aggressive with the ER PR negative and HER-2 positive, I recommended metastatic work-up.  We will order a PET CT scan.  We will see her back after  the PET CT scan.  Total time spent is 40 minutes with more than 50% of the time spent face-to-face discussing pathology report, new diagnosis, further work-up and coordination of care.    Orders placed this encounter:  Orders Placed This Encounter  Procedures  . NM PET Image Initial (PI) Skull Base To Thigh      Derek Jack, MD [place] [phone]

## 2018-07-09 ENCOUNTER — Encounter (HOSPITAL_COMMUNITY)
Admission: RE | Admit: 2018-07-09 | Discharge: 2018-07-09 | Disposition: A | Discharge status: Home / Self Care | Payer: Medicare Other | Source: Ambulatory Visit | Attending: Nurse Practitioner | Admitting: Nurse Practitioner

## 2018-07-09 ENCOUNTER — Encounter (HOSPITAL_COMMUNITY): Payer: Medicare Other

## 2018-07-09 DIAGNOSIS — C50912 Malignant neoplasm of unspecified site of left female breast: Secondary | ICD-10-CM | POA: Insufficient documentation

## 2018-07-11 ENCOUNTER — Ambulatory Visit (HOSPITAL_COMMUNITY): Payer: Medicare Other | Admitting: Hematology

## 2018-07-12 ENCOUNTER — Telehealth (HOSPITAL_COMMUNITY): Payer: Self-pay | Admitting: *Deleted

## 2018-07-12 ENCOUNTER — Other Ambulatory Visit (HOSPITAL_COMMUNITY): Payer: Self-pay | Admitting: *Deleted

## 2018-07-12 MED ORDER — LORAZEPAM 1 MG PO TABS: 1.0000 mg | ORAL_TABLET | Freq: Three times a day (TID) | ORAL | 0 refills | Status: DC

## 2018-07-19 NOTE — Telephone Encounter (Signed)
Medication sent to pts pharmacy 

## 2018-07-23 ENCOUNTER — Ambulatory Visit (HOSPITAL_COMMUNITY)
Admission: RE | Admit: 2018-07-23 | Discharge: 2018-07-23 | Disposition: A | Discharge status: Home / Self Care | Payer: Medicare Other | Source: Ambulatory Visit | Attending: Nurse Practitioner | Admitting: Nurse Practitioner

## 2018-07-23 DIAGNOSIS — C50912 Malignant neoplasm of unspecified site of left female breast: Secondary | ICD-10-CM | POA: Diagnosis not present

## 2018-07-23 DIAGNOSIS — R590 Localized enlarged lymph nodes: Secondary | ICD-10-CM | POA: Insufficient documentation

## 2018-07-23 DIAGNOSIS — I2510 Atherosclerotic heart disease of native coronary artery without angina pectoris: Secondary | ICD-10-CM | POA: Insufficient documentation

## 2018-07-23 DIAGNOSIS — K7689 Other specified diseases of liver: Secondary | ICD-10-CM | POA: Diagnosis not present

## 2018-07-23 DIAGNOSIS — I700 Atherosclerosis of aorta: Secondary | ICD-10-CM | POA: Diagnosis not present

## 2018-07-23 DIAGNOSIS — R918 Other nonspecific abnormal finding of lung field: Secondary | ICD-10-CM | POA: Insufficient documentation

## 2018-07-23 DIAGNOSIS — K5730 Diverticulosis of large intestine without perforation or abscess without bleeding: Secondary | ICD-10-CM | POA: Insufficient documentation

## 2018-07-23 DIAGNOSIS — Z9012 Acquired absence of left breast and nipple: Secondary | ICD-10-CM | POA: Diagnosis not present

## 2018-07-23 DIAGNOSIS — K449 Diaphragmatic hernia without obstruction or gangrene: Secondary | ICD-10-CM | POA: Diagnosis not present

## 2018-07-23 MED ORDER — FLUDEOXYGLUCOSE F - 18 (FDG) INJECTION
10.2500 | Freq: Once | INTRAVENOUS | Status: AC | PRN
  Administered 2018-07-23: 10.25 via INTRAVENOUS

## 2018-07-25 ENCOUNTER — Inpatient Hospital Stay (HOSPITAL_COMMUNITY): Payer: Medicare Other

## 2018-07-25 ENCOUNTER — Other Ambulatory Visit: Payer: Self-pay

## 2018-07-25 ENCOUNTER — Inpatient Hospital Stay (HOSPITAL_COMMUNITY): Payer: Medicare Other | Admitting: Hematology

## 2018-07-25 ENCOUNTER — Encounter (HOSPITAL_COMMUNITY): Payer: Self-pay | Admitting: Hematology

## 2018-07-25 VITALS — BP 157/49 | HR 59 | Temp 98.3°F | Resp 16 | Wt 143.6 lb

## 2018-07-25 DIAGNOSIS — I10 Essential (primary) hypertension: Secondary | ICD-10-CM

## 2018-07-25 DIAGNOSIS — M549 Dorsalgia, unspecified: Secondary | ICD-10-CM | POA: Diagnosis not present

## 2018-07-25 DIAGNOSIS — C50912 Malignant neoplasm of unspecified site of left female breast: Secondary | ICD-10-CM

## 2018-07-25 DIAGNOSIS — Z171 Estrogen receptor negative status [ER-]: Secondary | ICD-10-CM

## 2018-07-25 DIAGNOSIS — Z79899 Other long term (current) drug therapy: Secondary | ICD-10-CM

## 2018-07-25 DIAGNOSIS — Z7982 Long term (current) use of aspirin: Secondary | ICD-10-CM

## 2018-07-25 LAB — COMPREHENSIVE METABOLIC PANEL
ALBUMIN: 3.9 g/dL (ref 3.5–5.0)
ALT: 15 U/L (ref 0–44)
AST: 16 U/L (ref 15–41)
Alkaline Phosphatase: 45 U/L (ref 38–126)
Anion gap: 7 (ref 5–15)
BILIRUBIN TOTAL: 0.7 mg/dL (ref 0.3–1.2)
BUN: 28 mg/dL — AB (ref 8–23)
CO2: 23 mmol/L (ref 22–32)
Calcium: 8.8 mg/dL — ABNORMAL LOW (ref 8.9–10.3)
Chloride: 110 mmol/L (ref 98–111)
Creatinine, Ser: 1.23 mg/dL — ABNORMAL HIGH (ref 0.44–1.00)
GFR calc Af Amer: 44 mL/min — ABNORMAL LOW (ref >60)
GFR, EST NON AFRICAN AMERICAN: 38 mL/min — AB (ref >60)
GLUCOSE: 122 mg/dL — AB (ref 70–99)
Potassium: 4.6 mmol/L (ref 3.5–5.1)
Sodium: 140 mmol/L (ref 135–145)
TOTAL PROTEIN: 6.3 g/dL — AB (ref 6.5–8.1)

## 2018-07-25 LAB — CBC WITH DIFFERENTIAL/PLATELET
Abs Immature Granulocytes: 0.02 10*3/uL (ref 0.00–0.07)
BASOS ABS: 0 10*3/uL (ref 0.0–0.1)
Basophils Relative: 1 %
EOS ABS: 0.4 10*3/uL (ref 0.0–0.5)
EOS PCT: 7 %
HEMATOCRIT: 36.3 % (ref 36.0–46.0)
HEMOGLOBIN: 11.2 g/dL — AB (ref 12.0–15.0)
IMMATURE GRANULOCYTES: 0 %
LYMPHS PCT: 17 %
Lymphs Abs: 0.9 10*3/uL (ref 0.7–4.0)
MCH: 30.4 pg (ref 26.0–34.0)
MCHC: 30.9 g/dL (ref 30.0–36.0)
MCV: 98.6 fL (ref 80.0–100.0)
MONOS PCT: 9 %
Monocytes Absolute: 0.5 10*3/uL (ref 0.1–1.0)
NRBC: 0 % (ref 0.0–0.2)
Neutro Abs: 3.5 10*3/uL (ref 1.7–7.7)
Neutrophils Relative %: 66 %
Platelets: 193 10*3/uL (ref 150–400)
RBC: 3.68 MIL/uL — ABNORMAL LOW (ref 3.87–5.11)
RDW: 13.1 % (ref 11.5–15.5)
WBC: 5.3 10*3/uL (ref 4.0–10.5)

## 2018-07-25 MED ORDER — LIDOCAINE-PRILOCAINE 2.5-2.5 % EX CREA: TOPICAL_CREAM | CUTANEOUS | 2 refills | Status: DC

## 2018-07-25 NOTE — Progress Notes (Signed)
Herceptin education packet pulled together.

## 2018-07-25 NOTE — Patient Instructions (Addendum)
[facility] Chemotherapy Teaching   You have been diagnosed with metastatic breast cancer (HER 2 positive, Hormone Receptor negative).  We are going to treat you with palliative intent. This means that your cancer is not curable but it is treatable.  We are going to be treating you with Herceptin (trastuzumab) every 21 days through your port a cath.  You will see the doctor regularly throughout treatment. We monitor your lab work prior to every treatment. The doctor monitors your response to treatment by the way you are feeling, your blood work, and scans periodically. There will be wait times while you are here for treatment. It will take about 30 minutes to 1 hour for your lab work to result. Then there will be wait times while pharmacy mixes your medications.   You will take the following premedications prior to each treatment: Tylenol - given to help reduce the risk of allergic reaction to infusion Bendadryl - given to help reduce the risk of allergic reaction to infusion  Trastuzumab-xxxx (Herceptin, KanjintiT)  About This Drug Trastuzumab-xxxx is used to treat cancer. It is given in the vein (IV)  Possible Side Effects . Bone marrow suppression. This is a decrease in the number of white blood cells, red blood cells, and platelets. This may raise your risk of infection, make you tired and weak (fatigue), and raise your risk of bleeding. . Congestive heart failure - your heart has less ability to pump blood properly . Soreness of the mouth and throat. You may have red areas, white patches, or sores that hurt . Nausea . Diarrhea (loose bowel movements) . Fever . Chills . Tiredness . Infection . Inflammation of nasal passages and throat . Changes in the way food and drinks taste . Weight loss . Headache . Trouble sleeping . Cough . Upper respiratory infection . Rash  Note: Each of the side effects above was reported in 10% or greater of patients treated  with trastuzumab-xxxx. Not all possible side effects are included above.  Warnings and Precautions . Changes in the tissue of the heart and heart function. Some changes may happen that can cause your heart to have less ability to pump blood. This drug may also increase your risk of heart attack. . Serious and life-threatening lung problems such as inflammation (swelling) and scarring of the lungs which makes breathing difficult. . While you are getting this drug in your vein (IV), you may have a reaction to the drug. Sometimes you may be given medication to stop or lessen these side effects. Your nurse will check you closely for these signs: fever or shaking chills, flushing, facial swelling, feeling dizzy, headache, trouble breathing, rash, itching, chest tightness, or chest pain. These reactions may happen after your infusion. If this happens, call 911 for emergency care. . Severe decrease in the number of white blood cells. This may raise your risk of infection which may be life-threatening.  Note: Some of the side effects above are very rare. If you have concerns and/or questions, please discuss them with your medical team.  Important Information . This drug may be present in the saliva, tears, sweat, urine, stool, vomit, semen, and vaginal secretions. Talk to your doctor and/or your nurse about the necessary precautions to take during this time.  Treating Side Effects . Manage tiredness by pacing your activities for the day. . Be sure to include periods of rest between energy-draining activities. . To decrease the risk of infection, wash your hands regularly. Marland Kitchen  Avoid close contact with people who have a cold, the flu, or other infections. . Take your temperature as your doctor or nurse tells you, and whenever you feel like you may have a fever. . To help decrease the risk of bleeding, use a soft toothbrush. Check with your nurse before using dental floss. . Be very careful when  using knives or tools. . Use an electric shaver instead of a razor. . Drink plenty of fluids (a minimum of eight glasses per day is recommended). . To help with nausea, eat small, frequent meals instead of three large meals a day. Choose foods and drinks that are at room temperature. Ask your nurse or doctor about other helpful tips and medicine that is available to help stop or lessen these symptoms. . Mouth care is very important. Your mouth care should consist of routine, gentle cleaning of your teeth or dentures and rinsing your mouth with a mixture of 1/2 teaspoon of salt in 8 ounces of water or 1/2 teaspoon of baking soda in 8 ounces of water. This should be done at least after each meal and at bedtime. . If you have mouth sores, avoid mouthwash that has alcohol. Also avoid alcohol and smoking because they can bother your mouth and throat. . If you throw up or have loose bowel movements, you should drink more fluids so that you do not become dehydrated (lack of water in the body from losing too much fluid). . If you have diarrhea, eat low-fiber foods that are high in protein and calories and avoid foods that can irritate your digestive tracts or lead to cramping. . Ask your nurse or doctor about medicine that can lessen or stop your diarrhea. . To help with weight loss, drink fluids that contribute calories (whole milk, juice, soft drinks, sweetened beverages, milkshakes, and nutritional supplements) instead of water. . Include a source of protein at every meal and snack, such as meat, poultry, fish, dry beans, tofu, eggs, nuts, milk, yogurt, cheese, ice cream, pudding, and nutritional supplements. . If you get a rash do not put anything on it unless your doctor or nurse says you may. Keep the area around the rash clean and dry. Ask your doctor for medicine if your rash bothers you. Marland Kitchen Keeping your pain under control is important to your well-being. Please tell your doctor or nurse  if you are experiencing pain. . If you are having trouble sleeping, talk to your nurse or doctor on tips to help you sleep better . Infusion reactions may occur after your infusion. If this happens, call 911 for emergency care.  Food and Drug Interactions . There are no known interactions of trastuzumab-xxxx with food. . This drug may interact with other medicines. Tell your doctor and pharmacist about all the prescription and over-the-counter medicines and dietary supplements (vitamins, minerals, herbs and others) that you are taking at this time. Also, check with your doctor or pharmacist before starting any new prescription or over-the-counter medicines, or dietary supplements to make sure that there are no interactions.  When to Call the Doctor Call your doctor or nurse if you have any of these symptoms and/or any new or unusual symptoms: . Fever of 100.4 F (38 C) or higher . Chills . Tiredness that interferes with your daily activities . Trouble falling or staying asleep . Feeling dizzy or lightheaded . A headache that does not go away . Easy bleeding or bruising . Wheezing or trouble breathing or dry cough . Coughing  up yellow, green, or bloody mucus . Feeling that your heart is beating in a fast or not normal way (palpitations) . Chest pain or symptoms of a heart attack. Most heart attacks involve pain in the center of the chest that lasts more than a few minutes. The pain may go away and come back, or it can be constant. It can feel like pressure, squeezing, fullness, or pain. Sometimes pain is felt in one or both arms, the back, neck, jaw, or stomach. If any of these symptoms last 2 minutes, call 911. . Pain in your mouth or throat that makes it hard to eat or drink . Nausea that stops you from eating or drinking and/or is not relieved by prescribed medicines . Diarrhea, 4 times in one day or diarrhea with lack of strength or a feeling of being dizzy . Lasting loss of  appetite or rapid weight loss of five pounds in a week . Swelling of arms, hand, legs, and/or feet . Weight gain of 5 pounds in one week (fluid retention) . A new rash and/or itching that is not relieved by prescribed medicines . Signs of infusion reaction: fever or shaking chills, flushing, facial swelling, feeling dizzy, headache, trouble breathing, rash, itching, chest tightness, or chest pain. If this happens call 911 for emergency care. . If you think you may be pregnant  Reproduction Warnings . Pregnancy warning: This drug can have harmful effects on the unborn baby. Women of childbearing potential should use effective methods of birth control during your cancer treatment and for 7 months after treatment. Let your doctor know right away if you think you may be pregnant during treatment or within 7 months of receiving treatment. . Breastfeeding warning: It is not known if this drug passes into breast milk. For this reason, women should talk to their doctor about the risks and benefits of breastfeeding during treatment with this drug and for 7 months after treatment because this drug may enter the breast milk and cause harm to a breastfeeding baby. . Fertility warning: Human fertility studies have not been done with this drug. Talk with your doctor or nurse if you plan to have children. Ask for information on sperm or egg banking.  SELF CARE ACTIVITIES WHILE ON CHEMOTHERAPY:  Hydration Increase your fluid intake 48 hours prior to treatment and drink at least 8 to 12 cups (64 ounces) of water/decaffeinated beverages per day after treatment. You can still have your cup of coffee or soda but these beverages do not count as part of your 8 to 12 cups that you need to drink daily. No alcohol intake.  Medications Continue taking your normal prescription medication as prescribed.  If you start any new herbal or new supplements please let us know first to make sure it is safe.  Mouth  Care Have teeth cleaned professionally before starting treatment. Keep dentures and partial plates clean. Use soft toothbrush and do not use mouthwashes that contain alcohol. Biotene is a good mouthwash that is available at most pharmacies or may be ordered by calling [phone]. Use warm salt water gargles (1 teaspoon salt per 1 quart warm water) before and after meals and at bedtime. Or you may rinse with 2 tablespoons of three-percent hydrogen peroxide mixed in eight ounces of water. If you are still having problems with your mouth or sores in your mouth please call the clinic. If you need dental work, please let the doctor know before you go for your appointment so that  we can coordinate the best possible time for you in regards to your chemo regimen. You need to also let your dentist know that you are actively taking chemo. We may need to do labs prior to your dental appointment.  Skin Care Always use sunscreen that has not expired and with SPF (Sun Protection Factor) of 50 or higher. Wear hats to protect your head from the sun. Remember to use sunscreen on your hands, ears, face, & feet.  Use good moisturizing lotions such as udder cream, eucerin, or even Vaseline. Some chemotherapies can cause dry skin, color changes in your skin and nails.    . Avoid long, hot showers or baths. . Use gentle, fragrance-free soaps and laundry detergent. . Use moisturizers, preferably creams or ointments rather than lotions because the thicker consistency is better at preventing skin dehydration. Apply the cream or ointment within 15 minutes of showering. Reapply moisturizer at night, and moisturize your hands every time after you wash them.  Hair Loss (if your doctor says your hair will fall out)  . If your doctor says that your hair is likely to fall out, decide before you begin chemo whether you want to wear a wig. You may want to shop before treatment to match your hair color. . Hats, turbans, and scarves  can also camouflage hair loss, although some people prefer to leave their heads uncovered. If you go bare-headed outdoors, be sure to use sunscreen on your scalp. . Cut your hair short. It eases the inconvenience of shedding lots of hair, but it also can reduce the emotional impact of watching your hair fall out. . Don't perm or color your hair during chemotherapy. Those chemical treatments are already damaging to hair and can enhance hair loss. Once your chemo treatments are done and your hair has grown back, it's OK to resume dyeing or perming hair. With chemotherapy, hair loss is almost always temporary. But when it grows back, it may be a different color or texture. In older adults who still had hair color before chemotherapy, the new growth may be completely gray.  Often, new hair is very fine and soft.  Infection Prevention Please wash your hands for at least 30 seconds using warm soapy water. Handwashing is the #1 way to prevent the spread of germs. Stay away from sick people or people who are getting over a cold. If you develop respiratory systems such as green/yellow mucus production or productive cough or persistent cough let us know and we will see if you need an antibiotic. It is a good idea to keep a pair of gloves on when going into grocery stores/[employer] to decrease your risk of coming into contact with germs on the carts, etc. Carry alcohol hand gel with you at all times and use it frequently if out in public. If your temperature reaches 100.5 or higher please call the clinic and let us know.  If it is after hours or on the weekend please go to the ER if your temperature is over 100.5.  Please have your own personal thermometer at home to use.    Sex and bodily fluids If you are going to have sex, a condom must be used to protect the person that isn't taking chemotherapy. Chemo can decrease your libido (sex drive). For a few days after chemotherapy, chemotherapy can be excreted through your  bodily fluids.  When using the toilet please close the lid and flush the toilet twice.  Do this for a few  day after you have had chemotherapy.   Effects of chemotherapy on your sex life Some changes are simple and won't last long. They won't affect your sex life permanently. Sometimes you may feel: . too tired . not strong enough to be very active . sick or sore  . not in the mood . anxious or low Your anxiety might not seem related to sex. For example, you may be worried about the cancer and how your treatment is going. Or you may be worried about money, or about how you family are coping with your illness. These things can cause stress, which can affect your interest in sex. It's important to talk to your partner about how you feel. Remember - the changes to your sex life don't usually last long. There's usually no medical reason to stop having sex during chemo. The drugs won't have any long term physical effects on your performance or enjoyment of sex. Cancer can't be passed on to your partner during sex  Contraception It's important to use reliable contraception during treatment. Avoid getting pregnant while you or your partner are having chemotherapy. This is because the drugs may harm the baby. Sometimes chemotherapy drugs can leave a man or woman infertile.  This means you would not be able to have children in the future. You might want to talk to someone about permanent infertility. It can be very difficult to learn that you may no longer be able to have children. Some people find counselling helpful. There might be ways to preserve your fertility, although this is easier for men than for women. You may want to speak to a fertility expert. You can talk about sperm banking or harvesting your eggs. You can also ask about other fertility options, such as donor eggs. If you have or have had breast cancer, your doctor might advise you not to take the contraceptive pill. This is because the  hormones in it might affect the cancer.  It is not known for sure whether or not chemotherapy drugs can be passed on through semen or secretions from the vagina. Because of this some doctors advise people to use a barrier method if you have sex during treatment. This applies to vaginal, anal or oral sex. Generally, doctors advise a barrier method only for the time you are actually having the treatment and for about a week after your treatment. Advice like this can be worrying, but this does not mean that you have to avoid being intimate with your partner. You can still have close contact with your partner and continue to enjoy sex.  Animals If you have cats or birds we just ask that you not change the litter or change the cage.  Please have someone else do this for you while you are on chemotherapy.   Food Safety During and After Cancer Treatment Food safety is important for people both during and after cancer treatment. Cancer and cancer treatments, such as chemotherapy, radiation therapy, and stem cell/bone marrow transplantation, often weaken the immune system. This makes it harder for your body to protect itself from foodborne illness, also called food poisoning. Foodborne illness is caused by eating food that contains harmful bacteria, parasites, or viruses.  Foods to avoid Some foods have a higher risk of becoming tainted with bacteria. These include: Marland Kitchen Unwashed fresh fruit and vegetables, especially leafy vegetables that can hide dirt and other contaminants . Raw sprouts, such as alfalfa sprouts . Raw or undercooked beef, especially ground beef, or other  raw or undercooked meat and poultry . Fatty, fried, or spicy foods immediately before or after treatment.  These can sit heavy on your stomach and make you feel nauseous. . Raw or undercooked shellfish, such as oysters. . Sushi and sashimi, which often contain raw fish.  . Unpasteurized beverages, such as unpasteurized fruit juices, raw  milk, raw yogurt, or cider . Undercooked eggs, such as soft boiled, over easy, and poached; raw, unpasteurized eggs; or foods made with raw egg, such as homemade raw cookie dough and homemade mayonnaise Simple steps for food safety Shop smart. . Do not buy food stored or displayed in an unclean area. . Do not buy bruised or damaged fruits or vegetables. . Do not buy cans that have cracks, dents, or bulges. . Pick up foods that can spoil at the end of your shopping trip and store them in a cooler on the way home. Prepare and clean up foods carefully. . Rinse all fresh fruits and vegetables under running water, and dry them with a clean towel or paper towel. . Clean the top of cans before opening them. . After preparing food, wash your hands for 20 seconds with hot water and soap. Pay special attention to areas between fingers and under nails. . Clean your utensils and dishes with hot water and soap. Marland Kitchen Disinfect your kitchen and cutting boards using 1 teaspoon of liquid, unscented bleach mixed into 1 quart of water.   Dispose of old food. . Eat canned and packaged food before its expiration date (the "use by" or "best before" date). . Consume refrigerated leftovers within 3 to 4 days. After that time, throw out the food. Even if the food does not smell or look spoiled, it still may be unsafe. Some bacteria, such as Listeria, can grow even on foods stored in the refrigerator if they are kept for too long. Take precautions when eating out. . At restaurants, avoid buffets and salad bars where food sits out for a long time and comes in contact with many people. Food can become contaminated when someone with a virus, often a norovirus, or another "bug" handles it. . Put any leftover food in a "to-go" container yourself, rather than having the server do it. And, refrigerate leftovers as soon as you get home. . Choose restaurants that are clean and that are willing to prepare your food as you order it  cooked.   MEDICATIONS:                                                                                                                                                                Compazine/Prochlorperazine 10mg  tablet. Take 1 tablet every 6 hours as needed for nausea/vomiting. (This can make you sleepy)   EMLA cream. Apply a quarter  size amount to port site 1 hour prior to chemo. Do not rub in. Cover with plastic wrap.   Over-the-Counter Meds:  Colace - 100 mg capsules - take 2 capsules daily.  If this doesn't help then you can increase to 2 capsules twice daily.  Call us if this does not help your bowels move.   Imodium 2mg  capsule. Take 2 capsules after the 1st loose stool and then 1 capsule every 2 hours until you go a total of 12 hours without having a loose stool. Call the Cancer Center if loose stools continue. If diarrhea occurs at bedtime, take 2 capsules at bedtime. Then take 2 capsules every 4 hours until morning. Call Cancer Center.    Diarrhea Sheet   If you are having loose stools/diarrhea, please purchase Imodium and begin taking as outlined:  At the first sign of poorly formed or loose stools you should begin taking Imodium (loperamide) 2 mg capsules.  Take two caplets (4mg ) followed by one caplet (2mg ) every 2 hours until you have had no diarrhea for 12 hours.  During the night take two caplets (4mg ) at bedtime and continue every 4 hours during the night until the morning.  Stop taking Imodium only after there is no sign of diarrhea for 12 hours.    Always call the Cancer Center if you are having loose stools/diarrhea that you can't get under control.  Loose stools/diarrhea leads to dehydration (loss of water) in your body.  We have other options of trying to get the loose stools/diarrhea to stop but you must let us know!   Constipation Sheet  Colace - 100 mg capsules - take 2 capsules daily.  If this doesn't help then you can increase to 2 capsules twice  daily.  Please call if the above does not work for you.   Do not go more than 2 days without a bowel movement.  It is very important that you do not become constipated.  It will make you feel sick to your stomach (nausea) and can cause abdominal pain and vomiting.   Nausea Sheet   Compazine/Prochlorperazine 10mg  tablet. Take 1 tablet every 6 hours as needed for nausea/vomiting. (This can make you sleepy)  If you are having persistent nausea (nausea that does not stop) please call the Cancer Center and let us know the amount of nausea that you are experiencing.  If you begin to vomit, you need to call the Cancer Center and if it is the weekend and you have vomited more than one time and can't get it to stop-go to the Emergency Room.  Persistent nausea/vomiting can lead to dehydration (loss of fluid in your body) and will make you feel terrible.   Ice chips, sips of clear liquids, foods that are @ room temperature, crackers, and toast tend to be better tolerated.   SYMPTOMS TO REPORT AS SOON AS POSSIBLE AFTER TREATMENT:   FEVER GREATER THAN 100.5 F  CHILLS WITH OR WITHOUT FEVER  NAUSEA AND VOMITING THAT IS NOT CONTROLLED WITH YOUR NAUSEA MEDICATION  UNUSUAL SHORTNESS OF BREATH  UNUSUAL BRUISING OR BLEEDING  TENDERNESS IN MOUTH AND THROAT WITH OR WITHOUT PRESENCE OF ULCERS  URINARY PROBLEMS  BOWEL PROBLEMS  UNUSUAL RASH      Wear comfortable clothing and clothing appropriate for easy access to any Portacath or PICC line. Let us know if there is anything that we can do to make your therapy better!    What to do if you need assistance after  hours or on the weekends: CALL [phone].  HOLD on the line, do not hang up.  You will hear multiple messages but at the end you will be connected with a nurse triage line.  They will contact the doctor if necessary.  Most of the time they will be able to assist you.  Do not call the hospital operator.      I have been informed and  understand all of the instructions given to me and have received a copy. I have been instructed to call the clinic [phone] or my family physician as soon as possible for continued medical care, if indicated. I do not have any more questions at this time but understand that I may call the Cancer Center or the Patient Navigator at [phone] during office hours should I have questions or need assistance in obtaining follow-up care.

## 2018-07-25 NOTE — Progress Notes (Signed)
[place], [place] [address]   CLINIC:  Medical Oncology/Hematology  PCP:  [name], MD [place] [address] [phone]   REASON FOR VISIT: Follow-up for recurrecnt metastatic breast cancer, ER-/PR-/HER2+  CURRENT THERAPY: Herceptin every 3 weeks   INTERVAL HISTORY:  Carol Duarte 84 y.o. female returns for routine follow-up for recurrent metastatic breast cancer. She is here today with her daughter. She is feeling great. Her back pain is gone and she is back to her normal everyday activities. She denies any new pains or lumps present. Denies any nausea, vomiting, or diarrhea. Denies any fevers or recent infections. Denies any headaches or vision changes. She reports her appetite at 100% and she has no problem maintaining her weight. Her energy level is 75%.    REVIEW OF SYSTEMS:  Review of Systems  All other systems reviewed and are negative.    PAST MEDICAL/SURGICAL HISTORY:  Past Medical History:  Diagnosis Date  . Cancer ([place])    left breast  . History of kidney stones   . Hypercholesteremia   . Hypertension    Past Surgical History:  Procedure Laterality Date  . APPENDECTOMY    . brain cyst removed  2011  . BREAST BIOPSY Left 03/28/2018   Procedure: BREAST BIOPSY;  Surgeon: [name], MD;  Location: [facility];  Service: General;  Laterality: Left;  . CATARACT EXTRACTION W/PHACO Left 11/09/2015   Procedure: CATARACT EXTRACTION PHACO AND INTRAOCULAR LENS PLACEMENT LEFT EYE cde=8.97;  Surgeon: [name], MD;  Location: [facility];  Service: Ophthalmology;  Laterality: Left;  . EYE SURGERY     KPE left  . MASTECTOMY MODIFIED RADICAL Left 05/28/2018   Procedure: LEFT MODIFIED RADICAL MASTECTOMY;  Surgeon: [name], MD;  Location: [facility];  Service: General;  Laterality: Left;  Marland Kitchen MASTECTOMY, PARTIAL Left 04/24/2017   Procedure: MASTECTOMY PARTIAL;  Surgeon: [name], MD;  Location: [facility];  Service:  General;  Laterality: Left;  . TONSILLECTOMY    . TONSILLECTOMY AND ADENOIDECTOMY       SOCIAL HISTORY:  Social History   Socioeconomic History  . Marital status: Widowed    Spouse name: Not on file  . Number of children: Not on file  . Years of education: Not on file  . Highest education level: Not on file  Occupational History  . Not on file  Social Needs  . Financial resource strain: Not hard at all  . Food insecurity:    Worry: Never true    Inability: Never true  . Transportation needs:    Medical: No    Non-medical: No  Tobacco Use  . Smoking status: Never Smoker  . Smokeless tobacco: Never Used  Substance and Sexual Activity  . Alcohol use: No  . Drug use: No  . Sexual activity: Not Currently    Partners: Male  Lifestyle  . Physical activity:    Days per week: Patient refused    Minutes per session: Patient refused  . Stress: Not at all  Relationships  . Social connections:    Talks on phone: Patient refused    Gets together: Patient refused    Attends religious service: Patient refused    Active member of club or organization: Patient refused    Attends meetings of clubs or organizations: Patient refused    Relationship status: Patient refused  . Intimate partner violence:    Fear of current or ex partner: Patient refused  Emotionally abused: Patient refused    Physically abused: Patient refused    Forced sexual activity: Patient refused  Other Topics Concern  . Not on file  Social History Narrative  . Not on file    FAMILY HISTORY:  Family History  Problem Relation Age of Onset  . Heart failure Mother   . Heart failure Father     CURRENT MEDICATIONS:  Outpatient Encounter Medications as of 07/25/2018  Medication Sig  . amLODipine (NORVASC) 5 MG tablet Take 5 mg by mouth daily.  Marland Kitchen aspirin EC 81 MG tablet Take 1 tablet (81 mg total) by mouth daily.  . ferrous sulfate 325 (65 FE) MG tablet Take 325 mg by mouth daily with breakfast.  .  labetalol (NORMODYNE) 100 MG tablet Take 1 tablet (100 mg total) by mouth 2 (two) times daily.  Marland Kitchen LORazepam (ATIVAN) 1 MG tablet Take 1 tablet (1 mg total) by mouth every 8 (eight) hours.  . naproxen sodium (ALEVE) 220 MG tablet Take 220 mg by mouth daily as needed (for headache or pain).  Marland Kitchen oxybutynin (DITROPAN) 5 MG tablet Take 5 mg by mouth daily.   . simvastatin (ZOCOR) 40 MG tablet Take 40 mg by mouth daily.  . traMADol (ULTRAM) 50 MG tablet Take 50 mg by mouth every 12 (twelve) hours as needed.   No facility-administered encounter medications on file as of 07/25/2018.     ALLERGIES:  No Known Allergies   PHYSICAL EXAM:  ECOG Performance status: 1  Vitals:   07/25/18 0824  BP: (!) 157/49  Pulse: (!) 59  Resp: 16  Temp: 98.3 F (36.8 C)  SpO2: 98%   Filed Weights   07/25/18 0824  Weight: 143 lb 9.6 oz (65.1 kg)    Physical Exam  Constitutional: She is oriented to person, place, and time. She appears well-developed and well-nourished.  Musculoskeletal: Normal range of motion.  Neurological: She is alert and oriented to person, place, and time.  Skin: Skin is warm and dry.  Psychiatric: She has a normal mood and affect. Her behavior is normal. Judgment and thought content normal.     LABORATORY DATA:  I have reviewed the labs as listed.  CBC    Component Value Date/Time   WBC 5.3 07/25/2018 0946   RBC 3.68 (L) 07/25/2018 0946   HGB 11.2 (L) 07/25/2018 0946   HCT 36.3 07/25/2018 0946   PLT 193 07/25/2018 0946   MCV 98.6 07/25/2018 0946   MCH 30.4 07/25/2018 0946   MCHC 30.9 07/25/2018 0946   RDW 13.1 07/25/2018 0946   LYMPHSABS 0.9 07/25/2018 0946   MONOABS 0.5 07/25/2018 0946   EOSABS 0.4 07/25/2018 0946   BASOSABS 0.0 07/25/2018 0946   CMP Latest Ref Rng & Units 07/25/2018 05/29/2018 05/23/2018  Glucose 70 - 99 mg/dL 122(H) 122(H) 121(H)  BUN 8 - 23 mg/dL 28(H) 23 31(H)  Creatinine 0.44 - 1.00 mg/dL 1.23(H) 1.02(H) 1.24(H)  Sodium 135 - 145 mmol/L 140  140 138  Potassium 3.5 - 5.1 mmol/L 4.6 4.1 4.7  Chloride 98 - 111 mmol/L 110 110 108  CO2 22 - 32 mmol/L '23 24 23  ' Calcium 8.9 - 10.3 mg/dL 8.8(L) 8.6(L) 8.8(L)  Total Protein 6.5 - 8.1 g/dL 6.3(L) - -  Total Bilirubin 0.3 - 1.2 mg/dL 0.7 - -  Alkaline Phos 38 - 126 U/L 45 - -  AST 15 - 41 U/L 16 - -  ALT 0 - 44 U/L 15 - -  DIAGNOSTIC IMAGING:  I have independently reviewed the scans and discussed with the patient.   I have reviewed Francene Finders, NP's note and agree with the documentation.  I personally performed a face-to-face visit, made revisions and my assessment and plan is as follows.   ASSESSMENT & PLAN:   Invasive ductal carcinoma of left breast ([place]) 1.  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  No lymph node sampling was done. - In July 2019, she noticed a lump at her prior lumpectomy site, which was painful.  She did not want to have mammogram done. -Biopsy of this lump on 03/28/2018 by Dr. Arnoldo Morale showed invasive ductal carcinoma with calcifications (2.2 cm), ER negative, PR negative, Ki-67 40%, HER-2 3+ positive by IHC.  3/3 lymph nodes positive. - She underwent left modified radical mastectomy on 05/28/2018. -I have reviewed PET CT scan dated 07/23/2018 which showed few scattered mildly hypermetabolic pulmonary nodules as well as hypermetabolic left axillary, right paratracheal, right hilar, subcarinal adenopathy compatible with metastatic disease in the chest. - We talked about further management including best supportive care versus trying single agent Herceptin.  She is continuing to be very active and does dancing on Thursday nights.  She lives independently at  home. - She would like to try a single agent Herceptin.  If she tolerates it well we can add lapatinib at a later time. -We will schedule her for port placement by Dr. Arnoldo Morale. -I will also obtain a 2D echocardiogram.  Last 2D echocardiogram in 2017 was normal.   Total time spent is 40 minutes with more than 50% of the time spent face-to-face discussing scan results, reviewing scans with the patient, treatment options and coordination of care.  Orders placed this encounter:  Orders Placed This Encounter  Procedures  . CBC with Differential/Platelet  . Comprehensive metabolic panel  . CBC with Differential/Platelet  . Comprehensive metabolic panel  . Cancer antigen 27.29  . Cancer antigen 15-3  . ECHOCARDIOGRAM COMPLETE      Derek Jack, [place] [phone]

## 2018-07-25 NOTE — Progress Notes (Signed)
Information provided to patient on Herceptin. I explained what to expect the first day of treatment. Patient was provided my phone number to reach in case she had any questions or concerns.  She verbalizes understanding.

## 2018-07-25 NOTE — Assessment & Plan Note (Signed)
1.  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  No lymph node sampling was done. - In July 2019, she noticed a lump at her prior lumpectomy site, which was painful.  She did not want to have mammogram done. -Biopsy of this lump on 03/28/2018 by Dr. Jenkins showed invasive ductal carcinoma with calcifications (2.2 cm), ER negative, PR negative, Ki-67 40%, HER-2 3+ positive by IHC.  3/3 lymph nodes positive. - She underwent left modified radical mastectomy on 05/28/2018. -I have reviewed PET CT scan dated 07/23/2018 which showed few scattered mildly hypermetabolic pulmonary nodules as well as hypermetabolic left axillary, right paratracheal, right hilar, subcarinal adenopathy compatible with metastatic disease in the chest. - We talked about further management including best supportive care versus trying single agent Herceptin.  She is continuing to be very active and does dancing on Thursday nights.  She lives independently at home. - She would like to try a single agent Herceptin.  If she tolerates it well we can add lapatinib at a later time. -We will schedule her for port placement by Dr. Jenkins. -I will also obtain a 2D echocardiogram.  Last 2D echocardiogram in 2017 was normal. 

## 2018-07-25 NOTE — Patient Instructions (Signed)
[facility] at [facility] Discharge Instructions     Thank you for choosing  Cancer Center at [facility] to provide your oncology and hematology care.  To afford each patient quality time with our provider, please arrive at least 15 minutes before your scheduled appointment time.   If you have a lab appointment with the Cancer Center please come in thru the  Main Entrance and check in at the main information desk  You need to re-schedule your appointment should you arrive 10 or more minutes late.  We strive to give you quality time with our providers, and arriving late affects you and other patients whose appointments are after yours.  Also, if you no show three or more times for appointments you may be dismissed from the clinic at the providers discretion.     Again, thank you for choosing [facility].  Our hope is that these requests will decrease the amount of time that you wait before being seen by our physicians.       _____________________________________________________________  Should you have questions after your visit to [facility], please contact our office at [phone] between the hours of 8:00 a.m. and 4:30 p.m.  Voicemails left after 4:00 p.m. will not be returned until the following business day.  For prescription refill requests, have your pharmacy contact our office and allow 72 hours.    Cancer Center Support Programs:   > Cancer Support Group  2nd Tuesday of the month 1pm-2pm, Journey Room    

## 2018-07-30 NOTE — H&P (Signed)
Carol Duarte; [id number]; 1925/04/09   HPI Patient is a 84 year old white female who was referred back to my care by Dr. Legrand Rams for evaluation treatment of a left breast mass.  She developed a tender area along the medial aspect of her previous left partial mastectomy scar.  She underwent a left partial mastectomy in 2018 for invasive ductal carcinoma.  She did not undergo chemotherapy or radiation therapy after the surgery.  She states she noticed the increased thickness and tenderness just below her left nipple.  She subsequently underwent a left mastectomy.  She now presents for Port-A-Cath insertion. Past Medical History:  Diagnosis Date  . Hypertension     Past Surgical History:  Procedure Laterality Date  . APPENDECTOMY    . brain cyst removed    . CATARACT EXTRACTION W/PHACO Left 11/09/2015   Procedure: CATARACT EXTRACTION PHACO AND INTRAOCULAR LENS PLACEMENT LEFT EYE cde=8.97;  Surgeon: [name], MD;  Location: [facility];  Service: Ophthalmology;  Laterality: Left;  . EYE SURGERY     KPE left  . MASTECTOMY, PARTIAL Left 04/24/2017   Procedure: MASTECTOMY PARTIAL;  Surgeon: [name], MD;  Location: [facility];  Service: General;  Laterality: Left;  . TONSILLECTOMY    . TONSILLECTOMY AND ADENOIDECTOMY      Family History  Problem Relation Age of Onset  . Heart failure Mother   . Heart failure Father     Current Outpatient Medications on File Prior to Visit  Medication Sig Dispense Refill  . acetaminophen (TYLENOL) 500 MG tablet Take 1,000 mg by mouth every 6 (six) hours as needed for mild pain or fever.     Marland Kitchen amLODipine (NORVASC) 5 MG tablet Take 5 mg by mouth daily.    Marland Kitchen aspirin EC 81 MG tablet Take 1 tablet (81 mg total) by mouth daily. 90 tablet 3  . ferrous sulfate 325 (65 FE) MG tablet Take 325 mg by mouth daily with breakfast.    . labetalol (NORMODYNE) 100 MG tablet Take 1 tablet (100 mg total) by mouth 2 (two) times daily. 60 tablet 12  . oxybutynin (DITROPAN) 5 MG tablet  Take 5 mg by mouth daily.     . simvastatin (ZOCOR) 40 MG tablet Take 40 mg by mouth daily.     No current facility-administered medications on file prior to visit.     No Known Allergies  Social History   Substance and Sexual Activity  Alcohol Use No    Social History   Tobacco Use  Smoking Status Never Smoker  Smokeless Tobacco Former Systems developer    Review of Systems  Constitutional: Negative.   HENT: Negative.   Eyes: Negative.   Respiratory: Negative.   Cardiovascular: Negative.   Gastrointestinal: Negative.   Genitourinary: Negative.   Musculoskeletal: Negative.   Skin: Negative.   Neurological: Negative.   Endo/Heme/Allergies: Negative.   Psychiatric/Behavioral: Negative.     Objective   Vitals:   03/13/18 1546  BP: (!) 178/97  Pulse: (!) 58  Resp: 20  Temp: (!) 96.9 F (36.1 C)    Physical Exam  Constitutional: She is oriented to person, place, and time. She appears well-developed and well-nourished. No distress.  HENT:  Head: Normocephalic and atraumatic.  Cardiovascular: Normal rate, regular rhythm and normal heart sounds. Exam reveals no gallop and no friction rub.  No murmur heard. Pulmonary/Chest: Effort normal and breath sounds normal. No stridor. No respiratory distress. She has no wheezes. She has no rales.  Abdominal: Soft. Bowel sounds are normal.  She exhibits no distension and no mass. There is no tenderness. There is no guarding.  Neurological: She is alert and oriented to person, place, and time.  Skin: Skin is warm and dry.  Vitals reviewed. Breast: Well-healed left mastectomy surgical site Assessment  Left breast carcinoma Plan   Patient is scheduled for Port-A-Cath insertion on 08/08/2018.  The risks and benefits of the procedure including bleeding, infection, and pneumothorax were fully explained to the patient, who gave informed consent.

## 2018-08-02 ENCOUNTER — Encounter (HOSPITAL_COMMUNITY): Payer: Self-pay

## 2018-08-02 ENCOUNTER — Ambulatory Visit: Payer: Medicare Other | Admitting: General Surgery

## 2018-08-03 ENCOUNTER — Encounter (HOSPITAL_COMMUNITY)
Admission: RE | Admit: 2018-08-03 | Discharge: 2018-08-03 | Disposition: A | Discharge status: Home / Self Care | Payer: Medicare Other | Source: Ambulatory Visit | Attending: General Surgery | Admitting: General Surgery

## 2018-08-03 ENCOUNTER — Ambulatory Visit (HOSPITAL_COMMUNITY)
Admission: RE | Admit: 2018-08-03 | Discharge: 2018-08-03 | Disposition: A | Discharge status: Home / Self Care | Payer: Medicare Other | Source: Ambulatory Visit | Attending: Nurse Practitioner | Admitting: Nurse Practitioner

## 2018-08-03 DIAGNOSIS — I10 Essential (primary) hypertension: Secondary | ICD-10-CM | POA: Diagnosis not present

## 2018-08-03 DIAGNOSIS — Z9012 Acquired absence of left breast and nipple: Secondary | ICD-10-CM | POA: Insufficient documentation

## 2018-08-03 DIAGNOSIS — E785 Hyperlipidemia, unspecified: Secondary | ICD-10-CM | POA: Diagnosis not present

## 2018-08-03 DIAGNOSIS — C50912 Malignant neoplasm of unspecified site of left female breast: Secondary | ICD-10-CM | POA: Insufficient documentation

## 2018-08-03 NOTE — Progress Notes (Signed)
*  PRELIMINARY RESULTS* Echocardiogram 2D Echocardiogram has been performed.  Carol Duarte 08/03/2018, 9:05 AM

## 2018-08-06 ENCOUNTER — Other Ambulatory Visit (HOSPITAL_COMMUNITY): Payer: Self-pay | Admitting: Hematology

## 2018-08-06 DIAGNOSIS — Z7189 Other specified counseling: Secondary | ICD-10-CM | POA: Insufficient documentation

## 2018-08-06 NOTE — Progress Notes (Signed)
START OFF PATHWAY REGIMEN - Breast   [id number]:Trastuzumab (Maintenance - w/Loading Dose):   A cycle is every 21 days:     Trastuzumab-xxxx      Trastuzumab-xxxx   **Always confirm dose/schedule in your pharmacy ordering system**  Patient Characteristics: Distant Metastases or Locoregional Recurrent Disease - Unresected or Locally Advanced Unresectable Disease Progressing after Neoadjuvant and Local Therapies, HER2 Positive, ER Negative/Unknown, Chemotherapy, First Line Therapeutic Status: Distant Metastases BRCA Mutation Status: Did Not Order Test ER Status: Negative (-) HER2 Status: Positive (+) PR Status: Negative (-) Line of Therapy: First Line Intent of Therapy: Non-Curative / Palliative Intent, Discussed with Patient

## 2018-08-07 MED ORDER — PROCHLORPERAZINE MALEATE 10 MG PO TABS: 10.0000 mg | ORAL_TABLET | Freq: Four times a day (QID) | ORAL | 1 refills | Status: DC | PRN

## 2018-08-07 MED ORDER — LIDOCAINE-PRILOCAINE 2.5-2.5 % EX CREA: TOPICAL_CREAM | CUTANEOUS | 3 refills | Status: DC

## 2018-08-07 NOTE — Progress Notes (Signed)
Extensive teaching packet for Herceptin pulled together.

## 2018-08-07 NOTE — Patient Instructions (Addendum)
Chemotherapy to start 12/12

## 2018-08-08 ENCOUNTER — Encounter (HOSPITAL_COMMUNITY): Payer: Self-pay | Admitting: Anesthesiology

## 2018-08-08 ENCOUNTER — Ambulatory Visit (HOSPITAL_COMMUNITY)
Admission: RE | Admit: 2018-08-08 | Discharge: 2018-08-08 | Disposition: A | Discharge status: Home / Self Care | Payer: Medicare Other | Source: Ambulatory Visit | Attending: General Surgery | Admitting: General Surgery

## 2018-08-08 ENCOUNTER — Ambulatory Visit (HOSPITAL_COMMUNITY): Payer: Medicare Other | Admitting: Anesthesiology

## 2018-08-08 ENCOUNTER — Ambulatory Visit (HOSPITAL_COMMUNITY): Payer: Medicare Other

## 2018-08-08 ENCOUNTER — Encounter (HOSPITAL_COMMUNITY)
Admission: RE | Disposition: A | Discharge status: Home / Self Care | Payer: Self-pay | Source: Ambulatory Visit | Attending: General Surgery

## 2018-08-08 DIAGNOSIS — Z79899 Other long term (current) drug therapy: Secondary | ICD-10-CM | POA: Diagnosis not present

## 2018-08-08 DIAGNOSIS — Z7982 Long term (current) use of aspirin: Secondary | ICD-10-CM | POA: Diagnosis not present

## 2018-08-08 DIAGNOSIS — Z95828 Presence of other vascular implants and grafts: Secondary | ICD-10-CM

## 2018-08-08 DIAGNOSIS — I10 Essential (primary) hypertension: Secondary | ICD-10-CM | POA: Diagnosis not present

## 2018-08-08 DIAGNOSIS — Z9012 Acquired absence of left breast and nipple: Secondary | ICD-10-CM | POA: Insufficient documentation

## 2018-08-08 DIAGNOSIS — Z452 Encounter for adjustment and management of vascular access device: Secondary | ICD-10-CM | POA: Diagnosis not present

## 2018-08-08 DIAGNOSIS — C50912 Malignant neoplasm of unspecified site of left female breast: Secondary | ICD-10-CM | POA: Insufficient documentation

## 2018-08-08 HISTORY — PX: PORTACATH PLACEMENT: SHX2246

## 2018-08-08 SURGERY — INSERTION, TUNNELED CENTRAL VENOUS DEVICE, WITH PORT
Anesthesia: General | Site: Chest | Laterality: Right

## 2018-08-08 MED ORDER — CHLORHEXIDINE GLUCONATE CLOTH 2 % EX PADS: 6.0000 | MEDICATED_PAD | Freq: Once | CUTANEOUS | Status: DC

## 2018-08-08 MED ORDER — PROMETHAZINE HCL 25 MG/ML IJ SOLN: 6.2500 mg | INTRAMUSCULAR | Status: DC | PRN

## 2018-08-08 MED ORDER — LACTATED RINGERS IV SOLN
INTRAVENOUS | Status: DC
  Administered 2018-08-08: 10:00:00 via INTRAVENOUS

## 2018-08-08 MED ORDER — MIDAZOLAM HCL 2 MG/2ML IJ SOLN: 0.5000 mg | Freq: Once | INTRAMUSCULAR | Status: DC | PRN

## 2018-08-08 MED ORDER — CEFAZOLIN SODIUM-DEXTROSE 2-4 GM/100ML-% IV SOLN
2.0000 g | INTRAVENOUS | Status: AC
  Administered 2018-08-08: 2 g via INTRAVENOUS
  Filled 2018-08-08: qty 100

## 2018-08-08 MED ORDER — PROPOFOL 500 MG/50ML IV EMUL
INTRAVENOUS | Status: DC | PRN
  Administered 2018-08-08: 100 ug/kg/min via INTRAVENOUS

## 2018-08-08 MED ORDER — HEPARIN SOD (PORK) LOCK FLUSH 100 UNIT/ML IV SOLN
INTRAVENOUS | Status: DC | PRN
  Administered 2018-08-08: 500 [IU] via INTRAVENOUS

## 2018-08-08 MED ORDER — HYDROCODONE-ACETAMINOPHEN 7.5-325 MG PO TABS: 1.0000 | ORAL_TABLET | Freq: Once | ORAL | Status: DC | PRN

## 2018-08-08 MED ORDER — LIDOCAINE HCL (PF) 1 % IJ SOLN
INTRAMUSCULAR | Status: DC | PRN
  Administered 2018-08-08: 15 mL

## 2018-08-08 MED ORDER — HYDROMORPHONE HCL 1 MG/ML IJ SOLN: 0.2500 mg | INTRAMUSCULAR | Status: DC | PRN

## 2018-08-08 MED ORDER — SODIUM CHLORIDE (PF) 0.9 % IJ SOLN
INTRAMUSCULAR | Status: DC | PRN
  Administered 2018-08-08: 10 mL

## 2018-08-08 MED ORDER — PROPOFOL 10 MG/ML IV BOLUS
INTRAVENOUS | Status: DC | PRN
  Administered 2018-08-08: 40 mg via INTRAVENOUS
  Administered 2018-08-08: 20 mg via INTRAVENOUS

## 2018-08-08 MED ORDER — HEPARIN SOD (PORK) LOCK FLUSH 100 UNIT/ML IV SOLN
INTRAVENOUS | Status: AC
  Filled 2018-08-08: qty 5

## 2018-08-08 MED ORDER — LIDOCAINE HCL (PF) 1 % IJ SOLN
INTRAMUSCULAR | Status: AC
  Filled 2018-08-08: qty 30

## 2018-08-08 SURGICAL SUPPLY — 31 items
ADH SKN CLS APL DERMABOND .7 (GAUZE/BANDAGES/DRESSINGS) ×1
BAG DECANTER FOR FLEXI CONT (MISCELLANEOUS) ×3 IMPLANT
CHLORAPREP W/TINT 10.5 ML (MISCELLANEOUS) ×3 IMPLANT
CLOTH BEACON ORANGE TIMEOUT ST (SAFETY) ×3 IMPLANT
COVER LIGHT HANDLE STERIS (MISCELLANEOUS) ×6 IMPLANT
DECANTER SPIKE VIAL GLASS SM (MISCELLANEOUS) ×3 IMPLANT
DERMABOND ADVANCED (GAUZE/BANDAGES/DRESSINGS) ×2
DERMABOND ADVANCED .7 DNX12 (GAUZE/BANDAGES/DRESSINGS) ×1 IMPLANT
DRAPE C-ARM FOLDED MOBILE STRL (DRAPES) ×3 IMPLANT
ELECT REM PT RETURN 9FT ADLT (ELECTROSURGICAL) ×3
ELECTRODE REM PT RTRN 9FT ADLT (ELECTROSURGICAL) ×1 IMPLANT
GLOVE BIO SURGEON STRL SZ7 (GLOVE) ×2 IMPLANT
GLOVE BIOGEL PI IND STRL 7.0 (GLOVE) ×2 IMPLANT
GLOVE BIOGEL PI INDICATOR 7.0 (GLOVE) ×4
GLOVE SURG SS PI 7.5 STRL IVOR (GLOVE) ×3 IMPLANT
GOWN STRL REUS W/TWL LRG LVL3 (GOWN DISPOSABLE) ×6 IMPLANT
IV NS 500ML (IV SOLUTION) ×3
IV NS 500ML BAXH (IV SOLUTION) ×1 IMPLANT
KIT PORT POWER 8FR ISP MRI (Port) ×3 IMPLANT
KIT TURNOVER KIT A (KITS) ×3 IMPLANT
NDL HYPO 25X1 1.5 SAFETY (NEEDLE) ×1 IMPLANT
NEEDLE HYPO 25X1 1.5 SAFETY (NEEDLE) ×3 IMPLANT
PACK MINOR (CUSTOM PROCEDURE TRAY) ×3 IMPLANT
PAD ARMBOARD 7.5X6 YLW CONV (MISCELLANEOUS) ×3 IMPLANT
SET BASIN LINEN APH (SET/KITS/TRAYS/PACK) ×3 IMPLANT
SUT MNCRL AB 4-0 PS2 18 (SUTURE) ×3 IMPLANT
SUT VIC AB 3-0 SH 27 (SUTURE) ×3
SUT VIC AB 3-0 SH 27X BRD (SUTURE) ×1 IMPLANT
SYR 20CC LL (SYRINGE) ×3 IMPLANT
SYR 5ML LL (SYRINGE) ×3 IMPLANT
SYR CONTROL 10ML LL (SYRINGE) ×3 IMPLANT

## 2018-08-08 NOTE — Discharge Instructions (Signed)
Implanted Port Insertion, Care After °This sheet gives you information about how to care for yourself after your procedure. Your health care provider may also give you more specific instructions. If you have problems or questions, contact your health care provider. °What can I expect after the procedure? °After your procedure, it is common to have: °· Discomfort at the port insertion site. °· Bruising on the skin over the port. This should improve over 3-4 days. ° °Follow these instructions at home: °Port care °· After your port is placed, you will get a manufacturer's information card. The card has information about your port. Keep this card with you at all times. °· Take care of the port as told by your health care provider. Ask your health care provider if you or a family member can get training for taking care of the port at home. A home health care nurse may also take care of the port. °· Make sure to remember what type of port you have. °Incision care °· Follow instructions from your health care provider about how to take care of your port insertion site. Make sure you: °? Wash your hands with soap and water before you change your bandage (dressing). If soap and water are not available, use hand sanitizer. °? Change your dressing as told by your health care provider. °? Leave stitches (sutures), skin glue, or adhesive strips in place. These skin closures may need to stay in place for 2 weeks or longer. If adhesive strip edges start to loosen and curl up, you may trim the loose edges. Do not remove adhesive strips completely unless your health care provider tells you to do that. °· Check your port insertion site every day for signs of infection. Check for: °? More redness, swelling, or pain. °? More fluid or blood. °? Warmth. °? Pus or a bad smell. °General instructions °· Do not take baths, swim, or use a hot tub until your health care provider approves. °· Do not lift anything that is heavier than 10 lb (4.5  kg) for a week, or as told by your health care provider. °· Ask your health care provider when it is okay to: °? Return to work or school. °? Resume usual physical activities or sports. °· Do not drive for 24 hours if you were given a medicine to help you relax (sedative). °· Take over-the-counter and prescription medicines only as told by your health care provider. °· Wear a medical alert bracelet in case of an emergency. This will tell any health care providers that you have a port. °· Keep all follow-up visits as told by your health care provider. This is important. °Contact a health care provider if: °· You cannot flush your port with saline as directed, or you cannot draw blood from the port. °· You have a fever or chills. °· You have more redness, swelling, or pain around your port insertion site. °· You have more fluid or blood coming from your port insertion site. °· Your port insertion site feels warm to the touch. °· You have pus or a bad smell coming from the port insertion site. °Get help right away if: °· You have chest pain or shortness of breath. °· You have bleeding from your port that you cannot control. °Summary °· Take care of the port as told by your health care provider. °· Change your dressing as told by your health care provider. °· Keep all follow-up visits as told by your health care provider. °  This information is not intended to replace advice given to you by your health care provider. Make sure you discuss any questions you have with your health care provider. °Document Released: 06/05/2013 Document Revised: 07/06/2016 Document Reviewed: 07/06/2016 °Elsevier Interactive Patient Education © 2017 [employer] °Implanted Port Home Guide °An implanted port is a type of central line that is placed under the skin. Central lines are used to provide IV access when treatment or nutrition needs to be given through a person’s veins. Implanted ports are used for long-term IV access. An implanted port  may be placed because: °· You need IV medicine that would be irritating to the small veins in your hands or arms. °· You need long-term IV medicines, such as antibiotics. °· You need IV nutrition for a long period. °· You need frequent blood draws for lab tests. °· You need dialysis. ° °Implanted ports are usually placed in the chest area, but they can also be placed in the upper arm, the abdomen, or the leg. An implanted port has two main parts: °· Reservoir. The reservoir is round and will appear as a small, raised area under your skin. The reservoir is the part where a needle is inserted to give medicines or draw blood. °· Catheter. The catheter is a thin, flexible tube that extends from the reservoir. The catheter is placed into a large vein. Medicine that is inserted into the reservoir goes into the catheter and then into the vein. ° °How will I care for my incision site? °Do not get the incision site wet. Bathe or shower as directed by your health care provider. °How is my port accessed? °Special steps must be taken to access the port: °· Before the port is accessed, a numbing cream can be placed on the skin. This helps numb the skin over the port site. °· Your health care provider uses a sterile technique to access the port. °? Your health care provider must put on a mask and sterile gloves. °? The skin over your port is cleaned carefully with an antiseptic and allowed to dry. °? The port is gently pinched between sterile gloves, and a needle is inserted into the port. °· Only "non-coring" port needles should be used to access the port. Once the port is accessed, a blood return should be checked. This helps ensure that the port is in the vein and is not clogged. °· If your port needs to remain accessed for a constant infusion, a clear (transparent) bandage will be placed over the needle site. The bandage and needle will need to be changed every week, or as directed by your health care provider. °· Keep the  bandage covering the needle clean and dry. Do not get it wet. Follow your health care provider’s instructions on how to take a shower or bath while the port is accessed. °· If your port does not need to stay accessed, no bandage is needed over the port. ° °What is flushing? °Flushing helps keep the port from getting clogged. Follow your health care provider’s instructions on how and when to flush the port. Ports are usually flushed with saline solution or a medicine called heparin. The need for flushing will depend on how the port is used. °· If the port is used for intermittent medicines or blood draws, the port will need to be flushed: °? After medicines have been given. °? After blood has been drawn. °? As part of routine maintenance. °· If a constant infusion is   running, the port may not need to be flushed. ° °How long will my port stay implanted? °The port can stay in for as long as your health care provider thinks it is needed. When it is time for the port to come out, surgery will be done to remove it. The procedure is similar to the one performed when the port was put in. °When should I seek immediate medical care? °When you have an implanted port, you should seek immediate medical care if: °· You notice a bad smell coming from the incision site. °· You have swelling, redness, or drainage at the incision site. °· You have more swelling or pain at the port site or the surrounding area. °· You have a fever that is not controlled with medicine. ° °This information is not intended to replace advice given to you by your health care provider. Make sure you discuss any questions you have with your health care provider. °Document Released: 08/15/2005 Document Revised: 01/21/2016 Document Reviewed: 04/22/2013 °Elsevier Interactive Patient Education © 2017 Elsevier Inc. ° °

## 2018-08-08 NOTE — Anesthesia Preprocedure Evaluation (Signed)
Anesthesia Evaluation  Patient identified by MRN, date of birth, ID band Patient awake    Reviewed: Allergy & Precautions, NPO status , Patient's Chart, lab work & pertinent test results  Airway Mallampati: III  TM Distance: >3 FB Neck ROM: Full    Dental no notable dental hx. (+) Teeth Intact   Pulmonary neg pulmonary ROS,    Pulmonary exam normal breath sounds clear to auscultation       Cardiovascular Exercise Tolerance: Good hypertension, Pt. on medications negative cardio ROS Normal cardiovascular examI Rhythm:Regular Rate:Normal  Pt denies CP   Neuro/Psych negative neurological ROS  negative psych ROS   GI/Hepatic negative GI ROS, Neg liver ROS,   Endo/Other  negative endocrine ROS  Renal/GU negative Renal ROSOAB-on ditropan  negative genitourinary   Musculoskeletal negative musculoskeletal ROS (+)   Abdominal   Peds negative pediatric ROS (+)  Hematology negative hematology ROS (+)   Anesthesia Other Findings H/o L mastectomy -here for port for chemo  Reproductive/Obstetrics negative OB ROS                             Anesthesia Physical Anesthesia Plan  ASA: II  Anesthesia Plan: General   Post-op Pain Management:    Induction: Intravenous  PONV Risk Score and Plan:   Airway Management Planned: Nasal Cannula and Simple Face Mask  Additional Equipment:   Intra-op Plan:   Post-operative Plan:   Informed Consent: I have reviewed the patients History and Physical, chart, labs and discussed the procedure including the risks, benefits and alternatives for the proposed anesthesia with the patient or authorized representative who has indicated his/her understanding and acceptance.   Dental advisory given  Plan Discussed with: CRNA  Anesthesia Plan Comments:         Anesthesia Quick Evaluation

## 2018-08-08 NOTE — Op Note (Signed)
Patient:  Carol Duarte  DOB:  [birth year]  MRN:  [id number]   Preop Diagnosis: Left breast carcinoma  Postop Diagnosis: Same  Procedure: Port-A-Cath insertion  Surgeon: [name], MD  Anes: MAC  Indications: Patient is a 84 year old white female recently diagnosed with left breast carcinoma who presents for Port-A-Cath insertion.  The risks and benefits of the procedure including bleeding, infection, and pneumothorax were fully explained to the patient, who gave informed consent.  Procedure note: The patient was placed in Trendelenburg position after the right upper chest was prepped and draped using the usual sterile technique with DuraPrep.  Surgical site confirmation was performed.  1% Xylocaine was used for local anesthesia.  Incision was made below the right clavicle and a subcutaneous pocket was formed.  A needle was advanced into the right subclavian vein using the Seldinger technique without difficulty.  A guidewire was then advanced into the right atrium under fluoroscopic guidance.  An introducer and peel-away sheath were placed over the guidewire.  The peel-away sheath was removed and the catheter attached to the port and the port placed in subcutaneous pocket.  Adequate positioning was confirmed by fluoroscopy.  Good backflow blood was noted on aspiration of the port.  Port was flushed with heparin flush.  Subcutaneous layer was reapproximated using a 3-0 Vicryl interrupted suture.  The skin was closed using a 4-0 Monocryl subcuticular suture.  Dermabond was applied.  All tape and needle counts were correct at the end of the procedure.  Patient was awakened and transferred to PACU in stable condition.  A chest x-ray will be performed at that time.  Complications: None  EBL: Minimal  Specimen: None

## 2018-08-08 NOTE — Anesthesia Procedure Notes (Signed)
Procedure Name: MAC Date/Time: 08/08/2018 10:05 AM Performed by: Vista Deck, CRNA Pre-anesthesia Checklist: Patient identified, Emergency Drugs available, Suction available, Timeout performed and Patient being monitored Patient Re-evaluated:Patient Re-evaluated prior to induction Oxygen Delivery Method: Nasal Cannula

## 2018-08-08 NOTE — Transfer of Care (Signed)
Immediate Anesthesia Transfer of Care Note  Patient: Carol Duarte  Procedure(s) Performed: INSERTION PORT-A-CATH (Right Chest)  Patient Location: PACU  Anesthesia Type:General  Level of Consciousness: awake, alert  and patient cooperative  Airway & Oxygen Therapy: Patient Spontanous Breathing and Patient connected to nasal cannula oxygen  Post-op Assessment: Report given to RN and Post -op Vital signs reviewed and stable  Post vital signs: Reviewed and stable  Last Vitals:  Vitals Value Taken Time  BP    Temp    Pulse 122 08/08/2018 10:43 AM  Resp    SpO2 82 % 08/08/2018 10:43 AM  Vitals shown include unvalidated device data.  Last Pain:  Vitals:   08/08/18 0854  TempSrc: Oral  PainSc: 0-No pain      Patients Stated Pain Goal: 5 (90/24/09 7353)  Complications: No apparent anesthesia complications  Hearing aids to PACU. Patient states she does not want to put them in right now. Two hearing aides in labeled container given to C. Edwards Therapist, sports

## 2018-08-08 NOTE — Interval H&P Note (Signed)
History and Physical Interval Note:  08/08/2018 9:31 AM  Her[employer]  has presented today for surgery, with the diagnosis of left breast cancer  The various methods of treatment have been discussed with the patient and family. After consideration of risks, benefits and other options for treatment, the patient has consented to  Procedure(s): INSERTION PORT-A-CATH (Right) as a surgical intervention .  The patient's history has been reviewed, patient examined, no change in status, stable for surgery.  I have reviewed the patient's chart and labs.  Questions were answered to the patient's satisfaction.     [name]

## 2018-08-08 NOTE — Anesthesia Postprocedure Evaluation (Signed)
Anesthesia Post Note  Patient: Carol Duarte  Procedure(s) Performed: INSERTION PORT-A-CATH (Right Chest)  Patient location during evaluation: PACU Anesthesia Type: General Level of consciousness: awake and alert and patient cooperative Pain management: satisfactory to patient Vital Signs Assessment: post-procedure vital signs reviewed and stable Respiratory status: patient connected to nasal cannula oxygen Cardiovascular status: stable Postop Assessment: no apparent nausea or vomiting Anesthetic complications: no     Last Vitals:  Vitals:   08/08/18 0854 08/08/18 1045  BP: (!) 166/60 136/61  Pulse: (!) 49   Resp: 16   Temp: 36.4 C 36.6 C  SpO2: 96%     Last Pain:  Vitals:   08/08/18 1045  TempSrc:   PainSc: 0-No pain                 Carol Duarte

## 2018-08-09 ENCOUNTER — Inpatient Hospital Stay (HOSPITAL_COMMUNITY): Payer: Medicare Other

## 2018-08-09 ENCOUNTER — Inpatient Hospital Stay (HOSPITAL_COMMUNITY): Payer: Medicare Other | Attending: Hematology | Admitting: Hematology

## 2018-08-09 ENCOUNTER — Inpatient Hospital Stay (HOSPITAL_COMMUNITY): Payer: Medicare Other | Attending: Hematology

## 2018-08-09 ENCOUNTER — Other Ambulatory Visit: Payer: Self-pay

## 2018-08-09 ENCOUNTER — Encounter (HOSPITAL_COMMUNITY): Payer: Self-pay | Admitting: Hematology

## 2018-08-09 VITALS — BP 136/53 | HR 57 | Temp 98.0°F | Resp 18 | Wt 140.2 lb

## 2018-08-09 DIAGNOSIS — C50912 Malignant neoplasm of unspecified site of left female breast: Secondary | ICD-10-CM | POA: Insufficient documentation

## 2018-08-09 DIAGNOSIS — Z8249 Family history of ischemic heart disease and other diseases of the circulatory system: Secondary | ICD-10-CM | POA: Diagnosis not present

## 2018-08-09 DIAGNOSIS — E7800 Pure hypercholesterolemia, unspecified: Secondary | ICD-10-CM

## 2018-08-09 DIAGNOSIS — Z87442 Personal history of urinary calculi: Secondary | ICD-10-CM

## 2018-08-09 DIAGNOSIS — Z7982 Long term (current) use of aspirin: Secondary | ICD-10-CM | POA: Diagnosis not present

## 2018-08-09 DIAGNOSIS — Z5112 Encounter for antineoplastic immunotherapy: Secondary | ICD-10-CM | POA: Diagnosis not present

## 2018-08-09 DIAGNOSIS — Z171 Estrogen receptor negative status [ER-]: Secondary | ICD-10-CM

## 2018-08-09 DIAGNOSIS — I10 Essential (primary) hypertension: Secondary | ICD-10-CM | POA: Diagnosis not present

## 2018-08-09 DIAGNOSIS — Z79899 Other long term (current) drug therapy: Secondary | ICD-10-CM | POA: Insufficient documentation

## 2018-08-09 LAB — COMPREHENSIVE METABOLIC PANEL
ALBUMIN: 3.7 g/dL (ref 3.5–5.0)
ALT: 12 U/L (ref 0–44)
AST: 17 U/L (ref 15–41)
Alkaline Phosphatase: 45 U/L (ref 38–126)
Anion gap: 7 (ref 5–15)
BUN: 20 mg/dL (ref 8–23)
CO2: 24 mmol/L (ref 22–32)
Calcium: 8.9 mg/dL (ref 8.9–10.3)
Chloride: 108 mmol/L (ref 98–111)
Creatinine, Ser: 1.13 mg/dL — ABNORMAL HIGH (ref 0.44–1.00)
GFR calc Af Amer: 49 mL/min — ABNORMAL LOW (ref >60)
GFR calc non Af Amer: 42 mL/min — ABNORMAL LOW (ref >60)
Glucose, Bld: 148 mg/dL — ABNORMAL HIGH (ref 70–99)
Potassium: 4.7 mmol/L (ref 3.5–5.1)
Sodium: 139 mmol/L (ref 135–145)
TOTAL PROTEIN: 6.2 g/dL — AB (ref 6.5–8.1)
Total Bilirubin: 0.6 mg/dL (ref 0.3–1.2)

## 2018-08-09 LAB — CBC WITH DIFFERENTIAL/PLATELET
Abs Immature Granulocytes: 0.01 10*3/uL (ref 0.00–0.07)
Basophils Absolute: 0.1 10*3/uL (ref 0.0–0.1)
Basophils Relative: 1 %
Eosinophils Absolute: 0.4 10*3/uL (ref 0.0–0.5)
Eosinophils Relative: 8 %
HCT: 36.1 % (ref 36.0–46.0)
Hemoglobin: 11.3 g/dL — ABNORMAL LOW (ref 12.0–15.0)
Immature Granulocytes: 0 %
Lymphocytes Relative: 15 %
Lymphs Abs: 0.7 10*3/uL (ref 0.7–4.0)
MCH: 31.1 pg (ref 26.0–34.0)
MCHC: 31.3 g/dL (ref 30.0–36.0)
MCV: 99.4 fL (ref 80.0–100.0)
MONOS PCT: 9 %
Monocytes Absolute: 0.4 10*3/uL (ref 0.1–1.0)
NEUTROS PCT: 67 %
Neutro Abs: 3.1 10*3/uL (ref 1.7–7.7)
Platelets: 192 10*3/uL (ref 150–400)
RBC: 3.63 MIL/uL — AB (ref 3.87–5.11)
RDW: 13 % (ref 11.5–15.5)
WBC: 4.6 10*3/uL (ref 4.0–10.5)
nRBC: 0 % (ref 0.0–0.2)

## 2018-08-09 MED ORDER — SODIUM CHLORIDE 0.9% FLUSH
10.0000 mL | INTRAVENOUS | Status: DC | PRN
  Administered 2018-08-09: 10 mL
  Filled 2018-08-09: qty 10

## 2018-08-09 MED ORDER — HEPARIN SOD (PORK) LOCK FLUSH 100 UNIT/ML IV SOLN
500.0000 [IU] | Freq: Once | INTRAVENOUS | Status: AC | PRN
  Administered 2018-08-09: 500 [IU]

## 2018-08-09 MED ORDER — TRASTUZUMAB CHEMO 150 MG IV SOLR
500.0000 mg | Freq: Once | INTRAVENOUS | Status: AC
  Administered 2018-08-09: 500 mg via INTRAVENOUS
  Filled 2018-08-09: qty 23.81

## 2018-08-09 MED ORDER — SODIUM CHLORIDE 0.9 % IV SOLN
Freq: Once | INTRAVENOUS | Status: AC
  Administered 2018-08-09: 10:00:00 via INTRAVENOUS

## 2018-08-09 MED ORDER — ACETAMINOPHEN 325 MG PO TABS
650.0000 mg | ORAL_TABLET | Freq: Once | ORAL | Status: AC
  Administered 2018-08-09: 650 mg via ORAL

## 2018-08-09 MED ORDER — DIPHENHYDRAMINE HCL 25 MG PO CAPS
25.0000 mg | ORAL_CAPSULE | Freq: Once | ORAL | Status: AC
  Administered 2018-08-09: 25 mg via ORAL

## 2018-08-09 NOTE — Patient Instructions (Signed)
[facility] at [facility] Discharge Instructions Follow up in 3 weeks with labs and treatment.    Thank you for choosing [facility] at [facility] to provide your oncology and hematology care.  To afford each patient quality time with our provider, please arrive at least 15 minutes before your scheduled appointment time.   If you have a lab appointment with the Cancer Center please come in thru the  Main Entrance and check in at the main information desk  You need to re-schedule your appointment should you arrive 10 or more minutes late.  We strive to give you quality time with our providers, and arriving late affects you and other patients whose appointments are after yours.  Also, if you no show three or more times for appointments you may be dismissed from the clinic at the providers discretion.     Again, thank you for choosing [facility].  Our hope is that these requests will decrease the amount of time that you wait before being seen by our physicians.       _____________________________________________________________  Should you have questions after your visit to [facility], please contact our office at [phone] between the hours of 8:00 a.m. and 4:30 p.m.  Voicemails left after 4:00 p.m. will not be returned until the following business day.  For prescription refill requests, have your pharmacy contact our office and allow 72 hours.    Cancer Center Support Programs:   > Cancer Support Group  2nd Tuesday of the month 1pm-2pm, Journey Room    

## 2018-08-09 NOTE — Patient Instructions (Signed)
[facility] Discharge Instructions for Patients Receiving Chemotherapy   Beginning January 23rd 2017 lab work for the Cancer Center will be done in the  Main lab at [facility] on 1st floor. If you have a lab appointment with the Cancer Center please come in thru the  Main Entrance and check in at the main information desk   Today you received the following chemotherapy agents Herceptin  To help prevent nausea and vomiting after your treatment, we encourage you to take your nausea medication   If you develop nausea and vomiting, or diarrhea that is not controlled by your medication, call the clinic.  The clinic phone number is [phone]. Office hours are Monday-Friday 8:30am-5:00pm.  BELOW ARE SYMPTOMS THAT SHOULD BE REPORTED IMMEDIATELY:  *FEVER GREATER THAN 101.0 F  *CHILLS WITH OR WITHOUT FEVER  NAUSEA AND VOMITING THAT IS NOT CONTROLLED WITH YOUR NAUSEA MEDICATION  *UNUSUAL SHORTNESS OF BREATH  *UNUSUAL BRUISING OR BLEEDING  TENDERNESS IN MOUTH AND THROAT WITH OR WITHOUT PRESENCE OF ULCERS  *URINARY PROBLEMS  *BOWEL PROBLEMS  UNUSUAL RASH Items with * indicate a potential emergency and should be followed up as soon as possible. If you have an emergency after office hours please contact your primary care physician or go to the nearest emergency department.  Please call the clinic during office hours if you have any questions or concerns.   You may also contact the Patient Navigator at [phone] should you have any questions or need assistance in obtaining follow up care.      Resources For Cancer Patients and their Caregivers ? American Cancer Society: Can assist with transportation, wigs, general needs, runs Look Good Feel Better.        1-888-227-6333 ? Cancer Care: Provides financial assistance, online support groups, medication/co-pay assistance.  1-800-813-HOPE (4673) ? [facility] Assists [place]  cancer patients and their families through emotional , educational and financial support.  336-427-4357 ? [place] DSS Where to apply for food stamps, Medicaid and utility assistance. 336-342-1394 ? [facility]: Transportation to medical appointments. 336-347-2287 ? Social Security Administration: May apply for disability if have a Stage IV cancer. 336-342-7796 1-800-772-1213 ? [place] Aging, Disability and Transit Services: Assists with nutrition, care and transit needs. 336-349-2343          

## 2018-08-09 NOTE — Assessment & Plan Note (Signed)
1.  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  No lymph node sampling was done. - In July 2019, she noticed a lump at her prior lumpectomy site, which was painful.  She did not want to have mammogram done. -Biopsy of this lump on 03/28/2018 by Dr. Arnoldo Morale showed invasive ductal carcinoma with calcifications (2.2 cm), ER negative, PR negative, Ki-67 40%, HER-2 3+ positive by IHC.  3/3 lymph nodes positive. - She underwent left modified radical mastectomy on 05/28/2018. -I have reviewed PET CT scan dated 07/23/2018 which showed few scattered mildly hypermetabolic pulmonary nodules as well as hypermetabolic left axillary, right paratracheal, right hilar, subcarinal adenopathy compatible with metastatic disease in the chest. - We talked about further management including best supportive care versus trying single agent Herceptin.  She is continuing to be very active and does dancing on Thursday nights.  She lives independently at home. - She has a port placed yesterday. -We discussed the results of the echocardiogram dated 08/03/2018.  It shows EF of 65 to 70%. - We talked about the side effects of Herceptin in detail including but not limited to diarrhea and rare chance of CHF.  She understands and gives [place] permission to proceed with treatment.  She will receive her first dose today.  She was told to use Imodium as needed. - I plan to add lapatinib during cycle 2.  I will see her back in 3 weeks for follow-up.

## 2018-08-09 NOTE — Progress Notes (Signed)
1005 labs reviewed and patient seen by Dr. Delton Coombes who approved pt for first treatment Herceptin today. Educational information including what Herceptin is and how it works, side effects and side effect management, when to call the doctor explained to patient. Infection prevention and other information from chemo packet also explained to patient. Consent obtained. Pt verbalized she has no questions right now, that she has thoroughly read through her educational information she was given. CXR reviewed for verification of proper port placement. ECHO from 08/03/18 reviewed, EF 65-70%.  Carol Duarte tolerated Herceptin without incident or complaint. VSS upon completion of treatment. Discharged self ambulatory in satisfactory condition in presence of daughter.

## 2018-08-09 NOTE — Progress Notes (Signed)
Carol Duarte, [place] [address]   CLINIC:  Medical Oncology/Hematology  PCP:  [name], MD [place] [address] [phone]   REASON FOR VISIT: Follow-up for recurrecnt metastatic breast cancer, ER-/PR-/HER2+  CURRENT THERAPY: Herceptin every 3 weeks  BRIEF ONCOLOGIC HISTORY:    Invasive ductal carcinoma of left breast ([place])   03/30/2017 Initial Diagnosis    Invasive ductal carcinoma of left breast ([place])    08/09/2018 -  Chemotherapy    The patient had trastuzumab (HERCEPTIN) 500 mg in sodium chloride 0.9 % 250 mL chemo infusion, 525 mg, Intravenous,  Once, 1 of 6 cycles Administration: 500 mg (08/09/2018)  for chemotherapy treatment.       INTERVAL HISTORY:  [name] 84 y.o. female returns for routine follow-up for recurrecnt metastatic breast cancer. She is here today with her daughter. She is doing well and ready for her first treatment. She has no complaints at this time. She denies any nausea or vomiting. Denies any bleeding or easy bruising. Denies any fevers or recent infections. Denies any headaches. Denies any new pain or lumps. She reports her appetite and energy level at 100%. She has no problem maintaining her weight.    REVIEW OF SYSTEMS:  Review of Systems  All other systems reviewed and are negative.    PAST MEDICAL/SURGICAL HISTORY:  Past Medical History:  Diagnosis Date  . Cancer ([place])    left breast  . History of kidney stones   . Hypercholesteremia   . Hypertension    Past Surgical History:  Procedure Laterality Date  . APPENDECTOMY    . brain cyst removed  2011  . BREAST BIOPSY Left 03/28/2018   Procedure: BREAST BIOPSY;  Surgeon: [name], MD;  Location: [facility];  Service: General;  Laterality: Left;  . CATARACT EXTRACTION W/PHACO Left 11/09/2015   Procedure: CATARACT EXTRACTION PHACO AND INTRAOCULAR LENS PLACEMENT LEFT EYE cde=8.97;  Surgeon: [name], MD;  Location: [facility];  Service: Ophthalmology;  Laterality: Left;  . EYE SURGERY     KPE left  . MASTECTOMY MODIFIED RADICAL Left 05/28/2018   Procedure: LEFT MODIFIED RADICAL MASTECTOMY;  Surgeon: [name], MD;  Location: [facility];  Service: General;  Laterality: Left;  Marland Kitchen MASTECTOMY, PARTIAL Left 04/24/2017   Procedure: MASTECTOMY PARTIAL;  Surgeon: [name], MD;  Location: [facility];  Service: General;  Laterality: Left;  . TONSILLECTOMY    . TONSILLECTOMY AND ADENOIDECTOMY       SOCIAL HISTORY:  Social History   Socioeconomic History  . Marital status: Widowed    Spouse name: Not on file  . Number of children: Not on file  . Years of education: Not on file  . Highest education level: Not on file  Occupational History  . Not on file  Social Needs  . Financial resource strain: Not hard at all  . Food insecurity:    Worry: Never true    Inability: Never true  . Transportation needs:    Medical: No    Non-medical: No  Tobacco Use  . Smoking status: Never Smoker  . Smokeless tobacco: Never Used  Substance and Sexual Activity  . Alcohol use: No  . Drug use: No  . Sexual activity: Not Currently    Partners: Male  Lifestyle  . Physical activity:    Days per week: Patient refused    Minutes per session: Patient refused  . Stress: Not at all  Relationships  .  Social connections:    Talks on phone: Patient refused    Gets together: Patient refused    Attends religious service: Patient refused    Active member of club or organization: Patient refused    Attends meetings of clubs or organizations: Patient refused    Relationship status: Patient refused  . Intimate partner violence:    Fear of current or ex partner: Patient refused    Emotionally abused: Patient refused    Physically abused: Patient refused    Forced sexual activity: Patient refused  Other Topics Concern  . Not on file  Social History Narrative  . Not on file    FAMILY HISTORY:  Family History  Problem  Relation Age of Onset  . Heart failure Mother   . Heart failure Father     CURRENT MEDICATIONS:  Outpatient Encounter Medications as of 08/09/2018  Medication Sig  . amLODipine (NORVASC) 5 MG tablet Take 5 mg by mouth daily.  Marland Kitchen aspirin EC 81 MG tablet Take 1 tablet (81 mg total) by mouth daily.  . ferrous sulfate 325 (65 FE) MG tablet Take 325 mg by mouth daily with breakfast.  . labetalol (NORMODYNE) 100 MG tablet Take 1 tablet (100 mg total) by mouth 2 (two) times daily.  . naproxen sodium (ALEVE) 220 MG tablet Take 220 mg by mouth daily as needed (for headache or pain).  Marland Kitchen oxybutynin (DITROPAN) 5 MG tablet Take 5 mg by mouth daily.   . prochlorperazine (COMPAZINE) 10 MG tablet Take 1 tablet (10 mg total) by mouth every 6 (six) hours as needed (Nausea or vomiting).  . simvastatin (ZOCOR) 40 MG tablet Take 40 mg by mouth daily.  . Trastuzumab (HERCEPTIN IV) Inject 525 mg into the vein every 21 ( twenty-one) days.  . [DISCONTINUED] lidocaine-prilocaine (EMLA) cream Apply small amount over port site one hour prior to appointment (cover with plastic wrap) (Patient not taking: Reported on 07/31/2018)  . [DISCONTINUED] LORazepam (ATIVAN) 1 MG tablet Take 1 tablet (1 mg total) by mouth every 8 (eight) hours. (Patient not taking: Reported on 07/31/2018)   No facility-administered encounter medications on file as of 08/09/2018.     ALLERGIES:  No Known Allergies   PHYSICAL EXAM:  ECOG Performance status: 1  VITAL SIGNS:BP:151/64, P:54,R:18,T:98.0,O2:99% WEIGHT: 140.2  Physical Exam Constitutional:      Appearance: Normal appearance. She is normal weight.  Musculoskeletal: Normal range of motion.  Skin:    General: Skin is warm and dry.  Neurological:     Mental Status: She is alert and oriented to person, place, and time. Mental status is at baseline.  Psychiatric:        Mood and Affect: Mood normal.        Behavior: Behavior normal.        Thought Content: Thought content normal.         Judgment: Judgment normal.      LABORATORY DATA:  I have reviewed the labs as listed.  CBC    Component Value Date/Time   WBC 4.6 08/09/2018 0825   RBC 3.63 (L) 08/09/2018 0825   HGB 11.3 (L) 08/09/2018 0825   HCT 36.1 08/09/2018 0825   PLT 192 08/09/2018 0825   MCV 99.4 08/09/2018 0825   MCH 31.1 08/09/2018 0825   MCHC 31.3 08/09/2018 0825   RDW 13.0 08/09/2018 0825   LYMPHSABS 0.7 08/09/2018 0825   MONOABS 0.4 08/09/2018 0825   EOSABS 0.4 08/09/2018 0825   BASOSABS 0.1 08/09/2018 0825  CMP Latest Ref Rng & Units 08/09/2018 07/25/2018 05/29/2018  Glucose 70 - 99 mg/dL 148(H) 122(H) 122(H)  BUN 8 - 23 mg/dL 20 28(H) 23  Creatinine 0.44 - 1.00 mg/dL 1.13(H) 1.23(H) 1.02(H)  Sodium 135 - 145 mmol/L 139 140 140  Potassium 3.5 - 5.1 mmol/L 4.7 4.6 4.1  Chloride 98 - 111 mmol/L 108 110 110  CO2 22 - 32 mmol/L _0 Calcium 8.9 - 10.3 mg/dL 8.9 8.8(L) 8.6(L)  Total Protein 6.5 - 8.1 g/dL 6.2(L) 6.3(L) -  Total Bilirubin 0.3 - 1.2 mg/dL 0.6 0.7 -  Alkaline Phos 38 - 126 U/L 45 45 -  AST 15 - 41 U/L 17 16 -  ALT 0 - 44 U/L 12 15 -       DIAGNOSTIC IMAGING:  I have independently reviewed the scans and discussed with the patient.   I have reviewed Francene Finders, NP's note and agree with the documentation.  I personally performed a face-to-face visit, made revisions and my assessment and plan is as follows.    ASSESSMENT & PLAN:   Invasive ductal carcinoma of left breast ([facility]) 1.  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  No lymph node sampling was done. - In July 2019, she noticed a lump at her prior lumpectomy site, which was painful.  She did not want to have  mammogram done. -Biopsy of this lump on 03/28/2018 by Dr. Arnoldo Morale showed invasive ductal carcinoma with calcifications (2.2 cm), ER negative, PR negative, Ki-67 40%, HER-2 3+ positive by IHC.  3/3 lymph nodes positive. - She underwent left modified radical mastectomy on 05/28/2018. -I have reviewed PET CT scan dated 07/23/2018 which showed few scattered mildly hypermetabolic pulmonary nodules as well as hypermetabolic left axillary, right paratracheal, right hilar, subcarinal adenopathy compatible with metastatic disease in the chest. - We talked about further management including best supportive care versus trying single agent Herceptin.  She is continuing to be very active and does dancing on Thursday nights.  She lives independently at home. - She has a port placed yesterday. -We discussed the results of the echocardiogram dated 08/03/2018.  It shows EF of 65 to 70%. - We talked about the side effects of Herceptin in detail including but not limited to diarrhea and rare chance of CHF.  She understands and gives [place] permission to proceed with treatment.  She will receive her first dose today.  She was told to use Imodium as needed. - I plan to add lapatinib during cycle 2.  I will see her back in 3 weeks for follow-up.      Orders placed this encounter:  Orders Placed This Encounter  Procedures  . CBC with Differential/Platelet  . Comprehensive metabolic panel  . CBC with Differential/Platelet  . Comprehensive metabolic panel      Derek Jack, MD [place] [phone]

## 2018-08-10 ENCOUNTER — Encounter (HOSPITAL_COMMUNITY): Payer: Self-pay | Admitting: General Surgery

## 2018-08-10 ENCOUNTER — Telehealth (HOSPITAL_COMMUNITY): Payer: Self-pay

## 2018-08-10 LAB — CANCER ANTIGEN 27.29: CA 27.29: 13.3 U/mL (ref 0.0–38.6)

## 2018-08-10 LAB — CANCER ANTIGEN 15-3: CA 15-3: 6.2 U/mL (ref 0.0–25.0)

## 2018-08-10 NOTE — Telephone Encounter (Signed)
24 hr Chemo F/U call: Spoke with pt who reported doing well but has noticed an increase in diarrhea and fatigue today. Pt was getting ready to take a dose of Imodium at this time. Pt encouraged to go ahead and take 2 tablets of Imodium since this is her first dose then continue with 1 tab after each loose stool and no more than 8 per day. Pt is eating and drinking OK. Pt instructed to call us or go to ER if her diarrhea is not controlled with the Imodium or any other questions or problems. Pt verbalized understanding

## 2018-08-30 ENCOUNTER — Ambulatory Visit (HOSPITAL_COMMUNITY): Payer: Medicare Other

## 2018-08-30 ENCOUNTER — Other Ambulatory Visit (HOSPITAL_COMMUNITY): Payer: Medicare Other

## 2018-08-30 ENCOUNTER — Ambulatory Visit (HOSPITAL_COMMUNITY): Payer: Medicare Other | Admitting: Hematology

## 2018-08-30 NOTE — Progress Notes (Deleted)
[place], [place] [address]   CLINIC:  Medical Oncology/Hematology  PCP:  [name], MD [place] [address] [phone]   REASON FOR VISIT: Follow-up for recurrecnt metastatic breast cancer, ER-/PR-/HER2+  CURRENT THERAPY: Herceptin every 3 weeks  BRIEF ONCOLOGIC HISTORY:    Invasive ductal carcinoma of left breast ([facility])   03/30/2017 Initial Diagnosis    Invasive ductal carcinoma of left breast ([facility])    08/09/2018 -  Chemotherapy    The patient had trastuzumab (HERCEPTIN) 500 mg in sodium chloride 0.9 % 250 mL chemo infusion, 525 mg, Intravenous,  Once, 1 of 6 cycles Administration: 500 mg (08/09/2018)  for chemotherapy treatment.       INTERVAL HISTORY:  Carol Duarte 85 y.o. female returns for routine follow-up for recurrent breast cancer.    REVIEW OF SYSTEMS:  Review of Systems - Oncology   PAST MEDICAL/SURGICAL HISTORY:  Past Medical History:  Diagnosis Date  . Cancer ([facility])    left breast  . History of kidney stones   . Hypercholesteremia   . Hypertension    Past Surgical History:  Procedure Laterality Date  . APPENDECTOMY    . brain cyst removed  2011  . BREAST BIOPSY Left 03/28/2018   Procedure: BREAST BIOPSY;  Surgeon: [name], MD;  Location: [facility];  Service: General;  Laterality: Left;  . CATARACT EXTRACTION W/PHACO Left 11/09/2015   Procedure: CATARACT EXTRACTION PHACO AND INTRAOCULAR LENS PLACEMENT LEFT EYE cde=8.97;  Surgeon: [name], MD;  Location: [facility];  Service: Ophthalmology;  Laterality: Left;  . EYE SURGERY     KPE left  . MASTECTOMY MODIFIED RADICAL Left 05/28/2018   Procedure: LEFT MODIFIED RADICAL MASTECTOMY;  Surgeon: [name], MD;  Location: [facility];  Service: General;  Laterality: Left;  Marland Kitchen MASTECTOMY, PARTIAL Left 04/24/2017   Procedure: MASTECTOMY PARTIAL;  Surgeon: [name], MD;  Location: [facility];  Service: General;  Laterality: Left;  . PORTACATH  PLACEMENT Right 08/08/2018   Procedure: INSERTION PORT-A-CATH;  Surgeon: [name], MD;  Location: [facility];  Service: General;  Laterality: Right;  . TONSILLECTOMY    . TONSILLECTOMY AND ADENOIDECTOMY       SOCIAL HISTORY:  Social History   Socioeconomic History  . Marital status: Widowed    Spouse name: Not on file  . Number of children: Not on file  . Years of education: Not on file  . Highest education level: Not on file  Occupational History  . Not on file  Social Needs  . Financial resource strain: Not hard at all  . Food insecurity:    Worry: Never true    Inability: Never true  . Transportation needs:    Medical: No    Non-medical: No  Tobacco Use  . Smoking status: Never Smoker  . Smokeless tobacco: Never Used  Substance and Sexual Activity  . Alcohol use: No  . Drug use: No  . Sexual activity: Not Currently    Partners: Male  Lifestyle  . Physical activity:    Days per week: Patient refused    Minutes per session: Patient refused  . Stress: Not at all  Relationships  . Social connections:    Talks on phone: Patient refused    Gets together: Patient refused    Attends religious service: Patient refused    Active member of club or organization: Patient refused    Attends meetings of clubs or organizations: Patient refused  Relationship status: Patient refused  . Intimate partner violence:    Fear of current or ex partner: Patient refused    Emotionally abused: Patient refused    Physically abused: Patient refused    Forced sexual activity: Patient refused  Other Topics Concern  . Not on file  Social History Narrative  . Not on file    FAMILY HISTORY:  Family History  Problem Relation Age of Onset  . Heart failure Mother   . Heart failure Father     CURRENT MEDICATIONS:  Outpatient Encounter Medications as of 08/30/2018  Medication Sig  . amLODipine (NORVASC) 5 MG tablet Take 5 mg by mouth daily.  Marland Kitchen aspirin EC 81 MG tablet Take 1 tablet  (81 mg total) by mouth daily.  . ferrous sulfate 325 (65 FE) MG tablet Take 325 mg by mouth daily with breakfast.  . labetalol (NORMODYNE) 100 MG tablet Take 1 tablet (100 mg total) by mouth 2 (two) times daily.  . naproxen sodium (ALEVE) 220 MG tablet Take 220 mg by mouth daily as needed (for headache or pain).  Marland Kitchen oxybutynin (DITROPAN) 5 MG tablet Take 5 mg by mouth daily.   . prochlorperazine (COMPAZINE) 10 MG tablet Take 1 tablet (10 mg total) by mouth every 6 (six) hours as needed (Nausea or vomiting).  . simvastatin (ZOCOR) 40 MG tablet Take 40 mg by mouth daily.  . Trastuzumab (HERCEPTIN IV) Inject 525 mg into the vein every 21 ( twenty-one) days.   No facility-administered encounter medications on file as of 08/30/2018.     ALLERGIES:  No Known Allergies   PHYSICAL EXAM:  ECOG Performance status: ***  There were no vitals filed for this visit. There were no vitals filed for this visit.  Physical Exam   LABORATORY DATA:  I have reviewed the labs as listed.  CBC    Component Value Date/Time   WBC 4.6 08/09/2018 0825   RBC 3.63 (L) 08/09/2018 0825   HGB 11.3 (L) 08/09/2018 0825   HCT 36.1 08/09/2018 0825   PLT 192 08/09/2018 0825   MCV 99.4 08/09/2018 0825   MCH 31.1 08/09/2018 0825   MCHC 31.3 08/09/2018 0825   RDW 13.0 08/09/2018 0825   LYMPHSABS 0.7 08/09/2018 0825   MONOABS 0.4 08/09/2018 0825   EOSABS 0.4 08/09/2018 0825   BASOSABS 0.1 08/09/2018 0825   CMP Latest Ref Rng & Units 08/09/2018 07/25/2018 05/29/2018  Glucose 70 - 99 mg/dL 148(H) 122(H) 122(H)  BUN 8 - 23 mg/dL 20 28(H) 23  Creatinine 0.44 - 1.00 mg/dL 1.13(H) 1.23(H) 1.02(H)  Sodium 135 - 145 mmol/L 139 140 140  Potassium 3.5 - 5.1 mmol/L 4.7 4.6 4.1  Chloride 98 - 111 mmol/L 108 110 110  CO2 22 - 32 mmol/L '24 23 24  ' Calcium 8.9 - 10.3 mg/dL 8.9 8.8(L) 8.6(L)  Total Protein 6.5 - 8.1 g/dL 6.2(L) 6.3(L) -  Total Bilirubin 0.3 - 1.2 mg/dL 0.6 0.7 -  Alkaline Phos 38 - 126 U/L 45 45 -  AST 15 -  41 U/L 17 16 -  ALT 0 - 44 U/L 12 15 -       DIAGNOSTIC IMAGING:  I have independently reviewed the scans and discussed with the patient.   I have reviewed Francene Finders, NP's note and agree with the documentation.  I personally performed a face-to-face visit, made revisions and my assessment and plan is as follows.    ASSESSMENT & PLAN:   No problem-specific Assessment & Plan notes  found for this encounter.      Orders placed this encounter:  No orders of the defined types were placed in this encounter.     Derek Jack, MD [place] [phone]

## 2018-09-06 ENCOUNTER — Inpatient Hospital Stay (HOSPITAL_BASED_OUTPATIENT_CLINIC_OR_DEPARTMENT_OTHER): Payer: Medicare Other | Admitting: Genetic Counselor

## 2018-09-06 ENCOUNTER — Encounter (HOSPITAL_COMMUNITY): Payer: Medicare Other | Admitting: Genetic Counselor

## 2018-09-06 ENCOUNTER — Inpatient Hospital Stay (HOSPITAL_COMMUNITY): Payer: Medicare Other

## 2018-09-06 ENCOUNTER — Other Ambulatory Visit (HOSPITAL_COMMUNITY): Payer: Medicare Other

## 2018-09-06 ENCOUNTER — Encounter (HOSPITAL_COMMUNITY): Payer: Self-pay

## 2018-09-06 ENCOUNTER — Inpatient Hospital Stay (HOSPITAL_COMMUNITY): Payer: Medicare Other | Attending: Hematology

## 2018-09-06 VITALS — BP 131/46 | HR 58 | Temp 97.5°F | Resp 18 | Wt 138.8 lb

## 2018-09-06 DIAGNOSIS — R5383 Other fatigue: Secondary | ICD-10-CM | POA: Diagnosis not present

## 2018-09-06 DIAGNOSIS — C50512 Malignant neoplasm of lower-outer quadrant of left female breast: Secondary | ICD-10-CM

## 2018-09-06 DIAGNOSIS — Z79899 Other long term (current) drug therapy: Secondary | ICD-10-CM | POA: Insufficient documentation

## 2018-09-06 DIAGNOSIS — Z171 Estrogen receptor negative status [ER-]: Secondary | ICD-10-CM | POA: Diagnosis not present

## 2018-09-06 DIAGNOSIS — Z5112 Encounter for antineoplastic immunotherapy: Secondary | ICD-10-CM | POA: Insufficient documentation

## 2018-09-06 DIAGNOSIS — Z7982 Long term (current) use of aspirin: Secondary | ICD-10-CM | POA: Diagnosis not present

## 2018-09-06 DIAGNOSIS — I10 Essential (primary) hypertension: Secondary | ICD-10-CM | POA: Diagnosis not present

## 2018-09-06 DIAGNOSIS — Z170 Estrogen receptor positive status [ER+]: Secondary | ICD-10-CM

## 2018-09-06 DIAGNOSIS — Z8249 Family history of ischemic heart disease and other diseases of the circulatory system: Secondary | ICD-10-CM | POA: Insufficient documentation

## 2018-09-06 DIAGNOSIS — C50912 Malignant neoplasm of unspecified site of left female breast: Secondary | ICD-10-CM | POA: Diagnosis not present

## 2018-09-06 DIAGNOSIS — R197 Diarrhea, unspecified: Secondary | ICD-10-CM | POA: Diagnosis not present

## 2018-09-06 LAB — CBC WITH DIFFERENTIAL/PLATELET
Abs Immature Granulocytes: 0.01 10*3/uL (ref 0.00–0.07)
BASOS PCT: 1 %
Basophils Absolute: 0 10*3/uL (ref 0.0–0.1)
EOS PCT: 7 %
Eosinophils Absolute: 0.3 10*3/uL (ref 0.0–0.5)
HCT: 34.6 % — ABNORMAL LOW (ref 36.0–46.0)
Hemoglobin: 10.6 g/dL — ABNORMAL LOW (ref 12.0–15.0)
Immature Granulocytes: 0 %
Lymphocytes Relative: 19 %
Lymphs Abs: 0.8 10*3/uL (ref 0.7–4.0)
MCH: 30.2 pg (ref 26.0–34.0)
MCHC: 30.6 g/dL (ref 30.0–36.0)
MCV: 98.6 fL (ref 80.0–100.0)
Monocytes Absolute: 0.4 10*3/uL (ref 0.1–1.0)
Monocytes Relative: 9 %
Neutro Abs: 2.6 10*3/uL (ref 1.7–7.7)
Neutrophils Relative %: 64 %
Platelets: 181 10*3/uL (ref 150–400)
RBC: 3.51 MIL/uL — ABNORMAL LOW (ref 3.87–5.11)
RDW: 13.1 % (ref 11.5–15.5)
WBC: 4 10*3/uL (ref 4.0–10.5)
nRBC: 0 % (ref 0.0–0.2)

## 2018-09-06 LAB — COMPREHENSIVE METABOLIC PANEL
ALT: 17 U/L (ref 0–44)
AST: 19 U/L (ref 15–41)
Albumin: 3.7 g/dL (ref 3.5–5.0)
Alkaline Phosphatase: 48 U/L (ref 38–126)
Anion gap: 9 (ref 5–15)
BUN: 27 mg/dL — ABNORMAL HIGH (ref 8–23)
CHLORIDE: 108 mmol/L (ref 98–111)
CO2: 23 mmol/L (ref 22–32)
Calcium: 8.7 mg/dL — ABNORMAL LOW (ref 8.9–10.3)
Creatinine, Ser: 1.1 mg/dL — ABNORMAL HIGH (ref 0.44–1.00)
GFR calc Af Amer: 51 mL/min — ABNORMAL LOW (ref >60)
GFR calc non Af Amer: 44 mL/min — ABNORMAL LOW (ref >60)
Glucose, Bld: 97 mg/dL (ref 70–99)
Potassium: 4.8 mmol/L (ref 3.5–5.1)
Sodium: 140 mmol/L (ref 135–145)
Total Bilirubin: 0.6 mg/dL (ref 0.3–1.2)
Total Protein: 6.1 g/dL — ABNORMAL LOW (ref 6.5–8.1)

## 2018-09-06 MED ORDER — TRASTUZUMAB CHEMO 150 MG IV SOLR
350.0000 mg | Freq: Once | INTRAVENOUS | Status: AC
  Administered 2018-09-06: 350 mg via INTRAVENOUS
  Filled 2018-09-06: qty 16.67

## 2018-09-06 MED ORDER — ACETAMINOPHEN 325 MG PO TABS
ORAL_TABLET | ORAL | Status: AC
  Filled 2018-09-06: qty 2

## 2018-09-06 MED ORDER — DIPHENHYDRAMINE HCL 25 MG PO CAPS
ORAL_CAPSULE | ORAL | Status: AC
  Filled 2018-09-06: qty 2

## 2018-09-06 MED ORDER — DIPHENHYDRAMINE HCL 25 MG PO CAPS
25.0000 mg | ORAL_CAPSULE | Freq: Once | ORAL | Status: AC
  Administered 2018-09-06: 25 mg via ORAL

## 2018-09-06 MED ORDER — SODIUM CHLORIDE 0.9 % IV SOLN
Freq: Once | INTRAVENOUS | Status: AC
  Administered 2018-09-06: 14:00:00 via INTRAVENOUS

## 2018-09-06 MED ORDER — SODIUM CHLORIDE 0.9% FLUSH
10.0000 mL | INTRAVENOUS | Status: DC | PRN
  Administered 2018-09-06: 10 mL
  Filled 2018-09-06: qty 10

## 2018-09-06 MED ORDER — HEPARIN SOD (PORK) LOCK FLUSH 100 UNIT/ML IV SOLN
500.0000 [IU] | Freq: Once | INTRAVENOUS | Status: AC | PRN
  Administered 2018-09-06: 500 [IU]

## 2018-09-06 MED ORDER — ACETAMINOPHEN 325 MG PO TABS
650.0000 mg | ORAL_TABLET | Freq: Once | ORAL | Status: AC
  Administered 2018-09-06: 650 mg via ORAL

## 2018-09-06 NOTE — Progress Notes (Signed)
Carol Duarte tolerated Herceptin infusion well without complaints or incident. Labs reviewed with Dr. Delton Coombes prior to administering this medication. VSS upon discharge. Pt discharged self ambulatory in satisfactory condition

## 2018-09-06 NOTE — Patient Instructions (Signed)
[facility] Discharge Instructions for Patients Receiving Chemotherapy   Beginning January 23rd 2017 lab work for the Cancer Center will be done in the  Main lab at [facility] on 1st floor. If you have a lab appointment with the Cancer Center please come in thru the  Main Entrance and check in at the main information desk   Today you received the following chemotherapy agents Herceptin. Follow-up as scheduled. Call clinic for any questions or concerns  To help prevent nausea and vomiting after your treatment, we encourage you to take your nausea medication.   If you develop nausea and vomiting, or diarrhea that is not controlled by your medication, call the clinic.  The clinic phone number is [phone]. Office hours are Monday-Friday 8:30am-5:00pm.  BELOW ARE SYMPTOMS THAT SHOULD BE REPORTED IMMEDIATELY:  *FEVER GREATER THAN 101.0 F  *CHILLS WITH OR WITHOUT FEVER  NAUSEA AND VOMITING THAT IS NOT CONTROLLED WITH YOUR NAUSEA MEDICATION  *UNUSUAL SHORTNESS OF BREATH  *UNUSUAL BRUISING OR BLEEDING  TENDERNESS IN MOUTH AND THROAT WITH OR WITHOUT PRESENCE OF ULCERS  *URINARY PROBLEMS  *BOWEL PROBLEMS  UNUSUAL RASH Items with * indicate a potential emergency and should be followed up as soon as possible. If you have an emergency after office hours please contact your primary care physician or go to the nearest emergency department.  Please call the clinic during office hours if you have any questions or concerns.   You may also contact the Patient Navigator at [phone] should you have any questions or need assistance in obtaining follow up care.      Resources For Cancer Patients and their Caregivers ? American Cancer Society: Can assist with transportation, wigs, general needs, runs Look Good Feel Better.        1-888-227-6333 ? Cancer Care: Provides financial assistance, online support groups, medication/co-pay assistance.  1-800-813-HOPE  (4673) ? [facility] Assists [place] cancer patients and their families through emotional , educational and financial support.  336-427-4357 ? [place] DSS Where to apply for food stamps, Medicaid and utility assistance. 336-342-1394 ? [facility]: Transportation to medical appointments. 336-347-2287 ? Social Security Administration: May apply for disability if have a Stage IV cancer. 336-342-7796 1-800-772-1213 ? [place] [facility]ging, Disability and Transit Services: Assists with nutrition, care and transit needs. 336-349-2343         

## 2018-09-10 ENCOUNTER — Encounter (HOSPITAL_COMMUNITY): Payer: Self-pay | Admitting: Genetic Counselor

## 2018-09-11 NOTE — Progress Notes (Signed)
REFERRING PROVIDER: Derek Jack, MD [address] [place], [place] [address]  PRIMARY PROVIDER:  [name], MD  PRIMARY REASON FOR VISIT:  1. Malignant neoplasm of lower-outer quadrant of left breast of female, estrogen receptor positive ([place])   2. Invasive ductal carcinoma of left breast ([place])      HISTORY OF PRESENT ILLNESS:   Carol Duarte, a 85 y.o. female, was seen for a [facility] cancer genetics consultation at the request of Dr. Delton Coombes due to a personal history of cancer.  Carol Duarte presents to clinic today to discuss the possibility of a hereditary predisposition to cancer, genetic testing, and to further clarify her future cancer risks, as well as potential cancer risks for family members.   In 2019, at the age of 34, Carol Duarte was diagnosed with invasive ductal carcinoma of the left breast. This was treated with lumpectomy and chemotherapy.  The breast cancer is metastatic.      CANCER HISTORY:    Invasive ductal carcinoma of left breast ([place])   03/30/2017 Initial Diagnosis    Invasive ductal carcinoma of left breast ([place])    08/09/2018 -  Chemotherapy    The patient had trastuzumab (HERCEPTIN) 500 mg in sodium chloride 0.9 % 250 mL chemo infusion, 525 mg, Intravenous,  Once, 2 of 6 cycles Administration: 500 mg (08/09/2018), 350 mg (09/06/2018)  for chemotherapy treatment.       HORMONAL RISK FACTORS:  Menarche was at age 69.  First live birth at age 74.  OCP use for approximately 0 years.  Ovaries intact: yes.  Hysterectomy: no.  Menopausal status: postmenopausal.  HRT use: 0 years. Colonoscopy: yes; normal. Mammogram within the last year: yes. Number of breast biopsies: 1. Up to date with pelvic exams:  n/a. Any excessive radiation exposure in the past:  no  Past Medical History:  Diagnosis Date  . Cancer ([place])    left breast  . History of kidney stones   . Hypercholesteremia   . Hypertension     Past Surgical History:  Procedure Laterality  Date  . APPENDECTOMY    . brain cyst removed  2011  . BREAST BIOPSY Left 03/28/2018   Procedure: BREAST BIOPSY;  Surgeon: [name], MD;  Location: [facility];  Service: General;  Laterality: Left;  . CATARACT EXTRACTION W/PHACO Left 11/09/2015   Procedure: CATARACT EXTRACTION PHACO AND INTRAOCULAR LENS PLACEMENT LEFT EYE cde=8.97;  Surgeon: [name], MD;  Location: [facility];  Service: Ophthalmology;  Laterality: Left;  . EYE SURGERY     KPE left  . MASTECTOMY MODIFIED RADICAL Left 05/28/2018   Procedure: LEFT MODIFIED RADICAL MASTECTOMY;  Surgeon: [name], MD;  Location: [facility];  Service: General;  Laterality: Left;  Marland Kitchen MASTECTOMY, PARTIAL Left 04/24/2017   Procedure: MASTECTOMY PARTIAL;  Surgeon: [name], MD;  Location: [facility];  Service: General;  Laterality: Left;  . PORTACATH PLACEMENT Right 08/08/2018   Procedure: INSERTION PORT-A-CATH;  Surgeon: [name], MD;  Location: [facility];  Service: General;  Laterality: Right;  . TONSILLECTOMY    . TONSILLECTOMY AND ADENOIDECTOMY      Social History   Socioeconomic History  . Marital status: Widowed    Spouse name: Not on file  . Number of children: Not on file  . Years of education: Not on file  . Highest education level: Not on file  Occupational History  . Not on file  Social Needs  . Financial resource strain: Not hard at all  . Food  insecurity:    Worry: Never true    Inability: Never true  . Transportation needs:    Medical: No    Non-medical: No  Tobacco Use  . Smoking status: Never Smoker  . Smokeless tobacco: Never Used  Substance and Sexual Activity  . Alcohol use: No  . Drug use: No  . Sexual activity: Not Currently    Partners: Male  Lifestyle  . Physical activity:    Days per week: Patient refused    Minutes per session: Patient refused  . Stress: Not at all  Relationships  . Social connections:    Talks on phone: Patient refused    Gets together: Patient refused    Attends religious service:  Patient refused    Active member of club or organization: Patient refused    Attends meetings of clubs or organizations: Patient refused    Relationship status: Patient refused  Other Topics Concern  . Not on file  Social History Narrative  . Not on file     FAMILY HISTORY:  We obtained a detailed, 4-generation family history.  Significant diagnoses are listed below: Family History  Problem Relation Age of Onset  . Heart failure Mother   . Heart failure Father   . Congestive Heart Failure Brother   . Tuberculosis Paternal Uncle   . Tuberculosis Maternal Grandmother   . Tuberculosis Maternal Grandfather   . Congestive Heart Failure Brother     The patient has a son and daughter who are cancer free.  She had three brothers who did not have cancer.  Her parents, aunts and uncles and grandparents are all deceased.  There is no reported family history of cancer.  [name] is unaware of previous family history of genetic testing for hereditary cancer risks. Patient's maternal ancestors are of [place] and [place] descent, and paternal ancestors are of English descent. There is no reported Ashkenazi Jewish ancestry. There is no known consanguinity.  GENETIC COUNSELING ASSESSMENT: Carol Duarte is a 85 y.o. female with a personal history of metastatic breast cancer.  She is being tested to determine the need for PARP inhibitor. We, therefore, discussed and recommended the following at today's visit.   DISCUSSION: We discussed that about 5-10% of breast cancer is hereditary with most cases due to BRCA mutations.  Carol Duarte is the only person in her family with cancer, and therefore the likelihood that we will find a hereditary mutation is low.  Due to the metastatic nature of her breast cancer, she meets medical criteria for testing to determine PARP inhibitor use. We discussed that the likelihood that we will find anything is low.  We reviewed the characteristics, features and inheritance  patterns of hereditary cancer syndromes. We also discussed genetic testing, including the appropriate family members to test, the process of testing, insurance coverage and turn-around-time for results. We discussed the implications of a negative, positive and/or variant of uncertain significant result. We recommended Carol Duarte pursue genetic testing for the Breast cancer gene panel. The Breast/GYN gene panel offered by [facility] includes sequencing and rearrangement analysis for the following 23 genes:  ATM, BARD1, BRCA1, BRCA2, BRIP1, CDH1, CHEK2, EPCAM, FANCC, MLH1, MSH2, MSH6, MUTYH, NBN, NF1, PALB2, PMS2, POLD1, PTEN, RAD51C, RAD51D, RECQL, and TP53.     Based on Carol Duarte's personal history of cancer, she meets medical criteria for genetic testing. Despite that she meets criteria, she may still have an out of pocket cost. We discussed that if her out of pocket cost  for testing is over $100, the laboratory will call and confirm whether she wants to proceed with testing.  If the out of pocket cost of testing is less than $100 she will be billed by the genetic testing laboratory.   PLAN: After considering the risks, benefits, and limitations, Carol Duarte  provided informed consent to pursue genetic testing and the blood sample was sent to [facility] for analysis of the Breast/GYN panel. Results should be available within approximately 2-3 weeks' time, at which point they will be disclosed by telephone to Carol Duarte, as will any additional recommendations warranted by these results. Carol Duarte will receive a summary of her genetic counseling visit and a copy of her results once available. This information will also be available in Epic. We encouraged Carol Duarte to remain in contact with cancer genetics annually so that we can continuously update the family history and inform her of any changes in cancer genetics and testing that may be of benefit for her family. Carol Duarte questions were answered to her  satisfaction today. Our contact information was provided should additional questions or concerns arise.  Lastly, we encouraged Carol Duarte to remain in contact with cancer genetics annually so that we can continuously update the family history and inform her of any changes in cancer genetics and testing that may be of benefit for this family.   Ms.  Duarte questions were answered to her satisfaction today. Our contact information was provided should additional questions or concerns arise. Thank you for the referral and allowing [place] to share in the care of your patient.   Zyree Traynham P. Florene Glen, [place], [facility] Certified Genetic Counselor [email or web address] phone: [phone]  The patient was seen for a total of 45 minutes in face-to-face genetic counseling.  This patient was discussed with Drs. Magrinat, Lindi Adie and/or Burr Medico who agrees with the above.    _______________________________________________________________________ For Office Staff:  Number of people involved in session: 2 Was an Intern/ student involved with case: no

## 2018-09-20 ENCOUNTER — Other Ambulatory Visit (HOSPITAL_COMMUNITY): Payer: Medicare Other

## 2018-09-20 ENCOUNTER — Encounter: Payer: Self-pay | Admitting: Genetic Counselor

## 2018-09-20 ENCOUNTER — Telehealth: Payer: Self-pay | Admitting: Genetic Counselor

## 2018-09-20 ENCOUNTER — Ambulatory Visit (HOSPITAL_COMMUNITY): Payer: Medicare Other

## 2018-09-20 DIAGNOSIS — Z1379 Encounter for other screening for genetic and chromosomal anomalies: Secondary | ICD-10-CM | POA: Insufficient documentation

## 2018-09-20 NOTE — Telephone Encounter (Signed)
Revealed negative genetic testing.  Discussed that we do not know why she has breast cancer. It could be due to a different gene that we are not testing, or maybe our current technology may not be able to pick something up.  It will be important for her to keep in contact with genetics to keep up with whether additional testing may be needed.    

## 2018-09-21 ENCOUNTER — Ambulatory Visit: Payer: Self-pay | Admitting: Genetic Counselor

## 2018-09-21 DIAGNOSIS — Z1379 Encounter for other screening for genetic and chromosomal anomalies: Secondary | ICD-10-CM

## 2018-09-21 NOTE — Progress Notes (Signed)
HPI:  [name] was previously seen in the [facility] clinic due to a personal history of cancer and concerns regarding a hereditary predisposition to cancer. Please refer to our prior cancer genetics clinic note for more information regarding [name], social and family histories, and our assessment and recommendations, at the time. [name] recent genetic test results were disclosed to her, as were recommendations warranted by these results. These results and recommendations are discussed in more detail below.  CANCER HISTORY:    Invasive ductal carcinoma of left breast ([place])   03/30/2017 Initial Diagnosis    Invasive ductal carcinoma of left breast ([place])    08/09/2018 -  Chemotherapy    The patient had trastuzumab (HERCEPTIN) 500 mg in sodium chloride 0.9 % 250 mL chemo infusion, 525 mg, Intravenous,  Once, 2 of 6 cycles Administration: 500 mg (08/09/2018), 350 mg (09/06/2018)  for chemotherapy treatment.     09/18/2018 Genetic Testing    Negative genetic testing for the breast and gyn cancer panel.  The Breast/GYN gene panel offered by [facility] includes sequencing and rearrangement analysis for the following 23 genes:  ATM, BRCA1, BRCA2, BRIP1, CDH1, CHEK2, EPCAM, MLH1, MSH2, MSH6, NBN, NF1, PALB2, PMS2, PTEN, RAD51C, RAD51D, STK11, and TP53.   The report date is 09/18/2018.     FAMILY HISTORY:  We obtained a detailed, 4-generation family history.  Significant diagnoses are listed below: Family History  Problem Relation Age of Onset  . Heart failure Mother   . Heart failure Father   . Congestive Heart Failure Brother   . Tuberculosis Paternal Uncle   . Tuberculosis Maternal Grandmother   . Tuberculosis Maternal Grandfather   . Congestive Heart Failure Brother     The patient has a son and daughter who are cancer free.  She had three brothers who did not have cancer.  Her parents, aunts and uncles and grandparents are all deceased.  There is no reported family  history of cancer.  [name] is unaware of previous family history of genetic testing for hereditary cancer risks. Patient's maternal ancestors are of [place] and [place] descent, and paternal ancestors are of English descent. There is no reported Ashkenazi Jewish ancestry. There is no known consanguinity.   GENETIC TEST RESULTS: Genetic testing reported out on September 18, 2018 through the Breast and GYN cancer panel found no deleterious mutations. The Breast/GYN gene panel offered by [facility] includes sequencing and rearrangement analysis for the following 23 genes:  ATM, BRCA1, BRCA2, BRIP1, CDH1, CHEK2, EPCAM, MLH1, MSH2, MSH6, NBN, NF1, PALB2, PMS2, PTEN, RAD51C, RAD51D, STK11, and TP53.  The test report has been scanned into EPIC and is located under the Molecular Pathology section of the Results Review tab.    We discussed with [name] that since the current genetic testing is not perfect, it is possible there may be a gene mutation in one of these genes that current testing cannot detect, but that chance is small.  We also discussed, that it is possible that another gene that has not yet been discovered, or that we have not yet tested, is responsible for the cancer diagnoses in the family, and it is, therefore, important to remain in touch with cancer genetics in the future so that we can continue to offer [name] the most up to date genetic testing.    CANCER SCREENING RECOMMENDATIONS: This result is reassuring and indicates that [name] likely does not have an increased risk for a future cancer  due to a mutation in one of these genes. This normal test also suggests that [name]'s cancer was most likely not due to an inherited predisposition associated with one of these genes.  Most cancers happen by chance and this negative test suggests that her cancer falls into this category.  We, therefore, recommended she continue to follow the cancer management and screening guidelines provided by her  oncology and primary healthcare provider.   An individual's cancer risk and medical management are not determined by genetic test results alone. Overall cancer risk assessment incorporates additional factors, including personal medical history, family history, and any available genetic information that may result in a personalized plan for cancer prevention and surveillance.  RECOMMENDATIONS FOR FAMILY MEMBERS:  Women in this family might be at some increased risk of developing cancer, over the general population risk, simply due to the family history of cancer.  We recommended women in this family have a yearly mammogram beginning at age 62, or 12 years younger than the earliest onset of cancer, an annual clinical breast exam, and perform monthly breast self-exams. Women in this family should also have a gynecological exam as recommended by their primary provider. All family members should have a colonoscopy by age 39.  FOLLOW-UP: Lastly, we discussed with [name] that cancer genetics is a rapidly advancing field and it is possible that new genetic tests will be appropriate for her and/or her family members in the future. We encouraged her to remain in contact with cancer genetics on an annual basis so we can update her personal and family histories and let her know of advances in cancer genetics that may benefit this family.   Our contact number was provided. [name] questions were answered to her satisfaction, and she knows she is welcome to call us at anytime with additional questions or concerns.   Roma Kayser, MS, [facility] Certified Genetic Counselor [email or web address]

## 2018-09-27 ENCOUNTER — Encounter (HOSPITAL_COMMUNITY): Payer: Self-pay

## 2018-09-27 ENCOUNTER — Inpatient Hospital Stay (HOSPITAL_COMMUNITY): Payer: Medicare Other

## 2018-09-27 ENCOUNTER — Inpatient Hospital Stay (HOSPITAL_BASED_OUTPATIENT_CLINIC_OR_DEPARTMENT_OTHER): Payer: Medicare Other | Admitting: Hematology

## 2018-09-27 ENCOUNTER — Encounter (HOSPITAL_COMMUNITY): Payer: Self-pay | Admitting: Hematology

## 2018-09-27 VITALS — BP 157/65 | HR 48 | Temp 97.5°F | Resp 18

## 2018-09-27 VITALS — BP 149/56 | HR 52 | Temp 97.7°F | Resp 18 | Wt 135.2 lb

## 2018-09-27 DIAGNOSIS — Z79899 Other long term (current) drug therapy: Secondary | ICD-10-CM

## 2018-09-27 DIAGNOSIS — C50912 Malignant neoplasm of unspecified site of left female breast: Secondary | ICD-10-CM

## 2018-09-27 DIAGNOSIS — C50512 Malignant neoplasm of lower-outer quadrant of left female breast: Secondary | ICD-10-CM

## 2018-09-27 DIAGNOSIS — I10 Essential (primary) hypertension: Secondary | ICD-10-CM

## 2018-09-27 DIAGNOSIS — Z7982 Long term (current) use of aspirin: Secondary | ICD-10-CM | POA: Diagnosis not present

## 2018-09-27 DIAGNOSIS — Z5112 Encounter for antineoplastic immunotherapy: Secondary | ICD-10-CM | POA: Diagnosis not present

## 2018-09-27 DIAGNOSIS — R5383 Other fatigue: Secondary | ICD-10-CM

## 2018-09-27 DIAGNOSIS — Z170 Estrogen receptor positive status [ER+]: Principal | ICD-10-CM

## 2018-09-27 DIAGNOSIS — Z171 Estrogen receptor negative status [ER-]: Secondary | ICD-10-CM | POA: Diagnosis not present

## 2018-09-27 DIAGNOSIS — Z8249 Family history of ischemic heart disease and other diseases of the circulatory system: Secondary | ICD-10-CM | POA: Diagnosis not present

## 2018-09-27 DIAGNOSIS — R197 Diarrhea, unspecified: Secondary | ICD-10-CM

## 2018-09-27 LAB — CBC WITH DIFFERENTIAL/PLATELET
Abs Immature Granulocytes: 0.01 10*3/uL (ref 0.00–0.07)
BASOS ABS: 0 10*3/uL (ref 0.0–0.1)
Basophils Relative: 1 %
Eosinophils Absolute: 0.3 10*3/uL (ref 0.0–0.5)
Eosinophils Relative: 7 %
HCT: 36.1 % (ref 36.0–46.0)
Hemoglobin: 11 g/dL — ABNORMAL LOW (ref 12.0–15.0)
Immature Granulocytes: 0 %
Lymphocytes Relative: 19 %
Lymphs Abs: 0.8 10*3/uL (ref 0.7–4.0)
MCH: 29.9 pg (ref 26.0–34.0)
MCHC: 30.5 g/dL (ref 30.0–36.0)
MCV: 98.1 fL (ref 80.0–100.0)
Monocytes Absolute: 0.3 10*3/uL (ref 0.1–1.0)
Monocytes Relative: 8 %
Neutro Abs: 2.5 10*3/uL (ref 1.7–7.7)
Neutrophils Relative %: 65 %
PLATELETS: 176 10*3/uL (ref 150–400)
RBC: 3.68 MIL/uL — AB (ref 3.87–5.11)
RDW: 12.7 % (ref 11.5–15.5)
WBC: 3.9 10*3/uL — AB (ref 4.0–10.5)
nRBC: 0 % (ref 0.0–0.2)

## 2018-09-27 LAB — COMPREHENSIVE METABOLIC PANEL
ALT: 15 U/L (ref 0–44)
AST: 18 U/L (ref 15–41)
Albumin: 3.7 g/dL (ref 3.5–5.0)
Alkaline Phosphatase: 48 U/L (ref 38–126)
Anion gap: 6 (ref 5–15)
BILIRUBIN TOTAL: 0.6 mg/dL (ref 0.3–1.2)
BUN: 32 mg/dL — ABNORMAL HIGH (ref 8–23)
CO2: 24 mmol/L (ref 22–32)
Calcium: 8.9 mg/dL (ref 8.9–10.3)
Chloride: 109 mmol/L (ref 98–111)
Creatinine, Ser: 1.42 mg/dL — ABNORMAL HIGH (ref 0.44–1.00)
GFR calc Af Amer: 37 mL/min — ABNORMAL LOW (ref >60)
GFR calc non Af Amer: 32 mL/min — ABNORMAL LOW (ref >60)
Glucose, Bld: 133 mg/dL — ABNORMAL HIGH (ref 70–99)
POTASSIUM: 4.6 mmol/L (ref 3.5–5.1)
Sodium: 139 mmol/L (ref 135–145)
Total Protein: 6.1 g/dL — ABNORMAL LOW (ref 6.5–8.1)

## 2018-09-27 MED ORDER — DIPHENHYDRAMINE HCL 25 MG PO CAPS
ORAL_CAPSULE | ORAL | Status: AC
  Filled 2018-09-27: qty 1

## 2018-09-27 MED ORDER — TRASTUZUMAB CHEMO 150 MG IV SOLR
350.0000 mg | Freq: Once | INTRAVENOUS | Status: AC
  Administered 2018-09-27: 350 mg via INTRAVENOUS
  Filled 2018-09-27: qty 16.67

## 2018-09-27 MED ORDER — SODIUM CHLORIDE 0.9% FLUSH
10.0000 mL | INTRAVENOUS | Status: DC | PRN
  Administered 2018-09-27: 10 mL
  Filled 2018-09-27: qty 10

## 2018-09-27 MED ORDER — HEPARIN SOD (PORK) LOCK FLUSH 100 UNIT/ML IV SOLN
500.0000 [IU] | Freq: Once | INTRAVENOUS | Status: AC | PRN
  Administered 2018-09-27: 500 [IU]

## 2018-09-27 MED ORDER — SODIUM CHLORIDE 0.9 % IV SOLN
Freq: Once | INTRAVENOUS | Status: AC
  Administered 2018-09-27: 10:00:00 via INTRAVENOUS

## 2018-09-27 MED ORDER — ACETAMINOPHEN 325 MG PO TABS
ORAL_TABLET | ORAL | Status: AC
  Filled 2018-09-27: qty 2

## 2018-09-27 MED ORDER — DIPHENHYDRAMINE HCL 25 MG PO CAPS
25.0000 mg | ORAL_CAPSULE | Freq: Once | ORAL | Status: AC
  Administered 2018-09-27: 25 mg via ORAL

## 2018-09-27 MED ORDER — ACETAMINOPHEN 325 MG PO TABS
650.0000 mg | ORAL_TABLET | Freq: Once | ORAL | Status: AC
  Administered 2018-09-27: 650 mg via ORAL

## 2018-09-27 NOTE — Patient Instructions (Signed)
[facility] at [facility] Discharge Instructions     Thank you for choosing [facility] at [facility] to provide your oncology and hematology care.  To afford each patient quality time with our provider, please arrive at least 15 minutes before your scheduled appointment time.   If you have a lab appointment with the Cancer Center please come in thru the  Main Entrance and check in at the main information desk  You need to re-schedule your appointment should you arrive 10 or more minutes late.  We strive to give you quality time with our providers, and arriving late affects you and other patients whose appointments are after yours.  Also, if you no show three or more times for appointments you may be dismissed from the clinic at the providers discretion.     Again, thank you for choosing [facility].  Our hope is that these requests will decrease the amount of time that you wait before being seen by our physicians.       _____________________________________________________________  Should you have questions after your visit to [facility], please contact our office at [phone] between the hours of 8:00 a.m. and 4:30 p.m.  Voicemails left after 4:00 p.m. will not be returned until the following business day.  For prescription refill requests, have your pharmacy contact our office and allow 72 hours.    Cancer Center Support Programs:   > Cancer Support Group  2nd Tuesday of the month 1pm-2pm, Journey Room    

## 2018-09-27 NOTE — Patient Instructions (Signed)
[facility] Discharge Instructions for Patients Receiving Chemotherapy   Beginning January 23rd 2017 lab work for the Cancer Center will be done in the  Main lab at [facility] on 1st floor. If you have a lab appointment with the Cancer Center please come in thru the  Main Entrance and check in at the main information desk   Today you received the following chemotherapy agents Herceptin. Follow-up as scheduled. Call clinic for any questions or concerns  To help prevent nausea and vomiting after your treatment, we encourage you to take your nausea medication.   If you develop nausea and vomiting, or diarrhea that is not controlled by your medication, call the clinic.  The clinic phone number is [phone]. Office hours are Monday-Friday 8:30am-5:00pm.  BELOW ARE SYMPTOMS THAT SHOULD BE REPORTED IMMEDIATELY:  *FEVER GREATER THAN 101.0 F  *CHILLS WITH OR WITHOUT FEVER  NAUSEA AND VOMITING THAT IS NOT CONTROLLED WITH YOUR NAUSEA MEDICATION  *UNUSUAL SHORTNESS OF BREATH  *UNUSUAL BRUISING OR BLEEDING  TENDERNESS IN MOUTH AND THROAT WITH OR WITHOUT PRESENCE OF ULCERS  *URINARY PROBLEMS  *BOWEL PROBLEMS  UNUSUAL RASH Items with * indicate a potential emergency and should be followed up as soon as possible. If you have an emergency after office hours please contact your primary care physician or go to the nearest emergency department.  Please call the clinic during office hours if you have any questions or concerns.   You may also contact the Patient Navigator at [phone] should you have any questions or need assistance in obtaining follow up care.      Resources For Cancer Patients and their Caregivers ? American Cancer Society: Can assist with transportation, wigs, general needs, runs Look Good Feel Better.        1-888-227-6333 ? Cancer Care: Provides financial assistance, online support groups, medication/co-pay assistance.  1-800-813-HOPE  (4673) ? [facility] Assists [place] cancer patients and their families through emotional , educational and financial support.  336-427-4357 ? [place] DSS Where to apply for food stamps, Medicaid and utility assistance. 336-342-1394 ? [facility]: Transportation to medical appointments. 336-347-2287 ? Social Security Administration: May apply for disability if have a Stage IV cancer. 336-342-7796 1-800-772-1213 ? [place] Aging, Disability and Transit Services: Assists with nutrition, care and transit needs. 336-349-2343         

## 2018-09-27 NOTE — Progress Notes (Signed)
[address] [place], [place] [address]   CLINIC:  Medical Oncology/Hematology  PCP:  [name], MD [place] [address] [phone]   REASON FOR VISIT:  Follow-up for recurrecnt metastatic breast cancer, ER-/PR-/HER2+  CURRENT THERAPY: Herceptin every 3 weeks  BRIEF ONCOLOGIC HISTORY:    Invasive ductal carcinoma of left breast ([facility])   03/30/2017 Initial Diagnosis    Invasive ductal carcinoma of left breast ([place] [place])    08/09/2018 -  Chemotherapy    The patient had trastuzumab (HERCEPTIN) 500 mg in sodium chloride 0.9 % 250 mL chemo infusion, 525 mg, Intravenous,  Once, 3 of 6 cycles Administration: 500 mg (08/09/2018), 350 mg (09/06/2018), 350 mg (09/27/2018)  for chemotherapy treatment.     09/18/2018 Genetic Testing    Negative genetic testing for the breast and gyn cancer panel.  The Breast/GYN gene panel offered by [facility] includes sequencing and rearrangement analysis for the following 23 genes:  ATM, BRCA1, BRCA2, BRIP1, CDH1, CHEK2, EPCAM, MLH1, MSH2, MSH6, NBN, NF1, PALB2, PMS2, PTEN, RAD51C, RAD51D, STK11, and TP53.   The report date is 09/18/2018.      INTERVAL HISTORY:  Ms. Chiao 85 y.o. female returns for routine follow-up for metastatic breast cancer. She is tolerating treatment well. She does experience a little more fatigue and decrease in appetite. Otherwise she is controlling the diarrhea with imodium well. Denies any nausea or vomiting. Denies any new pains. Had not noticed any recent bleeding such as epistaxis, hematuria or hematochezia. Denies recent chest pain on exertion, shortness of breath on minimal exertion, pre-syncopal episodes, or palpitations. Denies any numbness or tingling in hands or feet. Denies any recent fevers, infections, or recent hospitalizations. Patient reports appetite at 25% and energy level at 25%. She has lost 3 pounds since her last visit.    REVIEW OF SYSTEMS:  Review of Systems    Constitutional: Positive for fatigue.  Gastrointestinal: Positive for diarrhea and nausea.  All other systems reviewed and are negative.    PAST MEDICAL/SURGICAL HISTORY:  Past Medical History:  Diagnosis Date  . Cancer ([facility])    left breast  . History of kidney stones   . Hypercholesteremia   . Hypertension    Past Surgical History:  Procedure Laterality Date  . APPENDECTOMY    . brain cyst removed  2011  . BREAST BIOPSY Left 03/28/2018   Procedure: BREAST BIOPSY;  Surgeon: [name], MD;  Location: [facility];  Service: General;  Laterality: Left;  . CATARACT EXTRACTION W/PHACO Left 11/09/2015   Procedure: CATARACT EXTRACTION PHACO AND INTRAOCULAR LENS PLACEMENT LEFT EYE cde=8.97;  Surgeon: [name], MD;  Location: [facility];  Service: Ophthalmology;  Laterality: Left;  . EYE SURGERY     KPE left  . MASTECTOMY MODIFIED RADICAL Left 05/28/2018   Procedure: LEFT MODIFIED RADICAL MASTECTOMY;  Surgeon: [name], MD;  Location: [facility];  Service: General;  Laterality: Left;  Marland Kitchen MASTECTOMY, PARTIAL Left 04/24/2017   Procedure: MASTECTOMY PARTIAL;  Surgeon: [name], MD;  Location: [facility];  Service: General;  Laterality: Left;  . PORTACATH PLACEMENT Right 08/08/2018   Procedure: INSERTION PORT-A-CATH;  Surgeon: [name], MD;  Location: [facility];  Service: General;  Laterality: Right;  . TONSILLECTOMY    . TONSILLECTOMY AND ADENOIDECTOMY       SOCIAL HISTORY:  Social History   Socioeconomic History  . Marital status: Widowed    Spouse name: Not on file  . Number of children:  Not on file  . Years of education: Not on file  . Highest education level: Not on file  Occupational History  . Not on file  Social Needs  . Financial resource strain: Not hard at all  . Food insecurity:    Worry: Never true    Inability: Never true  . Transportation needs:    Medical: No    Non-medical: No  Tobacco Use  . Smoking status: Never Smoker  . Smokeless tobacco: Never Used   Substance and Sexual Activity  . Alcohol use: No  . Drug use: No  . Sexual activity: Not Currently    Partners: Male  Lifestyle  . Physical activity:    Days per week: Patient refused    Minutes per session: Patient refused  . Stress: Not at all  Relationships  . Social connections:    Talks on phone: Patient refused    Gets together: Patient refused    Attends religious service: Patient refused    Active member of club or organization: Patient refused    Attends meetings of clubs or organizations: Patient refused    Relationship status: Patient refused  . Intimate partner violence:    Fear of current or ex partner: Patient refused    Emotionally abused: Patient refused    Physically abused: Patient refused    Forced sexual activity: Patient refused  Other Topics Concern  . Not on file  Social History Narrative  . Not on file    FAMILY HISTORY:  Family History  Problem Relation Age of Onset  . Heart failure Mother   . Heart failure Father   . Congestive Heart Failure Brother   . Tuberculosis Paternal Uncle   . Tuberculosis Maternal Grandmother   . Tuberculosis Maternal Grandfather   . Congestive Heart Failure Brother     CURRENT MEDICATIONS:  Outpatient Encounter Medications as of 09/27/2018  Medication Sig  . amLODipine (NORVASC) 5 MG tablet Take 5 mg by mouth daily.  Marland Kitchen aspirin EC 81 MG tablet Take 1 tablet (81 mg total) by mouth daily.  . ferrous sulfate 325 (65 FE) MG tablet Take 325 mg by mouth daily with breakfast.  . labetalol (NORMODYNE) 100 MG tablet Take 1 tablet (100 mg total) by mouth 2 (two) times daily.  . naproxen sodium (ALEVE) 220 MG tablet Take 220 mg by mouth daily as needed (for headache or pain).  Marland Kitchen oxybutynin (DITROPAN) 5 MG tablet Take 5 mg by mouth daily.   . prochlorperazine (COMPAZINE) 10 MG tablet Take 1 tablet (10 mg total) by mouth every 6 (six) hours as needed (Nausea or vomiting).  . simvastatin (ZOCOR) 40 MG tablet Take 40 mg by mouth  daily.  . Trastuzumab (HERCEPTIN IV) Inject 525 mg into the vein every 21 ( twenty-one) days.   No facility-administered encounter medications on file as of 09/27/2018.     ALLERGIES:  No Known Allergies   PHYSICAL EXAM:  ECOG Performance status: 1  Vitals:   09/27/18 0919  BP: (!) 149/56  Pulse: (!) 52  Resp: 18  Temp: 97.7 F (36.5 C)  SpO2: 97%   Filed Weights   09/27/18 0919  Weight: 135 lb 3.2 oz (61.3 kg)    Physical Exam Constitutional:      Appearance: Normal appearance. She is normal weight.  Cardiovascular:     Rate and Rhythm: Normal rate and regular rhythm.     Heart sounds: Normal heart sounds.  Pulmonary:     Effort: Pulmonary effort  is normal.     Breath sounds: Normal breath sounds.  Musculoskeletal: Normal range of motion.  Skin:    General: Skin is warm and dry.  Neurological:     Mental Status: She is alert and oriented to person, place, and time. Mental status is at baseline.  Psychiatric:        Mood and Affect: Mood normal.        Behavior: Behavior normal.        Thought Content: Thought content normal.        Judgment: Judgment normal.      LABORATORY DATA:  I have reviewed the labs as listed.  CBC    Component Value Date/Time   WBC 3.9 (L) 09/27/2018 0840   RBC 3.68 (L) 09/27/2018 0840   HGB 11.0 (L) 09/27/2018 0840   HCT 36.1 09/27/2018 0840   PLT 176 09/27/2018 0840   MCV 98.1 09/27/2018 0840   MCH 29.9 09/27/2018 0840   MCHC 30.5 09/27/2018 0840   RDW 12.7 09/27/2018 0840   LYMPHSABS 0.8 09/27/2018 0840   MONOABS 0.3 09/27/2018 0840   EOSABS 0.3 09/27/2018 0840   BASOSABS 0.0 09/27/2018 0840   CMP Latest Ref Rng & Units 09/27/2018 09/06/2018 08/09/2018  Glucose 70 - 99 mg/dL 133(H) 97 148(H)  BUN 8 - 23 mg/dL 32(H) 27(H) 20  Creatinine 0.44 - 1.00 mg/dL 1.42(H) 1.10(H) 1.13(H)  Sodium 135 - 145 mmol/L 139 140 139  Potassium 3.5 - 5.1 mmol/L 4.6 4.8 4.7  Chloride 98 - 111 mmol/L 109 108 108  CO2 22 - 32 mmol/L '24 23 24   ' Calcium 8.9 - 10.3 mg/dL 8.9 8.7(L) 8.9  Total Protein 6.5 - 8.1 g/dL 6.1(L) 6.1(L) 6.2(L)  Total Bilirubin 0.3 - 1.2 mg/dL 0.6 0.6 0.6  Alkaline Phos 38 - 126 U/L 48 48 45  AST 15 - 41 U/L '18 19 17  ' ALT 0 - 44 U/L '15 17 12       ' DIAGNOSTIC IMAGING:  I have independently reviewed the scans and discussed with the patient.   I have reviewed Francene Finders, NP's note and agree with the documentation.  I personally performed a face-to-face visit, made revisions and my assessment and plan is as follows.    ASSESSMENT & PLAN:   Invasive ductal carcinoma of left breast ([facility]) 1.  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  No lymph node sampling was done. - In July 2019, she noticed a lump at her prior lumpectomy site, which was painful.  She did not want to have mammogram done. -Biopsy of this lump on 03/28/2018 by Dr. Arnoldo Morale showed invasive ductal carcinoma with calcifications (2.2 cm), ER negative, PR negative, Ki-67 40%, HER-2 3+ positive by IHC.  3/3 lymph nodes positive. - Left modified radical mastectomy on 05/28/2018.  -PET/CT scan on 07/23/2018 shows few scattered mildly hypermetabolic pulmonary nodules, hypermetabolic left axillary, right paratracheal, right hilar, subcarinal adenopathy compatible with metastatic disease in the chest.  -2D echo on 08/03/2018 shows EF of 65 to 70%.   - Herceptin every 3 weeks was started on 08/09/2018.  Cycle 2 was on 09/06/2018. -She had mild worsening of her baseline diarrhea since start of Herceptin.  She uses Imodium as needed  for diarrhea.  Also experienced mild tiredness. -She is able to continue her active lifestyle including cooking for the family  and dancing on Thursday nights.  She lives independently at home. - I do not want to add Pertuzumab at this time.  She may proceed with cycle 3 of treatment today. - She will receive her cycle for treatment in 3 weeks.  I plan to repeat a PET CT scan after 4 cycles. -If there is an adequate response or progression will consider adding Pertuzumab.      Orders placed this encounter:  Orders Placed This Encounter  Procedures  . NM PET Image Restag (PS) Skull Base To Thigh  . CBC with Differential/Platelet  . Comprehensive metabolic panel  . Cancer antigen 27.29  . Cancer antigen 15-3      Derek Jack, MD [place] [phone]

## 2018-09-27 NOTE — Assessment & Plan Note (Signed)
1.  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  No lymph node sampling was done. - In July 2019, she noticed a lump at her prior lumpectomy site, which was painful.  She did not want to have mammogram done. -Biopsy of this lump on 03/28/2018 by Dr. Arnoldo Morale showed invasive ductal carcinoma with calcifications (2.2 cm), ER negative, PR negative, Ki-67 40%, HER-2 3+ positive by IHC.  3/3 lymph nodes positive. - Left modified radical mastectomy on 05/28/2018.  -PET/CT scan on 07/23/2018 shows few scattered mildly hypermetabolic pulmonary nodules, hypermetabolic left axillary, right paratracheal, right hilar, subcarinal adenopathy compatible with metastatic disease in the chest.  -2D echo on 08/03/2018 shows EF of 65 to 70%.   - Herceptin every 3 weeks was started on 08/09/2018.  Cycle 2 was on 09/06/2018. -She had mild worsening of her baseline diarrhea since start of Herceptin.  She uses Imodium as needed for diarrhea.  Also experienced mild tiredness. -She is able to continue her active lifestyle including cooking for the family and dancing on Thursday nights.  She lives independently at home. - I do not want to add Pertuzumab at this time.  She may proceed with cycle 3 of treatment today. - She will receive her cycle for treatment in 3 weeks.  I plan to repeat a PET CT scan after 4 cycles. -If there is an adequate response or progression will consider adding Pertuzumab.

## 2018-09-27 NOTE — Progress Notes (Signed)
[name] Labs reviewed and pt seen by Dr. Delton Coombes and pt approved for Herceptin infusion today per MD                                                              Carol Duarte tolerated Herceptin infusion well without complaints or incident. VSS upon discharge. Pt discharged self ambulatory in satisfactory condition

## 2018-09-28 DIAGNOSIS — D500 Iron deficiency anemia secondary to blood loss (chronic): Secondary | ICD-10-CM | POA: Diagnosis not present

## 2018-09-28 DIAGNOSIS — D649 Anemia, unspecified: Secondary | ICD-10-CM | POA: Diagnosis not present

## 2018-09-28 DIAGNOSIS — Z0000 Encounter for general adult medical examination without abnormal findings: Secondary | ICD-10-CM | POA: Diagnosis not present

## 2018-09-28 DIAGNOSIS — G930 Cerebral cysts: Secondary | ICD-10-CM | POA: Diagnosis not present

## 2018-09-28 DIAGNOSIS — Z1389 Encounter for screening for other disorder: Secondary | ICD-10-CM | POA: Diagnosis not present

## 2018-09-28 DIAGNOSIS — E785 Hyperlipidemia, unspecified: Secondary | ICD-10-CM | POA: Diagnosis not present

## 2018-09-28 DIAGNOSIS — C50912 Malignant neoplasm of unspecified site of left female breast: Secondary | ICD-10-CM | POA: Diagnosis not present

## 2018-09-28 DIAGNOSIS — I10 Essential (primary) hypertension: Secondary | ICD-10-CM | POA: Diagnosis not present

## 2018-09-28 DIAGNOSIS — E1165 Type 2 diabetes mellitus with hyperglycemia: Secondary | ICD-10-CM | POA: Diagnosis not present

## 2018-10-18 ENCOUNTER — Encounter (HOSPITAL_COMMUNITY): Payer: Self-pay | Admitting: Hematology

## 2018-10-18 ENCOUNTER — Inpatient Hospital Stay (HOSPITAL_BASED_OUTPATIENT_CLINIC_OR_DEPARTMENT_OTHER): Payer: Medicare Other | Admitting: Hematology

## 2018-10-18 ENCOUNTER — Inpatient Hospital Stay (HOSPITAL_COMMUNITY): Payer: Medicare Other | Attending: Hematology

## 2018-10-18 ENCOUNTER — Encounter (HOSPITAL_COMMUNITY): Payer: Self-pay

## 2018-10-18 ENCOUNTER — Inpatient Hospital Stay (HOSPITAL_COMMUNITY): Payer: Medicare Other

## 2018-10-18 VITALS — BP 171/55 | HR 52 | Temp 98.0°F | Resp 16 | Wt 138.6 lb

## 2018-10-18 VITALS — BP 138/48 | HR 53 | Temp 98.0°F | Resp 18

## 2018-10-18 DIAGNOSIS — Z171 Estrogen receptor negative status [ER-]: Secondary | ICD-10-CM | POA: Diagnosis not present

## 2018-10-18 DIAGNOSIS — R197 Diarrhea, unspecified: Secondary | ICD-10-CM | POA: Insufficient documentation

## 2018-10-18 DIAGNOSIS — Z7982 Long term (current) use of aspirin: Secondary | ICD-10-CM | POA: Diagnosis not present

## 2018-10-18 DIAGNOSIS — Z79899 Other long term (current) drug therapy: Secondary | ICD-10-CM | POA: Insufficient documentation

## 2018-10-18 DIAGNOSIS — C50912 Malignant neoplasm of unspecified site of left female breast: Secondary | ICD-10-CM

## 2018-10-18 DIAGNOSIS — Z8249 Family history of ischemic heart disease and other diseases of the circulatory system: Secondary | ICD-10-CM | POA: Diagnosis not present

## 2018-10-18 DIAGNOSIS — R530 Neoplastic (malignant) related fatigue: Secondary | ICD-10-CM | POA: Diagnosis not present

## 2018-10-18 DIAGNOSIS — I10 Essential (primary) hypertension: Secondary | ICD-10-CM | POA: Insufficient documentation

## 2018-10-18 DIAGNOSIS — Z170 Estrogen receptor positive status [ER+]: Principal | ICD-10-CM

## 2018-10-18 DIAGNOSIS — Z5112 Encounter for antineoplastic immunotherapy: Secondary | ICD-10-CM | POA: Insufficient documentation

## 2018-10-18 DIAGNOSIS — C50512 Malignant neoplasm of lower-outer quadrant of left female breast: Secondary | ICD-10-CM

## 2018-10-18 LAB — CBC WITH DIFFERENTIAL/PLATELET
Abs Immature Granulocytes: 0 10*3/uL (ref 0.00–0.07)
Basophils Absolute: 0 10*3/uL (ref 0.0–0.1)
Basophils Relative: 1 %
Eosinophils Absolute: 0.3 10*3/uL (ref 0.0–0.5)
Eosinophils Relative: 8 %
HCT: 36.6 % (ref 36.0–46.0)
Hemoglobin: 11.2 g/dL — ABNORMAL LOW (ref 12.0–15.0)
Immature Granulocytes: 0 %
Lymphocytes Relative: 20 %
Lymphs Abs: 0.7 10*3/uL (ref 0.7–4.0)
MCH: 29.6 pg (ref 26.0–34.0)
MCHC: 30.6 g/dL (ref 30.0–36.0)
MCV: 96.8 fL (ref 80.0–100.0)
MONOS PCT: 10 %
Monocytes Absolute: 0.4 10*3/uL (ref 0.1–1.0)
Neutro Abs: 2.3 10*3/uL (ref 1.7–7.7)
Neutrophils Relative %: 61 %
Platelets: 187 10*3/uL (ref 150–400)
RBC: 3.78 MIL/uL — ABNORMAL LOW (ref 3.87–5.11)
RDW: 12.8 % (ref 11.5–15.5)
WBC: 3.7 10*3/uL — ABNORMAL LOW (ref 4.0–10.5)
nRBC: 0 % (ref 0.0–0.2)

## 2018-10-18 LAB — COMPREHENSIVE METABOLIC PANEL
ALT: 15 U/L (ref 0–44)
AST: 18 U/L (ref 15–41)
Albumin: 3.8 g/dL (ref 3.5–5.0)
Alkaline Phosphatase: 42 U/L (ref 38–126)
Anion gap: 9 (ref 5–15)
BUN: 27 mg/dL — ABNORMAL HIGH (ref 8–23)
CO2: 23 mmol/L (ref 22–32)
Calcium: 9 mg/dL (ref 8.9–10.3)
Chloride: 108 mmol/L (ref 98–111)
Creatinine, Ser: 1.33 mg/dL — ABNORMAL HIGH (ref 0.44–1.00)
GFR calc Af Amer: 40 mL/min — ABNORMAL LOW (ref >60)
GFR calc non Af Amer: 35 mL/min — ABNORMAL LOW (ref >60)
Glucose, Bld: 131 mg/dL — ABNORMAL HIGH (ref 70–99)
POTASSIUM: 4.8 mmol/L (ref 3.5–5.1)
Sodium: 140 mmol/L (ref 135–145)
Total Bilirubin: 0.5 mg/dL (ref 0.3–1.2)
Total Protein: 6 g/dL — ABNORMAL LOW (ref 6.5–8.1)

## 2018-10-18 MED ORDER — LORAZEPAM 1 MG PO TABS: 1.0000 mg | ORAL_TABLET | Freq: Once | ORAL | 0 refills | Status: AC

## 2018-10-18 MED ORDER — DIPHENHYDRAMINE HCL 25 MG PO CAPS
ORAL_CAPSULE | ORAL | Status: AC
  Filled 2018-10-18: qty 1

## 2018-10-18 MED ORDER — ACETAMINOPHEN 325 MG PO TABS
ORAL_TABLET | ORAL | Status: AC
  Filled 2018-10-18: qty 2

## 2018-10-18 MED ORDER — HEPARIN SOD (PORK) LOCK FLUSH 100 UNIT/ML IV SOLN
500.0000 [IU] | Freq: Once | INTRAVENOUS | Status: AC | PRN
  Administered 2018-10-18: 500 [IU]

## 2018-10-18 MED ORDER — SODIUM CHLORIDE 0.9% FLUSH
10.0000 mL | INTRAVENOUS | Status: DC | PRN
  Administered 2018-10-18: 10 mL
  Filled 2018-10-18: qty 10

## 2018-10-18 MED ORDER — TRASTUZUMAB CHEMO 150 MG IV SOLR
6.0000 mg/kg | Freq: Once | INTRAVENOUS | Status: AC
  Administered 2018-10-18: 378 mg via INTRAVENOUS
  Filled 2018-10-18: qty 18

## 2018-10-18 MED ORDER — ACETAMINOPHEN 325 MG PO TABS
650.0000 mg | ORAL_TABLET | Freq: Once | ORAL | Status: AC
  Administered 2018-10-18: 650 mg via ORAL

## 2018-10-18 MED ORDER — DIPHENHYDRAMINE HCL 25 MG PO CAPS
25.0000 mg | ORAL_CAPSULE | Freq: Once | ORAL | Status: AC
  Administered 2018-10-18: 25 mg via ORAL

## 2018-10-18 MED ORDER — SODIUM CHLORIDE 0.9 % IV SOLN
Freq: Once | INTRAVENOUS | Status: AC
  Administered 2018-10-18: 09:00:00 via INTRAVENOUS

## 2018-10-18 NOTE — Assessment & Plan Note (Signed)
1.  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  No lymph node sampling was done. - In July 2019, she noticed a lump at her prior lumpectomy site, which was painful.  She did not want to have mammogram done. -Biopsy of this lump on 03/28/2018 by Dr. Arnoldo Morale showed invasive ductal carcinoma with calcifications (2.2 cm), ER negative, PR negative, Ki-67 40%, HER-2 3+ positive by IHC.  3/3 lymph nodes positive. - Left modified radical mastectomy on 05/28/2018.  -PET/CT scan on 07/23/2018 shows few scattered mildly hypermetabolic pulmonary nodules, hypermetabolic left axillary, right paratracheal, right hilar, subcarinal adenopathy compatible with metastatic disease in the chest.  -2D echo on 08/03/2018 shows EF of 65 to 70%.   - Herceptin every 3 weeks was started on 08/09/2018. -She completed cycle 3 on 09/27/2018.  She has noticed mild worsening of diarrhea since the start of Herceptin. -She is continuing to maintain her active lifestyle and lives independently at home. -She may proceed with her cycle 4 today.  I plan to do a PET CT scan to evaluate response after this cycle. -We will see her back in 3 weeks to discuss the results. -If there is an adequate response, will consider adding Pertuzumab.  Steward Drone is also an option.  2.  Diarrhea: -Herceptin induced.  She is taking Imodium as needed. -If it gets worse we will consider adding Lomotil.

## 2018-10-18 NOTE — Addendum Note (Signed)
Addended by: Glennie Isle on: 10/18/2018 11:06 AM   Modules accepted: Orders

## 2018-10-18 NOTE — Patient Instructions (Signed)
[facility] at [facility] Discharge Instructions  Follow up in 3 weeks with labs and PET scan  Thank you for choosing [facility] at [facility] to provide your oncology and hematology care.  To afford each patient quality time with our provider, please arrive at least 15 minutes before your scheduled appointment time.   If you have a lab appointment with the Cancer Center please come in thru the  Main Entrance and check in at the main information desk  You need to re-schedule your appointment should you arrive 10 or more minutes late.  We strive to give you quality time with our providers, and arriving late affects you and other patients whose appointments are after yours.  Also, if you no show three or more times for appointments you may be dismissed from the clinic at the providers discretion.     Again, thank you for choosing [facility].  Our hope is that these requests will decrease the amount of time that you wait before being seen by our physicians.       _____________________________________________________________  Should you have questions after your visit to [facility], please contact our office at [phone] between the hours of 8:00 a.m. and 4:30 p.m.  Voicemails left after 4:00 p.m. will not be returned until the following business day.  For prescription refill requests, have your pharmacy contact our office and allow 72 hours.    Cancer Center Support Programs:   > Cancer Support Group  2nd Tuesday of the month 1pm-2pm, Journey Room    

## 2018-10-18 NOTE — Patient Instructions (Signed)
[facility] Discharge Instructions for Patients Receiving Chemotherapy  Today you received the following chemotherapy agents   To help prevent nausea and vomiting after your treatment, we encourage you to take your nausea medication   If you develop nausea and vomiting that is not controlled by your nausea medication, call the clinic.   BELOW ARE SYMPTOMS THAT SHOULD BE REPORTED IMMEDIATELY:  *FEVER GREATER THAN 100.5 F  *CHILLS WITH OR WITHOUT FEVER  NAUSEA AND VOMITING THAT IS NOT CONTROLLED WITH YOUR NAUSEA MEDICATION  *UNUSUAL SHORTNESS OF BREATH  *UNUSUAL BRUISING OR BLEEDING  TENDERNESS IN MOUTH AND THROAT WITH OR WITHOUT PRESENCE OF ULCERS  *URINARY PROBLEMS  *BOWEL PROBLEMS  UNUSUAL RASH Items with * indicate a potential emergency and should be followed up as soon as possible.  Feel free to call the clinic should you have any questions or concerns. The clinic phone number is [phone].  Please show the CHEMO ALERT CARD at check-in to the Emergency Department and triage nurse.   

## 2018-10-18 NOTE — Progress Notes (Signed)
Labs reviewed with Dr. Delton Coombes today at office visit. Proceed with treatment.   Treatment given per orders. Patient tolerated it well without problems. Vitals stable and discharged home from clinic ambulatory. Follow up as scheduled.

## 2018-10-18 NOTE — Progress Notes (Signed)
Carol Duarte, [place] [address]   CLINIC:  Medical Oncology/Hematology  PCP:  [name], MD [place] [address] [phone]   REASON FOR VISIT: Follow-up for recurrecnt metastatic breast cancer, ER-/PR-/HER2+  CURRENT THERAPY:Herceptin every 3 weeks  BRIEF ONCOLOGIC HISTORY:    Invasive ductal carcinoma of left breast ([place])   03/30/2017 Initial Diagnosis    Invasive ductal carcinoma of left breast ([place])    08/09/2018 -  Chemotherapy    The patient had trastuzumab (HERCEPTIN) 500 mg in sodium chloride 0.9 % 250 mL chemo infusion, 525 mg, Intravenous,  Once, 4 of 6 cycles Administration: 500 mg (08/09/2018), 350 mg (09/06/2018), 350 mg (09/27/2018)  for chemotherapy treatment.     09/18/2018 Genetic Testing    Negative genetic testing for the breast and gyn cancer panel.  The Breast/GYN gene panel offered by [facility] includes sequencing and rearrangement analysis for the following 23 genes:  ATM, BRCA1, BRCA2, BRIP1, CDH1, CHEK2, EPCAM, MLH1, MSH2, MSH6, NBN, NF1, PALB2, PMS2, PTEN, RAD51C, RAD51D, STK11, and TP53.   The report date is 09/18/2018.      INTERVAL HISTORY:  Carol Duarte 85 y.o. female returns for routine follow-up for recurrecnt metastatic breast cancer. She reports her appetite is decreased and her energy level is down. She reports her diarrhea is worse and she has been taking more imodium. Denies any nausea, vomiting, or diarrhea. Denies any new pains. Had not noticed any recent bleeding such as epistaxis, hematuria or hematochezia. Denies recent chest pain on exertion, shortness of breath on minimal exertion, pre-syncopal episodes, or palpitations. Denies any numbness or tingling in hands or feet. Denies any recent fevers, infections, or recent hospitalizations. Patient reports appetite at 50% and energy level at 50%.   REVIEW OF SYSTEMS:  Review of Systems  Constitutional: Positive for fatigue.    Gastrointestinal: Positive for diarrhea.  All other systems reviewed and are negative.    PAST MEDICAL/SURGICAL HISTORY:  Past Medical History:  Diagnosis Date  . Cancer ([place])    left breast  . History of kidney stones   . Hypercholesteremia   . Hypertension    Past Surgical History:  Procedure Laterality Date  . APPENDECTOMY    . brain cyst removed  2011  . BREAST BIOPSY Left 03/28/2018   Procedure: BREAST BIOPSY;  Surgeon: [name], MD;  Location: [facility];  Service: General;  Laterality: Left;  . CATARACT EXTRACTION W/PHACO Left 11/09/2015   Procedure: CATARACT EXTRACTION PHACO AND INTRAOCULAR LENS PLACEMENT LEFT EYE cde=8.97;  Surgeon: [name], MD;  Location: [facility];  Service: Ophthalmology;  Laterality: Left;  . EYE SURGERY     KPE left  . MASTECTOMY MODIFIED RADICAL Left 05/28/2018   Procedure: LEFT MODIFIED RADICAL MASTECTOMY;  Surgeon: [name], MD;  Location: [facility];  Service: General;  Laterality: Left;  Marland Kitchen MASTECTOMY, PARTIAL Left 04/24/2017   Procedure: MASTECTOMY PARTIAL;  Surgeon: [name], MD;  Location: [facility];  Service: General;  Laterality: Left;  . PORTACATH PLACEMENT Right 08/08/2018   Procedure: INSERTION PORT-A-CATH;  Surgeon: [name], MD;  Location: [facility];  Service: General;  Laterality: Right;  . TONSILLECTOMY    . TONSILLECTOMY AND ADENOIDECTOMY       SOCIAL HISTORY:  Social History   Socioeconomic History  . Marital status: Widowed    Spouse name: Not on file  . Number of children: Not on file  . Years of education: Not on file  .  Highest education level: Not on file  Occupational History  . Not on file  Social Needs  . Financial resource strain: Not hard at all  . Food insecurity:    Worry: Never true    Inability: Never true  . Transportation needs:    Medical: No    Non-medical: No  Tobacco Use  . Smoking status: Never Smoker  . Smokeless tobacco: Never Used  Substance and Sexual Activity  . Alcohol use: No  .  Drug use: No  . Sexual activity: Not Currently    Partners: Male  Lifestyle  . Physical activity:    Days per week: Patient refused    Minutes per session: Patient refused  . Stress: Not at all  Relationships  . Social connections:    Talks on phone: Patient refused    Gets together: Patient refused    Attends religious service: Patient refused    Active member of club or organization: Patient refused    Attends meetings of clubs or organizations: Patient refused    Relationship status: Patient refused  . Intimate partner violence:    Fear of current or ex partner: Patient refused    Emotionally abused: Patient refused    Physically abused: Patient refused    Forced sexual activity: Patient refused  Other Topics Concern  . Not on file  Social History Narrative  . Not on file    FAMILY HISTORY:  Family History  Problem Relation Age of Onset  . Heart failure Mother   . Heart failure Father   . Congestive Heart Failure Brother   . Tuberculosis Paternal Uncle   . Tuberculosis Maternal Grandmother   . Tuberculosis Maternal Grandfather   . Congestive Heart Failure Brother     CURRENT MEDICATIONS:  Outpatient Encounter Medications as of 10/18/2018  Medication Sig  . amLODipine (NORVASC) 5 MG tablet Take 5 mg by mouth daily.  Marland Kitchen aspirin EC 81 MG tablet Take 1 tablet (81 mg total) by mouth daily.  . ferrous sulfate 325 (65 FE) MG tablet Take 325 mg by mouth daily with breakfast.  . labetalol (NORMODYNE) 100 MG tablet Take 1 tablet (100 mg total) by mouth 2 (two) times daily.  . naproxen sodium (ALEVE) 220 MG tablet Take 220 mg by mouth daily as needed (for headache or pain).  Marland Kitchen oxybutynin (DITROPAN) 5 MG tablet Take 5 mg by mouth daily.   . prochlorperazine (COMPAZINE) 10 MG tablet Take 1 tablet (10 mg total) by mouth every 6 (six) hours as needed (Nausea or vomiting).  . simvastatin (ZOCOR) 40 MG tablet Take 40 mg by mouth daily.  . Trastuzumab (HERCEPTIN IV) Inject 525 mg  into the vein every 21 ( twenty-one) days.   No facility-administered encounter medications on file as of 10/18/2018.     ALLERGIES:  No Known Allergies   PHYSICAL EXAM:  ECOG Performance status: 1  Vitals:   10/18/18 0829  BP: (!) 171/55  Pulse: (!) 52  Resp: 16  Temp: 98 F (36.7 C)  SpO2: 98%   Filed Weights   10/18/18 0829  Weight: 138 lb 9.6 oz (62.9 kg)    Physical Exam Constitutional:      Appearance: Normal appearance. She is normal weight.  Cardiovascular:     Rate and Rhythm: Normal rate and regular rhythm.     Heart sounds: Normal heart sounds.  Pulmonary:     Effort: Pulmonary effort is normal.     Breath sounds: Normal breath sounds.  Musculoskeletal:  Normal range of motion.  Skin:    General: Skin is warm and dry.  Neurological:     Mental Status: She is alert and oriented to person, place, and time. Mental status is at baseline.  Psychiatric:        Mood and Affect: Mood normal.        Behavior: Behavior normal.        Thought Content: Thought content normal.        Judgment: Judgment normal.      LABORATORY DATA:  I have reviewed the labs as listed.  CBC    Component Value Date/Time   WBC 3.7 (L) 10/18/2018 0805   RBC 3.78 (L) 10/18/2018 0805   HGB 11.2 (L) 10/18/2018 0805   HCT 36.6 10/18/2018 0805   PLT 187 10/18/2018 0805   MCV 96.8 10/18/2018 0805   MCH 29.6 10/18/2018 0805   MCHC 30.6 10/18/2018 0805   RDW 12.8 10/18/2018 0805   LYMPHSABS 0.7 10/18/2018 0805   MONOABS 0.4 10/18/2018 0805   EOSABS 0.3 10/18/2018 0805   BASOSABS 0.0 10/18/2018 0805   CMP Latest Ref Rng & Units 10/18/2018 09/27/2018 09/06/2018  Glucose 70 - 99 mg/dL 131(H) 133(H) 97  BUN 8 - 23 mg/dL 27(H) 32(H) 27(H)  Creatinine 0.44 - 1.00 mg/dL 1.33(H) 1.42(H) 1.10(H)  Sodium 135 - 145 mmol/L 140 139 140  Potassium 3.5 - 5.1 mmol/L 4.8 4.6 4.8  Chloride 98 - 111 mmol/L 108 109 108  CO2 22 - 32 mmol/L _0 Calcium 8.9 - 10.3 mg/dL 9.0 8.9 8.7(L)  Total  Protein 6.5 - 8.1 g/dL 6.0(L) 6.1(L) 6.1(L)  Total Bilirubin 0.3 - 1.2 mg/dL 0.5 0.6 0.6  Alkaline Phos 38 - 126 U/L 42 48 48  AST 15 - 41 U/L _1 ALT 0 - 44 U/L _2 DIAGNOSTIC IMAGING:  I have independently reviewed the scans and discussed with the patient.   I have reviewed Francene Finders, NP's note and agree with the documentation.  I personally performed a face-to-face visit, made revisions and my assessment and plan is as follows.    ASSESSMENT & PLAN:   Invasive ductal carcinoma of left breast ([place]) 1.  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  No lymph node sampling was done. - In July 2019, she noticed a lump at her prior lumpectomy site, which was painful.  She did not want to have mammogram done. -Biopsy of this lump on 03/28/2018 by Dr. Arnoldo Morale showed invasive ductal carcinoma with calcifications (2.2 cm), ER negative, PR negative, Ki-67 40%, HER-2 3+ positive by IHC.  3/3 lymph nodes positive. - Left modified radical mastectomy on 05/28/2018.  -PET/CT scan on 07/23/2018 shows few scattered mildly hypermetabolic pulmonary nodules, hypermetabolic left axillary, right paratracheal, right hilar, subcarinal adenopathy compatible with metastatic disease in the chest.  -2D echo on 08/03/2018 shows EF of 65 to 70%.   - Herceptin every 3 weeks was started on 08/09/2018. -She completed cycle 3 on 09/27/2018.  She has noticed mild worsening of diarrhea since the start of Herceptin. -She is continuing to maintain her active lifestyle and lives independently at home. -She may proceed with her  cycle 4 today.  I plan to do a PET CT scan to evaluate response after this  cycle. -We will see her back in 3 weeks to discuss the results. -If there is an adequate response, will consider adding Pertuzumab.  Steward Drone is also an option.  2.  Diarrhea: -Herceptin induced.  She is taking Imodium as needed. -If it gets worse we will consider adding Lomotil.      Orders placed this encounter:  Orders Placed This Encounter  Procedures  . NM PET Image Restag (PS) Skull Base To Thigh  . CBC with Differential/Platelet  . Comprehensive metabolic panel  . Cancer antigen 15-3  . Cancer antigen 27.29      Derek Jack, MD [place] [phone]

## 2018-10-19 ENCOUNTER — Other Ambulatory Visit (HOSPITAL_COMMUNITY): Payer: Medicare Other

## 2018-10-19 ENCOUNTER — Ambulatory Visit (HOSPITAL_COMMUNITY): Payer: Medicare Other | Admitting: Hematology

## 2018-10-19 ENCOUNTER — Ambulatory Visit (HOSPITAL_COMMUNITY): Payer: Medicare Other

## 2018-10-29 ENCOUNTER — Encounter (HOSPITAL_COMMUNITY)
Admission: RE | Admit: 2018-10-29 | Discharge: 2018-10-29 | Disposition: A | Discharge status: Home / Self Care | Payer: Medicare Other | Source: Ambulatory Visit | Attending: Nurse Practitioner | Admitting: Nurse Practitioner

## 2018-10-29 DIAGNOSIS — C7800 Secondary malignant neoplasm of unspecified lung: Secondary | ICD-10-CM | POA: Diagnosis not present

## 2018-10-29 DIAGNOSIS — H25811 Combined forms of age-related cataract, right eye: Secondary | ICD-10-CM | POA: Diagnosis not present

## 2018-10-29 DIAGNOSIS — H26491 Other secondary cataract, right eye: Secondary | ICD-10-CM | POA: Diagnosis not present

## 2018-10-29 DIAGNOSIS — C50512 Malignant neoplasm of lower-outer quadrant of left female breast: Secondary | ICD-10-CM | POA: Diagnosis not present

## 2018-10-29 DIAGNOSIS — Z170 Estrogen receptor positive status [ER+]: Secondary | ICD-10-CM | POA: Insufficient documentation

## 2018-10-29 DIAGNOSIS — C50919 Malignant neoplasm of unspecified site of unspecified female breast: Secondary | ICD-10-CM | POA: Diagnosis not present

## 2018-10-29 MED ORDER — FLUDEOXYGLUCOSE F - 18 (FDG) INJECTION
8.6500 | Freq: Once | INTRAVENOUS | Status: AC | PRN
  Administered 2018-10-29: 9.43 via INTRAVENOUS

## 2018-11-08 ENCOUNTER — Other Ambulatory Visit: Payer: Self-pay

## 2018-11-08 ENCOUNTER — Inpatient Hospital Stay (HOSPITAL_COMMUNITY): Payer: Medicare Other | Attending: Hematology

## 2018-11-08 ENCOUNTER — Inpatient Hospital Stay (HOSPITAL_COMMUNITY): Payer: Medicare Other

## 2018-11-08 ENCOUNTER — Other Ambulatory Visit (HOSPITAL_COMMUNITY): Payer: Medicare Other

## 2018-11-08 ENCOUNTER — Inpatient Hospital Stay (HOSPITAL_COMMUNITY): Payer: Medicare Other | Attending: Hematology | Admitting: Hematology

## 2018-11-08 ENCOUNTER — Encounter (HOSPITAL_COMMUNITY): Payer: Self-pay | Admitting: Hematology

## 2018-11-08 ENCOUNTER — Encounter (HOSPITAL_COMMUNITY): Payer: Self-pay

## 2018-11-08 VITALS — BP 142/42 | HR 50 | Temp 97.6°F | Resp 18

## 2018-11-08 DIAGNOSIS — Z171 Estrogen receptor negative status [ER-]: Secondary | ICD-10-CM | POA: Insufficient documentation

## 2018-11-08 DIAGNOSIS — C50912 Malignant neoplasm of unspecified site of left female breast: Secondary | ICD-10-CM | POA: Insufficient documentation

## 2018-11-08 DIAGNOSIS — Z8249 Family history of ischemic heart disease and other diseases of the circulatory system: Secondary | ICD-10-CM | POA: Diagnosis not present

## 2018-11-08 DIAGNOSIS — R0602 Shortness of breath: Secondary | ICD-10-CM | POA: Insufficient documentation

## 2018-11-08 DIAGNOSIS — Z7982 Long term (current) use of aspirin: Secondary | ICD-10-CM

## 2018-11-08 DIAGNOSIS — R197 Diarrhea, unspecified: Secondary | ICD-10-CM

## 2018-11-08 DIAGNOSIS — C50512 Malignant neoplasm of lower-outer quadrant of left female breast: Secondary | ICD-10-CM

## 2018-11-08 DIAGNOSIS — Z5112 Encounter for antineoplastic immunotherapy: Secondary | ICD-10-CM | POA: Diagnosis not present

## 2018-11-08 DIAGNOSIS — Z79899 Other long term (current) drug therapy: Secondary | ICD-10-CM | POA: Insufficient documentation

## 2018-11-08 DIAGNOSIS — Z170 Estrogen receptor positive status [ER+]: Principal | ICD-10-CM

## 2018-11-08 LAB — CBC WITH DIFFERENTIAL/PLATELET
Abs Immature Granulocytes: 0.01 10*3/uL (ref 0.00–0.07)
Basophils Absolute: 0.1 10*3/uL (ref 0.0–0.1)
Basophils Relative: 1 %
Eosinophils Absolute: 0.3 10*3/uL (ref 0.0–0.5)
Eosinophils Relative: 7 %
HCT: 34.6 % — ABNORMAL LOW (ref 36.0–46.0)
Hemoglobin: 10.7 g/dL — ABNORMAL LOW (ref 12.0–15.0)
Immature Granulocytes: 0 %
Lymphocytes Relative: 16 %
Lymphs Abs: 0.6 10*3/uL — ABNORMAL LOW (ref 0.7–4.0)
MCH: 29.8 pg (ref 26.0–34.0)
MCHC: 30.9 g/dL (ref 30.0–36.0)
MCV: 96.4 fL (ref 80.0–100.0)
MONO ABS: 0.3 10*3/uL (ref 0.1–1.0)
Monocytes Relative: 9 %
Neutro Abs: 2.7 10*3/uL (ref 1.7–7.7)
Neutrophils Relative %: 67 %
Platelets: 190 10*3/uL (ref 150–400)
RBC: 3.59 MIL/uL — ABNORMAL LOW (ref 3.87–5.11)
RDW: 13 % (ref 11.5–15.5)
WBC: 4 10*3/uL (ref 4.0–10.5)
nRBC: 0 % (ref 0.0–0.2)

## 2018-11-08 LAB — COMPREHENSIVE METABOLIC PANEL
ALT: 14 U/L (ref 0–44)
ANION GAP: 8 (ref 5–15)
AST: 16 U/L (ref 15–41)
Albumin: 3.5 g/dL (ref 3.5–5.0)
Alkaline Phosphatase: 55 U/L (ref 38–126)
BUN: 28 mg/dL — ABNORMAL HIGH (ref 8–23)
CO2: 24 mmol/L (ref 22–32)
Calcium: 9 mg/dL (ref 8.9–10.3)
Chloride: 108 mmol/L (ref 98–111)
Creatinine, Ser: 1.16 mg/dL — ABNORMAL HIGH (ref 0.44–1.00)
GFR calc Af Amer: 48 mL/min — ABNORMAL LOW (ref >60)
GFR calc non Af Amer: 41 mL/min — ABNORMAL LOW (ref >60)
Glucose, Bld: 135 mg/dL — ABNORMAL HIGH (ref 70–99)
Potassium: 4.7 mmol/L (ref 3.5–5.1)
Sodium: 140 mmol/L (ref 135–145)
TOTAL PROTEIN: 5.9 g/dL — AB (ref 6.5–8.1)
Total Bilirubin: 0.4 mg/dL (ref 0.3–1.2)

## 2018-11-08 MED ORDER — SODIUM CHLORIDE 0.9% FLUSH
10.0000 mL | INTRAVENOUS | Status: DC | PRN
  Administered 2018-11-08: 10 mL
  Filled 2018-11-08: qty 10

## 2018-11-08 MED ORDER — HEPARIN SOD (PORK) LOCK FLUSH 100 UNIT/ML IV SOLN
500.0000 [IU] | Freq: Once | INTRAVENOUS | Status: AC | PRN
  Administered 2018-11-08: 500 [IU]

## 2018-11-08 NOTE — Assessment & Plan Note (Signed)
1.  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  No lymph node sampling was done. - In July 2019, she noticed a lump at her prior lumpectomy site, which was painful.  She did not want to have mammogram done. -Biopsy of this lump on 03/28/2018 by Dr. Jenkins showed invasive ductal carcinoma with calcifications (2.2 cm), ER negative, PR negative, Ki-67 40%, HER-2 3+ positive by IHC.  3/3 lymph nodes positive. - Left modified radical mastectomy on 05/28/2018.  -PET/CT scan on 07/23/2018 shows few scattered mildly hypermetabolic pulmonary nodules, hypermetabolic left axillary, right paratracheal, right hilar, subcarinal adenopathy compatible with metastatic disease in the chest.  -2D echo on 08/03/2018 shows EF of 65 to 70%.   - 4 cycles of single agent Herceptin every 3 weeks from 08/09/2018 through 10/18/2018. -She is continuing to maintain her active lifestyle and lives independently at home. -She has slight diarrhea and takes Imodium up to 5 tablets/day.  She is taking 3 tablets in the morning and 2 tablets in the afternoon. - We discussed the results of PET CT scan dated 10/30/2018 which shows mild progression of pulmonary metastasis.  Stable mediastinal lymph nodes.  2 adjacent hypermetabolic left axillary lymph nodes, previously only 1 lymph node noted.  No other evidence of metastasis in the abdomen or pelvis or skeleton. - I have recommended adding Pertuzumab to the regimen as she has very mild progression with single agent Herceptin.  We will get prior authorization from her insurance.  We discussed about the side effects of Pertuzumab in detail. -I will see her back in 3 weeks after next treatment with  combination.  I plan to obtain a with 2D echocardiogram prior to next visit.  2.  Diarrhea: -This is Herceptin induced.  She was told to take Imodium 2 tablets at the first onset of watery diarrhea, followed by 1 tablet after each watery bowel movement. -Limited tablets per day. 

## 2018-11-08 NOTE — Patient Instructions (Signed)
[facility]  Discharge Instructions:   _______________________________________________________________  Thank you for choosing [facility] at [facility] to provide your oncology and hematology care.  To afford each patient quality time with our providers, please arrive at least 15 minutes before your scheduled appointment.  You need to re-schedule your appointment if you arrive 10 or more minutes late.  We strive to give you quality time with our providers, and arriving late affects you and other patients whose appointments are after yours.  Also, if you no show three or more times for appointments you may be dismissed from the clinic.  Again, thank you for choosing [facility]. Our hope is that these requests will allow you access to exceptional care and in a timely manner. _______________________________________________________________  If you have questions after your visit, please contact our office at [phone] between the hours of 8:30 a.m. and 5:00 p.m. Voicemails left after 4:30 p.m. will not be returned until the following business day. _______________________________________________________________  For prescription refill requests, have your pharmacy contact our office. _______________________________________________________________  Recommendations made by the consultant and any test results will be sent to your referring physician. _______________________________________________________________ 

## 2018-11-08 NOTE — Progress Notes (Signed)
No treatment today per MD orders. Financial Counselor notified that treatment plan denied by insurance and authorization needs to be approved. Vital signs stable. Discharged from clinic ambulatory. F/U with [facility] as scheduled.

## 2018-11-08 NOTE — Progress Notes (Signed)
[address] [place], [place] [address]   CLINIC:  Medical Oncology/Hematology  PCP:  [name], MD [place] [address] [phone]   REASON FOR VISIT:  Follow-up for  recurrecnt metastatic breast cancer, ER-/PR-/HER2+  CURRENT THERAPY:Herceptin every 3 weeks  BRIEF ONCOLOGIC HISTORY:    Invasive ductal carcinoma of left breast ([place])   03/30/2017 Initial Diagnosis    Invasive ductal carcinoma of left breast ([place])    08/09/2018 -  Chemotherapy    The patient had trastuzumab (HERCEPTIN) 500 mg in sodium chloride 0.9 % 250 mL chemo infusion, 525 mg, Intravenous,  Once, 5 of 10 cycles Administration: 500 mg (08/09/2018), 350 mg (09/06/2018), 350 mg (09/27/2018), 378 mg (10/18/2018) pertuzumab (PERJETA) 840 mg in sodium chloride 0.9 % 250 mL chemo infusion, 840 mg (100 % of original dose 840 mg), Intravenous, Once, 1 of 6 cycles Dose modification: 840 mg (original dose 840 mg, Cycle 5), 420 mg (Cycle 6)  for chemotherapy treatment.     09/18/2018 Genetic Testing    Negative genetic testing for the breast and gyn cancer panel.  The Breast/GYN gene panel offered by [facility] includes sequencing and rearrangement analysis for the following 23 genes:  ATM, BRCA1, BRCA2, BRIP1, CDH1, CHEK2, EPCAM, MLH1, MSH2, MSH6, NBN, NF1, PALB2, PMS2, PTEN, RAD51C, RAD51D, STK11, and TP53.   The report date is 09/18/2018.      CANCER STAGING: Cancer Staging No matching staging information was found for the patient.   INTERVAL HISTORY:  Ms. Skibinski 85 y.o. female returns for routine follow-up and consideration for next cycle of chemotherapy. She is here today with her family. She states that she is still experiencing diarrhea everyday. She states that she is currently taking 3 antidiarrheal pills in the morning and 2 at night. She was educated on how to take the antidiarrheal medication correctly Denies any nausea, or vomiting. Denies any new pains.  Had not noticed any recent bleeding such as epistaxis, hematuria or hematochezia. Denies recent chest pain on exertion, shortness of breath on minimal exertion, pre-syncopal episodes, or palpitations. Denies any numbness or tingling in hands or feet. Denies any recent fevers, infections, or recent hospitalizations. Patient reports appetite at 75% and energy level at 50%.  Overall, she feels ready for next cycle of treatment today.     REVIEW OF SYSTEMS:  Review of Systems  Respiratory: Positive for shortness of breath.   Gastrointestinal: Positive for diarrhea.     PAST MEDICAL/SURGICAL HISTORY:  Past Medical History:  Diagnosis Date   Cancer [facility])    left breast   History of kidney stones    Hypercholesteremia    Hypertension    Past Surgical History:  Procedure Laterality Date   APPENDECTOMY     brain cyst removed  2011   BREAST BIOPSY Left 03/28/2018   Procedure: BREAST BIOPSY;  Surgeon: [name], MD;  Location: [facility];  Service: General;  Laterality: Left;   CATARACT EXTRACTION W/PHACO Left 11/09/2015   Procedure: CATARACT EXTRACTION PHACO AND INTRAOCULAR LENS PLACEMENT LEFT EYE cde=8.97;  Surgeon: [name], MD;  Location: [facility];  Service: Ophthalmology;  Laterality: Left;   EYE SURGERY     KPE left   MASTECTOMY MODIFIED RADICAL Left 05/28/2018   Procedure: LEFT MODIFIED RADICAL MASTECTOMY;  Surgeon: [name], MD;  Location: [facility];  Service: General;  Laterality: Left;   MASTECTOMY, PARTIAL Left 04/24/2017   Procedure: MASTECTOMY PARTIAL;  Surgeon: [name], MD;  Location: [facility];  Service: General;  Laterality: Left;   PORTACATH PLACEMENT Right 08/08/2018   Procedure: INSERTION PORT-A-CATH;  Surgeon: [name], MD;  Location: [facility];  Service: General;  Laterality: Right;   TONSILLECTOMY     TONSILLECTOMY AND ADENOIDECTOMY       SOCIAL HISTORY:  Social History   Socioeconomic History   Marital status: Widowed    Spouse name: Not  on file   Number of children: Not on file   Years of education: Not on file   Highest education level: Not on file  Occupational History   Not on file  Social Needs   Financial resource strain: Not hard at all   Food insecurity:    Worry: Never true    Inability: Never true   Transportation needs:    Medical: No    Non-medical: No  Tobacco Use   Smoking status: Never Smoker   Smokeless tobacco: Never Used  Substance and Sexual Activity   Alcohol use: No   Drug use: No   Sexual activity: Not Currently    Partners: Male  Lifestyle   Physical activity:    Days per week: Patient refused    Minutes per session: Patient refused   Stress: Not at all  Relationships   Social connections:    Talks on phone: Patient refused    Gets together: Patient refused    Attends religious service: Patient refused    Active member of club or organization: Patient refused    Attends meetings of clubs or organizations: Patient refused    Relationship status: Patient refused   Intimate partner violence:    Fear of current or ex partner: Patient refused    Emotionally abused: Patient refused    Physically abused: Patient refused    Forced sexual activity: Patient refused  Other Topics Concern   Not on file  Social History Narrative   Not on file    FAMILY HISTORY:  Family History  Problem Relation Age of Onset   Heart failure Mother    Heart failure Father    Congestive Heart Failure Brother    Tuberculosis Paternal Uncle    Tuberculosis Maternal Grandmother    Tuberculosis Maternal Grandfather    Congestive Heart Failure Brother     CURRENT MEDICATIONS:  Outpatient Encounter Medications as of 11/08/2018  Medication Sig   amLODipine (NORVASC) 5 MG tablet Take 5 mg by mouth daily.   aspirin EC 81 MG tablet Take 1 tablet (81 mg total) by mouth daily.   ferrous sulfate 325 (65 FE) MG tablet Take 325 mg by mouth daily with breakfast.   labetalol  (NORMODYNE) 100 MG tablet Take 1 tablet (100 mg total) by mouth 2 (two) times daily.   naproxen sodium (ALEVE) 220 MG tablet Take 220 mg by mouth daily as needed (for headache or pain).   oxybutynin (DITROPAN) 5 MG tablet Take 5 mg by mouth daily.    prochlorperazine (COMPAZINE) 10 MG tablet Take 1 tablet (10 mg total) by mouth every 6 (six) hours as needed (Nausea or vomiting).   simvastatin (ZOCOR) 40 MG tablet Take 40 mg by mouth daily.   Trastuzumab (HERCEPTIN IV) Inject 525 mg into the vein every 21 ( twenty-one) days.   No facility-administered encounter medications on file as of 11/08/2018.     ALLERGIES:  No Known Allergies   PHYSICAL EXAM:  ECOG Performance status: 1  Blood pressure is 147/42.  Pulse rate is 50.  Respiratory rate is  18.  Temperature is 97.6 and saturations are 98%.  Physical Exam Constitutional:      Appearance: Normal appearance.  Cardiovascular:     Rate and Rhythm: Normal rate and regular rhythm.  Pulmonary:     Effort: Pulmonary effort is normal.     Breath sounds: Normal breath sounds.  Abdominal:     General: Bowel sounds are normal.     Palpations: Abdomen is soft.  Musculoskeletal:        General: No swelling.  Neurological:     General: No focal deficit present.     Mental Status: She is alert and oriented to person, place, and time.  Psychiatric:        Mood and Affect: Mood normal.        Behavior: Behavior normal.      LABORATORY DATA:  I have reviewed the labs as listed.  CBC    Component Value Date/Time   WBC 4.0 11/08/2018 0915   RBC 3.59 (L) 11/08/2018 0915   HGB 10.7 (L) 11/08/2018 0915   HCT 34.6 (L) 11/08/2018 0915   PLT 190 11/08/2018 0915   MCV 96.4 11/08/2018 0915   MCH 29.8 11/08/2018 0915   MCHC 30.9 11/08/2018 0915   RDW 13.0 11/08/2018 0915   LYMPHSABS 0.6 (L) 11/08/2018 0915   MONOABS 0.3 11/08/2018 0915   EOSABS 0.3 11/08/2018 0915   BASOSABS 0.1 11/08/2018 0915   CMP Latest Ref Rng & Units  11/08/2018 10/18/2018 09/27/2018  Glucose 70 - 99 mg/dL 135(H) 131(H) 133(H)  BUN 8 - 23 mg/dL 28(H) 27(H) 32(H)  Creatinine 0.44 - 1.00 mg/dL 1.16(H) 1.33(H) 1.42(H)  Sodium 135 - 145 mmol/L 140 140 139  Potassium 3.5 - 5.1 mmol/L 4.7 4.8 4.6  Chloride 98 - 111 mmol/L 108 108 109  CO2 22 - 32 mmol/L '24 23 24  ' Calcium 8.9 - 10.3 mg/dL 9.0 9.0 8.9  Total Protein 6.5 - 8.1 g/dL 5.9(L) 6.0(L) 6.1(L)  Total Bilirubin 0.3 - 1.2 mg/dL 0.4 0.5 0.6  Alkaline Phos 38 - 126 U/L 55 42 48  AST 15 - 41 U/L '16 18 18  ' ALT 0 - 44 U/L '14 15 15       ' DIAGNOSTIC IMAGING:  I have independently reviewed the scans and discussed with the patient.   I have reviewed Venita Lick LPN's note and agree with the documentation.  I personally performed a face-to-face visit, made revisions and my assessment and plan is as follows.    ASSESSMENT & PLAN:   Invasive ductal carcinoma of left breast ([place]) 1.  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  No lymph node sampling was done. - In July 2019, she noticed a lump at her prior lumpectomy site, which was painful.  She did not want to have mammogram done. -Biopsy of this lump on 03/28/2018 by Dr. Arnoldo Morale showed invasive ductal carcinoma with calcifications (2.2 cm), ER negative, PR negative, Ki-67 40%, HER-2 3+ positive by IHC.  3/3 lymph nodes positive. - Left modified radical mastectomy on 05/28/2018.  -PET/CT scan on 07/23/2018 shows few scattered mildly hypermetabolic pulmonary nodules, hypermetabolic left axillary, right paratracheal, right hilar, subcarinal adenopathy compatible with metastatic disease in the chest.  -2D echo on 08/03/2018  shows EF of 65 to 70%.   -  4 cycles of single agent Herceptin every 3 weeks from 08/09/2018 through 10/18/2018. -She is continuing to maintain her active lifestyle and lives independently at home. -She has slight diarrhea and takes Imodium up to 5 tablets/day.  She is taking 3 tablets in the morning and 2 tablets in the afternoon. - We discussed the results of PET CT scan dated 10/30/2018 which shows mild progression of pulmonary metastasis.  Stable mediastinal lymph nodes.  2 adjacent hypermetabolic left axillary lymph nodes, previously only 1 lymph node noted.  No other evidence of metastasis in the abdomen or pelvis or skeleton. - I have recommended adding Pertuzumab to the regimen as she has very mild progression with single agent Herceptin.  We will get prior authorization from her insurance.  We discussed about the side effects of Pertuzumab in detail. -I will see her back in 3 weeks after next treatment with combination.  I plan to obtain a with 2D echocardiogram prior to next visit.  2.  Diarrhea: -This is Herceptin induced.  She was told to take Imodium 2 tablets at the first onset of watery diarrhea, followed by 1 tablet after each watery bowel movement. -Limited tablets per day.  Total time spent is 40 minutes with more than 50% of the time spent face-to-face discussing scan results, treatment options, side effects and coordination of care.    Orders placed this encounter:  Orders Placed This Encounter  Procedures   CBC with Differential/Platelet   Comprehensive metabolic panel   Magnesium   ECHO TEE (2 D Echo)   ECHOCARDIOGRAM COMPLETE      Derek Jack, MD [place] [phone]

## 2018-11-08 NOTE — Patient Instructions (Signed)
[place] at [facility] Discharge Instructions  You were seen today by Dr. Delton Coombes. He went over your recent scan results. He also discussed starting you on a new medication. He will see you back in 3 weeks from your new treatment for labs, follow up and treatment.    Thank you for choosing [place] at [facility] to provide your oncology and hematology care.  To afford each patient quality time with our provider, please arrive at least 15 minutes before your scheduled appointment time.   If you have a lab appointment with the [place] please come in thru the  Main Entrance and check in at the main information desk  You need to re-schedule your appointment should you arrive 10 or more minutes late.  We strive to give you quality time with our providers, and arriving late affects you and other patients whose appointments are after yours.  Also, if you no show three or more times for appointments you may be dismissed from the clinic at the providers discretion.     Again, thank you for choosing [facility].  Our hope is that these requests will decrease the amount of time that you wait before being seen by our physicians.       _____________________________________________________________  Should you have questions after your visit to [facility], please contact our office at [phone] between the hours of 8:00 a.m. and 4:30 p.m.  Voicemails left after 4:00 p.m. will not be returned until the following business day.  For prescription refill requests, have your pharmacy contact our office and allow 72 hours.    Cancer Center Support Programs:   > Cancer Support Group  2nd Tuesday of the month 1pm-2pm, Journey Room

## 2018-11-09 LAB — CANCER ANTIGEN 15-3: CA 15-3: 6.5 U/mL (ref 0.0–25.0)

## 2018-11-09 LAB — CANCER ANTIGEN 27.29: CA 27.29: 7.4 U/mL (ref 0.0–38.6)

## 2018-11-13 ENCOUNTER — Other Ambulatory Visit (HOSPITAL_COMMUNITY): Payer: Self-pay | Admitting: Hematology

## 2018-11-15 ENCOUNTER — Inpatient Hospital Stay (HOSPITAL_COMMUNITY): Payer: Medicare Other

## 2018-11-15 ENCOUNTER — Other Ambulatory Visit: Payer: Self-pay

## 2018-11-15 ENCOUNTER — Encounter (HOSPITAL_COMMUNITY): Payer: Self-pay

## 2018-11-15 VITALS — BP 141/60 | HR 55 | Temp 98.6°F | Resp 16 | Wt 135.2 lb

## 2018-11-15 DIAGNOSIS — C50912 Malignant neoplasm of unspecified site of left female breast: Secondary | ICD-10-CM | POA: Diagnosis not present

## 2018-11-15 DIAGNOSIS — Z5112 Encounter for antineoplastic immunotherapy: Secondary | ICD-10-CM | POA: Diagnosis not present

## 2018-11-15 DIAGNOSIS — Z171 Estrogen receptor negative status [ER-]: Secondary | ICD-10-CM | POA: Diagnosis not present

## 2018-11-15 LAB — COMPREHENSIVE METABOLIC PANEL
ALT: 17 U/L (ref 0–44)
AST: 19 U/L (ref 15–41)
Albumin: 3.6 g/dL (ref 3.5–5.0)
Alkaline Phosphatase: 48 U/L (ref 38–126)
Anion gap: 8 (ref 5–15)
BUN: 29 mg/dL — ABNORMAL HIGH (ref 8–23)
CO2: 24 mmol/L (ref 22–32)
Calcium: 9 mg/dL (ref 8.9–10.3)
Chloride: 110 mmol/L (ref 98–111)
Creatinine, Ser: 1.25 mg/dL — ABNORMAL HIGH (ref 0.44–1.00)
GFR calc Af Amer: 44 mL/min — ABNORMAL LOW (ref >60)
GFR calc non Af Amer: 38 mL/min — ABNORMAL LOW (ref >60)
Glucose, Bld: 136 mg/dL — ABNORMAL HIGH (ref 70–99)
Potassium: 4.5 mmol/L (ref 3.5–5.1)
Sodium: 142 mmol/L (ref 135–145)
Total Bilirubin: 0.5 mg/dL (ref 0.3–1.2)
Total Protein: 5.8 g/dL — ABNORMAL LOW (ref 6.5–8.1)

## 2018-11-15 LAB — CBC WITH DIFFERENTIAL/PLATELET
Abs Immature Granulocytes: 0.01 10*3/uL (ref 0.00–0.07)
BASOS ABS: 0 10*3/uL (ref 0.0–0.1)
Basophils Relative: 1 %
Eosinophils Absolute: 0.2 10*3/uL (ref 0.0–0.5)
Eosinophils Relative: 7 %
HCT: 34.2 % — ABNORMAL LOW (ref 36.0–46.0)
Hemoglobin: 10.7 g/dL — ABNORMAL LOW (ref 12.0–15.0)
Immature Granulocytes: 0 %
Lymphocytes Relative: 19 %
Lymphs Abs: 0.7 10*3/uL (ref 0.7–4.0)
MCH: 30.1 pg (ref 26.0–34.0)
MCHC: 31.3 g/dL (ref 30.0–36.0)
MCV: 96.3 fL (ref 80.0–100.0)
Monocytes Absolute: 0.4 10*3/uL (ref 0.1–1.0)
Monocytes Relative: 11 %
NRBC: 0 % (ref 0.0–0.2)
Neutro Abs: 2.3 10*3/uL (ref 1.7–7.7)
Neutrophils Relative %: 62 %
Platelets: 189 10*3/uL (ref 150–400)
RBC: 3.55 MIL/uL — ABNORMAL LOW (ref 3.87–5.11)
RDW: 13.2 % (ref 11.5–15.5)
WBC: 3.7 10*3/uL — AB (ref 4.0–10.5)

## 2018-11-15 LAB — MAGNESIUM: Magnesium: 2.2 mg/dL (ref 1.7–2.4)

## 2018-11-15 MED ORDER — SODIUM CHLORIDE 0.9% FLUSH
10.0000 mL | INTRAVENOUS | Status: DC | PRN
  Administered 2018-11-15: 10 mL
  Filled 2018-11-15: qty 10

## 2018-11-15 MED ORDER — ACETAMINOPHEN 325 MG PO TABS
650.0000 mg | ORAL_TABLET | Freq: Once | ORAL | Status: AC
  Administered 2018-11-15: 650 mg via ORAL

## 2018-11-15 MED ORDER — SODIUM CHLORIDE 0.9 % IV SOLN
840.0000 mg | Freq: Once | INTRAVENOUS | Status: AC
  Administered 2018-11-15: 840 mg via INTRAVENOUS
  Filled 2018-11-15: qty 28

## 2018-11-15 MED ORDER — SODIUM CHLORIDE 0.9 % IV SOLN
Freq: Once | INTRAVENOUS | Status: AC
  Administered 2018-11-15: 10:00:00 via INTRAVENOUS

## 2018-11-15 MED ORDER — ACETAMINOPHEN 325 MG PO TABS
ORAL_TABLET | ORAL | Status: AC
  Filled 2018-11-15: qty 2

## 2018-11-15 MED ORDER — DIPHENHYDRAMINE HCL 25 MG PO CAPS
25.0000 mg | ORAL_CAPSULE | Freq: Once | ORAL | Status: AC
  Administered 2018-11-15: 25 mg via ORAL

## 2018-11-15 MED ORDER — TRASTUZUMAB CHEMO 150 MG IV SOLR
6.0000 mg/kg | Freq: Once | INTRAVENOUS | Status: AC
  Administered 2018-11-15: 378 mg via INTRAVENOUS
  Filled 2018-11-15: qty 18

## 2018-11-15 MED ORDER — HEPARIN SOD (PORK) LOCK FLUSH 100 UNIT/ML IV SOLN
500.0000 [IU] | Freq: Once | INTRAVENOUS | Status: AC | PRN
  Administered 2018-11-15: 500 [IU]

## 2018-11-15 MED ORDER — DIPHENHYDRAMINE HCL 25 MG PO CAPS
ORAL_CAPSULE | ORAL | Status: AC
  Filled 2018-11-15: qty 1

## 2018-11-15 NOTE — Progress Notes (Signed)
Labs reviewed with MD. BUN and Creatinine noted. 2d-echo ordered and scheduled already, proceed with treatment per MD.   Treatment given per orders. Patient tolerated it well without problems. Vitals stable and discharged home from clinic ambulatory. Follow up as scheduled.

## 2018-11-15 NOTE — Patient Instructions (Signed)
[facility] Discharge Instructions for Patients Receiving Chemotherapy  Today you received the following chemotherapy agents   To help prevent nausea and vomiting after your treatment, we encourage you to take your nausea medication   If you develop nausea and vomiting that is not controlled by your nausea medication, call the clinic.   BELOW ARE SYMPTOMS THAT SHOULD BE REPORTED IMMEDIATELY:  *FEVER GREATER THAN 100.5 F  *CHILLS WITH OR WITHOUT FEVER  NAUSEA AND VOMITING THAT IS NOT CONTROLLED WITH YOUR NAUSEA MEDICATION  *UNUSUAL SHORTNESS OF BREATH  *UNUSUAL BRUISING OR BLEEDING  TENDERNESS IN MOUTH AND THROAT WITH OR WITHOUT PRESENCE OF ULCERS  *URINARY PROBLEMS  *BOWEL PROBLEMS  UNUSUAL RASH Items with * indicate a potential emergency and should be followed up as soon as possible.  Feel free to call the clinic should you have any questions or concerns. The clinic phone number is [phone].  Please show the CHEMO ALERT CARD at check-in to the Emergency Department and triage nurse.   

## 2018-11-19 ENCOUNTER — Other Ambulatory Visit (HOSPITAL_COMMUNITY): Payer: Medicare Other

## 2018-12-04 ENCOUNTER — Other Ambulatory Visit (HOSPITAL_COMMUNITY): Payer: Medicare Other

## 2018-12-06 ENCOUNTER — Other Ambulatory Visit: Payer: Self-pay

## 2018-12-06 ENCOUNTER — Inpatient Hospital Stay (HOSPITAL_COMMUNITY): Payer: Medicare Other | Attending: Hematology

## 2018-12-06 ENCOUNTER — Inpatient Hospital Stay (HOSPITAL_BASED_OUTPATIENT_CLINIC_OR_DEPARTMENT_OTHER): Payer: Medicare Other | Admitting: Hematology

## 2018-12-06 ENCOUNTER — Inpatient Hospital Stay (HOSPITAL_COMMUNITY): Payer: Medicare Other

## 2018-12-06 ENCOUNTER — Encounter (HOSPITAL_COMMUNITY): Payer: Self-pay | Admitting: Hematology

## 2018-12-06 VITALS — BP 127/89 | HR 55 | Temp 98.6°F | Resp 16 | Wt 133.6 lb

## 2018-12-06 DIAGNOSIS — C50912 Malignant neoplasm of unspecified site of left female breast: Secondary | ICD-10-CM

## 2018-12-06 DIAGNOSIS — K5903 Drug induced constipation: Secondary | ICD-10-CM | POA: Insufficient documentation

## 2018-12-06 DIAGNOSIS — Z7982 Long term (current) use of aspirin: Secondary | ICD-10-CM | POA: Insufficient documentation

## 2018-12-06 DIAGNOSIS — I10 Essential (primary) hypertension: Secondary | ICD-10-CM

## 2018-12-06 DIAGNOSIS — Z79899 Other long term (current) drug therapy: Secondary | ICD-10-CM

## 2018-12-06 DIAGNOSIS — Z171 Estrogen receptor negative status [ER-]: Secondary | ICD-10-CM | POA: Diagnosis not present

## 2018-12-06 DIAGNOSIS — Z5112 Encounter for antineoplastic immunotherapy: Secondary | ICD-10-CM | POA: Insufficient documentation

## 2018-12-06 DIAGNOSIS — C7800 Secondary malignant neoplasm of unspecified lung: Secondary | ICD-10-CM | POA: Insufficient documentation

## 2018-12-06 LAB — CBC WITH DIFFERENTIAL/PLATELET
Abs Immature Granulocytes: 0.01 10*3/uL (ref 0.00–0.07)
Basophils Absolute: 0 10*3/uL (ref 0.0–0.1)
Basophils Relative: 1 %
Eosinophils Absolute: 0.2 10*3/uL (ref 0.0–0.5)
Eosinophils Relative: 5 %
HCT: 31.8 % — ABNORMAL LOW (ref 36.0–46.0)
Hemoglobin: 10 g/dL — ABNORMAL LOW (ref 12.0–15.0)
Immature Granulocytes: 0 %
Lymphocytes Relative: 20 %
Lymphs Abs: 0.8 10*3/uL (ref 0.7–4.0)
MCH: 29.7 pg (ref 26.0–34.0)
MCHC: 31.4 g/dL (ref 30.0–36.0)
MCV: 94.4 fL (ref 80.0–100.0)
Monocytes Absolute: 0.4 10*3/uL (ref 0.1–1.0)
Monocytes Relative: 10 %
Neutro Abs: 2.4 10*3/uL (ref 1.7–7.7)
Neutrophils Relative %: 64 %
Platelets: 159 10*3/uL (ref 150–400)
RBC: 3.37 MIL/uL — ABNORMAL LOW (ref 3.87–5.11)
RDW: 13.2 % (ref 11.5–15.5)
WBC: 3.8 10*3/uL — ABNORMAL LOW (ref 4.0–10.5)
nRBC: 0 % (ref 0.0–0.2)

## 2018-12-06 LAB — COMPREHENSIVE METABOLIC PANEL
ALT: 16 U/L (ref 0–44)
AST: 19 U/L (ref 15–41)
Albumin: 3.6 g/dL (ref 3.5–5.0)
Alkaline Phosphatase: 51 U/L (ref 38–126)
Anion gap: 9 (ref 5–15)
BUN: 27 mg/dL — ABNORMAL HIGH (ref 8–23)
CO2: 23 mmol/L (ref 22–32)
Calcium: 8.9 mg/dL (ref 8.9–10.3)
Chloride: 108 mmol/L (ref 98–111)
Creatinine, Ser: 1.11 mg/dL — ABNORMAL HIGH (ref 0.44–1.00)
GFR calc Af Amer: 50 mL/min — ABNORMAL LOW (ref >60)
GFR calc non Af Amer: 43 mL/min — ABNORMAL LOW (ref >60)
Glucose, Bld: 131 mg/dL — ABNORMAL HIGH (ref 70–99)
Potassium: 4.3 mmol/L (ref 3.5–5.1)
Sodium: 140 mmol/L (ref 135–145)
Total Bilirubin: 0.4 mg/dL (ref 0.3–1.2)
Total Protein: 5.8 g/dL — ABNORMAL LOW (ref 6.5–8.1)

## 2018-12-06 MED ORDER — SODIUM CHLORIDE 0.9% FLUSH
10.0000 mL | INTRAVENOUS | Status: DC | PRN
  Administered 2018-12-06: 10 mL
  Filled 2018-12-06: qty 10

## 2018-12-06 MED ORDER — DIPHENHYDRAMINE HCL 25 MG PO CAPS
ORAL_CAPSULE | ORAL | Status: AC
  Filled 2018-12-06: qty 1

## 2018-12-06 MED ORDER — SODIUM CHLORIDE 0.9 % IV SOLN
Freq: Once | INTRAVENOUS | Status: AC
  Administered 2018-12-06: 11:00:00 via INTRAVENOUS

## 2018-12-06 MED ORDER — SODIUM CHLORIDE 0.9 % IV SOLN
420.0000 mg | Freq: Once | INTRAVENOUS | Status: AC
  Administered 2018-12-06: 420 mg via INTRAVENOUS
  Filled 2018-12-06: qty 14

## 2018-12-06 MED ORDER — TRASTUZUMAB CHEMO 150 MG IV SOLR
6.0000 mg/kg | Freq: Once | INTRAVENOUS | Status: AC
  Administered 2018-12-06: 378 mg via INTRAVENOUS
  Filled 2018-12-06: qty 18

## 2018-12-06 MED ORDER — ACETAMINOPHEN 325 MG PO TABS
650.0000 mg | ORAL_TABLET | Freq: Once | ORAL | Status: AC
  Administered 2018-12-06: 650 mg via ORAL

## 2018-12-06 MED ORDER — ACETAMINOPHEN 325 MG PO TABS
ORAL_TABLET | ORAL | Status: AC
  Filled 2018-12-06: qty 2

## 2018-12-06 MED ORDER — HEPARIN SOD (PORK) LOCK FLUSH 100 UNIT/ML IV SOLN: 500.0000 [IU] | Freq: Once | INTRAVENOUS | Status: DC | PRN

## 2018-12-06 MED ORDER — DIPHENHYDRAMINE HCL 25 MG PO CAPS
25.0000 mg | ORAL_CAPSULE | Freq: Once | ORAL | Status: AC
  Administered 2018-12-06: 25 mg via ORAL

## 2018-12-06 NOTE — Progress Notes (Signed)
Labs reviewed with patient at office visit today, no new issues reported by patient . Proceed with treatment per MD.

## 2018-12-06 NOTE — Patient Instructions (Signed)
[place] at [facility] Discharge Instructions  You were seen today by Dr. Delton Coombes. He discussed your lab results and everything looks well.  He wants you to keep being active.  You will proceed with treatment today.    Thank you for choosing [place] at [facility] to provide your oncology and hematology care.  To afford each patient quality time with our provider, please arrive at least 15 minutes before your scheduled appointment time.   If you have a lab appointment with the [place] please come in thru the  Main Entrance and check in at the main information desk  You need to re-schedule your appointment should you arrive 10 or more minutes late.  We strive to give you quality time with our providers, and arriving late affects you and other patients whose appointments are after yours.  Also, if you no show three or more times for appointments you may be dismissed from the clinic at the providers discretion.     Again, thank you for choosing [facility].  Our hope is that these requests will decrease the amount of time that you wait before being seen by our physicians.       _____________________________________________________________  Should you have questions after your visit to [facility], please contact our office at [phone] between the hours of 8:00 a.m. and 4:30 p.m.  Voicemails left after 4:00 p.m. will not be returned until the following business day.  For prescription refill requests, have your pharmacy contact our office and allow 72 hours.    Cancer Center Support Programs:   > Cancer Support Group  2nd Tuesday of the month 1pm-2pm, Journey Room

## 2018-12-06 NOTE — Assessment & Plan Note (Signed)
1.  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  No lymph node sampling was done. - In July 2019, she noticed a lump at her prior lumpectomy site, which was painful.  She did not want to have mammogram done. -Biopsy of this lump on 03/28/2018 by Dr. Jenkins showed invasive ductal carcinoma with calcifications (2.2 cm), ER negative, PR negative, Ki-67 40%, HER-2 3+ positive by IHC.  3/3 lymph nodes positive. - Left modified radical mastectomy on 05/28/2018.  -PET/CT scan on 07/23/2018 shows few scattered mildly hypermetabolic pulmonary nodules, hypermetabolic left axillary, right paratracheal, right hilar, subcarinal adenopathy compatible with metastatic disease in the chest.  -2D echo on 08/03/2018 shows EF of 65 to 70%.   - 4 cycles of single agent Herceptin every 3 weeks from 08/09/2018 through 10/18/2018. -She is continuing to maintain her active lifestyle and lives independently at home. -She has slight diarrhea and takes Imodium up to 5 tablets/day.  She is taking 3 tablets in the morning and 2 tablets in the afternoon. - PET CT scan on 10/30/2018 showed mild progression of pulmonary metastasis.  Stable mediastinal lymph nodes.  2 adjacent hypermetabolic left axillary lymph nodes, previously only 1 lymph node noted.  No other evidence of metastasis in the abdomen or pelvis or skeleton. -She was started on Perjeta and Herceptin on 11/15/2018. -She has tolerated first cycle with Perjeta very well.  No additional diarrhea was reported.  No symptoms of PND or orthopnea. -She may proceed with cycle 2 today.  I reviewed her blood work.  We will arrange 2D echocardiogram. -She will be seen back in 3 weeks for  follow-up.  2.  Diarrhea: She had mild diarrhea from Herceptin.  This is very well controlled at present time.  She takes Imodium as needed.  

## 2018-12-06 NOTE — Progress Notes (Signed)
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 breast cancer  CURRENT THERAPY: Herceptin / Perjeta  BRIEF ONCOLOGIC HISTORY:    Invasive ductal carcinoma of left breast ([place])   03/30/2017 Initial Diagnosis    Invasive ductal carcinoma of left breast ([place])    08/09/2018 -  Chemotherapy    The patient had trastuzumab (HERCEPTIN) 500 mg in sodium chloride 0.9 % 250 mL chemo infusion, 525 mg, Intravenous,  Once, 6 of 10 cycles Administration: 500 mg (08/09/2018), 350 mg (09/06/2018), 350 mg (09/27/2018), 378 mg (10/18/2018), 378 mg (11/15/2018), 378 mg (12/06/2018) pertuzumab (PERJETA) 840 mg in sodium chloride 0.9 % 250 mL chemo infusion, 840 mg (100 % of original dose 840 mg), Intravenous, Once, 2 of 6 cycles Dose modification: 840 mg (original dose 840 mg, Cycle 5), 420 mg (original dose 420 mg, Cycle 6) Administration: 840 mg (11/15/2018), 420 mg (12/06/2018)  for chemotherapy treatment.     09/18/2018 Genetic Testing    Negative genetic testing for the breast and gyn cancer panel.  The Breast/GYN gene panel offered by [facility] includes sequencing and rearrangement analysis for the following 23 genes:  ATM, BRCA1, BRCA2, BRIP1, CDH1, CHEK2, EPCAM, MLH1, MSH2, MSH6, NBN, NF1, PALB2, PMS2, PTEN, RAD51C, RAD51D, STK11, and TP53.   The report date is 09/18/2018.      CANCER STAGING: Cancer Staging No matching staging information was found for the patient.   INTERVAL HISTORY:  Carol Duarte 85 y.o. female returns for routine follow-up.  She is here alone today.  She reports feeling well.  She said that she has a little decrease in her energy but is doing well otherwise.  She said that her ankles swell some but that is her normal.  Denies any nausea, vomiting, or diarrhea. Denies any new  pains. Had not noticed any recent bleeding such as epistaxis, hematuria or hematochezia. Denies recent chest pain on exertion, shortness of breath on minimal exertion, pre-syncopal episodes, or palpitations. Denies any numbness or tingling in hands or feet. Denies any recent fevers, infections, or recent hospitalizations. Patient reports appetite at 100% and energy level at 75%.      REVIEW OF SYSTEMS:  Review of Systems  Gastrointestinal: Positive for diarrhea.  All other systems reviewed and are negative.    PAST MEDICAL/SURGICAL HISTORY:  Past Medical History:  Diagnosis Date  . Cancer ([place])    left breast  . History of kidney stones   . Hypercholesteremia   . Hypertension    Past Surgical History:  Procedure Laterality Date  . APPENDECTOMY    . brain cyst removed  2011  . BREAST BIOPSY Left 03/28/2018   Procedure: BREAST BIOPSY;  Surgeon: [name], MD;  Location: [facility];  Service: General;  Laterality: Left;  . CATARACT EXTRACTION W/PHACO Left 11/09/2015   Procedure: CATARACT EXTRACTION PHACO AND INTRAOCULAR LENS PLACEMENT LEFT EYE cde=8.97;  Surgeon: [name], MD;  Location: [facility];  Service: Ophthalmology;  Laterality: Left;  . EYE SURGERY     KPE left  . MASTECTOMY MODIFIED RADICAL Left 05/28/2018   Procedure: LEFT MODIFIED RADICAL MASTECTOMY;  Surgeon: [name], MD;  Location: [facility];  Service: General;  Laterality: Left;  Marland Kitchen MASTECTOMY, PARTIAL Left 04/24/2017   Procedure: MASTECTOMY PARTIAL;  Surgeon: [name], MD;  Location: [facility];  Service: General;  Laterality: Left;  . PORTACATH PLACEMENT Right 08/08/2018   Procedure: INSERTION PORT-A-CATH;  Surgeon: [name], MD;  Location: [facility];  Service: General;  Laterality: Right;  . TONSILLECTOMY    . TONSILLECTOMY AND ADENOIDECTOMY       SOCIAL HISTORY:  Social History   Socioeconomic History  . Marital status: Widowed    Spouse name: Not on file  . Number of children: Not on file  . Years of  education: Not on file  . Highest education level: Not on file  Occupational History  . Not on file  Social Needs  . Financial resource strain: Not hard at all  . Food insecurity:    Worry: Never true    Inability: Never true  . Transportation needs:    Medical: No    Non-medical: No  Tobacco Use  . Smoking status: Never Smoker  . Smokeless tobacco: Never Used  Substance and Sexual Activity  . Alcohol use: No  . Drug use: No  . Sexual activity: Not Currently    Partners: Male  Lifestyle  . Physical activity:    Days per week: Patient refused    Minutes per session: Patient refused  . Stress: Not at all  Relationships  . Social connections:    Talks on phone: Patient refused    Gets together: Patient refused    Attends religious service: Patient refused    Active member of club or organization: Patient refused    Attends meetings of clubs or organizations: Patient refused    Relationship status: Patient refused  . Intimate partner violence:    Fear of current or ex partner: Patient refused    Emotionally abused: Patient refused    Physically abused: Patient refused    Forced sexual activity: Patient refused  Other Topics Concern  . Not on file  Social History Narrative  . Not on file    FAMILY HISTORY:  Family History  Problem Relation Age of Onset  . Heart failure Mother   . Heart failure Father   . Congestive Heart Failure Brother   . Tuberculosis Paternal Uncle   . Tuberculosis Maternal Grandmother   . Tuberculosis Maternal Grandfather   . Congestive Heart Failure Brother     CURRENT MEDICATIONS:  Outpatient Encounter Medications as of 12/06/2018  Medication Sig  . amLODipine (NORVASC) 5 MG tablet Take 5 mg by mouth daily.  Marland Kitchen aspirin EC 81 MG tablet Take 1 tablet (81 mg total) by mouth daily.  . ferrous sulfate 325 (65 FE) MG tablet Take 325 mg by mouth daily with breakfast.  . labetalol (NORMODYNE) 100 MG tablet Take 1 tablet (100 mg total) by mouth 2  (two) times daily.  Marland Kitchen LORazepam (ATIVAN) 1 MG tablet TAKE ONE TABLET BY MOUTH AS A ONE TIME DOSE 45 MINUTES PRIOR TO YOUR SCAN  . naproxen sodium (ALEVE) 220 MG tablet Take 220 mg by mouth daily as needed (for headache or pain).  Marland Kitchen oxybutynin (DITROPAN) 5 MG tablet Take 5 mg by mouth daily.   . prochlorperazine (COMPAZINE) 10 MG tablet Take 1 tablet (10 mg total) by mouth every 6 (six) hours as needed (Nausea or vomiting).  . simvastatin (ZOCOR) 40 MG tablet Take 40 mg by mouth daily.  . Trastuzumab (HERCEPTIN IV) Inject 525 mg into the vein every 21 ( twenty-one) days.   No facility-administered encounter medications on file as of 12/06/2018.     ALLERGIES:  No Known Allergies  PHYSICAL EXAM:  ECOG Performance status: 1 Blood pressure is 136/44.  Pulse is 50.  Respirate is 18. Physical Exam Vitals signs reviewed.  Constitutional:      Appearance: Normal appearance.  Cardiovascular:     Rate and Rhythm: Normal rate and regular rhythm.     Heart sounds: Normal heart sounds.  Pulmonary:     Effort: Pulmonary effort is normal.     Breath sounds: Normal breath sounds.  Abdominal:     General: Bowel sounds are normal. There is no distension.     Palpations: Abdomen is soft. There is no mass.  Musculoskeletal:        General: No swelling.  Skin:    General: Skin is warm.  Neurological:     General: No focal deficit present.     Mental Status: She is alert and oriented to person, place, and time.  Psychiatric:        Mood and Affect: Mood normal.        Behavior: Behavior normal.      LABORATORY DATA:  I have reviewed the labs as listed.  CBC    Component Value Date/Time   WBC 3.8 (L) 12/06/2018 0926   RBC 3.37 (L) 12/06/2018 0926   HGB 10.0 (L) 12/06/2018 0926   HCT 31.8 (L) 12/06/2018 0926   PLT 159 12/06/2018 0926   MCV 94.4 12/06/2018 0926   MCH 29.7 12/06/2018 0926   MCHC 31.4 12/06/2018 0926   RDW 13.2 12/06/2018 0926   LYMPHSABS 0.8 12/06/2018 0926   MONOABS  0.4 12/06/2018 0926   EOSABS 0.2 12/06/2018 0926   BASOSABS 0.0 12/06/2018 0926   CMP Latest Ref Rng & Units 12/06/2018 11/15/2018 11/08/2018  Glucose 70 - 99 mg/dL 131(H) 136(H) 135(H)  BUN 8 - 23 mg/dL 27(H) 29(H) 28(H)  Creatinine 0.44 - 1.00 mg/dL 1.11(H) 1.25(H) 1.16(H)  Sodium 135 - 145 mmol/L 140 142 140  Potassium 3.5 - 5.1 mmol/L 4.3 4.5 4.7  Chloride 98 - 111 mmol/L 108 110 108  CO2 22 - 32 mmol/L _0 Calcium 8.9 - 10.3 mg/dL 8.9 9.0 9.0  Total Protein 6.5 - 8.1 g/dL 5.8(L) 5.8(L) 5.9(L)  Total Bilirubin 0.3 - 1.2 mg/dL 0.4 0.5 0.4  Alkaline Phos 38 - 126 U/L 51 48 55  AST 15 - 41 U/L _1 ALT 0 - 44 U/L _2 DIAGNOSTIC IMAGING:  I have independently reviewed the scans and discussed with the patient.   I have reviewed Jene Every, RN's note and agree with the documentation.  I personally performed a face-to-face visit, made revisions and my assessment and plan is as follows.    ASSESSMENT & PLAN:   Invasive ductal carcinoma of left breast ([place]) 1.  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  No lymph node sampling was done. - In July 2019, she noticed a lump at her prior lumpectomy site, which was painful.  She did not want to have mammogram done. -Biopsy of this lump on 03/28/2018 by Dr. Arnoldo Morale showed invasive ductal carcinoma with calcifications (2.2 cm), ER negative, PR negative, Ki-67 40%, HER-2 3+ positive by IHC.  3/3 lymph nodes positive. -  Left modified radical mastectomy on 05/28/2018.  -PET/CT scan on 07/23/2018 shows few scattered mildly hypermetabolic pulmonary nodules, hypermetabolic left axillary, right paratracheal, right hilar, subcarinal  adenopathy compatible with metastatic disease in the chest.  -2D echo on 08/03/2018 shows EF of 65 to 70%.   - 4 cycles of single agent Herceptin every 3 weeks from 08/09/2018 through 10/18/2018. -She is continuing to maintain her active lifestyle and lives independently at home. -She has slight diarrhea and takes Imodium up to 5 tablets/day.  She is taking 3 tablets in the morning and 2 tablets in the afternoon. - PET CT scan on 10/30/2018 showed mild progression of pulmonary metastasis.  Stable mediastinal lymph nodes.  2 adjacent hypermetabolic left axillary lymph nodes, previously only 1 lymph node noted.  No other evidence of metastasis in the abdomen or pelvis or skeleton. -She was started on Perjeta and Herceptin on 11/15/2018. -She has tolerated first cycle with Perjeta very well.  No additional diarrhea was reported.  No symptoms of PND or orthopnea. -She may proceed with cycle 2 today.  I reviewed her blood work.  We will arrange 2D echocardiogram. -She will be seen back in 3 weeks for follow-up.  2.  Diarrhea: She had mild diarrhea from Herceptin.  This is very well controlled at present time.  She takes Imodium as needed.   Total time spent is 25 minutes with more than 50% of the time spent face-to-face discussing treatment related side effects and coordination of care.    Orders placed this encounter:  No orders of the defined types were placed in this encounter.     Derek Jack, MD [place] [phone]

## 2018-12-10 ENCOUNTER — Ambulatory Visit (HOSPITAL_COMMUNITY): Admission: RE | Admit: 2018-12-10 | Payer: Medicare Other | Source: Home / Self Care | Admitting: Ophthalmology

## 2018-12-10 ENCOUNTER — Encounter (HOSPITAL_COMMUNITY): Admission: RE | Payer: Self-pay | Source: Home / Self Care

## 2018-12-10 SURGERY — PHACOEMULSIFICATION, CATARACT, WITH IOL INSERTION
Anesthesia: Monitor Anesthesia Care | Laterality: Right

## 2018-12-27 DIAGNOSIS — E785 Hyperlipidemia, unspecified: Secondary | ICD-10-CM | POA: Diagnosis not present

## 2018-12-27 DIAGNOSIS — C50919 Malignant neoplasm of unspecified site of unspecified female breast: Secondary | ICD-10-CM | POA: Diagnosis not present

## 2018-12-27 DIAGNOSIS — I10 Essential (primary) hypertension: Secondary | ICD-10-CM | POA: Diagnosis not present

## 2018-12-28 ENCOUNTER — Other Ambulatory Visit: Payer: Self-pay

## 2018-12-28 ENCOUNTER — Encounter (HOSPITAL_COMMUNITY): Payer: Self-pay | Admitting: Hematology

## 2018-12-28 ENCOUNTER — Inpatient Hospital Stay (HOSPITAL_COMMUNITY): Payer: Medicare Other

## 2018-12-28 ENCOUNTER — Inpatient Hospital Stay (HOSPITAL_BASED_OUTPATIENT_CLINIC_OR_DEPARTMENT_OTHER): Payer: Medicare Other | Admitting: Hematology

## 2018-12-28 ENCOUNTER — Inpatient Hospital Stay (HOSPITAL_COMMUNITY): Payer: Medicare Other | Attending: Hematology

## 2018-12-28 VITALS — BP 124/54 | HR 51 | Temp 98.1°F | Wt 131.7 lb

## 2018-12-28 VITALS — BP 135/52 | HR 56 | Temp 97.5°F | Resp 16

## 2018-12-28 DIAGNOSIS — R634 Abnormal weight loss: Secondary | ICD-10-CM | POA: Insufficient documentation

## 2018-12-28 DIAGNOSIS — K5903 Drug induced constipation: Secondary | ICD-10-CM

## 2018-12-28 DIAGNOSIS — K1379 Other lesions of oral mucosa: Secondary | ICD-10-CM | POA: Insufficient documentation

## 2018-12-28 DIAGNOSIS — C50912 Malignant neoplasm of unspecified site of left female breast: Secondary | ICD-10-CM

## 2018-12-28 DIAGNOSIS — E7800 Pure hypercholesterolemia, unspecified: Secondary | ICD-10-CM | POA: Diagnosis not present

## 2018-12-28 DIAGNOSIS — Z79899 Other long term (current) drug therapy: Secondary | ICD-10-CM | POA: Diagnosis not present

## 2018-12-28 DIAGNOSIS — Z7982 Long term (current) use of aspirin: Secondary | ICD-10-CM | POA: Diagnosis not present

## 2018-12-28 DIAGNOSIS — Z171 Estrogen receptor negative status [ER-]: Secondary | ICD-10-CM | POA: Insufficient documentation

## 2018-12-28 DIAGNOSIS — Z5112 Encounter for antineoplastic immunotherapy: Secondary | ICD-10-CM | POA: Insufficient documentation

## 2018-12-28 DIAGNOSIS — R530 Neoplastic (malignant) related fatigue: Secondary | ICD-10-CM

## 2018-12-28 DIAGNOSIS — Z8249 Family history of ischemic heart disease and other diseases of the circulatory system: Secondary | ICD-10-CM | POA: Diagnosis not present

## 2018-12-28 DIAGNOSIS — I10 Essential (primary) hypertension: Secondary | ICD-10-CM | POA: Insufficient documentation

## 2018-12-28 DIAGNOSIS — Z170 Estrogen receptor positive status [ER+]: Principal | ICD-10-CM

## 2018-12-28 DIAGNOSIS — C50512 Malignant neoplasm of lower-outer quadrant of left female breast: Secondary | ICD-10-CM

## 2018-12-28 DIAGNOSIS — C7800 Secondary malignant neoplasm of unspecified lung: Secondary | ICD-10-CM | POA: Insufficient documentation

## 2018-12-28 LAB — CBC WITH DIFFERENTIAL/PLATELET
Abs Immature Granulocytes: 0.01 10*3/uL (ref 0.00–0.07)
Basophils Absolute: 0 10*3/uL (ref 0.0–0.1)
Basophils Relative: 1 %
Eosinophils Absolute: 0.3 10*3/uL (ref 0.0–0.5)
Eosinophils Relative: 8 %
HCT: 32.7 % — ABNORMAL LOW (ref 36.0–46.0)
Hemoglobin: 10.3 g/dL — ABNORMAL LOW (ref 12.0–15.0)
Immature Granulocytes: 0 %
Lymphocytes Relative: 21 %
Lymphs Abs: 0.8 10*3/uL (ref 0.7–4.0)
MCH: 30.5 pg (ref 26.0–34.0)
MCHC: 31.5 g/dL (ref 30.0–36.0)
MCV: 96.7 fL (ref 80.0–100.0)
Monocytes Absolute: 0.4 10*3/uL (ref 0.1–1.0)
Monocytes Relative: 10 %
Neutro Abs: 2.4 10*3/uL (ref 1.7–7.7)
Neutrophils Relative %: 60 %
Platelets: 191 10*3/uL (ref 150–400)
RBC: 3.38 MIL/uL — ABNORMAL LOW (ref 3.87–5.11)
RDW: 13.7 % (ref 11.5–15.5)
WBC: 4 10*3/uL (ref 4.0–10.5)
nRBC: 0 % (ref 0.0–0.2)

## 2018-12-28 LAB — COMPREHENSIVE METABOLIC PANEL
ALT: 17 U/L (ref 0–44)
AST: 17 U/L (ref 15–41)
Albumin: 3.6 g/dL (ref 3.5–5.0)
Alkaline Phosphatase: 55 U/L (ref 38–126)
Anion gap: 7 (ref 5–15)
BUN: 19 mg/dL (ref 8–23)
CO2: 25 mmol/L (ref 22–32)
Calcium: 8.8 mg/dL — ABNORMAL LOW (ref 8.9–10.3)
Chloride: 110 mmol/L (ref 98–111)
Creatinine, Ser: 1.17 mg/dL — ABNORMAL HIGH (ref 0.44–1.00)
GFR calc Af Amer: 47 mL/min — ABNORMAL LOW (ref >60)
GFR calc non Af Amer: 41 mL/min — ABNORMAL LOW (ref >60)
Glucose, Bld: 138 mg/dL — ABNORMAL HIGH (ref 70–99)
Potassium: 4 mmol/L (ref 3.5–5.1)
Sodium: 142 mmol/L (ref 135–145)
Total Bilirubin: 0.5 mg/dL (ref 0.3–1.2)
Total Protein: 5.7 g/dL — ABNORMAL LOW (ref 6.5–8.1)

## 2018-12-28 MED ORDER — ACETAMINOPHEN 325 MG PO TABS
650.0000 mg | ORAL_TABLET | Freq: Once | ORAL | Status: AC
  Administered 2018-12-28: 650 mg via ORAL
  Filled 2018-12-28: qty 2

## 2018-12-28 MED ORDER — DIPHENHYDRAMINE HCL 25 MG PO CAPS
25.0000 mg | ORAL_CAPSULE | Freq: Once | ORAL | Status: AC
  Administered 2018-12-28: 10:00:00 25 mg via ORAL
  Filled 2018-12-28: qty 1

## 2018-12-28 MED ORDER — HEPARIN SOD (PORK) LOCK FLUSH 100 UNIT/ML IV SOLN
500.0000 [IU] | Freq: Once | INTRAVENOUS | Status: AC | PRN
  Administered 2018-12-28: 500 [IU]

## 2018-12-28 MED ORDER — SODIUM CHLORIDE 0.9 % IV SOLN
420.0000 mg | Freq: Once | INTRAVENOUS | Status: AC
  Administered 2018-12-28: 420 mg via INTRAVENOUS
  Filled 2018-12-28: qty 14

## 2018-12-28 MED ORDER — SODIUM CHLORIDE 0.9% FLUSH
10.0000 mL | INTRAVENOUS | Status: DC | PRN
  Administered 2018-12-28: 10 mL
  Filled 2018-12-28: qty 10

## 2018-12-28 MED ORDER — TRASTUZUMAB CHEMO 150 MG IV SOLR
6.0000 mg/kg | Freq: Once | INTRAVENOUS | Status: AC
  Administered 2018-12-28: 378 mg via INTRAVENOUS
  Filled 2018-12-28: qty 18

## 2018-12-28 MED ORDER — SODIUM CHLORIDE 0.9 % IV SOLN
Freq: Once | INTRAVENOUS | Status: AC
  Administered 2018-12-28: 10:00:00 via INTRAVENOUS

## 2018-12-28 NOTE — Patient Instructions (Signed)
[facility] at [facility]  Discharge Instructions:   _______________________________________________________________  Thank you for choosing [facility] at [facility] to provide your oncology and hematology care.  To afford each patient quality time with our providers, please arrive at least 15 minutes before your scheduled appointment.  You need to re-schedule your appointment if you arrive 10 or more minutes late.  We strive to give you quality time with our providers, and arriving late affects you and other patients whose appointments are after yours.  Also, if you no show three or more times for appointments you may be dismissed from the clinic.  Again, thank you for choosing [facility] at [facility]. Our hope is that these requests will allow you access to exceptional care and in a timely manner. _______________________________________________________________  If you have questions after your visit, please contact our office at [phone] between the hours of 8:30 a.m. and 5:00 p.m. Voicemails left after 4:30 p.m. will not be returned until the following business day. _______________________________________________________________  For prescription refill requests, have your pharmacy contact our office. _______________________________________________________________  Recommendations made by the consultant and any test results will be sent to your referring physician. _______________________________________________________________ 

## 2018-12-28 NOTE — Progress Notes (Signed)
[facility] [place], [place] [address]   CLINIC:  Medical Oncology/Hematology  PCP:  [name], MD [place] [address] [phone]   REASON FOR VISIT:  Follow-up for recurrecnt metastatic breast cancer, ER-/PR-/HER2+  CURRENT THERAPY:Herceptin and Perjeta every 3 weeks    BRIEF ONCOLOGIC HISTORY:    Invasive ductal carcinoma of left breast ([place])   03/30/2017 Initial Diagnosis    Invasive ductal carcinoma of left breast ([place])    08/09/2018 -  Chemotherapy    The patient had trastuzumab (HERCEPTIN) 500 mg in sodium chloride 0.9 % 250 mL chemo infusion, 525 mg, Intravenous,  Once, 7 of 10 cycles Administration: 500 mg (08/09/2018), 350 mg (09/06/2018), 350 mg (09/27/2018), 378 mg (10/18/2018), 378 mg (11/15/2018), 378 mg (12/06/2018), 378 mg (12/28/2018) pertuzumab (PERJETA) 840 mg in sodium chloride 0.9 % 250 mL chemo infusion, 840 mg (100 % of original dose 840 mg), Intravenous, Once, 3 of 6 cycles Dose modification: 840 mg (original dose 840 mg, Cycle 5), 420 mg (original dose 420 mg, Cycle 6) Administration: 840 mg (11/15/2018), 420 mg (12/06/2018), 420 mg (12/28/2018)  for chemotherapy treatment.     09/18/2018 Genetic Testing    Negative genetic testing for the breast and gyn cancer panel.  The Breast/GYN gene panel offered by [facility] includes sequencing and rearrangement analysis for the following 23 genes:  ATM, BRCA1, BRCA2, BRIP1, CDH1, CHEK2, EPCAM, MLH1, MSH2, MSH6, NBN, NF1, PALB2, PMS2, PTEN, RAD51C, RAD51D, STK11, and TP53.   The report date is 09/18/2018.      CANCER STAGING: Cancer Staging No matching staging information was found for the patient.   INTERVAL HISTORY:  [name] 85 y.o. female returns for routine follow-up and consideration for next cycle of chemotherapy. She is here today alone. She states that she has mouth sores since her last visit. She states that she has noticed a decrease in her appetite, she  was advised to start drinking ensure or boost daily to help with nutrition. Denies any nausea, or vomiting. Denies any new pains. Had not noticed any recent bleeding such as epistaxis, hematuria or hematochezia. Denies recent chest pain on exertion, shortness of breath on minimal exertion, pre-syncopal episodes, or palpitations. Denies any numbness or tingling in hands or feet. Denies any recent fevers, infections, or recent hospitalizations. Patient reports appetite at 50% and energy level at 50%.    REVIEW OF SYSTEMS:  Review of Systems  Constitutional: Positive for fatigue.  HENT:   Positive for mouth sores.      PAST MEDICAL/SURGICAL HISTORY:  Past Medical History:  Diagnosis Date   Cancer [facility])    left breast   History of kidney stones    Hypercholesteremia    Hypertension    Past Surgical History:  Procedure Laterality Date   APPENDECTOMY     brain cyst removed  2011   BREAST BIOPSY Left 03/28/2018   Procedure: BREAST BIOPSY;  Surgeon: [name], MD;  Location: [facility];  Service: General;  Laterality: Left;   CATARACT EXTRACTION W/PHACO Left 11/09/2015   Procedure: CATARACT EXTRACTION PHACO AND INTRAOCULAR LENS PLACEMENT LEFT EYE cde=8.97;  Surgeon: [name], MD;  Location: [facility];  Service: Ophthalmology;  Laterality: Left;   EYE SURGERY     KPE left   MASTECTOMY MODIFIED RADICAL Left 05/28/2018   Procedure: LEFT MODIFIED RADICAL MASTECTOMY;  Surgeon: [name], MD;  Location: [facility];  Service: General;  Laterality: Left;   MASTECTOMY, PARTIAL Left  04/24/2017   Procedure: MASTECTOMY PARTIAL;  Surgeon: [name], MD;  Location: [facility];  Service: General;  Laterality: Left;   PORTACATH PLACEMENT Right 08/08/2018   Procedure: INSERTION PORT-A-CATH;  Surgeon: [name], MD;  Location: [facility];  Service: General;  Laterality: Right;   TONSILLECTOMY     TONSILLECTOMY AND ADENOIDECTOMY       SOCIAL HISTORY:  Social History   Socioeconomic History     Marital status: Widowed    Spouse name: Not on file   Number of children: Not on file   Years of education: Not on file   Highest education level: Not on file  Occupational History   Not on file  Social Needs   Financial resource strain: Not hard at all   Food insecurity:    Worry: Never true    Inability: Never true   Transportation needs:    Medical: No    Non-medical: No  Tobacco Use   Smoking status: Never Smoker   Smokeless tobacco: Never Used  Substance and Sexual Activity   Alcohol use: No   Drug use: No   Sexual activity: Not Currently    Partners: Male  Lifestyle   Physical activity:    Days per week: Patient refused    Minutes per session: Patient refused   Stress: Not at all  Relationships   Social connections:    Talks on phone: Patient refused    Gets together: Patient refused    Attends religious service: Patient refused    Active member of club or organization: Patient refused    Attends meetings of clubs or organizations: Patient refused    Relationship status: Patient refused   Intimate partner violence:    Fear of current or ex partner: Patient refused    Emotionally abused: Patient refused    Physically abused: Patient refused    Forced sexual activity: Patient refused  Other Topics Concern   Not on file  Social History Narrative   Not on file    FAMILY HISTORY:  Family History  Problem Relation Age of Onset   Heart failure Mother    Heart failure Father    Congestive Heart Failure Brother    Tuberculosis Paternal Uncle    Tuberculosis Maternal Grandmother    Tuberculosis Maternal Grandfather    Congestive Heart Failure Brother     CURRENT MEDICATIONS:  Outpatient Encounter Medications as of 12/28/2018  Medication Sig   amLODipine (NORVASC) 5 MG tablet Take 5 mg by mouth daily.   aspirin EC 81 MG tablet Take 1 tablet (81 mg total) by mouth daily.   ferrous sulfate 325 (65 FE) MG tablet Take 325 mg by  mouth daily with breakfast.   labetalol (NORMODYNE) 100 MG tablet Take 1 tablet (100 mg total) by mouth 2 (two) times daily.   LORazepam (ATIVAN) 1 MG tablet TAKE ONE TABLET BY MOUTH AS A ONE TIME DOSE 45 MINUTES PRIOR TO YOUR SCAN   naproxen sodium (ALEVE) 220 MG tablet Take 220 mg by mouth daily as needed (for headache or pain).   oxybutynin (DITROPAN) 5 MG tablet Take 5 mg by mouth daily.    prochlorperazine (COMPAZINE) 10 MG tablet Take 1 tablet (10 mg total) by mouth every 6 (six) hours as needed (Nausea or vomiting).   simvastatin (ZOCOR) 40 MG tablet Take 40 mg by mouth daily.   Trastuzumab (HERCEPTIN IV) Inject 525 mg into the vein every 21 ( twenty-one) days.   No facility-administered encounter medications on  file as of 12/28/2018.     ALLERGIES:  No Known Allergies   PHYSICAL EXAM:  ECOG Performance status: 1  Vitals:   12/28/18 0847  BP: (!) 124/54  Pulse: (!) 51  Temp: 98.1 F (36.7 C)  SpO2: 96%   Filed Weights   12/28/18 0847  Weight: 131 lb 11.2 oz (59.7 kg)    Physical Exam Vitals signs reviewed.  Constitutional:      Appearance: Normal appearance.  Cardiovascular:     Rate and Rhythm: Normal rate and regular rhythm.     Heart sounds: Normal heart sounds.  Pulmonary:     Breath sounds: Normal breath sounds.  Abdominal:     General: There is no distension.     Palpations: Abdomen is soft. There is no mass.  Musculoskeletal:        General: No swelling.  Skin:    General: Skin is warm.  Neurological:     General: No focal deficit present.     Mental Status: She is alert and oriented to person, place, and time.  Psychiatric:        Mood and Affect: Mood normal.        Behavior: Behavior normal.      LABORATORY DATA:  I have reviewed the labs as listed.  CBC    Component Value Date/Time   WBC 4.0 12/28/2018 0840   RBC 3.38 (L) 12/28/2018 0840   HGB 10.3 (L) 12/28/2018 0840   HCT 32.7 (L) 12/28/2018 0840   PLT 191 12/28/2018 0840    MCV 96.7 12/28/2018 0840   MCH 30.5 12/28/2018 0840   MCHC 31.5 12/28/2018 0840   RDW 13.7 12/28/2018 0840   LYMPHSABS 0.8 12/28/2018 0840   MONOABS 0.4 12/28/2018 0840   EOSABS 0.3 12/28/2018 0840   BASOSABS 0.0 12/28/2018 0840   CMP Latest Ref Rng & Units 12/28/2018 12/06/2018 11/15/2018  Glucose 70 - 99 mg/dL 138(H) 131(H) 136(H)  BUN 8 - 23 mg/dL 19 27(H) 29(H)  Creatinine 0.44 - 1.00 mg/dL 1.17(H) 1.11(H) 1.25(H)  Sodium 135 - 145 mmol/L 142 140 142  Potassium 3.5 - 5.1 mmol/L 4.0 4.3 4.5  Chloride 98 - 111 mmol/L 110 108 110  CO2 22 - 32 mmol/L _0 Calcium 8.9 - 10.3 mg/dL 8.8(L) 8.9 9.0  Total Protein 6.5 - 8.1 g/dL 5.7(L) 5.8(L) 5.8(L)  Total Bilirubin 0.3 - 1.2 mg/dL 0.5 0.4 0.5  Alkaline Phos 38 - 126 U/L 55 51 48  AST 15 - 41 U/L _1 ALT 0 - 44 U/L _2 DIAGNOSTIC IMAGING:  I have independently reviewed the scans and discussed with the patient.   I have reviewed Venita Lick LPN's note and agree with the documentation.  I personally performed a face-to-face visit, made revisions and my assessment and plan is as follows.    ASSESSMENT & PLAN:   Invasive ductal carcinoma of left breast ([place]) 1.  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  No lymph node sampling was done. - In July 2019, she noticed a lump at her prior lumpectomy site, which was painful.  She did not want to have mammogram done. -Biopsy of this  lump on 03/28/2018 by Dr. Arnoldo Morale showed invasive ductal carcinoma with calcifications (2.2 cm), ER negative, PR negative, Ki-67 40%, HER-2 3+ positive by IHC.  3/3 lymph nodes positive. - Left modified radical mastectomy on 05/28/2018.  -PET/CT  scan on 07/23/2018 shows few scattered mildly hypermetabolic pulmonary nodules, hypermetabolic left axillary, right paratracheal, right hilar, subcarinal adenopathy compatible with metastatic disease in the chest.  -2D echo on 08/03/2018 shows EF of 65 to 70%.   - 4 cycles of single agent Herceptin every 3 weeks from 08/09/2018 through 10/18/2018. -She is continuing to maintain her active lifestyle and lives independently at home. -She has slight diarrhea and takes Imodium up to 5 tablets/day.  She is taking 3 tablets in the morning and 2 tablets in the afternoon. - PET CT scan on 10/30/2018 showed mild progression of pulmonary metastasis.  Stable mediastinal lymph nodes.  2 adjacent hypermetabolic left axillary lymph nodes, previously only 1 lymph node noted.  No other evidence of metastasis in the abdomen or pelvis or skeleton. -Perjeta and Herceptin started on 11/15/2018. -Second cycle on 12/06/2018.  She tolerated very well.  No worsening of diarrhea noted.  No symptoms of PND or orthopnea. -Physical exam today was within normal limits. -I will arrange for a 2D echocardiogram prior to next visit in 3 weeks.  I reviewed her blood work.  She may proceed with her treatment today. -She lost few pounds from last visit.  She was encouraged to eat well.  If she continues to lose weight, we will consider appetite stimulants.   2.  Diarrhea: -She reports having diarrhea every fourth day.  She reports that it as explosive. -She is taking Imodium as needed.    Total time spent is 25 minutes with more than 50% of the time spent face-to-face discussing and managing treatment related side effects and coordination of care.    Orders placed this encounter:  Orders Placed This Encounter  Procedures   ECHOCARDIOGRAM COMPLETE      Derek Jack, MD [place] [phone]

## 2018-12-28 NOTE — Assessment & Plan Note (Signed)
1.  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  No lymph node sampling was done. - In July 2019, she noticed a lump at her prior lumpectomy site, which was painful.  She did not want to have mammogram done. -Biopsy of this lump on 03/28/2018 by Dr. Arnoldo Morale showed invasive ductal carcinoma with calcifications (2.2 cm), ER negative, PR negative, Ki-67 40%, HER-2 3+ positive by IHC.  3/3 lymph nodes positive. - Left modified radical mastectomy on 05/28/2018.  -PET/CT scan on 07/23/2018 shows few scattered mildly hypermetabolic pulmonary nodules, hypermetabolic left axillary, right paratracheal, right hilar, subcarinal adenopathy compatible with metastatic disease in the chest.  -2D echo on 08/03/2018 shows EF of 65 to 70%.   - 4 cycles of single agent Herceptin every 3 weeks from 08/09/2018 through 10/18/2018. -She is continuing to maintain her active lifestyle and lives independently at home. -She has slight diarrhea and takes Imodium up to 5 tablets/day.  She is taking 3 tablets in the morning and 2 tablets in the afternoon. - PET CT scan on 10/30/2018 showed mild progression of pulmonary metastasis.  Stable mediastinal lymph nodes.  2 adjacent hypermetabolic left axillary lymph nodes, previously only 1 lymph node noted.  No other evidence of metastasis in the abdomen or pelvis or skeleton. -Perjeta and Herceptin started on 11/15/2018. -Second cycle on 12/06/2018.  She tolerated very well.  No worsening of diarrhea noted.  No symptoms of PND or orthopnea. -Physical exam today was within normal limits. -I will arrange for a 2D echocardiogram prior to next visit in 3 weeks.  I reviewed her blood work.  She may proceed  with her treatment today. -She lost few pounds from last visit.  She was encouraged to eat well.  If she continues to lose weight, we will consider appetite stimulants.   2.  Diarrhea: -She reports having diarrhea every fourth day.  She reports that it as explosive. -She is taking Imodium as needed.

## 2018-12-28 NOTE — Patient Instructions (Addendum)
[facility] at [facility] Discharge Instructions  You were seen today by Dr. Katragadda. He went over your recent lab results. He will see you back in 3 weeks for labs and follow up.   Thank you for choosing [facility] at [facility] to provide your oncology and hematology care.  To afford each patient quality time with our provider, please arrive at least 15 minutes before your scheduled appointment time.   If you have a lab appointment with the Cancer Center please come in thru the  Main Entrance and check in at the main information desk  You need to re-schedule your appointment should you arrive 10 or more minutes late.  We strive to give you quality time with our providers, and arriving late affects you and other patients whose appointments are after yours.  Also, if you no show three or more times for appointments you may be dismissed from the clinic at the providers discretion.     Again, thank you for choosing [facility].  Our hope is that these requests will decrease the amount of time that you wait before being seen by our physicians.       _____________________________________________________________  Should you have questions after your visit to [facility], please contact our office at [phone] between the hours of 8:00 a.m. and 4:30 p.m.  Voicemails left after 4:00 p.m. will not be returned until the following business day.  For prescription refill requests, have your pharmacy contact our office and allow 72 hours.    Cancer Center Support Programs:   > Cancer Support Group  2nd Tuesday of the month 1pm-2pm, Journey Room    

## 2018-12-28 NOTE — Progress Notes (Signed)
Oncology follow up with lab review and ok to treat verbal order Dr. Delton Coombes.   Patient tolerated chemotherapy with no complaints voiced.  Port site clean and dry with no bruising or swelling noted at site.  Good blood return noted before and after administration of chemotherapy.  Band aid applied.  Patient left ambulatory with VSS and no s/s of distress noted.

## 2019-01-02 ENCOUNTER — Ambulatory Visit (HOSPITAL_COMMUNITY)
Admission: RE | Admit: 2019-01-02 | Discharge: 2019-01-02 | Disposition: A | Discharge status: Home / Self Care | Payer: Medicare Other | Source: Ambulatory Visit | Attending: Hematology | Admitting: Hematology

## 2019-01-02 ENCOUNTER — Other Ambulatory Visit: Payer: Self-pay

## 2019-01-02 DIAGNOSIS — C50912 Malignant neoplasm of unspecified site of left female breast: Secondary | ICD-10-CM

## 2019-01-02 DIAGNOSIS — C50512 Malignant neoplasm of lower-outer quadrant of left female breast: Secondary | ICD-10-CM | POA: Diagnosis not present

## 2019-01-02 DIAGNOSIS — E785 Hyperlipidemia, unspecified: Secondary | ICD-10-CM | POA: Insufficient documentation

## 2019-01-02 DIAGNOSIS — Z9012 Acquired absence of left breast and nipple: Secondary | ICD-10-CM | POA: Insufficient documentation

## 2019-01-02 DIAGNOSIS — I119 Hypertensive heart disease without heart failure: Secondary | ICD-10-CM | POA: Diagnosis not present

## 2019-01-02 NOTE — Progress Notes (Signed)
*  PRELIMINARY RESULTS* Echocardiogram 2D Echocardiogram has been performed.  Carol Duarte 01/02/2019, 9:36 AM

## 2019-01-18 ENCOUNTER — Other Ambulatory Visit: Payer: Self-pay

## 2019-01-18 ENCOUNTER — Inpatient Hospital Stay (HOSPITAL_COMMUNITY): Payer: Medicare Other

## 2019-01-18 ENCOUNTER — Encounter (HOSPITAL_COMMUNITY): Payer: Self-pay

## 2019-01-18 ENCOUNTER — Inpatient Hospital Stay (HOSPITAL_BASED_OUTPATIENT_CLINIC_OR_DEPARTMENT_OTHER): Payer: Medicare Other | Admitting: Hematology

## 2019-01-18 ENCOUNTER — Encounter (HOSPITAL_COMMUNITY): Payer: Self-pay | Admitting: Hematology

## 2019-01-18 VITALS — BP 130/51 | HR 58 | Temp 98.1°F | Resp 18 | Wt 132.4 lb

## 2019-01-18 VITALS — BP 114/50 | HR 66 | Temp 98.0°F | Resp 18

## 2019-01-18 DIAGNOSIS — I10 Essential (primary) hypertension: Secondary | ICD-10-CM | POA: Diagnosis not present

## 2019-01-18 DIAGNOSIS — R634 Abnormal weight loss: Secondary | ICD-10-CM

## 2019-01-18 DIAGNOSIS — C50912 Malignant neoplasm of unspecified site of left female breast: Secondary | ICD-10-CM

## 2019-01-18 DIAGNOSIS — K1379 Other lesions of oral mucosa: Secondary | ICD-10-CM

## 2019-01-18 DIAGNOSIS — Z8249 Family history of ischemic heart disease and other diseases of the circulatory system: Secondary | ICD-10-CM

## 2019-01-18 DIAGNOSIS — Z171 Estrogen receptor negative status [ER-]: Secondary | ICD-10-CM

## 2019-01-18 DIAGNOSIS — E7800 Pure hypercholesterolemia, unspecified: Secondary | ICD-10-CM | POA: Diagnosis not present

## 2019-01-18 DIAGNOSIS — C7800 Secondary malignant neoplasm of unspecified lung: Secondary | ICD-10-CM

## 2019-01-18 DIAGNOSIS — Z79899 Other long term (current) drug therapy: Secondary | ICD-10-CM

## 2019-01-18 DIAGNOSIS — K5903 Drug induced constipation: Secondary | ICD-10-CM | POA: Diagnosis not present

## 2019-01-18 DIAGNOSIS — R530 Neoplastic (malignant) related fatigue: Secondary | ICD-10-CM | POA: Diagnosis not present

## 2019-01-18 DIAGNOSIS — Z7982 Long term (current) use of aspirin: Secondary | ICD-10-CM | POA: Diagnosis not present

## 2019-01-18 DIAGNOSIS — Z5112 Encounter for antineoplastic immunotherapy: Secondary | ICD-10-CM | POA: Diagnosis not present

## 2019-01-18 DIAGNOSIS — C50512 Malignant neoplasm of lower-outer quadrant of left female breast: Secondary | ICD-10-CM

## 2019-01-18 LAB — CBC WITH DIFFERENTIAL/PLATELET
Abs Immature Granulocytes: 0.01 10*3/uL (ref 0.00–0.07)
Basophils Absolute: 0 10*3/uL (ref 0.0–0.1)
Basophils Relative: 1 %
Eosinophils Absolute: 0.2 10*3/uL (ref 0.0–0.5)
Eosinophils Relative: 6 %
HCT: 31.7 % — ABNORMAL LOW (ref 36.0–46.0)
Hemoglobin: 10.1 g/dL — ABNORMAL LOW (ref 12.0–15.0)
Immature Granulocytes: 0 %
Lymphocytes Relative: 21 %
Lymphs Abs: 0.8 10*3/uL (ref 0.7–4.0)
MCH: 30.6 pg (ref 26.0–34.0)
MCHC: 31.9 g/dL (ref 30.0–36.0)
MCV: 96.1 fL (ref 80.0–100.0)
Monocytes Absolute: 0.4 10*3/uL (ref 0.1–1.0)
Monocytes Relative: 10 %
Neutro Abs: 2.4 10*3/uL (ref 1.7–7.7)
Neutrophils Relative %: 62 %
Platelets: 186 10*3/uL (ref 150–400)
RBC: 3.3 MIL/uL — ABNORMAL LOW (ref 3.87–5.11)
RDW: 13.8 % (ref 11.5–15.5)
WBC: 3.9 10*3/uL — ABNORMAL LOW (ref 4.0–10.5)
nRBC: 0 % (ref 0.0–0.2)

## 2019-01-18 LAB — COMPREHENSIVE METABOLIC PANEL
ALT: 15 U/L (ref 0–44)
AST: 17 U/L (ref 15–41)
Albumin: 3.5 g/dL (ref 3.5–5.0)
Alkaline Phosphatase: 53 U/L (ref 38–126)
Anion gap: 9 (ref 5–15)
BUN: 27 mg/dL — ABNORMAL HIGH (ref 8–23)
CO2: 22 mmol/L (ref 22–32)
Calcium: 8.7 mg/dL — ABNORMAL LOW (ref 8.9–10.3)
Chloride: 108 mmol/L (ref 98–111)
Creatinine, Ser: 1.21 mg/dL — ABNORMAL HIGH (ref 0.44–1.00)
GFR calc Af Amer: 45 mL/min — ABNORMAL LOW (ref >60)
GFR calc non Af Amer: 39 mL/min — ABNORMAL LOW (ref >60)
Glucose, Bld: 115 mg/dL — ABNORMAL HIGH (ref 70–99)
Potassium: 4.1 mmol/L (ref 3.5–5.1)
Sodium: 139 mmol/L (ref 135–145)
Total Bilirubin: 0.5 mg/dL (ref 0.3–1.2)
Total Protein: 5.6 g/dL — ABNORMAL LOW (ref 6.5–8.1)

## 2019-01-18 MED ORDER — DIPHENHYDRAMINE HCL 25 MG PO CAPS
25.0000 mg | ORAL_CAPSULE | Freq: Once | ORAL | Status: AC
  Administered 2019-01-18: 25 mg via ORAL

## 2019-01-18 MED ORDER — DIPHENHYDRAMINE HCL 25 MG PO CAPS
ORAL_CAPSULE | ORAL | Status: AC
  Filled 2019-01-18: qty 1

## 2019-01-18 MED ORDER — ACETAMINOPHEN 325 MG PO TABS
ORAL_TABLET | ORAL | Status: AC
  Filled 2019-01-18: qty 2

## 2019-01-18 MED ORDER — SODIUM CHLORIDE 0.9% FLUSH
10.0000 mL | INTRAVENOUS | Status: DC | PRN
  Administered 2019-01-18: 10 mL
  Filled 2019-01-18: qty 10

## 2019-01-18 MED ORDER — SODIUM CHLORIDE 0.9 % IV SOLN
420.0000 mg | Freq: Once | INTRAVENOUS | Status: AC
  Administered 2019-01-18: 420 mg via INTRAVENOUS
  Filled 2019-01-18: qty 14

## 2019-01-18 MED ORDER — HEPARIN SOD (PORK) LOCK FLUSH 100 UNIT/ML IV SOLN
500.0000 [IU] | Freq: Once | INTRAVENOUS | Status: AC | PRN
  Administered 2019-01-18: 500 [IU]

## 2019-01-18 MED ORDER — TRASTUZUMAB CHEMO 150 MG IV SOLR
6.0000 mg/kg | Freq: Once | INTRAVENOUS | Status: AC
  Administered 2019-01-18: 378 mg via INTRAVENOUS
  Filled 2019-01-18: qty 18

## 2019-01-18 MED ORDER — SODIUM CHLORIDE 0.9 % IV SOLN
Freq: Once | INTRAVENOUS | Status: AC
  Administered 2019-01-18: 10:00:00 via INTRAVENOUS

## 2019-01-18 MED ORDER — ACETAMINOPHEN 325 MG PO TABS
650.0000 mg | ORAL_TABLET | Freq: Once | ORAL | Status: AC
  Administered 2019-01-18: 650 mg via ORAL

## 2019-01-18 NOTE — Progress Notes (Signed)
Pt presents today for treatment and doctor appointment. VSS. Pt has no complaints of any changes since the last visit. MAR reviewed and updated.   Message received to proceed with treatment per Dr. Delton Coombes.   Treatment given today per MD orders. Tolerated infusion without adverse affects. Vital signs stable. No complaints at this time. Discharged from clinic ambulatory. F/U with [facility] as scheduled.

## 2019-01-18 NOTE — Patient Instructions (Signed)
[facility] Discharge Instructions for Patients Receiving Chemotherapy  Today you received the following chemotherapy agents   To help prevent nausea and vomiting after your treatment, we encourage you to take your nausea medication   If you develop nausea and vomiting that is not controlled by your nausea medication, call the clinic.   BELOW ARE SYMPTOMS THAT SHOULD BE REPORTED IMMEDIATELY:  *FEVER GREATER THAN 100.5 F  *CHILLS WITH OR WITHOUT FEVER  NAUSEA AND VOMITING THAT IS NOT CONTROLLED WITH YOUR NAUSEA MEDICATION  *UNUSUAL SHORTNESS OF BREATH  *UNUSUAL BRUISING OR BLEEDING  TENDERNESS IN MOUTH AND THROAT WITH OR WITHOUT PRESENCE OF ULCERS  *URINARY PROBLEMS  *BOWEL PROBLEMS  UNUSUAL RASH Items with * indicate a potential emergency and should be followed up as soon as possible.  Feel free to call the clinic should you have any questions or concerns. The clinic phone number is [phone].  Please show the CHEMO ALERT CARD at check-in to the Emergency Department and triage nurse.   

## 2019-01-18 NOTE — Assessment & Plan Note (Addendum)
1.  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  No lymph node sampling was done. - In July 2019, she noticed a lump at her prior lumpectomy site, which was painful.  She did not want to have mammogram done. -Biopsy of this lump on 03/28/2018 by Dr. Arnoldo Morale showed invasive ductal carcinoma with calcifications (2.2 cm), ER negative, PR negative, Ki-67 40%, HER-2 3+ positive by IHC.  3/3 lymph nodes positive. - Left modified radical mastectomy on 05/28/2018.  -PET/CT scan on 07/23/2018 shows few scattered mildly hypermetabolic pulmonary nodules, hypermetabolic left axillary, right paratracheal, right hilar, subcarinal adenopathy compatible with metastatic disease in the chest.  -2D echo on 08/03/2018 shows EF of 65 to 70%.   - 4 cycles of single agent Herceptin every 3 weeks from 08/09/2018 through 10/18/2018. - PET CT scan on 10/30/2018 showed mild progression of pulmonary metastasis.  Stable mediastinal lymph nodes.  2 adjacent hypermetabolic left axillary lymph nodes, previously only 1 lymph node noted.  No other evidence of metastasis in the abdomen or pelvis or skeleton. -Perjeta and Herceptin started on 11/15/2018.  Cycle 2 on 12/06/2018.  -Echocardiogram on 01/02/2019 shows EF of 60 to 65%. - She is tolerating Herceptin and Perjeta very well.  Denies any diarrhea.  She is very active at home.  She may proceed with her next cycle today. - RTC in 3 weeks.  I will plan to repeat scans after next cycle.  2.  Diarrhea: -She reports diarrhea every fourth day.  She is controlling it with Imodium.

## 2019-01-18 NOTE — Progress Notes (Signed)
[address] [place], [place] [address]   CLINIC:  Medical Oncology/Hematology  PCP:  [name], MD [place] [id number] [phone]   REASON FOR VISIT:  Follow-up for Breast Cancer  CURRENT THERAPY: Trastuzumab/Pertuzumab  BRIEF ONCOLOGIC HISTORY:    Invasive ductal carcinoma of left breast ([facility])   03/30/2017 Initial Diagnosis    Invasive ductal carcinoma of left breast ([facility])    08/09/2018 -  Chemotherapy    The patient had trastuzumab (HERCEPTIN) 500 mg in sodium chloride 0.9 % 250 mL chemo infusion, 525 mg, Intravenous,  Once, 8 of 10 cycles Administration: 500 mg (08/09/2018), 350 mg (09/06/2018), 350 mg (09/27/2018), 378 mg (10/18/2018), 378 mg (11/15/2018), 378 mg (12/06/2018), 378 mg (12/28/2018), 378 mg (01/18/2019) pertuzumab (PERJETA) 840 mg in sodium chloride 0.9 % 250 mL chemo infusion, 840 mg (100 % of original dose 840 mg), Intravenous, Once, 4 of 6 cycles Dose modification: 840 mg (original dose 840 mg, Cycle 5), 420 mg (original dose 420 mg, Cycle 6) Administration: 840 mg (11/15/2018), 420 mg (12/06/2018), 420 mg (12/28/2018), 420 mg (01/18/2019)  for chemotherapy treatment.     09/18/2018 Genetic Testing    Negative genetic testing for the breast and gyn cancer panel.  The Breast/GYN gene panel offered by [facility] includes sequencing and rearrangement analysis for the following 23 genes:  ATM, BRCA1, BRCA2, BRIP1, CDH1, CHEK2, EPCAM, MLH1, MSH2, MSH6, NBN, NF1, PALB2, PMS2, PTEN, RAD51C, RAD51D, STK11, and TP53.   The report date is 09/18/2018.      INTERVAL HISTORY:  [name] 85 y.o. female  Presents today for follow up. She reports overall doing well. She denies any significant fatigue. She is currently receiving Trastuzumab and Pertuzumab, tolerating well. Appetite is stable. She is participating in adequate physical activity daily. Denies any nausea, vomiting, diarrhea. Denies any fevers, chills, night sweats. No weight  loss. States she is ready to proceed with treatment today.     REVIEW OF SYSTEMS:  Review of Systems  Constitutional: Negative.   HENT:  Negative.   Eyes: Negative.   Respiratory: Negative.   Cardiovascular: Negative.   Gastrointestinal: Negative.   Endocrine: Negative.   Genitourinary: Negative.    Musculoskeletal: Negative.   Skin: Negative.   Neurological: Negative.   Hematological: Negative.   Psychiatric/Behavioral: Negative.      PAST MEDICAL/SURGICAL HISTORY:  Past Medical History:  Diagnosis Date   Cancer [facility])    left breast   History of kidney stones    Hypercholesteremia    Hypertension    Past Surgical History:  Procedure Laterality Date   APPENDECTOMY     brain cyst removed  2011   BREAST BIOPSY Left 03/28/2018   Procedure: BREAST BIOPSY;  Surgeon: [name], MD;  Location: [facility];  Service: General;  Laterality: Left;   CATARACT EXTRACTION W/PHACO Left 11/09/2015   Procedure: CATARACT EXTRACTION PHACO AND INTRAOCULAR LENS PLACEMENT LEFT EYE cde=8.97;  Surgeon: [name], MD;  Location: [facility];  Service: Ophthalmology;  Laterality: Left;   EYE SURGERY     KPE left   MASTECTOMY MODIFIED RADICAL Left 05/28/2018   Procedure: LEFT MODIFIED RADICAL MASTECTOMY;  Surgeon: [name], MD;  Location: [facility];  Service: General;  Laterality: Left;   MASTECTOMY, PARTIAL Left 04/24/2017   Procedure: MASTECTOMY PARTIAL;  Surgeon: [name], MD;  Location: [facility];  Service: General;  Laterality: Left;   PORTACATH PLACEMENT Right 08/08/2018   Procedure: INSERTION PORT-A-CATH;  Surgeon:  [name], MD;  Location: [facility];  Service: General;  Laterality: Right;   TONSILLECTOMY     TONSILLECTOMY AND ADENOIDECTOMY       SOCIAL HISTORY:  Social History   Socioeconomic History   Marital status: Widowed    Spouse name: Not on file   Number of children: Not on file   Years of education: Not on file   Highest education level: Not on file    Occupational History   Not on file  Social Needs   Financial resource strain: Not hard at all   Food insecurity:    Worry: Never true    Inability: Never true   Transportation needs:    Medical: No    Non-medical: No  Tobacco Use   Smoking status: Never Smoker   Smokeless tobacco: Never Used  Substance and Sexual Activity   Alcohol use: No   Drug use: No   Sexual activity: Not Currently    Partners: Male  Lifestyle   Physical activity:    Days per week: Patient refused    Minutes per session: Patient refused   Stress: Not at all  Relationships   Social connections:    Talks on phone: Patient refused    Gets together: Patient refused    Attends religious service: Patient refused    Active member of club or organization: Patient refused    Attends meetings of clubs or organizations: Patient refused    Relationship status: Patient refused   Intimate partner violence:    Fear of current or ex partner: Patient refused    Emotionally abused: Patient refused    Physically abused: Patient refused    Forced sexual activity: Patient refused  Other Topics Concern   Not on file  Social History Narrative   Not on file    FAMILY HISTORY:  Family History  Problem Relation Age of Onset   Heart failure Mother    Heart failure Father    Congestive Heart Failure Brother    Tuberculosis Paternal Uncle    Tuberculosis Maternal Grandmother    Tuberculosis Maternal Grandfather    Congestive Heart Failure Brother     CURRENT MEDICATIONS:  Outpatient Encounter Medications as of 01/18/2019  Medication Sig   amLODipine (NORVASC) 5 MG tablet Take 5 mg by mouth daily.   aspirin EC 81 MG tablet Take 1 tablet (81 mg total) by mouth daily.   ferrous sulfate 325 (65 FE) MG tablet Take 325 mg by mouth daily with breakfast.   labetalol (NORMODYNE) 100 MG tablet Take 1 tablet (100 mg total) by mouth 2 (two) times daily.   LORazepam (ATIVAN) 1 MG tablet TAKE ONE  TABLET BY MOUTH AS A ONE TIME DOSE 45 MINUTES PRIOR TO YOUR SCAN   naproxen sodium (ALEVE) 220 MG tablet Take 220 mg by mouth daily as needed (for headache or pain).   oxybutynin (DITROPAN) 5 MG tablet Take 5 mg by mouth daily.    prochlorperazine (COMPAZINE) 10 MG tablet Take 1 tablet (10 mg total) by mouth every 6 (six) hours as needed (Nausea or vomiting).   simvastatin (ZOCOR) 40 MG tablet Take 40 mg by mouth daily.   Trastuzumab (HERCEPTIN IV) Inject 525 mg into the vein every 21 ( twenty-one) days.   [EXPIRED] 0.9 %  sodium chloride infusion    [EXPIRED] acetaminophen (TYLENOL) tablet 650 mg    [EXPIRED] diphenhydrAMINE (BENADRYL) capsule 25 mg    [EXPIRED] heparin lock flush 100 unit/mL    [EXPIRED] pertuzumab (  PERJETA) 420 mg in sodium chloride 0.9 % 250 mL chemo infusion    [EXPIRED] trastuzumab (HERCEPTIN) 378 mg in sodium chloride 0.9 % 250 mL chemo infusion    [DISCONTINUED] sodium chloride flush (NS) 0.9 % injection 10 mL    No facility-administered encounter medications on file as of 01/18/2019.     ALLERGIES:  No Known Allergies   PHYSICAL EXAM:  ECOG Performance status: 1  Vitals:   01/18/19 0907  BP: (!) 130/51  Pulse: (!) 58  Resp: 18  Temp: 98.1 F (36.7 C)  SpO2: 98%   Filed Weights   01/18/19 0907  Weight: 132 lb 6.4 oz (60.1 kg)    Physical Exam Vitals signs reviewed.  Constitutional:      Appearance: Normal appearance.  Cardiovascular:     Rate and Rhythm: Normal rate and regular rhythm.     Heart sounds: Normal heart sounds.  Pulmonary:     Effort: Pulmonary effort is normal.     Breath sounds: Normal breath sounds.  Abdominal:     General: There is no distension.     Palpations: Abdomen is soft. There is no mass.  Skin:    General: Skin is warm.  Neurological:     General: No focal deficit present.     Mental Status: She is alert and oriented to person, place, and time.  Psychiatric:        Mood and Affect: Mood normal.          Behavior: Behavior normal.      LABORATORY DATA:  I have reviewed the labs as listed.  CBC    Component Value Date/Time   WBC 3.9 (L) 01/18/2019 0851   RBC 3.30 (L) 01/18/2019 0851   HGB 10.1 (L) 01/18/2019 0851   HCT 31.7 (L) 01/18/2019 0851   PLT 186 01/18/2019 0851   MCV 96.1 01/18/2019 0851   MCH 30.6 01/18/2019 0851   MCHC 31.9 01/18/2019 0851   RDW 13.8 01/18/2019 0851   LYMPHSABS 0.8 01/18/2019 0851   MONOABS 0.4 01/18/2019 0851   EOSABS 0.2 01/18/2019 0851   BASOSABS 0.0 01/18/2019 0851   CMP Latest Ref Rng & Units 01/18/2019 12/28/2018 12/06/2018  Glucose 70 - 99 mg/dL 115(H) 138(H) 131(H)  BUN 8 - 23 mg/dL 27(H) 19 27(H)  Creatinine 0.44 - 1.00 mg/dL 1.21(H) 1.17(H) 1.11(H)  Sodium 135 - 145 mmol/L 139 142 140  Potassium 3.5 - 5.1 mmol/L 4.1 4.0 4.3  Chloride 98 - 111 mmol/L 108 110 108  CO2 22 - 32 mmol/L _0 Calcium 8.9 - 10.3 mg/dL 8.7(L) 8.8(L) 8.9  Total Protein 6.5 - 8.1 g/dL 5.6(L) 5.7(L) 5.8(L)  Total Bilirubin 0.3 - 1.2 mg/dL 0.5 0.5 0.4  Alkaline Phos 38 - 126 U/L 53 55 51  AST 15 - 41 U/L _1 ALT 0 - 44 U/L _2 DIAGNOSTIC IMAGING:  I have independently reviewed the scans and discussed with the patient.   I have reviewed Carver Fila, NP's note and agree with the documentation.  I personally performed a face-to-face visit, made revisions and my assessment and plan is as follows.    ASSESSMENT & PLAN:   Invasive ductal carcinoma of left breast ([place]) 1.  Metastatic HER-2 positive left breast cancer: - Initial biopsy of the left breast lesion on 03/14/2017, ER 20% positive, PR negative, HER-2 negative by FISH, Ki-67 20%, status post lumpectomy on 04/24/2017 with pathology showing 4.2  cm invasive ductal carcinoma, grade 3, invasive carcinoma focally 0.1 cm from the posterior and anterior margins.  No lymph node sampling was done. - In July 2019, she noticed a lump at her prior lumpectomy site, which was painful.  She did not  want to have mammogram done. -Biopsy of this lump on 03/28/2018 by Dr. Arnoldo Morale showed invasive ductal carcinoma with calcifications (2.2 cm), ER negative, PR negative, Ki-67 40%, HER-2 3+ positive by IHC.  3/3 lymph nodes positive. - Left modified radical mastectomy on 05/28/2018.  -PET/CT scan on 07/23/2018 shows few scattered mildly hypermetabolic pulmonary nodules, hypermetabolic left axillary, right paratracheal, right hilar, subcarinal adenopathy compatible with metastatic disease in the chest.  -2D echo on 08/03/2018 shows EF of 65 to 70%.   - 4 cycles of single agent Herceptin every 3 weeks from 08/09/2018 through 10/18/2018. - PET CT scan on 10/30/2018 showed mild progression of pulmonary metastasis.  Stable mediastinal lymph nodes.  2 adjacent hypermetabolic left axillary lymph nodes, previously only 1 lymph node noted.  No other evidence of metastasis in the abdomen or pelvis or skeleton. -Perjeta and Herceptin started on 11/15/2018.  Cycle 2 on 12/06/2018.  -Echocardiogram on 01/02/2019 shows EF of 60 to 65%. - She is tolerating Herceptin and Perjeta very well.  Denies any diarrhea.  She is very active at home.  She may proceed with her next cycle today. - RTC in 3 weeks.  I will plan to repeat scans after next cycle.  2.  Diarrhea: -She reports diarrhea every fourth day.  She is controlling it with Imodium.    Total time spent is 25 minutes with more than 50% of the time spent face-to-face discussing treatment plan and coordination of care.     Orders placed this encounter:  Orders Placed This Encounter  Procedures   CBC with Differential   Comprehensive metabolic panel      Derek Jack, MD [place] [phone]

## 2019-01-28 DIAGNOSIS — H25811 Combined forms of age-related cataract, right eye: Secondary | ICD-10-CM | POA: Diagnosis not present

## 2019-01-30 ENCOUNTER — Encounter (HOSPITAL_COMMUNITY): Payer: Self-pay

## 2019-01-30 ENCOUNTER — Other Ambulatory Visit: Payer: Self-pay

## 2019-01-31 ENCOUNTER — Other Ambulatory Visit (HOSPITAL_COMMUNITY)
Admission: RE | Admit: 2019-01-31 | Discharge: 2019-01-31 | Disposition: A | Discharge status: Home / Self Care | Payer: Medicare Other | Source: Ambulatory Visit | Attending: Ophthalmology | Admitting: Ophthalmology

## 2019-01-31 ENCOUNTER — Encounter (HOSPITAL_COMMUNITY)
Admission: RE | Admit: 2019-01-31 | Discharge: 2019-01-31 | Disposition: A | Discharge status: Home / Self Care | Payer: Medicare Other | Source: Ambulatory Visit | Attending: Ophthalmology | Admitting: Ophthalmology

## 2019-01-31 DIAGNOSIS — Z1159 Encounter for screening for other viral diseases: Secondary | ICD-10-CM | POA: Diagnosis not present

## 2019-02-01 LAB — NOVEL CORONAVIRUS, NAA (HOSP ORDER, SEND-OUT TO REF LAB; TAT 18-24 HRS): SARS-CoV-2, NAA: NOT DETECTED

## 2019-02-04 ENCOUNTER — Ambulatory Visit (HOSPITAL_COMMUNITY)
Admission: RE | Admit: 2019-02-04 | Discharge: 2019-02-04 | Disposition: A | Discharge status: Home / Self Care | Payer: Medicare Other | Attending: Ophthalmology | Admitting: Ophthalmology

## 2019-02-04 ENCOUNTER — Other Ambulatory Visit: Payer: Self-pay

## 2019-02-04 ENCOUNTER — Ambulatory Visit (HOSPITAL_COMMUNITY): Payer: Medicare Other | Admitting: Anesthesiology

## 2019-02-04 ENCOUNTER — Encounter (HOSPITAL_COMMUNITY)
Admission: RE | Disposition: A | Discharge status: Home / Self Care | Payer: Self-pay | Source: Home / Self Care | Attending: Ophthalmology

## 2019-02-04 ENCOUNTER — Encounter (HOSPITAL_COMMUNITY): Payer: Self-pay

## 2019-02-04 DIAGNOSIS — H2511 Age-related nuclear cataract, right eye: Secondary | ICD-10-CM | POA: Diagnosis not present

## 2019-02-04 DIAGNOSIS — Z7982 Long term (current) use of aspirin: Secondary | ICD-10-CM | POA: Diagnosis not present

## 2019-02-04 DIAGNOSIS — E7800 Pure hypercholesterolemia, unspecified: Secondary | ICD-10-CM | POA: Insufficient documentation

## 2019-02-04 DIAGNOSIS — I10 Essential (primary) hypertension: Secondary | ICD-10-CM | POA: Diagnosis not present

## 2019-02-04 DIAGNOSIS — Z9221 Personal history of antineoplastic chemotherapy: Secondary | ICD-10-CM | POA: Insufficient documentation

## 2019-02-04 DIAGNOSIS — Z79899 Other long term (current) drug therapy: Secondary | ICD-10-CM | POA: Insufficient documentation

## 2019-02-04 DIAGNOSIS — H259 Unspecified age-related cataract: Secondary | ICD-10-CM | POA: Diagnosis not present

## 2019-02-04 DIAGNOSIS — Z853 Personal history of malignant neoplasm of breast: Secondary | ICD-10-CM | POA: Insufficient documentation

## 2019-02-04 DIAGNOSIS — H25811 Combined forms of age-related cataract, right eye: Secondary | ICD-10-CM | POA: Diagnosis not present

## 2019-02-04 HISTORY — PX: CATARACT EXTRACTION W/PHACO: SHX586

## 2019-02-04 SURGERY — PHACOEMULSIFICATION, CATARACT, WITH IOL INSERTION
Anesthesia: Monitor Anesthesia Care | Site: Eye | Laterality: Right

## 2019-02-04 MED ORDER — TETRACAINE HCL 0.5 % OP SOLN
1.0000 [drp] | OPHTHALMIC | Status: AC
  Administered 2019-02-04 (×3): 1 [drp] via OPHTHALMIC

## 2019-02-04 MED ORDER — PROVISC 10 MG/ML IO SOLN
INTRAOCULAR | Status: DC | PRN
  Administered 2019-02-04: 0.85 mL via INTRAOCULAR

## 2019-02-04 MED ORDER — BSS IO SOLN
INTRAOCULAR | Status: DC | PRN
  Administered 2019-02-04: 15 mL

## 2019-02-04 MED ORDER — LIDOCAINE HCL (PF) 1 % IJ SOLN
INTRAOCULAR | Status: DC | PRN
  Administered 2019-02-04: 1 mL via OPHTHALMIC

## 2019-02-04 MED ORDER — EPINEPHRINE PF 1 MG/ML IJ SOLN
INTRAOCULAR | Status: DC | PRN
  Administered 2019-02-04: 500 mL

## 2019-02-04 MED ORDER — NEOMYCIN-POLYMYXIN-DEXAMETH 3.5-10000-0.1 OP SUSP
OPHTHALMIC | Status: DC | PRN
  Administered 2019-02-04: 2 [drp] via OPHTHALMIC

## 2019-02-04 MED ORDER — SODIUM HYALURONATE 23 MG/ML IO SOLN
INTRAOCULAR | Status: DC | PRN
  Administered 2019-02-04: 0.6 mL via INTRAOCULAR

## 2019-02-04 MED ORDER — CYCLOPENTOLATE-PHENYLEPHRINE 0.2-1 % OP SOLN
1.0000 [drp] | OPHTHALMIC | Status: AC
  Administered 2019-02-04 (×3): 1 [drp] via OPHTHALMIC

## 2019-02-04 MED ORDER — PHENYLEPHRINE HCL 2.5 % OP SOLN
1.0000 [drp] | OPHTHALMIC | Status: AC
  Administered 2019-02-04 (×3): 1 [drp] via OPHTHALMIC

## 2019-02-04 MED ORDER — LIDOCAINE HCL 3.5 % OP GEL: 1.0000 "application " | Freq: Once | OPHTHALMIC | Status: DC

## 2019-02-04 MED ORDER — POVIDONE-IODINE 5 % OP SOLN
OPHTHALMIC | Status: DC | PRN
  Administered 2019-02-04: 1 via OPHTHALMIC

## 2019-02-04 SURGICAL SUPPLY — 15 items
CLOTH BEACON ORANGE TIMEOUT ST (SAFETY) ×2 IMPLANT
GLOVE BIOGEL PI IND STRL 6.5 (GLOVE) IMPLANT
GLOVE BIOGEL PI IND STRL 7.0 (GLOVE) IMPLANT
GLOVE BIOGEL PI INDICATOR 6.5 (GLOVE) ×2
GLOVE BIOGEL PI INDICATOR 7.0 (GLOVE) ×2
LENS ALC ACRYL/TECN (Ophthalmic Related) ×2 IMPLANT
NDL HYPO 18GX1.5 BLUNT FILL (NEEDLE) IMPLANT
NEEDLE HYPO 18GX1.5 BLUNT FILL (NEEDLE) ×3 IMPLANT
PAD ARMBOARD 7.5X6 YLW CONV (MISCELLANEOUS) ×2 IMPLANT
SHIELD EYE LENSE ONLY DISP (GAUZE/BANDAGES/DRESSINGS) ×2 IMPLANT
SYR TB 1ML LL NO SAFETY (SYRINGE) ×2 IMPLANT
TAPE SURG TRANSPORE 1 IN (GAUZE/BANDAGES/DRESSINGS) IMPLANT
TAPE SURGICAL TRANSPORE 1 IN (GAUZE/BANDAGES/DRESSINGS) ×2
VISCOELASTIC ADDITIONAL (OPHTHALMIC RELATED) ×2 IMPLANT
WATER STERILE IRR 250ML POUR (IV SOLUTION) ×2 IMPLANT

## 2019-02-04 NOTE — Anesthesia Preprocedure Evaluation (Signed)
Anesthesia Evaluation  Patient identified by MRN, date of birth, ID band Patient awake    Reviewed: Allergy & Precautions, NPO status , Patient's Chart, lab work & pertinent test results  Airway Mallampati: III  TM Distance: >3 FB Neck ROM: Full    Dental no notable dental hx.    Pulmonary neg pulmonary ROS,    Pulmonary exam normal breath sounds clear to auscultation       Cardiovascular Exercise Tolerance: Good hypertension, Pt. on medications and Pt. on home beta blockers + angina with exertion Normal cardiovascular examI Rhythm:Regular Rate:Normal  Vacuums/dusts and feeds 6 people  States decreased ET with Chemo -denies CP   Neuro/Psych negative neurological ROS  negative psych ROS   GI/Hepatic negative GI ROS, Neg liver ROS,   Endo/Other  negative endocrine ROS  Renal/GU negative Renal ROS  negative genitourinary   Musculoskeletal negative musculoskeletal ROS (+)   Abdominal   Peds negative pediatric ROS (+)  Hematology negative hematology ROS (+)   Anesthesia Other Findings   Reproductive/Obstetrics negative OB ROS                             Anesthesia Physical Anesthesia Plan  ASA: III  Anesthesia Plan: MAC   Post-op Pain Management:    Induction: Intravenous  PONV Risk Score and Plan:   Airway Management Planned: Nasal Cannula and Simple Face Mask  Additional Equipment:   Intra-op Plan:   Post-operative Plan: Extubation in OR  Informed Consent: I have reviewed the patients History and Physical, chart, labs and discussed the procedure including the risks, benefits and alternatives for the proposed anesthesia with the patient or authorized representative who has indicated his/her understanding and acceptance.     Dental advisory given  Plan Discussed with: CRNA  Anesthesia Plan Comments: (Plan MAC -GA as needed  Plan Full PPE use )        Anesthesia  Quick Evaluation

## 2019-02-04 NOTE — Anesthesia Postprocedure Evaluation (Signed)
Anesthesia Post Note  Patient: Carol Duarte  Procedure(s) Performed: CATARACT EXTRACTION PHACO AND INTRAOCULAR LENS PLACEMENT (IOC) (Right Eye)  Patient location during evaluation: PACU Anesthesia Type: MAC Level of consciousness: awake and alert Pain management: pain level controlled Vital Signs Assessment: post-procedure vital signs reviewed and stable Respiratory status: spontaneous breathing Cardiovascular status: stable Postop Assessment: adequate PO intake Anesthetic complications: no     Last Vitals:  Vitals:   02/04/19 0744 02/04/19 0935  BP: 136/63 139/60  Pulse: (!) 47 (!) 54  Resp: (!) 26 (!) 24  Temp: 36.6 C 36.9 C  SpO2: 98% 99%    Last Pain:  Vitals:   02/04/19 0935  TempSrc: Oral  PainSc: 0-No pain                 [name]

## 2019-02-04 NOTE — Op Note (Signed)
Date of procedure: 02/04/19  Pre-operative diagnosis: Visually significant age-related cataract, Right Eye (H25.?1)  Post-operative diagnosis: Visually significant age-related cataract, Right Eye  Procedure: Removal of cataract via phacoemulsification and insertion of intra-ocular lens Wynetta Emery and [employer] PCB00  +23.5D into the capsular bag of the Right Eye  Attending surgeon: Gerda Diss. Tadeo Besecker, MD, MA  Anesthesia: MAC, Topical Akten  Complications: None  Estimated Blood Loss: <35m (minimal)  Specimens: None  Implants: As above  Indications:  Visually significant age-related cataract, Right Eye  Procedure:  The patient was seen and identified in the pre-operative area. The operative eye was identified and dilated.  The operative eye was marked.  Topical anesthesia was administered to the operative eye.     The patient was then to the operative suite and placed in the supine position.  A timeout was performed confirming the patient, procedure to be performed, and all other relevant information.   The patient's face was prepped and draped in the usual fashion for intra-ocular surgery.  A lid speculum was placed into the operative eye and the surgical microscope moved into place and focused.  A superotemporal paracentesis was created using a 20 gauge paracentesis blade.  Shugarcaine was injected into the anterior chamber.  Viscoelastic was injected into the anterior chamber.  A temporal clear-corneal main wound incision was created using a 2.439mmicrokeratome.  A continuous curvilinear capsulorrhexis was initiated using an irrigating cystitome and completed using capsulorrhexis forceps.  Hydrodissection and hydrodeliniation were performed.  Viscoelastic was injected into the anterior chamber.  A phacoemulsification handpiece and a chopper as a second instrument were used to remove the nucleus and epinucleus. The irrigation/aspiration handpiece was used to remove any remaining cortical  material.   The capsular bag was reinflated with viscoelastic, checked, and found to be intact.  The intraocular lens was inserted into the capsular bag and dialed into place using a Kuglen hook.  The irrigation/aspiration handpiece was used to remove any remaining viscoelastic.  The clear corneal wound and paracentesis wounds were then hydrated and checked with Weck-Cels to be watertight.  The lid-speculum and drape was removed, and the patient's face was cleaned with a wet and dry 4x4.  Maxitrol was instilled in the eye before a clear shield was taped over the eye. The patient was taken to the post-operative care unit in good condition, having tolerated the procedure well.  Post-Op Instructions: [facility] and will receive all other orders and instructions.

## 2019-02-04 NOTE — Transfer of Care (Addendum)
Immediate Anesthesia Transfer of Care Note  Patient: Carol Duarte  Procedure(s) Performed: CATARACT EXTRACTION PHACO AND INTRAOCULAR LENS PLACEMENT (IOC) (Right Eye)  Patient Location: PACU  Anesthesia Type:MAC  Level of Consciousness: awake, alert , oriented and patient cooperative  Airway & Oxygen Therapy: Patient Spontanous Breathing  Post-op Assessment: Report given to RN and Post -op Vital signs reviewed and stable  Post vital signs: Reviewed and stable  Last Vitals:  Vitals Value Taken Time  BP    Temp    Pulse    Resp    SpO2      Last Pain:  Vitals:   02/04/19 0935  TempSrc: Oral  PainSc: 0-No pain         Complications: No apparent anesthesia complications

## 2019-02-04 NOTE — H&P (Signed)
The H and P was reviewed and updated. The patient was examined.  No changes were found after exam.  The surgical eye was marked.  

## 2019-02-04 NOTE — Discharge Instructions (Addendum)
Please discharge patient when stable, will follow up today with Dr. Wrzosek at the [facility] office immediately following discharge.  Leave shield in place until visit.  All paperwork with discharge instructions will be given at the office. ° °

## 2019-02-05 ENCOUNTER — Encounter (HOSPITAL_COMMUNITY): Payer: Self-pay | Admitting: Ophthalmology

## 2019-02-07 ENCOUNTER — Other Ambulatory Visit (HOSPITAL_COMMUNITY): Payer: Self-pay | Admitting: Nurse Practitioner

## 2019-02-07 ENCOUNTER — Other Ambulatory Visit: Payer: Self-pay

## 2019-02-08 ENCOUNTER — Encounter (HOSPITAL_COMMUNITY): Payer: Self-pay | Admitting: Hematology

## 2019-02-08 ENCOUNTER — Inpatient Hospital Stay (HOSPITAL_COMMUNITY): Payer: Medicare Other

## 2019-02-08 ENCOUNTER — Inpatient Hospital Stay (HOSPITAL_BASED_OUTPATIENT_CLINIC_OR_DEPARTMENT_OTHER): Payer: Medicare Other | Admitting: Hematology

## 2019-02-08 ENCOUNTER — Inpatient Hospital Stay (HOSPITAL_COMMUNITY): Payer: Medicare Other | Attending: Hematology

## 2019-02-08 VITALS — BP 136/68 | HR 60 | Temp 98.0°F | Resp 20

## 2019-02-08 DIAGNOSIS — C7800 Secondary malignant neoplasm of unspecified lung: Secondary | ICD-10-CM

## 2019-02-08 DIAGNOSIS — I10 Essential (primary) hypertension: Secondary | ICD-10-CM | POA: Diagnosis not present

## 2019-02-08 DIAGNOSIS — E7800 Pure hypercholesterolemia, unspecified: Secondary | ICD-10-CM | POA: Diagnosis not present

## 2019-02-08 DIAGNOSIS — Z9012 Acquired absence of left breast and nipple: Secondary | ICD-10-CM

## 2019-02-08 DIAGNOSIS — Z7982 Long term (current) use of aspirin: Secondary | ICD-10-CM | POA: Insufficient documentation

## 2019-02-08 DIAGNOSIS — Z79899 Other long term (current) drug therapy: Secondary | ICD-10-CM | POA: Diagnosis not present

## 2019-02-08 DIAGNOSIS — Z5112 Encounter for antineoplastic immunotherapy: Secondary | ICD-10-CM | POA: Insufficient documentation

## 2019-02-08 DIAGNOSIS — Z171 Estrogen receptor negative status [ER-]: Secondary | ICD-10-CM | POA: Diagnosis not present

## 2019-02-08 DIAGNOSIS — C50912 Malignant neoplasm of unspecified site of left female breast: Secondary | ICD-10-CM

## 2019-02-08 DIAGNOSIS — C50512 Malignant neoplasm of lower-outer quadrant of left female breast: Secondary | ICD-10-CM

## 2019-02-08 LAB — CBC WITH DIFFERENTIAL/PLATELET
Abs Immature Granulocytes: 0.01 10*3/uL (ref 0.00–0.07)
Basophils Absolute: 0 10*3/uL (ref 0.0–0.1)
Basophils Relative: 1 %
Eosinophils Absolute: 0.3 10*3/uL (ref 0.0–0.5)
Eosinophils Relative: 6 %
HCT: 32.5 % — ABNORMAL LOW (ref 36.0–46.0)
Hemoglobin: 10.3 g/dL — ABNORMAL LOW (ref 12.0–15.0)
Immature Granulocytes: 0 %
Lymphocytes Relative: 21 %
Lymphs Abs: 0.9 10*3/uL (ref 0.7–4.0)
MCH: 30.7 pg (ref 26.0–34.0)
MCHC: 31.7 g/dL (ref 30.0–36.0)
MCV: 96.7 fL (ref 80.0–100.0)
Monocytes Absolute: 0.4 10*3/uL (ref 0.1–1.0)
Monocytes Relative: 9 %
Neutro Abs: 2.6 10*3/uL (ref 1.7–7.7)
Neutrophils Relative %: 63 %
Platelets: 188 10*3/uL (ref 150–400)
RBC: 3.36 MIL/uL — ABNORMAL LOW (ref 3.87–5.11)
RDW: 13.5 % (ref 11.5–15.5)
WBC: 4.2 10*3/uL (ref 4.0–10.5)
nRBC: 0 % (ref 0.0–0.2)

## 2019-02-08 LAB — COMPREHENSIVE METABOLIC PANEL
ALT: 16 U/L (ref 0–44)
AST: 17 U/L (ref 15–41)
Albumin: 3.5 g/dL (ref 3.5–5.0)
Alkaline Phosphatase: 54 U/L (ref 38–126)
Anion gap: 9 (ref 5–15)
BUN: 23 mg/dL (ref 8–23)
CO2: 23 mmol/L (ref 22–32)
Calcium: 8.6 mg/dL — ABNORMAL LOW (ref 8.9–10.3)
Chloride: 109 mmol/L (ref 98–111)
Creatinine, Ser: 1.22 mg/dL — ABNORMAL HIGH (ref 0.44–1.00)
GFR calc Af Amer: 45 mL/min — ABNORMAL LOW (ref >60)
GFR calc non Af Amer: 38 mL/min — ABNORMAL LOW (ref >60)
Glucose, Bld: 136 mg/dL — ABNORMAL HIGH (ref 70–99)
Potassium: 4.3 mmol/L (ref 3.5–5.1)
Sodium: 141 mmol/L (ref 135–145)
Total Bilirubin: 0.6 mg/dL (ref 0.3–1.2)
Total Protein: 5.7 g/dL — ABNORMAL LOW (ref 6.5–8.1)

## 2019-02-08 MED ORDER — SODIUM CHLORIDE 0.9% FLUSH
10.0000 mL | INTRAVENOUS | Status: DC | PRN
  Administered 2019-02-08: 10 mL
  Filled 2019-02-08: qty 10

## 2019-02-08 MED ORDER — DIPHENHYDRAMINE HCL 25 MG PO CAPS
25.0000 mg | ORAL_CAPSULE | Freq: Once | ORAL | Status: AC
  Administered 2019-02-08: 25 mg via ORAL
  Filled 2019-02-08: qty 1

## 2019-02-08 MED ORDER — HEPARIN SOD (PORK) LOCK FLUSH 100 UNIT/ML IV SOLN
500.0000 [IU] | Freq: Once | INTRAVENOUS | Status: AC | PRN
  Administered 2019-02-08: 500 [IU]

## 2019-02-08 MED ORDER — SODIUM CHLORIDE 0.9 % IV SOLN
420.0000 mg | Freq: Once | INTRAVENOUS | Status: AC
  Administered 2019-02-08: 420 mg via INTRAVENOUS
  Filled 2019-02-08: qty 14

## 2019-02-08 MED ORDER — SODIUM CHLORIDE 0.9 % IV SOLN
Freq: Once | INTRAVENOUS | Status: AC
  Administered 2019-02-08: 09:00:00 via INTRAVENOUS

## 2019-02-08 MED ORDER — ACETAMINOPHEN 325 MG PO TABS
650.0000 mg | ORAL_TABLET | Freq: Once | ORAL | Status: AC
  Administered 2019-02-08: 650 mg via ORAL
  Filled 2019-02-08: qty 2

## 2019-02-08 MED ORDER — TRASTUZUMAB CHEMO 150 MG IV SOLR
6.0000 mg/kg | Freq: Once | INTRAVENOUS | Status: AC
  Administered 2019-02-08: 378 mg via INTRAVENOUS
  Filled 2019-02-08: qty 18

## 2019-02-08 NOTE — Assessment & Plan Note (Signed)
1.  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  No lymph node sampling was done. - In July 2019, she noticed a lump at her prior lumpectomy site, which was painful.  She did not want to have mammogram done. -Biopsy of this lump on 03/28/2018 by Dr. Arnoldo Morale showed invasive ductal carcinoma with calcifications (2.2 cm), ER negative, PR negative, Ki-67 40%, HER-2 3+ positive by IHC.  3/3 lymph nodes positive. - Left modified radical mastectomy on 05/28/2018.  -PET/CT scan on 07/23/2018 shows few scattered mildly hypermetabolic pulmonary nodules, hypermetabolic left axillary, right paratracheal, right hilar, subcarinal adenopathy compatible with metastatic disease in the chest.  -2D echo on 08/03/2018 shows EF of 65 to 70%.   - 4 cycles of single agent Herceptin every 3 weeks from 08/09/2018 through 10/18/2018. - PET CT scan on 10/30/2018 showed mild progression of pulmonary metastasis.  Stable mediastinal lymph nodes.  2 adjacent hypermetabolic left axillary lymph nodes, previously only 1 lymph node noted.  No other evidence of metastasis in the abdomen or pelvis or skeleton. -Perjeta and Herceptin started on 11/15/2018.  She will continue therapy until disease progression or unacceptable toxicity. -Echocardiogram on 01/02/2019 shows EF of 60 to 65%. - She is tolerating Herceptin and Perjeta very well.  Denies any diarrhea.  She is very active at home.  She may proceed with her next cycle today. - Recommend repeat PET/CT. - RTC in 3 weeks.   2.  Diarrhea: -She reports diarrhea every fourth day.  She is controlling it with Imodium.

## 2019-02-08 NOTE — Progress Notes (Signed)
Carol Duarte, [place] [address]   CLINIC:  Medical Oncology/Hematology  PCP:  [name], MD [place] [address] [phone]   REASON FOR VISIT:  Follow-up for Metastatic Breast Cancer   CURRENT THERAPY: Perjeta and Herceptin   BRIEF ONCOLOGIC HISTORY:  Oncology History  Invasive ductal carcinoma of left breast ([facility])  03/30/2017 Initial Diagnosis   Invasive ductal carcinoma of left breast ([place])   08/09/2018 -  Chemotherapy   The patient had trastuzumab (HERCEPTIN) 500 mg in sodium chloride 0.9 % 250 mL chemo infusion, 525 mg, Intravenous,  Once, 9 of 10 cycles Administration: 500 mg (08/09/2018), 350 mg (09/06/2018), 350 mg (09/27/2018), 378 mg (10/18/2018), 378 mg (11/15/2018), 378 mg (12/06/2018), 378 mg (12/28/2018), 378 mg (01/18/2019) pertuzumab (PERJETA) 840 mg in sodium chloride 0.9 % 250 mL chemo infusion, 840 mg (100 % of original dose 840 mg), Intravenous, Once, 5 of 6 cycles Dose modification: 840 mg (original dose 840 mg, Cycle 5), 420 mg (original dose 420 mg, Cycle 6) Administration: 840 mg (11/15/2018), 420 mg (12/06/2018), 420 mg (12/28/2018), 420 mg (01/18/2019)  for chemotherapy treatment.    09/18/2018 Genetic Testing   Negative genetic testing for the breast and gyn cancer panel.  The Breast/GYN gene panel offered by [facility] includes sequencing and rearrangement analysis for the following 23 genes:  ATM, BRCA1, BRCA2, BRIP1, CDH1, CHEK2, EPCAM, MLH1, MSH2, MSH6, NBN, NF1, PALB2, PMS2, PTEN, RAD51C, RAD51D, STK11, and TP53.   The report date is 09/18/2018.       INTERVAL HISTORY:  Carol Duarte 85 y.o. female presents today for follow-up.  Reports overall doing well.  She is currently on treatment with Herceptin and Perjeta tolerating well.  Denies any new bony pain.  Denies any change in appetite.  She denies any change in bowel habits.  Denies any fevers, chills, night sweats.  States she is ready to proceed with  treatment today.   REVIEW OF SYSTEMS:  Review of Systems  Constitutional: Negative.   HENT:  Negative.   Eyes: Negative.   Respiratory: Negative.   Cardiovascular: Negative.   Gastrointestinal: Negative.   Endocrine: Negative.   Genitourinary: Negative.    Musculoskeletal: Negative.   Skin: Negative.   Neurological: Negative.   Hematological: Negative.   Psychiatric/Behavioral: Negative.      PAST MEDICAL/SURGICAL HISTORY:  Past Medical History:  Diagnosis Date   Cancer [facility])    left breast   History of kidney stones    Hypercholesteremia    Hypertension    Past Surgical History:  Procedure Laterality Date   APPENDECTOMY     brain cyst removed  2011   BREAST BIOPSY Left 03/28/2018   Procedure: BREAST BIOPSY;  Surgeon: [name], MD;  Location: [facility];  Service: General;  Laterality: Left;   CATARACT EXTRACTION W/PHACO Left 11/09/2015   Procedure: CATARACT EXTRACTION PHACO AND INTRAOCULAR LENS PLACEMENT LEFT EYE cde=8.97;  Surgeon: [name], MD;  Location: [facility];  Service: Ophthalmology;  Laterality: Left;   CATARACT EXTRACTION W/PHACO Right 02/04/2019   Procedure: CATARACT EXTRACTION PHACO AND INTRAOCULAR LENS PLACEMENT (IOC);  Surgeon: [name], MD;  Location: [facility];  Service: Ophthalmology;  Laterality: Right;  CDE: 15.19   EYE SURGERY     KPE left   MASTECTOMY MODIFIED RADICAL Left 05/28/2018   Procedure: LEFT MODIFIED RADICAL MASTECTOMY;  Surgeon: [name], MD;  Location: [facility];  Service: General;  Laterality: Left;   MASTECTOMY, PARTIAL Left  04/24/2017   Procedure: MASTECTOMY PARTIAL;  Surgeon: [name], MD;  Location: [facility];  Service: General;  Laterality: Left;   PORTACATH PLACEMENT Right 08/08/2018   Procedure: INSERTION PORT-A-CATH;  Surgeon: [name], MD;  Location: [facility];  Service: General;  Laterality: Right;   TONSILLECTOMY     TONSILLECTOMY AND ADENOIDECTOMY       SOCIAL HISTORY:  Social History    Socioeconomic History   Marital status: Widowed    Spouse name: Not on file   Number of children: Not on file   Years of education: Not on file   Highest education level: Not on file  Occupational History   Not on file  Social Needs   Financial resource strain: Not hard at all   Food insecurity    Worry: Never true    Inability: Never true   Transportation needs    Medical: No    Non-medical: No  Tobacco Use   Smoking status: Never Smoker   Smokeless tobacco: Never Used  Substance and Sexual Activity   Alcohol use: No   Drug use: No   Sexual activity: Not Currently    Partners: Male  Lifestyle   Physical activity    Days per week: Patient refused    Minutes per session: Patient refused   Stress: Not at all  Relationships   Social connections    Talks on phone: Patient refused    Gets together: Patient refused    Attends religious service: Patient refused    Active member of club or organization: Patient refused    Attends meetings of clubs or organizations: Patient refused    Relationship status: Patient refused   Intimate partner violence    Fear of current or ex partner: Patient refused    Emotionally abused: Patient refused    Physically abused: Patient refused    Forced sexual activity: Patient refused  Other Topics Concern   Not on file  Social History Narrative   Not on file    FAMILY HISTORY:  Family History  Problem Relation Age of Onset   Heart failure Mother    Heart failure Father    Congestive Heart Failure Brother    Tuberculosis Paternal Uncle    Tuberculosis Maternal Grandmother    Tuberculosis Maternal Grandfather    Congestive Heart Failure Brother     CURRENT MEDICATIONS:  Outpatient Encounter Medications as of 02/08/2019  Medication Sig   amLODipine (NORVASC) 5 MG tablet Take 5 mg by mouth daily.   aspirin EC 81 MG tablet Take 81 mg by mouth 2 (two) times a day.   ferrous sulfate 325 (65 FE) MG tablet  Take 325 mg by mouth daily with breakfast.   labetalol (NORMODYNE) 100 MG tablet Take 1 tablet (100 mg total) by mouth 2 (two) times daily. (Patient taking differently: Take 100 mg by mouth daily. )   lidocaine-prilocaine (EMLA) cream Apply 1 application topically as needed (port access).   LORazepam (ATIVAN) 1 MG tablet Take 1 mg by mouth daily.    naproxen sodium (ALEVE) 220 MG tablet Take 220 mg by mouth daily as needed (for headache or pain).   oxybutynin (DITROPAN) 5 MG tablet Take 5 mg by mouth daily.    prochlorperazine (COMPAZINE) 10 MG tablet Take 1 tablet (10 mg total) by mouth every 6 (six) hours as needed (Nausea or vomiting). (Patient taking differently: Take 10 mg by mouth daily. )   simvastatin (ZOCOR) 40 MG tablet Take 40 mg by mouth  daily.   Trastuzumab (HERCEPTIN IV) Inject 525 mg into the vein every 21 ( twenty-one) days.   No facility-administered encounter medications on file as of 02/08/2019.     ALLERGIES:  No Known Allergies   PHYSICAL EXAM:  ECOG Performance status: 1  Vitals:   02/08/19 0819  BP: (!) 118/55  Pulse: (!) 52  Resp: 20  Temp: 98.4 F (36.9 C)  SpO2: 98%   Filed Weights   02/08/19 0819  Weight: 130 lb 6.4 oz (59.1 kg)    Physical Exam Constitutional:      Appearance: Normal appearance. She is normal weight.  HENT:     Head: Normocephalic.     Mouth/Throat:     Mouth: Mucous membranes are moist.     Pharynx: Oropharynx is clear.  Eyes:     Extraocular Movements: Extraocular movements intact.     Conjunctiva/sclera: Conjunctivae normal.  Neck:     Musculoskeletal: Normal range of motion.  Cardiovascular:     Rate and Rhythm: Normal rate and regular rhythm.     Pulses: Normal pulses.     Heart sounds: Normal heart sounds.  Pulmonary:     Effort: Pulmonary effort is normal.     Breath sounds: Normal breath sounds.  Abdominal:     General: Bowel sounds are normal.  Musculoskeletal: Normal range of motion.  Skin:     General: Skin is warm and dry.  Neurological:     General: No focal deficit present.     Mental Status: She is oriented to person, place, and time.  Psychiatric:        Mood and Affect: Mood normal.        Behavior: Behavior normal.        Thought Content: Thought content normal.        Judgment: Judgment normal.      LABORATORY DATA:  I have reviewed the labs as listed.  CBC    Component Value Date/Time   WBC 4.2 02/08/2019 0817   RBC 3.36 (L) 02/08/2019 0817   HGB 10.3 (L) 02/08/2019 0817   HCT 32.5 (L) 02/08/2019 0817   PLT 188 02/08/2019 0817   MCV 96.7 02/08/2019 0817   MCH 30.7 02/08/2019 0817   MCHC 31.7 02/08/2019 0817   RDW 13.5 02/08/2019 0817   LYMPHSABS 0.9 02/08/2019 0817   MONOABS 0.4 02/08/2019 0817   EOSABS 0.3 02/08/2019 0817   BASOSABS 0.0 02/08/2019 0817   CMP Latest Ref Rng & Units 02/08/2019 01/18/2019 12/28/2018  Glucose 70 - 99 mg/dL 136(H) 115(H) 138(H)  BUN 8 - 23 mg/dL 23 27(H) 19  Creatinine 0.44 - 1.00 mg/dL 1.22(H) 1.21(H) 1.17(H)  Sodium 135 - 145 mmol/L 141 139 142  Potassium 3.5 - 5.1 mmol/L 4.3 4.1 4.0  Chloride 98 - 111 mmol/L 109 108 110  CO2 22 - 32 mmol/L _0 Calcium 8.9 - 10.3 mg/dL 8.6(L) 8.7(L) 8.8(L)  Total Protein 6.5 - 8.1 g/dL 5.7(L) 5.6(L) 5.7(L)  Total Bilirubin 0.3 - 1.2 mg/dL 0.6 0.5 0.5  Alkaline Phos 38 - 126 U/L 54 53 55  AST 15 - 41 U/L _1 ALT 0 - 44 U/L _2 ASSESSMENT & PLAN:   Invasive ductal carcinoma of left breast ([facility]) 1.  Metastatic HER-2 positive left breast cancer: - Initial biopsy of the left breast lesion on 03/14/2017, ER 20% positive, PR negative, HER-2 negative by FISH, Ki-67 20%, status post lumpectomy on 04/24/2017  with pathology showing 4.2 cm invasive ductal carcinoma, grade 3, invasive carcinoma focally 0.1 cm from the posterior and anterior margins.  No lymph node sampling was done. - In July 2019, she noticed a lump at her prior lumpectomy site, which was painful.  She did  not want to have mammogram done. -Biopsy of this lump on 03/28/2018 by Dr. Arnoldo Morale showed invasive ductal carcinoma with calcifications (2.2 cm), ER negative, PR negative, Ki-67 40%, HER-2 3+ positive by IHC.  3/3 lymph nodes positive. - Left modified radical mastectomy on 05/28/2018.  -PET/CT scan on 07/23/2018 shows few scattered mildly hypermetabolic pulmonary nodules, hypermetabolic left axillary, right paratracheal, right hilar, subcarinal adenopathy compatible with metastatic disease in the chest.  -2D echo on 08/03/2018 shows EF of 65 to 70%.   - 4 cycles of single agent Herceptin every 3 weeks from 08/09/2018 through 10/18/2018. - PET CT scan on 10/30/2018 showed mild progression of pulmonary metastasis.  Stable mediastinal lymph nodes.  2 adjacent hypermetabolic left axillary lymph nodes, previously only 1 lymph node noted.  No other evidence of metastasis in the abdomen or pelvis or skeleton. -Perjeta and Herceptin started on 11/15/2018.  She will continue therapy until disease progression or unacceptable toxicity. -Echocardiogram on 01/02/2019 shows EF of 60 to 65%. - She is tolerating Herceptin and Perjeta very well.  Denies any diarrhea.  She is very active at home.  She may proceed with her next cycle today. - Recommend repeat PET/CT. - RTC in 3 weeks.   2.  Diarrhea: -She reports diarrhea every fourth day.  She is controlling it with Imodium.       Orders placed this encounter:  Orders Placed This Encounter  Procedures   NM PET Image Restag (PS) Skull Base To Thigh   CBC with Differential   Comprehensive metabolic panel      Derek Jack, MD [place] [phone]

## 2019-02-08 NOTE — Progress Notes (Signed)
Patient to the treatment room for therapy and oncology visit.  Stated fair appetite but no other complaints.  No s/s of distress noted.   Patient seen my Reynolds Bowl, NP, with verbal order ok to treat today.   ECHO 01/02/2019  Patient tolerated chemotherapy with no complaints voiced.  Port site clean and dry with no bruising or swelling noted at site.  Good blood return noted before and after administration of chemotherapy.  Band aid applied.  Patient left ambulatory with VSS and no s/s of distress noted.

## 2019-02-08 NOTE — Patient Instructions (Signed)
[facility] Discharge Instructions  Labs drawn from portacath today   Thank you for choosing [facility] at [facility] to provide your oncology and hematology care.  To afford each patient quality time with our provider, please arrive at least 15 minutes before your scheduled appointment time.   If you have a lab appointment with the Cancer Center please come in thru the  Main Entrance and check in at the main information desk  You need to re-schedule your appointment should you arrive 10 or more minutes late.  We strive to give you quality time with our providers, and arriving late affects you and other patients whose appointments are after yours.  Also, if you no show three or more times for appointments you may be dismissed from the clinic at the providers discretion.     Again, thank you for choosing [facility].  Our hope is that these requests will decrease the amount of time that you wait before being seen by our physicians.       _____________________________________________________________  Should you have questions after your visit to [facility], please contact our office at [phone] between the hours of 8:00 a.m. and 4:30 p.m.  Voicemails left after 4:00 p.m. will not be returned until the following business day.  For prescription refill requests, have your pharmacy contact our office and allow 72 hours.    Cancer Center Support Programs:   > Cancer Support Group  2nd Tuesday of the month 1pm-2pm, Journey Room   

## 2019-02-13 ENCOUNTER — Other Ambulatory Visit (HOSPITAL_COMMUNITY): Payer: Self-pay | Admitting: *Deleted

## 2019-02-13 ENCOUNTER — Telehealth (HOSPITAL_COMMUNITY): Payer: Self-pay | Admitting: *Deleted

## 2019-02-13 NOTE — Telephone Encounter (Signed)
-----   Message from Donetta Potts, RN sent at 02/13/2019  1:33 PM EDT ----- Mrs. Demore has severe claustrophobia.  Will you ask Dr. Raliegh Ip what he wants to call in for her and send it to [facility] in [place].  Or if you will just ask him, I will send it in.

## 2019-02-13 NOTE — Telephone Encounter (Signed)
Xanax 0.5mg  called in to her pharmacy.  Patient's son will be driving her to appointment.

## 2019-02-14 MED ORDER — ALPRAZOLAM 0.5 MG PO TABS: 0.5000 mg | ORAL_TABLET | Freq: Once | ORAL | 0 refills | Status: DC

## 2019-02-25 ENCOUNTER — Other Ambulatory Visit: Payer: Self-pay

## 2019-02-25 ENCOUNTER — Encounter (HOSPITAL_COMMUNITY)
Admission: RE | Admit: 2019-02-25 | Discharge: 2019-02-25 | Disposition: A | Discharge status: Home / Self Care | Payer: Medicare Other | Source: Ambulatory Visit | Attending: Hematology | Admitting: Hematology

## 2019-02-25 DIAGNOSIS — I700 Atherosclerosis of aorta: Secondary | ICD-10-CM | POA: Insufficient documentation

## 2019-02-25 DIAGNOSIS — I2510 Atherosclerotic heart disease of native coronary artery without angina pectoris: Secondary | ICD-10-CM | POA: Insufficient documentation

## 2019-02-25 DIAGNOSIS — C773 Secondary and unspecified malignant neoplasm of axilla and upper limb lymph nodes: Secondary | ICD-10-CM | POA: Diagnosis not present

## 2019-02-25 DIAGNOSIS — C7802 Secondary malignant neoplasm of left lung: Secondary | ICD-10-CM | POA: Diagnosis not present

## 2019-02-25 DIAGNOSIS — K449 Diaphragmatic hernia without obstruction or gangrene: Secondary | ICD-10-CM | POA: Diagnosis not present

## 2019-02-25 DIAGNOSIS — C50912 Malignant neoplasm of unspecified site of left female breast: Secondary | ICD-10-CM | POA: Insufficient documentation

## 2019-02-25 DIAGNOSIS — C7801 Secondary malignant neoplasm of right lung: Secondary | ICD-10-CM | POA: Diagnosis not present

## 2019-02-25 DIAGNOSIS — J90 Pleural effusion, not elsewhere classified: Secondary | ICD-10-CM | POA: Insufficient documentation

## 2019-02-25 DIAGNOSIS — C778 Secondary and unspecified malignant neoplasm of lymph nodes of multiple regions: Secondary | ICD-10-CM | POA: Diagnosis not present

## 2019-02-25 DIAGNOSIS — C771 Secondary and unspecified malignant neoplasm of intrathoracic lymph nodes: Secondary | ICD-10-CM | POA: Diagnosis not present

## 2019-02-25 LAB — GLUCOSE, CAPILLARY: Glucose-Capillary: 112 mg/dL — ABNORMAL HIGH (ref 70–99)

## 2019-02-25 MED ORDER — FLUDEOXYGLUCOSE F - 18 (FDG) INJECTION
6.4700 | Freq: Once | INTRAVENOUS | Status: AC | PRN
  Administered 2019-02-25: 6.47 via INTRAVENOUS

## 2019-02-28 ENCOUNTER — Inpatient Hospital Stay (HOSPITAL_BASED_OUTPATIENT_CLINIC_OR_DEPARTMENT_OTHER): Payer: Medicare Other | Admitting: Hematology

## 2019-02-28 ENCOUNTER — Inpatient Hospital Stay (HOSPITAL_COMMUNITY): Payer: Medicare Other

## 2019-02-28 ENCOUNTER — Inpatient Hospital Stay (HOSPITAL_COMMUNITY): Payer: Medicare Other | Attending: Hematology

## 2019-02-28 ENCOUNTER — Other Ambulatory Visit: Payer: Self-pay

## 2019-02-28 DIAGNOSIS — R197 Diarrhea, unspecified: Secondary | ICD-10-CM | POA: Diagnosis not present

## 2019-02-28 DIAGNOSIS — E785 Hyperlipidemia, unspecified: Secondary | ICD-10-CM | POA: Insufficient documentation

## 2019-02-28 DIAGNOSIS — Z8249 Family history of ischemic heart disease and other diseases of the circulatory system: Secondary | ICD-10-CM | POA: Diagnosis not present

## 2019-02-28 DIAGNOSIS — Z171 Estrogen receptor negative status [ER-]: Secondary | ICD-10-CM

## 2019-02-28 DIAGNOSIS — Z7982 Long term (current) use of aspirin: Secondary | ICD-10-CM | POA: Diagnosis not present

## 2019-02-28 DIAGNOSIS — Z5112 Encounter for antineoplastic immunotherapy: Secondary | ICD-10-CM | POA: Diagnosis not present

## 2019-02-28 DIAGNOSIS — E7800 Pure hypercholesterolemia, unspecified: Secondary | ICD-10-CM | POA: Diagnosis not present

## 2019-02-28 DIAGNOSIS — Z79899 Other long term (current) drug therapy: Secondary | ICD-10-CM | POA: Insufficient documentation

## 2019-02-28 DIAGNOSIS — C7800 Secondary malignant neoplasm of unspecified lung: Secondary | ICD-10-CM | POA: Diagnosis not present

## 2019-02-28 DIAGNOSIS — C50912 Malignant neoplasm of unspecified site of left female breast: Secondary | ICD-10-CM | POA: Diagnosis not present

## 2019-02-28 DIAGNOSIS — I10 Essential (primary) hypertension: Secondary | ICD-10-CM

## 2019-02-28 LAB — CBC WITH DIFFERENTIAL/PLATELET
Abs Immature Granulocytes: 0.01 10*3/uL (ref 0.00–0.07)
Basophils Absolute: 0 10*3/uL (ref 0.0–0.1)
Basophils Relative: 1 %
Eosinophils Absolute: 0.3 10*3/uL (ref 0.0–0.5)
Eosinophils Relative: 6 %
HCT: 33.8 % — ABNORMAL LOW (ref 36.0–46.0)
Hemoglobin: 10.4 g/dL — ABNORMAL LOW (ref 12.0–15.0)
Immature Granulocytes: 0 %
Lymphocytes Relative: 18 %
Lymphs Abs: 0.8 10*3/uL (ref 0.7–4.0)
MCH: 30.1 pg (ref 26.0–34.0)
MCHC: 30.8 g/dL (ref 30.0–36.0)
MCV: 97.7 fL (ref 80.0–100.0)
Monocytes Absolute: 0.4 10*3/uL (ref 0.1–1.0)
Monocytes Relative: 9 %
Neutro Abs: 2.9 10*3/uL (ref 1.7–7.7)
Neutrophils Relative %: 66 %
Platelets: 211 10*3/uL (ref 150–400)
RBC: 3.46 MIL/uL — ABNORMAL LOW (ref 3.87–5.11)
RDW: 13.3 % (ref 11.5–15.5)
WBC: 4.4 10*3/uL (ref 4.0–10.5)
nRBC: 0 % (ref 0.0–0.2)

## 2019-02-28 LAB — COMPREHENSIVE METABOLIC PANEL
ALT: 16 U/L (ref 0–44)
AST: 19 U/L (ref 15–41)
Albumin: 3.5 g/dL (ref 3.5–5.0)
Alkaline Phosphatase: 53 U/L (ref 38–126)
Anion gap: 10 (ref 5–15)
BUN: 25 mg/dL — ABNORMAL HIGH (ref 8–23)
CO2: 24 mmol/L (ref 22–32)
Calcium: 9 mg/dL (ref 8.9–10.3)
Chloride: 109 mmol/L (ref 98–111)
Creatinine, Ser: 1.21 mg/dL — ABNORMAL HIGH (ref 0.44–1.00)
GFR calc Af Amer: 45 mL/min — ABNORMAL LOW (ref >60)
GFR calc non Af Amer: 39 mL/min — ABNORMAL LOW (ref >60)
Glucose, Bld: 120 mg/dL — ABNORMAL HIGH (ref 70–99)
Potassium: 4.6 mmol/L (ref 3.5–5.1)
Sodium: 143 mmol/L (ref 135–145)
Total Bilirubin: 0.4 mg/dL (ref 0.3–1.2)
Total Protein: 5.8 g/dL — ABNORMAL LOW (ref 6.5–8.1)

## 2019-02-28 MED ORDER — SODIUM CHLORIDE 0.9% FLUSH
10.0000 mL | INTRAVENOUS | Status: DC | PRN
  Administered 2019-02-28: 10 mL
  Filled 2019-02-28: qty 10

## 2019-02-28 MED ORDER — HEPARIN SOD (PORK) LOCK FLUSH 100 UNIT/ML IV SOLN
500.0000 [IU] | Freq: Once | INTRAVENOUS | Status: AC | PRN
  Administered 2019-02-28: 500 [IU]

## 2019-02-28 MED ORDER — ACETAMINOPHEN 325 MG PO TABS
650.0000 mg | ORAL_TABLET | Freq: Once | ORAL | Status: DC
  Filled 2019-02-28: qty 2

## 2019-02-28 MED ORDER — SODIUM CHLORIDE 0.9 % IV SOLN
Freq: Once | INTRAVENOUS | Status: AC
  Administered 2019-02-28: 09:00:00 via INTRAVENOUS

## 2019-02-28 MED ORDER — DIPHENHYDRAMINE HCL 25 MG PO CAPS
25.0000 mg | ORAL_CAPSULE | Freq: Once | ORAL | Status: DC
  Filled 2019-02-28: qty 1

## 2019-02-28 NOTE — Progress Notes (Signed)
DISCONTINUE ON PATHWAY REGIMEN - Breast     A cycle is every 21 days:     Ado-trastuzumab emtansine   **Always confirm dose/schedule in your pharmacy ordering system**  REASON: Disease Progression PRIOR TREATMENT: [id number]: Ado-trastuzumab Emtansine 3.6 mg/kg q21 Days TREATMENT RESPONSE: Progressive Disease (PD)  START ON PATHWAY REGIMEN - Breast     A cycle is every 21 days:     Ado-trastuzumab emtansine   **Always confirm dose/schedule in your pharmacy ordering system**  Patient Characteristics: Distant Metastases or Locoregional Recurrent Disease - Unresected or Locally Advanced Unresectable Disease Progressing after Neoadjuvant and Local Therapies, HER2 Positive, ER Negative/Unknown, Chemotherapy, Second Line Therapeutic Status: Locally Advanced Disease - Unresectable and Progressing after Neoadjuvant and Local Therapies BRCA Mutation Status: Did Not Order Test ER Status: Negative (-) HER2 Status: Positive (+) PR Status: Negative (-) Line of Therapy: Second Line Intent of Therapy: Non-Curative / Palliative Intent, Discussed with Patient

## 2019-02-28 NOTE — Assessment & Plan Note (Addendum)
1.  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  No lymph node sampling was done. - In July 2019, she noticed a lump at her prior lumpectomy site, which was painful.  She did not want to have mammogram done. -Biopsy of this lump on 03/28/2018 by Dr. Arnoldo Morale showed invasive ductal carcinoma with calcifications (2.2 cm), ER negative, PR negative, Ki-67 40%, HER-2 3+ positive by IHC.  3/3 lymph nodes positive. - Left modified radical mastectomy on 05/28/2018.  -PET/CT scan on 07/23/2018 shows few scattered mildly hypermetabolic pulmonary nodules, hypermetabolic left axillary, right paratracheal, right hilar, subcarinal adenopathy compatible with metastatic disease in the chest.  -2D echo on 08/03/2018 shows EF of 65 to 70%.   - 4 cycles of single agent Herceptin every 3 weeks from 08/09/2018 through 10/18/2018. - PET CT scan on 10/30/2018 showed mild progression of pulmonary metastasis.  Stable mediastinal lymph nodes.  2 adjacent hypermetabolic left axillary lymph nodes, previously only 1 lymph node noted.  No other evidence of metastasis in the abdomen or pelvis or skeleton. -Perjeta and Herceptin started on 11/15/2018.  She will continue therapy until disease progression or unacceptable toxicity. -Echocardiogram on 01/02/2019 shows EF of 60 to 65%. - Restaging PET/CT on February 25, 2019 suggests increased size and increased hypermetabolic activity involving bilateral subclavicular lymph nodes, left axillary lymph node, mediastinal and right hilar lymph nodes and multiple bilateral pulmonary metastasis. - Discussed findings with patient, findings are consistent with disease progression. Recommend change therapy to  Kadcyla, second line therapy. Goal of treatment is disease control. Have discussed side effects of Kadcyla and provided patient literature for patient to review. Echo is with in the last three months with EF of 60-65%. - We will plan to start treatment next week.

## 2019-02-28 NOTE — Progress Notes (Signed)
0930 Pt will not receive tx today. She will be rescheduled to start Kadcyla only after authorization per RNester NP who reviewed this new medication in detail with the pt as well        [employer] discharged self ambulatory in satisfactory condition

## 2019-02-28 NOTE — Patient Instructions (Signed)
[facility] at [facility] Discharge Instructions  Labs drawn from portacath today   Thank you for choosing [facility] at [facility] to provide your oncology and hematology care.  To afford each patient quality time with our provider, please arrive at least 15 minutes before your scheduled appointment time.   If you have a lab appointment with the Cancer Center please come in thru the  Main Entrance and check in at the main information desk  You need to re-schedule your appointment should you arrive 10 or more minutes late.  We strive to give you quality time with our providers, and arriving late affects you and other patients whose appointments are after yours.  Also, if you no show three or more times for appointments you may be dismissed from the clinic at the providers discretion.     Again, thank you for choosing [facility].  Our hope is that these requests will decrease the amount of time that you wait before being seen by our physicians.       _____________________________________________________________  Should you have questions after your visit to [facility], please contact our office at [phone] between the hours of 8:00 a.m. and 4:30 p.m.  Voicemails left after 4:00 p.m. will not be returned until the following business day.  For prescription refill requests, have your pharmacy contact our office and allow 72 hours.    Cancer Center Support Programs:   > Cancer Support Group  2nd Tuesday of the month 1pm-2pm, Journey Room   

## 2019-02-28 NOTE — Patient Instructions (Signed)
[facility] Discharge Instructions for Patients Receiving Chemotherapy   Beginning January 23rd 2017 lab work for the [facility] will be done in the  Main lab at [facility] on 1st floor. If you have a lab appointment with the [facility] please come in thru the  Main Entrance and check in at the main information desk   Today you received the following chemotherapy agents Herceptin and Perjeya. Follow-up as scheduled. Call clinic for any questions or concerns  To help prevent nausea and vomiting after your treatment, we encourage you to take your nausea medication   If you develop nausea and vomiting, or diarrhea that is not controlled by your medication, call the clinic.  The clinic phone number is [phone]. Office hours are Monday-Friday 8:30am-5:00pm.  BELOW ARE SYMPTOMS THAT SHOULD BE REPORTED IMMEDIATELY:  *FEVER GREATER THAN 101.0 F  *CHILLS WITH OR WITHOUT FEVER  NAUSEA AND VOMITING THAT IS NOT CONTROLLED WITH YOUR NAUSEA MEDICATION  *UNUSUAL SHORTNESS OF BREATH  *UNUSUAL BRUISING OR BLEEDING  TENDERNESS IN MOUTH AND THROAT WITH OR WITHOUT PRESENCE OF ULCERS  *URINARY PROBLEMS  *BOWEL PROBLEMS  UNUSUAL RASH Items with * indicate a potential emergency and should be followed up as soon as possible. If you have an emergency after office hours please contact your primary care physician or go to the nearest emergency department.  Please call the clinic during office hours if you have any questions or concerns.   You may also contact the Patient Navigator at [phone] should you have any questions or need assistance in obtaining follow up care.      Resources For Cancer Patients and their Caregivers ? American Cancer Society: Can assist with transportation, wigs, general needs, runs Look Good Feel Better.        678-140-1061 ? Cancer Care: Provides financial assistance, online support groups, medication/co-pay assistance.   1-800-813-HOPE 575-494-7757) ? [place] Assists [place] cancer patients and their families through emotional , educational and financial support.  872-254-3775 ? [place] DSS Where to apply for food stamps, Medicaid and utility assistance. (912)395-6076 ? [employer]: Transportation to medical appointments. 709-127-9791 ? Social Security Administration: May apply for disability if have a Stage IV cancer. (667)424-1096 (469) 620-3585 ? [employer], Disability and Transit Services: Assists with nutrition, care and transit needs. [phone]

## 2019-02-28 NOTE — Progress Notes (Signed)
[address] [place], [place] [address]   CLINIC:  Medical Oncology/Hematology  PCP:  [name], MD [place] [phone]   REASON FOR VISIT:  Follow-up for Breast Cancer   CURRENT THERAPY: Herceptin/Projecta  BRIEF ONCOLOGIC HISTORY:  Oncology History  Invasive ductal carcinoma of left breast ([place])  03/30/2017 Initial Diagnosis   Invasive ductal carcinoma of left breast ([place])   08/09/2018 - 03/20/2019 Chemotherapy   The patient had trastuzumab (HERCEPTIN) 500 mg in sodium chloride 0.9 % 250 mL chemo infusion, 525 mg, Intravenous,  Once, 10 of 10 cycles Administration: 500 mg (08/09/2018), 350 mg (09/06/2018), 350 mg (09/27/2018), 378 mg (10/18/2018), 378 mg (11/15/2018), 378 mg (12/06/2018), 378 mg (12/28/2018), 378 mg (01/18/2019), 378 mg (02/08/2019) pertuzumab (PERJETA) 840 mg in sodium chloride 0.9 % 250 mL chemo infusion, 840 mg (100 % of original dose 840 mg), Intravenous, Once, 6 of 6 cycles Dose modification: 840 mg (original dose 840 mg, Cycle 5), 420 mg (original dose 420 mg, Cycle 6) Administration: 840 mg (11/15/2018), 420 mg (12/06/2018), 420 mg (12/28/2018), 420 mg (01/18/2019), 420 mg (02/08/2019)  for chemotherapy treatment.    09/18/2018 Genetic Testing   Negative genetic testing for the breast and gyn cancer panel.  The Breast/GYN gene panel offered by [facility] includes sequencing and rearrangement analysis for the following 23 genes:  ATM, BRCA1, BRCA2, BRIP1, CDH1, CHEK2, EPCAM, MLH1, MSH2, MSH6, NBN, NF1, PALB2, PMS2, PTEN, RAD51C, RAD51D, STK11, and TP53.   The report date is 09/18/2018.   03/07/2019 -  Chemotherapy   The patient had ado-trastuzumab emtansine (KADCYLA) 220 mg in sodium chloride 0.9 % 250 mL chemo infusion, 3.6 mg/kg, Intravenous, Once, 0 of 5 cycles  for chemotherapy treatment.       CANCER STAGING: Cancer Staging No matching staging information was found for the patient.   INTERVAL  HISTORY:  Ms. Carachure 85 y.o. female presents today for follow up.  Reports overall doing well.  Denies any significant fatigue.  She does report pain in her left side that started last night and has since resolved.  She denies any known injury.  She denies any constipation, diarrhea, nausea or vomiting.  Reports appetite is stable.  She remains physically active.  She presents today for treatment and also to discuss results of recent PET scan.   REVIEW OF SYSTEMS:  Review of Systems  All other systems reviewed and are negative.    PAST MEDICAL/SURGICAL HISTORY:  Past Medical History:  Diagnosis Date  . Cancer ([place])    left breast  . History of kidney stones   . Hypercholesteremia   . Hypertension    Past Surgical History:  Procedure Laterality Date  . APPENDECTOMY    . brain cyst removed  2011  . BREAST BIOPSY Left 03/28/2018   Procedure: BREAST BIOPSY;  Surgeon: [name], MD;  Location: [facility];  Service: General;  Laterality: Left;  . CATARACT EXTRACTION W/PHACO Left 11/09/2015   Procedure: CATARACT EXTRACTION PHACO AND INTRAOCULAR LENS PLACEMENT LEFT EYE cde=8.97;  Surgeon: [name], MD;  Location: [facility];  Service: Ophthalmology;  Laterality: Left;  . CATARACT EXTRACTION W/PHACO Right 02/04/2019   Procedure: CATARACT EXTRACTION PHACO AND INTRAOCULAR LENS PLACEMENT (IOC);  Surgeon: [name], MD;  Location: [facility];  Service: Ophthalmology;  Laterality: Right;  CDE: 15.19  . EYE SURGERY     KPE left  . MASTECTOMY MODIFIED RADICAL Left 05/28/2018   Procedure: LEFT MODIFIED RADICAL  MASTECTOMY;  Surgeon: [name], MD;  Location: [facility];  Service: General;  Laterality: Left;  Marland Kitchen MASTECTOMY, PARTIAL Left 04/24/2017   Procedure: MASTECTOMY PARTIAL;  Surgeon: [name], MD;  Location: [facility];  Service: General;  Laterality: Left;  . PORTACATH PLACEMENT Right 08/08/2018   Procedure: INSERTION PORT-A-CATH;  Surgeon: [name], MD;  Location: [facility];  Service: General;   Laterality: Right;  . TONSILLECTOMY    . TONSILLECTOMY AND ADENOIDECTOMY       SOCIAL HISTORY:  Social History   Socioeconomic History  . Marital status: Widowed    Spouse name: Not on file  . Number of children: Not on file  . Years of education: Not on file  . Highest education level: Not on file  Occupational History  . Not on file  Social Needs  . Financial resource strain: Not hard at all  . Food insecurity    Worry: Never true    Inability: Never true  . Transportation needs    Medical: No    Non-medical: No  Tobacco Use  . Smoking status: Never Smoker  . Smokeless tobacco: Never Used  Substance and Sexual Activity  . Alcohol use: No  . Drug use: No  . Sexual activity: Not Currently    Partners: Male  Lifestyle  . Physical activity    Days per week: Patient refused    Minutes per session: Patient refused  . Stress: Not at all  Relationships  . Social Herbalist on phone: Patient refused    Gets together: Patient refused    Attends religious service: Patient refused    Active member of club or organization: Patient refused    Attends meetings of clubs or organizations: Patient refused    Relationship status: Patient refused  . Intimate partner violence    Fear of current or ex partner: Patient refused    Emotionally abused: Patient refused    Physically abused: Patient refused    Forced sexual activity: Patient refused  Other Topics Concern  . Not on file  Social History Narrative  . Not on file    FAMILY HISTORY:  Family History  Problem Relation Age of Onset  . Heart failure Mother   . Heart failure Father   . Congestive Heart Failure Brother   . Tuberculosis Paternal Uncle   . Tuberculosis Maternal Grandmother   . Tuberculosis Maternal Grandfather   . Congestive Heart Failure Brother     CURRENT MEDICATIONS:  Outpatient Encounter Medications as of 02/28/2019  Medication Sig  . amLODipine (NORVASC) 5 MG tablet Take 5 mg by mouth  daily.  Marland Kitchen aspirin EC 81 MG tablet Take 81 mg by mouth 2 (two) times a day.  . ferrous sulfate 325 (65 FE) MG tablet Take 325 mg by mouth daily with breakfast.  . labetalol (NORMODYNE) 100 MG tablet Take 1 tablet (100 mg total) by mouth 2 (two) times daily. (Patient taking differently: Take 100 mg by mouth daily. )  . lidocaine-prilocaine (EMLA) cream Apply 1 application topically as needed (port access).  . naproxen sodium (ALEVE) 220 MG tablet Take 220 mg by mouth daily as needed (for headache or pain).  Marland Kitchen oxybutynin (DITROPAN) 5 MG tablet Take 5 mg by mouth daily.   . simvastatin (ZOCOR) 40 MG tablet Take 40 mg by mouth daily.  . Trastuzumab (HERCEPTIN IV) Inject 525 mg into the vein every 21 ( twenty-one) days.  . [DISCONTINUED] prochlorperazine (COMPAZINE) 10 MG tablet Take 1  tablet (10 mg total) by mouth every 6 (six) hours as needed (Nausea or vomiting). (Patient taking differently: Take 10 mg by mouth daily. )  . ALPRAZolam (XANAX) 0.5 MG tablet TAKE 1 TABLET BY MOUTH AS A ONE TIME DOSE 30 MINUTES BEFORE YOU ARRIVE FOR YOUR SCAN.  Marland Kitchen LORazepam (ATIVAN) 1 MG tablet Take 1 mg by mouth daily.    No facility-administered encounter medications on file as of 02/28/2019.     ALLERGIES:  No Known Allergies   PHYSICAL EXAM:  ECOG Performance status: 1  Vitals:   02/28/19 0806  BP: 138/62  Pulse: (!) 52  Resp: 18  Temp: (!) 97.3 F (36.3 C)  SpO2: 97%   Filed Weights   02/28/19 0806  Weight: 130 lb 12.8 oz (59.3 kg)    Physical Exam Constitutional:      Appearance: Normal appearance.  HENT:     Head: Normocephalic.     Nose: Nose normal.  Eyes:     Conjunctiva/sclera: Conjunctivae normal.  Neck:     Musculoskeletal: Normal range of motion.  Cardiovascular:     Rate and Rhythm: Normal rate and regular rhythm.     Pulses: Normal pulses.     Heart sounds: Normal heart sounds.  Pulmonary:     Effort: Pulmonary effort is normal.     Breath sounds: Normal breath sounds.   Abdominal:     General: Bowel sounds are normal.  Musculoskeletal: Normal range of motion.  Skin:    General: Skin is warm and dry.  Neurological:     General: No focal deficit present.     Mental Status: She is alert and oriented to person, place, and time.  Psychiatric:        Mood and Affect: Mood normal.        Behavior: Behavior normal.        Thought Content: Thought content normal.        Judgment: Judgment normal.      LABORATORY DATA:  I have reviewed the labs as listed.  CBC    Component Value Date/Time   WBC 4.4 02/28/2019 0754   RBC 3.46 (L) 02/28/2019 0754   HGB 10.4 (L) 02/28/2019 0754   HCT 33.8 (L) 02/28/2019 0754   PLT 211 02/28/2019 0754   MCV 97.7 02/28/2019 0754   MCH 30.1 02/28/2019 0754   MCHC 30.8 02/28/2019 0754   RDW 13.3 02/28/2019 0754   LYMPHSABS 0.8 02/28/2019 0754   MONOABS 0.4 02/28/2019 0754   EOSABS 0.3 02/28/2019 0754   BASOSABS 0.0 02/28/2019 0754   CMP Latest Ref Rng & Units 02/28/2019 02/08/2019 01/18/2019  Glucose 70 - 99 mg/dL 120(H) 136(H) 115(H)  BUN 8 - 23 mg/dL 25(H) 23 27(H)  Creatinine 0.44 - 1.00 mg/dL 1.21(H) 1.22(H) 1.21(H)  Sodium 135 - 145 mmol/L 143 141 139  Potassium 3.5 - 5.1 mmol/L 4.6 4.3 4.1  Chloride 98 - 111 mmol/L 109 109 108  CO2 22 - 32 mmol/L _0 Calcium 8.9 - 10.3 mg/dL 9.0 8.6(L) 8.7(L)  Total Protein 6.5 - 8.1 g/dL 5.8(L) 5.7(L) 5.6(L)  Total Bilirubin 0.3 - 1.2 mg/dL 0.4 0.6 0.5  Alkaline Phos 38 - 126 U/L 53 54 53  AST 15 - 41 U/L _1 ALT 0 - 44 U/L _2 ASSESSMENT & PLAN:   Invasive ductal carcinoma of left breast (HCC) 1.  Metastatic HER-2 positive left breast cancer: - Initial biopsy of  the left breast lesion on 03/14/2017, ER 20% positive, PR negative, HER-2 negative by FISH, Ki-67 20%, status post lumpectomy on 04/24/2017 with pathology showing 4.2 cm invasive ductal carcinoma, grade 3, invasive carcinoma focally 0.1 cm from the posterior and anterior margins.  No lymph  node sampling was done. - In July 2019, she noticed a lump at her prior lumpectomy site, which was painful.  She did not want to have mammogram done. -Biopsy of this lump on 03/28/2018 by Dr. Arnoldo Morale showed invasive ductal carcinoma with calcifications (2.2 cm), ER negative, PR negative, Ki-67 40%, HER-2 3+ positive by IHC.  3/3 lymph nodes positive. - Left modified radical mastectomy on 05/28/2018.  -PET/CT scan on 07/23/2018 shows few scattered mildly hypermetabolic pulmonary nodules, hypermetabolic left axillary, right paratracheal, right hilar, subcarinal adenopathy compatible with metastatic disease in the chest.  -2D echo on 08/03/2018 shows EF of 65 to 70%.   - 4 cycles of single agent Herceptin every 3 weeks from 08/09/2018 through 10/18/2018. - PET CT scan on 10/30/2018 showed mild progression of pulmonary metastasis.  Stable mediastinal lymph nodes.  2 adjacent hypermetabolic left axillary lymph nodes, previously only 1 lymph node noted.  No other evidence of metastasis in the abdomen or pelvis or skeleton. -Perjeta and Herceptin started on 11/15/2018.  She will continue therapy until disease progression or unacceptable toxicity. -Echocardiogram on 01/02/2019 shows EF of 60 to 65%. - Restaging PET/CT on February 25, 2019 suggests increased size and increased hypermetabolic activity involving bilateral subclavicular lymph nodes, left axillary lymph node, mediastinal and right hilar lymph nodes and multiple bilateral pulmonary metastasis. - Discussed findings with patient, findings are consistent with disease progression. Recommend change therapy to Kadcyla, second line therapy. Goal of treatment is disease control. Have discussed side effects of Kadcyla and provided patient literature for patient to review. Echo is with in the last three months with EF of 60-65%. - We will plan to start treatment next week.           Orders placed this encounter:  Orders Placed This Encounter  Procedures  .  Cancer antigen 27.29  . Cancer antigen 15-3  . CBC with Differential  . Comprehensive metabolic panel     Roger Shelter, [place] [phone]

## 2019-02-28 NOTE — Progress Notes (Signed)
DISCONTINUE OFF PATHWAY REGIMEN - Breast   [id number]:Trastuzumab (Maintenance - w/Loading Dose):   A cycle is every 21 days:     Trastuzumab-xxxx      Trastuzumab-xxxx   **Always confirm dose/schedule in your pharmacy ordering system**  REASON: Disease Progression PRIOR TREATMENT: Off Pathway: Trastuzumab (Maintenance - w/Loading Dose) TREATMENT RESPONSE: Progressive Disease (PD)  START ON PATHWAY REGIMEN - Breast     A cycle is every 21 days:     Ado-trastuzumab emtansine   **Always confirm dose/schedule in your pharmacy ordering system**  Patient Characteristics: Distant Metastases or Locoregional Recurrent Disease - Unresected or Locally Advanced Unresectable Disease Progressing after Neoadjuvant and Local Therapies, HER2 Positive, ER Negative/Unknown, Chemotherapy, Second Line Therapeutic Status: Locally Advanced Disease - Unresectable and Progressing after Neoadjuvant and Local Therapies BRCA Mutation Status: Did Not Order Test ER Status: Negative (-) HER2 Status: Positive (+) PR Status: Negative (-) Line of Therapy: Second Line Intent of Therapy: Non-Curative / Palliative Intent, Discussed with Patient

## 2019-03-11 ENCOUNTER — Other Ambulatory Visit: Payer: Self-pay

## 2019-03-11 ENCOUNTER — Inpatient Hospital Stay (HOSPITAL_COMMUNITY): Payer: Medicare Other

## 2019-03-11 NOTE — Progress Notes (Signed)
Kadcyla teaching packet pulled together. Patient will be taught at bedside tomorrow with treatment.

## 2019-03-11 NOTE — Patient Instructions (Signed)
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  You are being treated with palliative intent, which means that we are not able to cure your cancer but we are able to control it.  You will begin treatment today with Kadcyla (Ado-trastuzumab emtadsine).  This will be given every 21 days.  You will see the doctor regularly throughout treatment.  We monitor your lab work prior to every treatment. The doctor monitors your response to treatment by the way you are feeling, your blood work, and scans periodically.  There will be wait times while you are here for treatment.  It will take about 30 minutes to 1 hour for your lab work to result.  Then there will be wait times while pharmacy mixes your medications.   You will be given the following premedications prior to each treatment: Tylenol: given to prevent allergic reaction to medication Benadryl: given to prevent allergic reaction to medication  Ado-trastuzumab emtansine (Kadcyla)  About This Drug Ado-trastuzumab emtansine is used to treat cancer. It is given in the vein (IV).  Possible Side Effects . Decrease in the number of platelets. This may raise your risk of bleeding. . Nausea . Constipation (not able to move bowels) . Abnormal bleeding . Headache . Changes in your liver function . Nosebleed . Tiredness . Joint, muscle and/or bone pain . Effects on the nerves are called peripheral neuropathy. You may feel numbness, tingling, or pain in your hands and feet. It may be hard for you to button your clothes, open jars, or walk as usual. The effect on the nerves may get worse with more doses of the drug. These effects get better in some people after the drug is stopped but it does not get better in all people.  Note: Each of the side effects above was reported in 25% or greater of patients treated with adotrastuzumab emtansine. Not all possible side effects are  included above.  Warnings and Precautions . Scarring and/or inflammation (swelling) of the lungs, which can be life-threatening. You may have a dry cough or trouble breathing. . While you are getting this drug in your vein (IV), you may have a reaction to the drug. Sometimes you may be given medication to stop or lessen these side effects. Your nurse will check you closely for these signs: fever or shaking chills, flushing, facial swelling, feeling dizzy, headache, trouble breathing, rash, itching, chest tightness, or chest pain. These reactions may happen after your infusion. If this happens, call 911 for emergency care. . Skin and tissue irritation including redness, pain, warmth, or swelling at the IV site. This happens if the drug leaks out of the vein and into nearby tissue. . Severe changes in your liver function, which can cause liver failure and be life-threatening. . Changes in your heart function . Abnormal bleeding, which can be life-threatening- symptoms may be coughing up blood, throwing up blood (may look like coffee grounds), red or black tarry bowel movements, abnormally heavy menstrual flow, nosebleeds or any other unusual bleeding. . Severe decrease in the number of platelets . Severe peripheral neuropathy.  Note: Some of the side effects above are very rare. If you have concerns and/or questions, please discuss them with your medical team.  Important Information . This drug may be present in the saliva, tears, sweat, urine, stool, vomit, semen, and vaginal secretions. Talk to your doctor and/or your nurse about the necessary precautions to take during this  time.  Treating Side Effects . Manage tiredness by pacing your activities for the day. . Be sure to include periods of rest between energy-draining activities. . To decrease the risk of bleeding, use a soft toothbrush. Check with your nurse before using dental floss. . Be very careful when using knives or  tools. . Use an electric shaver instead of a razor. . Ask your doctor or nurse about medicines that are available to help stop or lessen constipation. . If you are not able to move your bowels, check with your doctor or nurse before you use enemas, laxatives, or suppositories. . Drink plenty of fluids (a minimum of eight glasses per day is recommended). . If you throw up or have loose bowel movements, you should drink more fluids so that you do not become dehydrated (lack of water in the body from losing too much fluid). . To help with nausea and vomiting, eat small, frequent meals instead of three large meals a day. Choose foods and drinks that are at room temperature. Ask your nurse or doctor about other helpful tips and medicine that is available to help stop or lessen these symptoms. . Infusion reactions may happen after your infusion. If this happens, call 911 for emergency care. Marland Kitchen Keeping your pain under control is important to your well-being. Please tell your doctor or nurse if you are experiencing pain. . If you have numbness and tingling in your hands and feet, be careful when cooking, walking, and handling sharp objects and hot liquids. . If you have a nose bleed, sit with your head tipped slightly forward. Apply pressure by lightly pinching the bridge of your nose between your thumb and forefinger. Call your doctor if you feel dizzy or faint or if the bleeding doesn't stop after 10 to 15 minutes.  Food and Drug Interactions . There are no known interactions of ado-trastuzumab emtansine with food. . This drug may interact with other medicines. Tell your doctor and pharmacist about all the prescription and over-the-counter medicines and dietary supplements (vitamins, minerals, herbs and others) that you are taking at this time. Also, check with your doctor or pharmacist before starting any new prescription or over-the-counter medicines, or dietary supplements to make sure that  there are no interactions.  When to Call the Doctor Call your doctor or nurse if you have any of these symptoms and/or any new or unusual symptoms: . Fever of 100.4 F (38 C) or higher . Chills . Headache that does not go away . Tiredness that interferes with your daily activities . Pain in your chest . Dry cough . Wheezing or trouble breathing . Nosebleed that doesn't stop bleeding after 10 -15 minutes . Feeling dizzy or lightheaded . Easy bleeding or bruising . Blood in your urine, vomit (bright red or coffee-ground) and/or stools ( bright red, or black/tarry) . Coughing up blood . Nausea that stops you from eating or drinking and/or is not relieved by prescribed medicines . Throwing up more than 3 times a day . No bowel movement in 3 days or when you feel uncomfortable. . Signs of infusion reaction: fever or shaking chills, flushing, facial swelling, feeling dizzy, headache, trouble breathing, rash, itching, chest tightness, or chest pain. If this happens, call 911 for emergency care. . Swelling of legs, ankles, or feet . Weight gain of 5 pounds in one week (fluid retention) . Pain that does not go away, or is not relieved by prescribed medicines . Numbness, tingling, or pain in your  hands and feet . Signs of possible liver problems: dark urine, pale bowel movements, bad stomach pain, feeling very tired and weak, unusual itching, or yellowing of the eyes or skin . While you are getting this drug, please tell your nurse right away if you have any pain, redness, or swelling at the site of the IV infusion. . If you think you may be pregnant or may have impregnated your partner   SELF CARE ACTIVITIES WHILE ON CHEMOTHERAPY:  Hydration Increase your fluid intake 48 hours prior to treatment and drink at least 8 to 12 cups (64 ounces) of water/decaffeinated beverages per day after treatment. You can still have your cup of coffee or soda but these beverages do not count as part of your  8 to 12 cups that you need to drink daily. No alcohol intake.  Medications Continue taking your normal prescription medication as prescribed.  If you start any new herbal or new supplements please let us know first to make sure it is safe.  Mouth Care Have teeth cleaned professionally before starting treatment. Keep dentures and partial plates clean. Use soft toothbrush and do not use mouthwashes that contain alcohol. Biotene is a good mouthwash that is available at most pharmacies or may be ordered by calling [phone]. Use warm salt water gargles (1 teaspoon salt per 1 quart warm water) before and after meals and at bedtime. Or you may rinse with 2 tablespoons of three-percent hydrogen peroxide mixed in eight ounces of water. If you are still having problems with your mouth or sores in your mouth please call the clinic. If you need dental work, please let the doctor know before you go for your appointment so that we can coordinate the best possible time for you in regards to your chemo regimen. You need to also let your dentist know that you are actively taking chemo. We may need to do labs prior to your dental appointment.  Skin Care Always use sunscreen that has not expired and with SPF (Sun Protection Factor) of 50 or higher. Wear hats to protect your head from the sun. Remember to use sunscreen on your hands, ears, face, & feet.  Use good moisturizing lotions such as udder cream, eucerin, or even Vaseline. Some chemotherapies can cause dry skin, color changes in your skin and nails.    . Avoid long, hot showers or baths. . Use gentle, fragrance-free soaps and laundry detergent. . Use moisturizers, preferably creams or ointments rather than lotions because the thicker consistency is better at preventing skin dehydration. Apply the cream or ointment within 15 minutes of showering. Reapply moisturizer at night, and moisturize your hands every time after you wash them.  Hair Loss (if your  doctor says your hair will fall out)  . If your doctor says that your hair is likely to fall out, decide before you begin chemo whether you want to wear a wig. You may want to shop before treatment to match your hair color. . Hats, turbans, and scarves can also camouflage hair loss, although some people prefer to leave their heads uncovered. If you go bare-headed outdoors, be sure to use sunscreen on your scalp. . Cut your hair short. It eases the inconvenience of shedding lots of hair, but it also can reduce the emotional impact of watching your hair fall out. . Don't perm or color your hair during chemotherapy. Those chemical treatments are already damaging to hair and can enhance hair loss. Once your chemo treatments are done  and your hair has grown back, it's OK to resume dyeing or perming hair. With chemotherapy, hair loss is almost always temporary. But when it grows back, it may be a different color or texture. In older adults who still had hair color before chemotherapy, the new growth may be completely gray.  Often, new hair is very fine and soft.  Infection Prevention Please wash your hands for at least 30 seconds using warm soapy water. Handwashing is the #1 way to prevent the spread of germs. Stay away from sick people or people who are getting over a cold. If you develop respiratory systems such as green/yellow mucus production or productive cough or persistent cough let us know and we will see if you need an antibiotic. It is a good idea to keep a pair of gloves on when going into grocery stores/[employer] to decrease your risk of coming into contact with germs on the carts, etc. Carry alcohol hand gel with you at all times and use it frequently if out in public. If your temperature reaches 100.5 or higher please call the clinic and let us know.  If it is after hours or on the weekend please go to the ER if your temperature is over 100.5.  Please have your own personal thermometer at home to use.     Sex and bodily fluids If you are going to have sex, a condom must be used to protect the person that isn't taking chemotherapy. Chemo can decrease your libido (sex drive). For a few days after chemotherapy, chemotherapy can be excreted through your bodily fluids.  When using the toilet please close the lid and flush the toilet twice.  Do this for a few day after you have had chemotherapy.   Effects of chemotherapy on your sex life Some changes are simple and won't last long. They won't affect your sex life permanently. Sometimes you may feel: . too tired . not strong enough to be very active . sick or sore  . not in the mood . anxious or low Your anxiety might not seem related to sex. For example, you may be worried about the cancer and how your treatment is going. Or you may be worried about money, or about how you family are coping with your illness. These things can cause stress, which can affect your interest in sex. It's important to talk to your partner about how you feel. Remember - the changes to your sex life don't usually last long. There's usually no medical reason to stop having sex during chemo. The drugs won't have any long term physical effects on your performance or enjoyment of sex. Cancer can't be passed on to your partner during sex  Contraception It's important to use reliable contraception during treatment. Avoid getting pregnant while you or your partner are having chemotherapy. This is because the drugs may harm the baby. Sometimes chemotherapy drugs can leave a man or woman infertile.  This means you would not be able to have children in the future. You might want to talk to someone about permanent infertility. It can be very difficult to learn that you may no longer be able to have children. Some people find counselling helpful. There might be ways to preserve your fertility, although this is easier for men than for women. You may want to speak to a fertility expert.  You can talk about sperm banking or harvesting your eggs. You can also ask about other fertility options, such as donor eggs. If  you have or have had breast cancer, your doctor might advise you not to take the contraceptive pill. This is because the hormones in it might affect the cancer.  It is not known for sure whether or not chemotherapy drugs can be passed on through semen or secretions from the vagina. Because of this some doctors advise people to use a barrier method if you have sex during treatment. This applies to vaginal, anal or oral sex. Generally, doctors advise a barrier method only for the time you are actually having the treatment and for about a week after your treatment. Advice like this can be worrying, but this does not mean that you have to avoid being intimate with your partner. You can still have close contact with your partner and continue to enjoy sex.  Animals If you have cats or birds we just ask that you not change the litter or change the cage.  Please have someone else do this for you while you are on chemotherapy.   Food Safety During and After Cancer Treatment Food safety is important for people both during and after cancer treatment. Cancer and cancer treatments, such as chemotherapy, radiation therapy, and stem cell/bone marrow transplantation, often weaken the immune system. This makes it harder for your body to protect itself from foodborne illness, also called food poisoning. Foodborne illness is caused by eating food that contains harmful bacteria, parasites, or viruses.  Foods to avoid Some foods have a higher risk of becoming tainted with bacteria. These include: Marland Kitchen Unwashed fresh fruit and vegetables, especially leafy vegetables that can hide dirt and other contaminants . Raw sprouts, such as alfalfa sprouts . Raw or undercooked beef, especially ground beef, or other raw or undercooked meat and poultry . Fatty, fried, or spicy foods immediately before or after  treatment.  These can sit heavy on your stomach and make you feel nauseous. . Raw or undercooked shellfish, such as oysters. . Sushi and sashimi, which often contain raw fish.  . Unpasteurized beverages, such as unpasteurized fruit juices, raw milk, raw yogurt, or cider . Undercooked eggs, such as soft boiled, over easy, and poached; raw, unpasteurized eggs; or foods made with raw egg, such as homemade raw cookie dough and homemade mayonnaise Simple steps for food safety Shop smart. . Do not buy food stored or displayed in an unclean area. . Do not buy bruised or damaged fruits or vegetables. . Do not buy cans that have cracks, dents, or bulges. . Pick up foods that can spoil at the end of your shopping trip and store them in a cooler on the way home. Prepare and clean up foods carefully. . Rinse all fresh fruits and vegetables under running water, and dry them with a clean towel or paper towel. . Clean the top of cans before opening them. . After preparing food, wash your hands for 20 seconds with hot water and soap. Pay special attention to areas between fingers and under nails. . Clean your utensils and dishes with hot water and soap. Marland Kitchen Disinfect your kitchen and cutting boards using 1 teaspoon of liquid, unscented bleach mixed into 1 quart of water.   Dispose of old food. . Eat canned and packaged food before its expiration date (the "use by" or "best before" date). . Consume refrigerated leftovers within 3 to 4 days. After that time, throw out the food. Even if the food does not smell or look spoiled, it still may be unsafe. Some bacteria, such as Listeria, can  grow even on foods stored in the refrigerator if they are kept for too long. Take precautions when eating out. . At restaurants, avoid buffets and salad bars where food sits out for a long time and comes in contact with many people. Food can become contaminated when someone with a virus, often a norovirus, or another "bug" handles  it. . Put any leftover food in a "to-go" container yourself, rather than having the server do it. And, refrigerate leftovers as soon as you get home. . Choose restaurants that are clean and that are willing to prepare your food as you order it cooked.   MEDICATIONS:                                                                                                                                                                Compazine/Prochlorperazine 71m tablet. Take 1 tablet every 6 hours as needed for nausea/vomiting. (This can make you sleepy)   EMLA cream. Apply a quarter size amount to port site 1 hour prior to chemo. Do not rub in. Cover with plastic wrap.   Over-the-Counter Meds:  Colace - 100 mg capsules - take 2 capsules daily.  If this doesn't help then you can increase to 2 capsules twice daily.  Call [place]if this does not help your bowels move.   Imodium 246mcapsule. Take 2 capsules after the 1st loose stool and then 1 capsule every 2 hours until you go a total of 12 hours without having a loose stool. Call the Ca[place] Cityf loose stools continue. If diarrhea occurs at bedtime, take 2 capsules at bedtime. Then take 2 capsules every 4 hours until morning. Call [place]   Diarrhea Sheet   If you are having loose stools/diarrhea, please purchase Imodium and begin taking as outlined:  At the first sign of poorly formed or loose stools you should begin taking Imodium (loperamide) 2 mg capsules.  Take two caplets (50m1mfollowed by one caplet (2mg35mvery 2 hours until you have had no diarrhea for 12 hours.  During the night take two caplets (50mg)750m bedtime and continue every 4 hours during the night until the morning.  Stop taking Imodium only after there is no sign of diarrhea for 12 hours.    Always call the [place] are having loose stools/diarrhea that you can't get under control.  Loose stools/diarrhea leads to dehydration (loss of water) in your body.  We  have other options of trying to get the loose stools/diarrhea to stop but you must let us knKorea!   Constipation Sheet  Colace - 100 mg capsules - take 2 capsules daily.  If this doesn't help then you can increase to 2 capsules twice daily.  Please call if the  above does not work for you.   Do not go more than 2 days without a bowel movement.  It is very important that you do not become constipated.  It will make you feel sick to your stomach (nausea) and can cause abdominal pain and vomiting.   Nausea Sheet   Compazine/Prochlorperazine 23m tablet. Take 1 tablet every 6 hours as needed for nausea/vomiting. (This can make you sleepy)  If you are having persistent nausea (nausea that does not stop) please call the [place] let [place]now the amount of nausea that you are experiencing.  If you begin to vomit, you need to call the [place] if it is the weekend and you have vomited more than one time and can't get it to stop-go to the Emergency Room.  Persistent nausea/vomiting can lead to dehydration (loss of fluid in your body) and will make you feel terrible.   Ice chips, sips of clear liquids, foods that are @ room temperature, crackers, and toast tend to be better tolerated.   SYMPTOMS TO REPORT AS SOON AS POSSIBLE AFTER TREATMENT:   FEVER GREATER THAN 100.5 F  CHILLS WITH OR WITHOUT FEVER  NAUSEA AND VOMITING THAT IS NOT CONTROLLED WITH YOUR NAUSEA MEDICATION  UNUSUAL SHORTNESS OF BREATH  UNUSUAL BRUISING OR BLEEDING  TENDERNESS IN MOUTH AND THROAT WITH OR WITHOUT PRESENCE OF ULCERS  URINARY PROBLEMS  BOWEL PROBLEMS  UNUSUAL RASH      Wear comfortable clothing and clothing appropriate for easy access to any Portacath or PICC line. Let u[place]aknow if there is anything that we can do to make your therapy better!    What to do if you need assistance after hours or on the weekends: CALL [phone]  HOLD on the line, do not hang up.  You will hear multiple  messages but at the end you will be connected with a nurse triage line.  They will contact the doctor if necessary.  Most of the time they will be able to assist you.  Do not call the hospital operator.      I have been informed and understand all of the instructions given to me and have received a copy. I have been instructed to call the clinic ([phone] my family physician as soon as possible for continued medical care, if indicated. I do not have any more questions at this time but understand that I may call the CElk [place]or the Patient Navigator at ([phone] office hours should I have questions or need assistance in obtaining follow-up care.

## 2019-03-11 NOTE — Addendum Note (Signed)
Addended by: Donetta Potts on: 03/11/2019 04:47 PM   Modules accepted: Orders

## 2019-03-12 ENCOUNTER — Inpatient Hospital Stay (HOSPITAL_COMMUNITY): Payer: Medicare Other

## 2019-03-12 ENCOUNTER — Encounter (HOSPITAL_COMMUNITY): Payer: Self-pay | Admitting: Hematology

## 2019-03-12 ENCOUNTER — Other Ambulatory Visit: Payer: Self-pay

## 2019-03-12 ENCOUNTER — Inpatient Hospital Stay (HOSPITAL_BASED_OUTPATIENT_CLINIC_OR_DEPARTMENT_OTHER): Payer: Medicare Other | Admitting: Hematology

## 2019-03-12 VITALS — BP 135/47 | HR 48 | Temp 97.9°F | Resp 18 | Wt 129.0 lb

## 2019-03-12 VITALS — BP 143/58 | HR 50 | Temp 98.6°F | Resp 16

## 2019-03-12 DIAGNOSIS — C7800 Secondary malignant neoplasm of unspecified lung: Secondary | ICD-10-CM

## 2019-03-12 DIAGNOSIS — I10 Essential (primary) hypertension: Secondary | ICD-10-CM

## 2019-03-12 DIAGNOSIS — Z171 Estrogen receptor negative status [ER-]: Secondary | ICD-10-CM | POA: Diagnosis not present

## 2019-03-12 DIAGNOSIS — E7800 Pure hypercholesterolemia, unspecified: Secondary | ICD-10-CM

## 2019-03-12 DIAGNOSIS — Z5112 Encounter for antineoplastic immunotherapy: Secondary | ICD-10-CM | POA: Diagnosis not present

## 2019-03-12 DIAGNOSIS — Z7982 Long term (current) use of aspirin: Secondary | ICD-10-CM

## 2019-03-12 DIAGNOSIS — C50912 Malignant neoplasm of unspecified site of left female breast: Secondary | ICD-10-CM

## 2019-03-12 DIAGNOSIS — R197 Diarrhea, unspecified: Secondary | ICD-10-CM | POA: Diagnosis not present

## 2019-03-12 DIAGNOSIS — C50512 Malignant neoplasm of lower-outer quadrant of left female breast: Secondary | ICD-10-CM

## 2019-03-12 DIAGNOSIS — E785 Hyperlipidemia, unspecified: Secondary | ICD-10-CM | POA: Diagnosis not present

## 2019-03-12 DIAGNOSIS — Z8249 Family history of ischemic heart disease and other diseases of the circulatory system: Secondary | ICD-10-CM

## 2019-03-12 DIAGNOSIS — Z79899 Other long term (current) drug therapy: Secondary | ICD-10-CM

## 2019-03-12 LAB — COMPREHENSIVE METABOLIC PANEL
ALT: 17 U/L (ref 0–44)
AST: 17 U/L (ref 15–41)
Albumin: 3.6 g/dL (ref 3.5–5.0)
Alkaline Phosphatase: 54 U/L (ref 38–126)
Anion gap: 6 (ref 5–15)
BUN: 26 mg/dL — ABNORMAL HIGH (ref 8–23)
CO2: 25 mmol/L (ref 22–32)
Calcium: 8.9 mg/dL (ref 8.9–10.3)
Chloride: 109 mmol/L (ref 98–111)
Creatinine, Ser: 1.16 mg/dL — ABNORMAL HIGH (ref 0.44–1.00)
GFR calc Af Amer: 47 mL/min — ABNORMAL LOW (ref >60)
GFR calc non Af Amer: 41 mL/min — ABNORMAL LOW (ref >60)
Glucose, Bld: 134 mg/dL — ABNORMAL HIGH (ref 70–99)
Potassium: 4.5 mmol/L (ref 3.5–5.1)
Sodium: 140 mmol/L (ref 135–145)
Total Bilirubin: 0.5 mg/dL (ref 0.3–1.2)
Total Protein: 5.9 g/dL — ABNORMAL LOW (ref 6.5–8.1)

## 2019-03-12 LAB — CBC WITH DIFFERENTIAL/PLATELET
Abs Immature Granulocytes: 0.01 10*3/uL (ref 0.00–0.07)
Basophils Absolute: 0 10*3/uL (ref 0.0–0.1)
Basophils Relative: 1 %
Eosinophils Absolute: 0.2 10*3/uL (ref 0.0–0.5)
Eosinophils Relative: 5 %
HCT: 34.6 % — ABNORMAL LOW (ref 36.0–46.0)
Hemoglobin: 10.8 g/dL — ABNORMAL LOW (ref 12.0–15.0)
Immature Granulocytes: 0 %
Lymphocytes Relative: 19 %
Lymphs Abs: 0.8 10*3/uL (ref 0.7–4.0)
MCH: 30.2 pg (ref 26.0–34.0)
MCHC: 31.2 g/dL (ref 30.0–36.0)
MCV: 96.6 fL (ref 80.0–100.0)
Monocytes Absolute: 0.3 10*3/uL (ref 0.1–1.0)
Monocytes Relative: 8 %
Neutro Abs: 2.8 10*3/uL (ref 1.7–7.7)
Neutrophils Relative %: 67 %
Platelets: 204 10*3/uL (ref 150–400)
RBC: 3.58 MIL/uL — ABNORMAL LOW (ref 3.87–5.11)
RDW: 12.9 % (ref 11.5–15.5)
WBC: 4.1 10*3/uL (ref 4.0–10.5)
nRBC: 0 % (ref 0.0–0.2)

## 2019-03-12 MED ORDER — SODIUM CHLORIDE 0.9% FLUSH
10.0000 mL | INTRAVENOUS | Status: DC | PRN
  Administered 2019-03-12: 10 mL
  Filled 2019-03-12: qty 10

## 2019-03-12 MED ORDER — DIPHENHYDRAMINE HCL 25 MG PO CAPS
25.0000 mg | ORAL_CAPSULE | Freq: Once | ORAL | Status: AC
  Administered 2019-03-12: 25 mg via ORAL

## 2019-03-12 MED ORDER — SODIUM CHLORIDE 0.9 % IV SOLN
Freq: Once | INTRAVENOUS | Status: AC
  Administered 2019-03-12: 10:00:00 via INTRAVENOUS

## 2019-03-12 MED ORDER — HEPARIN SOD (PORK) LOCK FLUSH 100 UNIT/ML IV SOLN
500.0000 [IU] | Freq: Once | INTRAVENOUS | Status: AC | PRN
  Administered 2019-03-12: 500 [IU]

## 2019-03-12 MED ORDER — DIPHENHYDRAMINE HCL 25 MG PO CAPS
ORAL_CAPSULE | ORAL | Status: AC
  Filled 2019-03-12: qty 1

## 2019-03-12 MED ORDER — ACETAMINOPHEN 325 MG PO TABS
ORAL_TABLET | ORAL | Status: AC
  Filled 2019-03-12: qty 2

## 2019-03-12 MED ORDER — SODIUM CHLORIDE 0.9 % IV SOLN
2.4000 mg/kg | Freq: Once | INTRAVENOUS | Status: AC
  Administered 2019-03-12: 140 mg via INTRAVENOUS
  Filled 2019-03-12: qty 7

## 2019-03-12 MED ORDER — PROCHLORPERAZINE MALEATE 10 MG PO TABS
ORAL_TABLET | ORAL | Status: AC
  Filled 2019-03-12: qty 1

## 2019-03-12 MED ORDER — ACETAMINOPHEN 325 MG PO TABS
650.0000 mg | ORAL_TABLET | Freq: Once | ORAL | Status: AC
  Administered 2019-03-12: 650 mg via ORAL

## 2019-03-12 MED ORDER — PROCHLORPERAZINE MALEATE 10 MG PO TABS
10.0000 mg | ORAL_TABLET | Freq: Once | ORAL | Status: AC
  Administered 2019-03-12: 10 mg via ORAL

## 2019-03-12 NOTE — Assessment & Plan Note (Addendum)
1.  Metastatic HER-2 positive left breast cancer: - Left modified radical mastectomy on 05/28/2018, ER/PR negative, HER-2 3+ positive, 3/3 lymph nodes positive - PET scan on 07/23/2018 shows few scattered mildly hypermetabolic pulmonary nodules, hypermetabolic left axillary, right paratracheal, right hilar, subcarinal adenopathy compatible with metastatic disease. - 4 cycles of single agent Herceptin every 3 weeks from 08/09/2018 through 10/18/2018 with progression. -Perjeta and Herceptin started on 11/15/2018, with PET CT scan on 02/25/2019 suggesting increased size and increased hypermetabolic activity involving bilateral supraclavicular lymph nodes, left axillary lymph node, mediastinal and right hilar lymph node and multiple bilateral pulmonary metastasis. - Last echocardiogram on 01/02/2019 shows EF of 60 to 65%. - We discussed the findings on the PET CT scan.  I have recommended change to Kadcyla every 3 weeks.  We will start low-dose of 2.4 mg/kg.  We discussed the side effects in detail.  We will continue to monitor her ejection fraction closely. - She will proceed with her first treatment today.  I will reevaluate her in 3 weeks.  I plan to increase her dose if she tolerates it well.

## 2019-03-12 NOTE — Progress Notes (Signed)
[address] [place], [place] [address]   CLINIC:  Medical Oncology/Hematology  PCP:  [name], MD [place] [address] [phone]   REASON FOR VISIT:  Follow-up for Breast Cancer   CURRENT THERAPY: Herceptin/Projecta  BRIEF ONCOLOGIC HISTORY:  Oncology History  Invasive ductal carcinoma of left breast ([place])  03/30/2017 Initial Diagnosis   Invasive ductal carcinoma of left breast ([place])   08/09/2018 - 03/20/2019 Chemotherapy   The patient had trastuzumab (HERCEPTIN) 500 mg in sodium chloride 0.9 % 250 mL chemo infusion, 525 mg, Intravenous,  Once, 10 of 10 cycles Administration: 500 mg (08/09/2018), 350 mg (09/06/2018), 350 mg (09/27/2018), 378 mg (10/18/2018), 378 mg (11/15/2018), 378 mg (12/06/2018), 378 mg (12/28/2018), 378 mg (01/18/2019), 378 mg (02/08/2019) pertuzumab (PERJETA) 840 mg in sodium chloride 0.9 % 250 mL chemo infusion, 840 mg (100 % of original dose 840 mg), Intravenous, Once, 6 of 6 cycles Dose modification: 840 mg (original dose 840 mg, Cycle 5), 420 mg (original dose 420 mg, Cycle 6) Administration: 840 mg (11/15/2018), 420 mg (12/06/2018), 420 mg (12/28/2018), 420 mg (01/18/2019), 420 mg (02/08/2019)  for chemotherapy treatment.    09/18/2018 Genetic Testing   Negative genetic testing for the breast and gyn cancer panel.  The Breast/GYN gene panel offered by [facility] includes sequencing and rearrangement analysis for the following 23 genes:  ATM, BRCA1, BRCA2, BRIP1, CDH1, CHEK2, EPCAM, MLH1, MSH2, MSH6, NBN, NF1, PALB2, PMS2, PTEN, RAD51C, RAD51D, STK11, and TP53.   The report date is 09/18/2018.   03/12/2019 -  Chemotherapy   The patient had ado-trastuzumab emtansine (KADCYLA) 140 mg in sodium chloride 0.9 % 250 mL chemo infusion, 2.4 mg/kg = 140 mg (100 % of original dose 2.4 mg/kg), Intravenous, Once, 1 of 5 cycles Dose modification: 2.4 mg/kg (original dose 2.4 mg/kg, Cycle 1, Reason: Patient Age) Administration: 140  mg (03/12/2019)  for chemotherapy treatment.       CANCER STAGING: Cancer Staging No matching staging information was found for the patient.   INTERVAL HISTORY:  Ms. Sanks 85 y.o. female seen for follow-up of metastatic HER-2 positive breast cancer.  Has occasional diarrhea after prior treatments.  She is continuing to be very active.  She cooks dinner for her whole family every day.  Her appetite is 50% and energy levels are 50%.  No new onset pains reported.  No tingling or numbness in extremities.  Denies any nausea vomiting or constipation.  No fevers or chills reported.   REVIEW OF SYSTEMS:  Review of Systems  Gastrointestinal: Positive for diarrhea.  All other systems reviewed and are negative.    PAST MEDICAL/SURGICAL HISTORY:  Past Medical History:  Diagnosis Date  . Cancer ([place])    left breast  . History of kidney stones   . Hypercholesteremia   . Hypertension    Past Surgical History:  Procedure Laterality Date  . APPENDECTOMY    . brain cyst removed  2011  . BREAST BIOPSY Left 03/28/2018   Procedure: BREAST BIOPSY;  Surgeon: [name], MD;  Location: [facility];  Service: General;  Laterality: Left;  . CATARACT EXTRACTION W/PHACO Left 11/09/2015   Procedure: CATARACT EXTRACTION PHACO AND INTRAOCULAR LENS PLACEMENT LEFT EYE cde=8.97;  Surgeon: [name], MD;  Location: [facility];  Service: Ophthalmology;  Laterality: Left;  . CATARACT EXTRACTION W/PHACO Right 02/04/2019   Procedure: CATARACT EXTRACTION PHACO AND INTRAOCULAR LENS PLACEMENT (IOC);  Surgeon: [name], MD;  Location: [facility];  Service:  Ophthalmology;  Laterality: Right;  CDE: 15.19  . EYE SURGERY     KPE left  . MASTECTOMY MODIFIED RADICAL Left 05/28/2018   Procedure: LEFT MODIFIED RADICAL MASTECTOMY;  Surgeon: [name], MD;  Location: [facility];  Service: General;  Laterality: Left;  Marland Kitchen MASTECTOMY, PARTIAL Left 04/24/2017   Procedure: MASTECTOMY PARTIAL;  Surgeon: [name], MD;  Location: [facility];   Service: General;  Laterality: Left;  . PORTACATH PLACEMENT Right 08/08/2018   Procedure: INSERTION PORT-A-CATH;  Surgeon: [name], MD;  Location: [facility];  Service: General;  Laterality: Right;  . TONSILLECTOMY    . TONSILLECTOMY AND ADENOIDECTOMY       SOCIAL HISTORY:  Social History   Socioeconomic History  . Marital status: Widowed    Spouse name: Not on file  . Number of children: Not on file  . Years of education: Not on file  . Highest education level: Not on file  Occupational History  . Not on file  Social Needs  . Financial resource strain: Not hard at all  . Food insecurity    Worry: Never true    Inability: Never true  . Transportation needs    Medical: No    Non-medical: No  Tobacco Use  . Smoking status: Never Smoker  . Smokeless tobacco: Never Used  Substance and Sexual Activity  . Alcohol use: No  . Drug use: No  . Sexual activity: Not Currently    Partners: Male  Lifestyle  . Physical activity    Days per week: Patient refused    Minutes per session: Patient refused  . Stress: Not at all  Relationships  . Social Herbalist on phone: Patient refused    Gets together: Patient refused    Attends religious service: Patient refused    Active member of club or organization: Patient refused    Attends meetings of clubs or organizations: Patient refused    Relationship status: Patient refused  . Intimate partner violence    Fear of current or ex partner: Patient refused    Emotionally abused: Patient refused    Physically abused: Patient refused    Forced sexual activity: Patient refused  Other Topics Concern  . Not on file  Social History Narrative  . Not on file    FAMILY HISTORY:  Family History  Problem Relation Age of Onset  . Heart failure Mother   . Heart failure Father   . Congestive Heart Failure Brother   . Tuberculosis Paternal Uncle   . Tuberculosis Maternal Grandmother   . Tuberculosis Maternal Grandfather   .  Congestive Heart Failure Brother     CURRENT MEDICATIONS:  Outpatient Encounter Medications as of 03/12/2019  Medication Sig  . Ado-Trastuzumab Emtansine (KADCYLA IV) Inject into the vein every 21 ( twenty-one) days.  Marland Kitchen amLODipine (NORVASC) 5 MG tablet Take 5 mg by mouth daily.  Marland Kitchen aspirin EC 81 MG tablet Take 81 mg by mouth 2 (two) times a day.  . ferrous sulfate 325 (65 FE) MG tablet Take 325 mg by mouth daily with breakfast.  . labetalol (NORMODYNE) 100 MG tablet Take 1 tablet (100 mg total) by mouth 2 (two) times daily. (Patient taking differently: Take 100 mg by mouth daily. )  . lidocaine-prilocaine (EMLA) cream Apply 1 application topically as needed (port access).  . LORazepam (ATIVAN) 1 MG tablet Take 1 mg by mouth daily.   . naproxen sodium (ALEVE) 220 MG tablet Take 220 mg by mouth daily  as needed (for headache or pain).  Marland Kitchen oxybutynin (DITROPAN) 5 MG tablet Take 5 mg by mouth daily.   . simvastatin (ZOCOR) 40 MG tablet Take 40 mg by mouth daily.  . [DISCONTINUED] prochlorperazine (COMPAZINE) 10 MG tablet Take 1 tablet (10 mg total) by mouth every 6 (six) hours as needed (Nausea or vomiting). (Patient taking differently: Take 10 mg by mouth daily. )   No facility-administered encounter medications on file as of 03/12/2019.     ALLERGIES:  No Known Allergies   PHYSICAL EXAM:  ECOG Performance status: 1  Vitals:   03/12/19 0812  BP: (!) 135/47  Pulse: (!) 48  Resp: 18  Temp: 97.9 F (36.6 C)  SpO2: 98%   Filed Weights   03/12/19 0812  Weight: 129 lb (58.5 kg)    Physical Exam Constitutional:      Appearance: Normal appearance.  HENT:     Head: Normocephalic.     Nose: Nose normal.  Eyes:     Conjunctiva/sclera: Conjunctivae normal.  Neck:     Musculoskeletal: Normal range of motion.  Cardiovascular:     Rate and Rhythm: Normal rate and regular rhythm.     Pulses: Normal pulses.     Heart sounds: Normal heart sounds.  Pulmonary:     Effort: Pulmonary  effort is normal.     Breath sounds: Normal breath sounds.  Abdominal:     General: Bowel sounds are normal.  Musculoskeletal: Normal range of motion.  Skin:    General: Skin is warm and dry.  Neurological:     General: No focal deficit present.     Mental Status: She is alert and oriented to person, place, and time.  Psychiatric:        Mood and Affect: Mood normal.        Behavior: Behavior normal.        Thought Content: Thought content normal.        Judgment: Judgment normal.      LABORATORY DATA:  I have reviewed the labs as listed.  CBC    Component Value Date/Time   WBC 4.1 03/12/2019 0835   RBC 3.58 (L) 03/12/2019 0835   HGB 10.8 (L) 03/12/2019 0835   HCT 34.6 (L) 03/12/2019 0835   PLT 204 03/12/2019 0835   MCV 96.6 03/12/2019 0835   MCH 30.2 03/12/2019 0835   MCHC 31.2 03/12/2019 0835   RDW 12.9 03/12/2019 0835   LYMPHSABS 0.8 03/12/2019 0835   MONOABS 0.3 03/12/2019 0835   EOSABS 0.2 03/12/2019 0835   BASOSABS 0.0 03/12/2019 0835   CMP Latest Ref Rng & Units 03/12/2019 02/28/2019 02/08/2019  Glucose 70 - 99 mg/dL 134(H) 120(H) 136(H)  BUN 8 - 23 mg/dL 26(H) 25(H) 23  Creatinine 0.44 - 1.00 mg/dL 1.16(H) 1.21(H) 1.22(H)  Sodium 135 - 145 mmol/L 140 143 141  Potassium 3.5 - 5.1 mmol/L 4.5 4.6 4.3  Chloride 98 - 111 mmol/L 109 109 109  CO2 22 - 32 mmol/L '25 24 23  ' Calcium 8.9 - 10.3 mg/dL 8.9 9.0 8.6(L)  Total Protein 6.5 - 8.1 g/dL 5.9(L) 5.8(L) 5.7(L)  Total Bilirubin 0.3 - 1.2 mg/dL 0.5 0.4 0.6  Alkaline Phos 38 - 126 U/L 54 53 54  AST 15 - 41 U/L '17 19 17  ' ALT 0 - 44 U/L '17 16 16   ' I have independently reviewed her PET CT scan and discussed with the patient.   ASSESSMENT & PLAN:   Invasive ductal carcinoma of left  breast ([place]) 1.  Metastatic HER-2 positive left breast cancer: - Left modified radical mastectomy on 05/28/2018, ER/PR negative, HER-2 3+ positive, 3/3 lymph nodes positive - PET scan on 07/23/2018 shows few scattered mildly hypermetabolic  pulmonary nodules, hypermetabolic left axillary, right paratracheal, right hilar, subcarinal adenopathy compatible with metastatic disease. - 4 cycles of single agent Herceptin every 3 weeks from 08/09/2018 through 10/18/2018 with progression. -Perjeta and Herceptin started on 11/15/2018, with PET CT scan on 02/25/2019 suggesting increased size and increased hypermetabolic activity involving bilateral supraclavicular lymph nodes, left axillary lymph node, mediastinal and right hilar lymph node and multiple bilateral pulmonary metastasis. - Last echocardiogram on 01/02/2019 shows EF of 60 to 65%. - We discussed the findings on the PET CT scan.  I have recommended change to Kadcyla every 3 weeks.  We will start low-dose of 2.4 mg/kg.  We discussed the side effects in detail.  We will continue to monitor her ejection fraction closely. - She will proceed with her first treatment today.  I will reevaluate her in 3 weeks.  I plan to increase her dose if she tolerates it well.  Total time spent is 40 minutes with more than 50% of the time spent face-to-face discussing scan results, treatment change, side effects counseling and coordination of care.   Orders placed this encounter:  Orders Placed This Encounter  Procedures  . CBC with Differential/Platelet  . Comprehensive metabolic panel     Derek Jack, MD  [place] [phone]

## 2019-03-12 NOTE — Patient Instructions (Signed)
[facility] at [facility] Discharge Instructions  You were seen today by Dr. Katragadda. He went over your recent lab results. He will see you back in 3 weeks for labs and follow up.   Thank you for choosing [facility] at [facility] to provide your oncology and hematology care.  To afford each patient quality time with our provider, please arrive at least 15 minutes before your scheduled appointment time.   If you have a lab appointment with the Cancer Center please come in thru the  Main Entrance and check in at the main information desk  You need to re-schedule your appointment should you arrive 10 or more minutes late.  We strive to give you quality time with our providers, and arriving late affects you and other patients whose appointments are after yours.  Also, if you no show three or more times for appointments you may be dismissed from the clinic at the providers discretion.     Again, thank you for choosing [facility].  Our hope is that these requests will decrease the amount of time that you wait before being seen by our physicians.       _____________________________________________________________  Should you have questions after your visit to [facility], please contact our office at [phone] between the hours of 8:00 a.m. and 4:30 p.m.  Voicemails left after 4:00 p.m. will not be returned until the following business day.  For prescription refill requests, have your pharmacy contact our office and allow 72 hours.    Cancer Center Support Programs:   > Cancer Support Group  2nd Tuesday of the month 1pm-2pm, Journey Room    

## 2019-03-12 NOTE — Progress Notes (Signed)
03/12/19  Decrease dose of diphenhydramine to 25 mg po as premedication for Kadcyla.  V.O. Dr Rhys Martini, PharmD

## 2019-03-12 NOTE — Patient Instructions (Signed)
[facility] Discharge Instructions for Patients Receiving Chemotherapy  Today you received the following chemotherapy agents   To help prevent nausea and vomiting after your treatment, we encourage you to take your nausea medication   If you develop nausea and vomiting that is not controlled by your nausea medication, call the clinic.   BELOW ARE SYMPTOMS THAT SHOULD BE REPORTED IMMEDIATELY:  *FEVER GREATER THAN 100.5 F  *CHILLS WITH OR WITHOUT FEVER  NAUSEA AND VOMITING THAT IS NOT CONTROLLED WITH YOUR NAUSEA MEDICATION  *UNUSUAL SHORTNESS OF BREATH  *UNUSUAL BRUISING OR BLEEDING  TENDERNESS IN MOUTH AND THROAT WITH OR WITHOUT PRESENCE OF ULCERS  *URINARY PROBLEMS  *BOWEL PROBLEMS  UNUSUAL RASH Items with * indicate a potential emergency and should be followed up as soon as possible.  Feel free to call the clinic should you have any questions or concerns. The clinic phone number is [phone].  Please show the CHEMO ALERT CARD at check-in to the Emergency Department and triage nurse.   

## 2019-03-12 NOTE — Progress Notes (Signed)
Labs reviewed with MD at office visit today. Will start off with decreased dose of kadcyla per MD and increase as she tolerates. Proceed today per MD.   Consent and teaching done today at bedside.  Monitored patient for 90 min post chemotherapy per protocol. No issues after treatment at this time.   Treatment given per orders. Patient tolerated it well without problems. Vitals stable and discharged home from clinic ambulatory. Follow up as scheduled.

## 2019-03-12 NOTE — Patient Instructions (Signed)
[facility] at [facility] Discharge Instructions  Labs drawn from portacath today   Thank you for choosing [facility] at [facility] to provide your oncology and hematology care.  To afford each patient quality time with our provider, please arrive at least 15 minutes before your scheduled appointment time.   If you have a lab appointment with the Cancer Center please come in thru the  Main Entrance and check in at the main information desk  You need to re-schedule your appointment should you arrive 10 or more minutes late.  We strive to give you quality time with our providers, and arriving late affects you and other patients whose appointments are after yours.  Also, if you no show three or more times for appointments you may be dismissed from the clinic at the providers discretion.     Again, thank you for choosing [facility].  Our hope is that these requests will decrease the amount of time that you wait before being seen by our physicians.       _____________________________________________________________  Should you have questions after your visit to [facility], please contact our office at [phone] between the hours of 8:00 a.m. and 4:30 p.m.  Voicemails left after 4:00 p.m. will not be returned until the following business day.  For prescription refill requests, have your pharmacy contact our office and allow 72 hours.    Cancer Center Support Programs:   > Cancer Support Group  2nd Tuesday of the month 1pm-2pm, Journey Room   

## 2019-03-13 ENCOUNTER — Telehealth (HOSPITAL_COMMUNITY): Payer: Self-pay

## 2019-03-13 NOTE — Telephone Encounter (Signed)
24 hour follow up-patient states she is doing fine today, no issues reported.

## 2019-04-02 ENCOUNTER — Other Ambulatory Visit: Payer: Self-pay

## 2019-04-02 ENCOUNTER — Inpatient Hospital Stay (HOSPITAL_BASED_OUTPATIENT_CLINIC_OR_DEPARTMENT_OTHER): Payer: Medicare Other | Admitting: Hematology

## 2019-04-02 ENCOUNTER — Inpatient Hospital Stay (HOSPITAL_COMMUNITY): Payer: Medicare Other | Attending: Hematology

## 2019-04-02 ENCOUNTER — Inpatient Hospital Stay (HOSPITAL_COMMUNITY): Payer: Medicare Other

## 2019-04-02 ENCOUNTER — Encounter (HOSPITAL_COMMUNITY): Payer: Self-pay | Admitting: Hematology

## 2019-04-02 VITALS — BP 146/56 | HR 55 | Temp 97.1°F | Resp 18 | Wt 127.0 lb

## 2019-04-02 VITALS — BP 123/47 | HR 51 | Temp 97.5°F | Resp 18

## 2019-04-02 DIAGNOSIS — R197 Diarrhea, unspecified: Secondary | ICD-10-CM | POA: Diagnosis not present

## 2019-04-02 DIAGNOSIS — G479 Sleep disorder, unspecified: Secondary | ICD-10-CM | POA: Insufficient documentation

## 2019-04-02 DIAGNOSIS — C773 Secondary and unspecified malignant neoplasm of axilla and upper limb lymph nodes: Secondary | ICD-10-CM | POA: Insufficient documentation

## 2019-04-02 DIAGNOSIS — Z170 Estrogen receptor positive status [ER+]: Secondary | ICD-10-CM

## 2019-04-02 DIAGNOSIS — Z8249 Family history of ischemic heart disease and other diseases of the circulatory system: Secondary | ICD-10-CM | POA: Diagnosis not present

## 2019-04-02 DIAGNOSIS — C50512 Malignant neoplasm of lower-outer quadrant of left female breast: Secondary | ICD-10-CM | POA: Diagnosis not present

## 2019-04-02 DIAGNOSIS — Z9221 Personal history of antineoplastic chemotherapy: Secondary | ICD-10-CM | POA: Diagnosis not present

## 2019-04-02 DIAGNOSIS — C50912 Malignant neoplasm of unspecified site of left female breast: Secondary | ICD-10-CM | POA: Insufficient documentation

## 2019-04-02 DIAGNOSIS — C7800 Secondary malignant neoplasm of unspecified lung: Secondary | ICD-10-CM | POA: Diagnosis not present

## 2019-04-02 DIAGNOSIS — Z825 Family history of asthma and other chronic lower respiratory diseases: Secondary | ICD-10-CM | POA: Diagnosis not present

## 2019-04-02 DIAGNOSIS — Z79899 Other long term (current) drug therapy: Secondary | ICD-10-CM | POA: Diagnosis not present

## 2019-04-02 DIAGNOSIS — Z87442 Personal history of urinary calculi: Secondary | ICD-10-CM | POA: Insufficient documentation

## 2019-04-02 DIAGNOSIS — Z5112 Encounter for antineoplastic immunotherapy: Secondary | ICD-10-CM | POA: Diagnosis not present

## 2019-04-02 LAB — COMPREHENSIVE METABOLIC PANEL
ALT: 19 U/L (ref 0–44)
AST: 27 U/L (ref 15–41)
Albumin: 3.7 g/dL (ref 3.5–5.0)
Alkaline Phosphatase: 51 U/L (ref 38–126)
Anion gap: 9 (ref 5–15)
BUN: 27 mg/dL — ABNORMAL HIGH (ref 8–23)
CO2: 24 mmol/L (ref 22–32)
Calcium: 9 mg/dL (ref 8.9–10.3)
Chloride: 108 mmol/L (ref 98–111)
Creatinine, Ser: 1.47 mg/dL — ABNORMAL HIGH (ref 0.44–1.00)
GFR calc Af Amer: 36 mL/min — ABNORMAL LOW (ref >60)
GFR calc non Af Amer: 31 mL/min — ABNORMAL LOW (ref >60)
Glucose, Bld: 111 mg/dL — ABNORMAL HIGH (ref 70–99)
Potassium: 4.8 mmol/L (ref 3.5–5.1)
Sodium: 141 mmol/L (ref 135–145)
Total Bilirubin: 0.5 mg/dL (ref 0.3–1.2)
Total Protein: 6.1 g/dL — ABNORMAL LOW (ref 6.5–8.1)

## 2019-04-02 LAB — CBC WITH DIFFERENTIAL/PLATELET
Abs Immature Granulocytes: 0.01 10*3/uL (ref 0.00–0.07)
Basophils Absolute: 0.1 10*3/uL (ref 0.0–0.1)
Basophils Relative: 1 %
Eosinophils Absolute: 0.3 10*3/uL (ref 0.0–0.5)
Eosinophils Relative: 7 %
HCT: 34.3 % — ABNORMAL LOW (ref 36.0–46.0)
Hemoglobin: 10.6 g/dL — ABNORMAL LOW (ref 12.0–15.0)
Immature Granulocytes: 0 %
Lymphocytes Relative: 22 %
Lymphs Abs: 0.9 10*3/uL (ref 0.7–4.0)
MCH: 29.6 pg (ref 26.0–34.0)
MCHC: 30.9 g/dL (ref 30.0–36.0)
MCV: 95.8 fL (ref 80.0–100.0)
Monocytes Absolute: 0.4 10*3/uL (ref 0.1–1.0)
Monocytes Relative: 9 %
Neutro Abs: 2.5 10*3/uL (ref 1.7–7.7)
Neutrophils Relative %: 61 %
Platelets: 260 10*3/uL (ref 150–400)
RBC: 3.58 MIL/uL — ABNORMAL LOW (ref 3.87–5.11)
RDW: 13.1 % (ref 11.5–15.5)
WBC: 4.1 10*3/uL (ref 4.0–10.5)
nRBC: 0 % (ref 0.0–0.2)

## 2019-04-02 MED ORDER — SODIUM CHLORIDE 0.9 % IV SOLN
3.0000 mg/kg | Freq: Once | INTRAVENOUS | Status: AC
  Administered 2019-04-02: 180 mg via INTRAVENOUS
  Filled 2019-04-02: qty 9

## 2019-04-02 MED ORDER — SODIUM CHLORIDE 0.9% FLUSH
10.0000 mL | INTRAVENOUS | Status: DC | PRN
  Administered 2019-04-02: 10 mL
  Filled 2019-04-02: qty 10

## 2019-04-02 MED ORDER — PROCHLORPERAZINE MALEATE 10 MG PO TABS
10.0000 mg | ORAL_TABLET | Freq: Four times a day (QID) | ORAL | Status: DC | PRN
  Administered 2019-04-02: 10 mg via ORAL
  Filled 2019-04-02: qty 1

## 2019-04-02 MED ORDER — SODIUM CHLORIDE 0.9 % IV SOLN
Freq: Once | INTRAVENOUS | Status: AC
  Administered 2019-04-02: 10:00:00 via INTRAVENOUS

## 2019-04-02 MED ORDER — DIPHENHYDRAMINE HCL 25 MG PO CAPS
25.0000 mg | ORAL_CAPSULE | Freq: Once | ORAL | Status: AC
  Administered 2019-04-02: 25 mg via ORAL
  Filled 2019-04-02: qty 1

## 2019-04-02 MED ORDER — ACETAMINOPHEN 325 MG PO TABS
650.0000 mg | ORAL_TABLET | Freq: Once | ORAL | Status: AC
  Administered 2019-04-02: 650 mg via ORAL
  Filled 2019-04-02: qty 2

## 2019-04-02 MED ORDER — HEPARIN SOD (PORK) LOCK FLUSH 100 UNIT/ML IV SOLN
500.0000 [IU] | Freq: Once | INTRAVENOUS | Status: AC | PRN
  Administered 2019-04-02: 500 [IU]

## 2019-04-02 NOTE — Progress Notes (Signed)
1005 Labs reviewed with and pt seen by Dr. Delton Coombes and pt approved for Kadcyla infusion with an increase in dosage per MD                                                                         Carol Duarte Apple Goettel tolerated Kadcyla infusion well without complaints or incident. VSS upon discharge. Pt discharged self ambulatory in satisfactory condition

## 2019-04-02 NOTE — Progress Notes (Signed)
[place], [place] [address]   CLINIC:  Medical Oncology/Hematology  PCP:  [name], MD [place] [address] [phone]   REASON FOR VISIT:  Follow-up for Breast Cancer   CURRENT THERAPY: Kadcyla.  BRIEF ONCOLOGIC HISTORY:  Oncology History  Invasive ductal carcinoma of left breast ([place])  03/30/2017 Initial Diagnosis   Invasive ductal carcinoma of left breast ([place])   08/09/2018 - 03/20/2019 Chemotherapy   The patient had trastuzumab (HERCEPTIN) 500 mg in sodium chloride 0.9 % 250 mL chemo infusion, 525 mg, Intravenous,  Once, 10 of 10 cycles Administration: 500 mg (08/09/2018), 350 mg (09/06/2018), 350 mg (09/27/2018), 378 mg (10/18/2018), 378 mg (11/15/2018), 378 mg (12/06/2018), 378 mg (12/28/2018), 378 mg (01/18/2019), 378 mg (02/08/2019) pertuzumab (PERJETA) 840 mg in sodium chloride 0.9 % 250 mL chemo infusion, 840 mg (100 % of original dose 840 mg), Intravenous, Once, 6 of 6 cycles Dose modification: 840 mg (original dose 840 mg, Cycle 5), 420 mg (original dose 420 mg, Cycle 6) Administration: 840 mg (11/15/2018), 420 mg (12/06/2018), 420 mg (12/28/2018), 420 mg (01/18/2019), 420 mg (02/08/2019)  for chemotherapy treatment.    09/18/2018 Genetic Testing   Negative genetic testing for the breast and gyn cancer panel.  The Breast/GYN gene panel offered by [facility] includes sequencing and rearrangement analysis for the following 23 genes:  ATM, BRCA1, BRCA2, BRIP1, CDH1, CHEK2, EPCAM, MLH1, MSH2, MSH6, NBN, NF1, PALB2, PMS2, PTEN, RAD51C, RAD51D, STK11, and TP53.   The report date is 09/18/2018.   03/12/2019 -  Chemotherapy   The patient had ado-trastuzumab emtansine (KADCYLA) 140 mg in sodium chloride 0.9 % 250 mL chemo infusion, 2.4 mg/kg = 140 mg (100 % of original dose 2.4 mg/kg), Intravenous, Once, 2 of 5 cycles Dose modification: 2.4 mg/kg (original dose 2.4 mg/kg, Cycle 1, Reason: Patient Age), 3 mg/kg (original dose 2.4  mg/kg, Cycle 3, Reason: Provider Judgment) Administration: 140 mg (03/12/2019), 180 mg (04/02/2019)  for chemotherapy treatment.       CANCER STAGING: Cancer Staging No matching staging information was found for the patient.   INTERVAL HISTORY:  Ms. Sarratt 85 y.o. female seen for follow-up of metastatic HER-2 positive breast cancer.  She has received first cycle of Kadcyla on 03/12/2019.  Did not experience any major GI side effects.  Appetite is 25%.  Energy levels are 75%.  Lost about 2 pounds of weight since last visit.  Sleep problems have been stable.  No pains reported.  She is being active and is able to cook for her family of 4 members.  Denies any fevers or chills.  Denies any symptoms of PND or orthopnea.  No ankle swellings reported.   REVIEW OF SYSTEMS:  Review of Systems  Psychiatric/Behavioral: Positive for sleep disturbance.  All other systems reviewed and are negative.    PAST MEDICAL/SURGICAL HISTORY:  Past Medical History:  Diagnosis Date  . Cancer ([place])    left breast  . History of kidney stones   . Hypercholesteremia   . Hypertension    Past Surgical History:  Procedure Laterality Date  . APPENDECTOMY    . brain cyst removed  2011  . BREAST BIOPSY Left 03/28/2018   Procedure: BREAST BIOPSY;  Surgeon: [name], MD;  Location: [facility];  Service: General;  Laterality: Left;  . CATARACT EXTRACTION W/PHACO Left 11/09/2015   Procedure: CATARACT EXTRACTION PHACO AND INTRAOCULAR LENS PLACEMENT LEFT EYE cde=8.97;  Surgeon: [name], MD;  Location:  [facility];  Service: Ophthalmology;  Laterality: Left;  . CATARACT EXTRACTION W/PHACO Right 02/04/2019   Procedure: CATARACT EXTRACTION PHACO AND INTRAOCULAR LENS PLACEMENT (IOC);  Surgeon: [name], MD;  Location: [facility];  Service: Ophthalmology;  Laterality: Right;  CDE: 15.19  . EYE SURGERY     KPE left  . MASTECTOMY MODIFIED RADICAL Left 05/28/2018   Procedure: LEFT MODIFIED RADICAL MASTECTOMY;  Surgeon: [name], MD;  Location: [facility];  Service: General;  Laterality: Left;  Marland Kitchen MASTECTOMY, PARTIAL Left 04/24/2017   Procedure: MASTECTOMY PARTIAL;  Surgeon: [name], MD;  Location: [facility];  Service: General;  Laterality: Left;  . PORTACATH PLACEMENT Right 08/08/2018   Procedure: INSERTION PORT-A-CATH;  Surgeon: [name], MD;  Location: [facility];  Service: General;  Laterality: Right;  . TONSILLECTOMY    . TONSILLECTOMY AND ADENOIDECTOMY       SOCIAL HISTORY:  Social History   Socioeconomic History  . Marital status: Widowed    Spouse name: Not on file  . Number of children: Not on file  . Years of education: Not on file  . Highest education level: Not on file  Occupational History  . Not on file  Social Needs  . Financial resource strain: Not hard at all  . Food insecurity    Worry: Never true    Inability: Never true  . Transportation needs    Medical: No    Non-medical: No  Tobacco Use  . Smoking status: Never Smoker  . Smokeless tobacco: Never Used  Substance and Sexual Activity  . Alcohol use: No  . Drug use: No  . Sexual activity: Not Currently    Partners: Male  Lifestyle  . Physical activity    Days per week: Patient refused    Minutes per session: Patient refused  . Stress: Not at all  Relationships  . Social Herbalist on phone: Patient refused    Gets together: Patient refused    Attends religious service: Patient refused    Active member of club or organization: Patient refused    Attends meetings of clubs or organizations: Patient refused    Relationship status: Patient refused  . Intimate partner violence    Fear of current or ex partner: Patient refused    Emotionally abused: Patient refused    Physically abused: Patient refused    Forced sexual activity: Patient refused  Other Topics Concern  . Not on file  Social History Narrative  . Not on file    FAMILY HISTORY:  Family History  Problem Relation Age of Onset  . Heart failure  Mother   . Heart failure Father   . Congestive Heart Failure Brother   . Tuberculosis Paternal Uncle   . Tuberculosis Maternal Grandmother   . Tuberculosis Maternal Grandfather   . Congestive Heart Failure Brother     CURRENT MEDICATIONS:  Outpatient Encounter Medications as of 04/02/2019  Medication Sig  . Ado-Trastuzumab Emtansine (KADCYLA IV) Inject into the vein every 21 ( twenty-one) days.  Marland Kitchen amLODipine (NORVASC) 5 MG tablet Take 5 mg by mouth daily.  Marland Kitchen aspirin EC 81 MG tablet Take 81 mg by mouth 2 (two) times a day.  . ferrous sulfate 325 (65 FE) MG tablet Take 325 mg by mouth daily with breakfast.  . labetalol (NORMODYNE) 100 MG tablet Take 1 tablet (100 mg total) by mouth 2 (two) times daily. (Patient taking differently: Take 100 mg by mouth daily. )  . lidocaine-prilocaine (EMLA)  cream Apply 1 application topically as needed (port access).  . LORazepam (ATIVAN) 1 MG tablet Take 1 mg by mouth daily.   . naproxen sodium (ALEVE) 220 MG tablet Take 220 mg by mouth daily as needed (for headache or pain).  Marland Kitchen oxybutynin (DITROPAN) 5 MG tablet Take 5 mg by mouth daily.   . simvastatin (ZOCOR) 40 MG tablet Take 40 mg by mouth daily.  . [DISCONTINUED] prochlorperazine (COMPAZINE) 10 MG tablet Take 1 tablet (10 mg total) by mouth every 6 (six) hours as needed (Nausea or vomiting). (Patient taking differently: Take 10 mg by mouth daily. )   No facility-administered encounter medications on file as of 04/02/2019.     ALLERGIES:  No Known Allergies   PHYSICAL EXAM:  ECOG Performance status: 1  Vitals:   04/02/19 0920  BP: (!) 146/56  Pulse: (!) 55  Resp: 18  Temp: (!) 97.1 F (36.2 C)  SpO2: 99%   Filed Weights   04/02/19 0920  Weight: 127 lb (57.6 kg)    Physical Exam Constitutional:      Appearance: Normal appearance.  HENT:     Head: Normocephalic.     Nose: Nose normal.  Eyes:     Conjunctiva/sclera: Conjunctivae normal.  Neck:     Musculoskeletal: Normal range of  motion.  Cardiovascular:     Rate and Rhythm: Normal rate and regular rhythm.     Pulses: Normal pulses.     Heart sounds: Normal heart sounds.  Pulmonary:     Effort: Pulmonary effort is normal.     Breath sounds: Normal breath sounds.  Abdominal:     General: Bowel sounds are normal.  Musculoskeletal: Normal range of motion.  Skin:    General: Skin is warm and dry.  Neurological:     General: No focal deficit present.     Mental Status: She is alert and oriented to person, place, and time.  Psychiatric:        Mood and Affect: Mood normal.        Behavior: Behavior normal.        Thought Content: Thought content normal.        Judgment: Judgment normal.      LABORATORY DATA:  I have reviewed the labs as listed.  CBC    Component Value Date/Time   WBC 4.1 04/02/2019 0904   RBC 3.58 (L) 04/02/2019 0904   HGB 10.6 (L) 04/02/2019 0904   HCT 34.3 (L) 04/02/2019 0904   PLT 260 04/02/2019 0904   MCV 95.8 04/02/2019 0904   MCH 29.6 04/02/2019 0904   MCHC 30.9 04/02/2019 0904   RDW 13.1 04/02/2019 0904   LYMPHSABS 0.9 04/02/2019 0904   MONOABS 0.4 04/02/2019 0904   EOSABS 0.3 04/02/2019 0904   BASOSABS 0.1 04/02/2019 0904   CMP Latest Ref Rng & Units 04/02/2019 03/12/2019 02/28/2019  Glucose 70 - 99 mg/dL 111(H) 134(H) 120(H)  BUN 8 - 23 mg/dL 27(H) 26(H) 25(H)  Creatinine 0.44 - 1.00 mg/dL 1.47(H) 1.16(H) 1.21(H)  Sodium 135 - 145 mmol/L 141 140 143  Potassium 3.5 - 5.1 mmol/L 4.8 4.5 4.6  Chloride 98 - 111 mmol/L 108 109 109  CO2 22 - 32 mmol/L _0 Calcium 8.9 - 10.3 mg/dL 9.0 8.9 9.0  Total Protein 6.5 - 8.1 g/dL 6.1(L) 5.9(L) 5.8(L)  Total Bilirubin 0.3 - 1.2 mg/dL 0.5 0.5 0.4  Alkaline Phos 38 - 126 U/L 51 54 53  AST 15 - 41 U/L 27  17 19  ALT 0 - 44 U/L _0 I have independently reviewed her PET CT scan and discussed with the patient.   ASSESSMENT & PLAN:   Invasive ductal carcinoma of left breast ([place]) 1.  Metastatic HER-2 positive left breast  cancer: - Left modified radical mastectomy 05/28/2018, ER/PR negative, HER-2 positive, 3 out of 3 lymph nodes positive -PET scan on 07/23/2018 shows few scattered mildly hypermetabolic pulmonary nodules, hypermetabolic left axillary, right paratracheal, right hilar, subcarinal adenopathy compatible with metastatic disease. - 4 cycles of single agent Herceptin every 3 weeks from 08/09/2018 through 10/18/2018 with progression. - Perjeta and Herceptin from 11/15/2018 through 02/08/2019 with PET scan on 02/24/2025 showing progression. - Cycle 1 of Kadcyla (2.4 mg/kg) on 03/12/2019. -She has tolerated first cycle of Kadcyla very well.  Did not have any GI side effects. - We have reviewed her blood work today.  We will increase the dose of Kadcyla to 3 mg/KG. -I will reevaluate her in 3 weeks.  2.  High risk drug monitoring: - Echocardiogram on 01/02/2019 shows EF of 60 to 65%. -She denies any symptoms of PND or orthopnea.  We will continue to monitor ejection fraction closely.  Total time spent is 25 minutes with more than 50% of the time spent face-to-face discussing treatment plan, counseling and coordination of care.   Orders placed this encounter:  Orders Placed This Encounter  Procedures  . ECHOCARDIOGRAM COMPLETE     Derek Jack, [place] [phone]

## 2019-04-02 NOTE — Patient Instructions (Signed)
[facility] Discharge Instructions  You were seen today by Dr. Katragadda. He went over your recent lab results. He will see you back in 3 weeks for labs and follow up.   Thank you for choosing [facility] to provide your oncology and hematology care.  To afford each patient quality time with our provider, please arrive at least 15 minutes before your scheduled appointment time.   If you have a lab appointment with the Cancer Center please come in thru the  Main Entrance and check in at the main information desk  You need to re-schedule your appointment should you arrive 10 or more minutes late.  We strive to give you quality time with our providers, and arriving late affects you and other patients whose appointments are after yours.  Also, if you no show three or more times for appointments you may be dismissed from the clinic at the providers discretion.     Again, thank you for choosing [facility].  Our hope is that these requests will decrease the amount of time that you wait before being seen by our physicians.       _____________________________________________________________  Should you have questions after your visit to [facility], please contact our office at [phone] between the hours of 8:00 a.m. and 4:30 p.m.  Voicemails left after 4:00 p.m. will not be returned until the following business day.  For prescription refill requests, have your pharmacy contact our office and allow 72 hours.    Cancer Center Support Programs:   > Cancer Support Group  2nd Tuesday of the month 1pm-2pm, Journey Room    

## 2019-04-02 NOTE — Patient Instructions (Signed)
[facility] Discharge Instructions for Patients Receiving Chemotherapy   Beginning January 23rd 2017 lab work for the Cancer Center will be done in the  Main lab at [place] on 1st floor. If you have a lab appointment with the Cancer Center please come in thru the  Main Entrance and check in at the main information desk   Today you received the following chemotherapy agents Kadcyla. Follow-up as scheduled. Call clinic for any questions or concerns  To help prevent nausea and vomiting after your treatment, we encourage you to take your nausea medication   If you develop nausea and vomiting, or diarrhea that is not controlled by your medication, call the clinic.  The clinic phone number is [phone]. Office hours are Monday-Friday 8:30am-5:00pm.  BELOW ARE SYMPTOMS THAT SHOULD BE REPORTED IMMEDIATELY:  *FEVER GREATER THAN 101.0 F  *CHILLS WITH OR WITHOUT FEVER  NAUSEA AND VOMITING THAT IS NOT CONTROLLED WITH YOUR NAUSEA MEDICATION  *UNUSUAL SHORTNESS OF BREATH  *UNUSUAL BRUISING OR BLEEDING  TENDERNESS IN MOUTH AND THROAT WITH OR WITHOUT PRESENCE OF ULCERS  *URINARY PROBLEMS  *BOWEL PROBLEMS  UNUSUAL RASH Items with * indicate a potential emergency and should be followed up as soon as possible. If you have an emergency after office hours please contact your primary care physician or go to the nearest emergency department.  Please call the clinic during office hours if you have any questions or concerns.   You may also contact the Patient Navigator at [phone] should you have any questions or need assistance in obtaining follow up care.      Resources For Cancer Patients and their Caregivers ? American Cancer Society: Can assist with transportation, wigs, general needs, runs Look Good Feel Better.        1-888-227-6333 ? Cancer Care: Provides financial assistance, online support groups, medication/co-pay assistance.  1-800-813-HOPE  (4673) ? [facility] Assists [place] cancer patients and their families through emotional , educational and financial support.  336-427-4357 ? [place] DSS Where to apply for food stamps, Medicaid and utility assistance. 336-342-1394 ? [facility]: Transportation to medical appointments. 336-347-2287 ? Social Security Administration: May apply for disability if have a Stage IV cancer. 336-342-7796 1-800-772-1213 ? [place] Aging, Disability and Transit Services: Assists with nutrition, care and transit needs. 336-349-2343         

## 2019-04-02 NOTE — Assessment & Plan Note (Signed)
1.  Metastatic HER-2 positive left breast cancer: - Left modified radical mastectomy 05/28/2018, ER/PR negative, HER-2 positive, 3 out of 3 lymph nodes positive -PET scan on 07/23/2018 shows few scattered mildly hypermetabolic pulmonary nodules, hypermetabolic left axillary, right paratracheal, right hilar, subcarinal adenopathy compatible with metastatic disease. - 4 cycles of single agent Herceptin every 3 weeks from 08/09/2018 through 10/18/2018 with progression. - Perjeta and Herceptin from 11/15/2018 through 02/08/2019 with PET scan on 02/24/2025 showing progression. - Cycle 1 of Kadcyla (2.4 mg/kg) on 03/12/2019. -She has tolerated first cycle of Kadcyla very well.  Did not have any GI side effects. - We have reviewed her blood work today.  We will increase the dose of Kadcyla to 3 mg/KG. -I will reevaluate her in 3 weeks.  2.  High risk drug monitoring: - Echocardiogram on 01/02/2019 shows EF of 60 to 65%. -She denies any symptoms of PND or orthopnea.  We will continue to monitor ejection fraction closely.

## 2019-04-15 ENCOUNTER — Ambulatory Visit (HOSPITAL_COMMUNITY)
Admission: RE | Admit: 2019-04-15 | Discharge: 2019-04-15 | Disposition: A | Discharge status: Home / Self Care | Payer: Medicare Other | Source: Ambulatory Visit | Attending: Hematology | Admitting: Hematology

## 2019-04-15 ENCOUNTER — Other Ambulatory Visit: Payer: Self-pay

## 2019-04-15 DIAGNOSIS — I313 Pericardial effusion (noninflammatory): Secondary | ICD-10-CM | POA: Diagnosis not present

## 2019-04-15 DIAGNOSIS — I10 Essential (primary) hypertension: Secondary | ICD-10-CM | POA: Diagnosis not present

## 2019-04-15 DIAGNOSIS — C50512 Malignant neoplasm of lower-outer quadrant of left female breast: Secondary | ICD-10-CM | POA: Diagnosis not present

## 2019-04-15 DIAGNOSIS — Z170 Estrogen receptor positive status [ER+]: Secondary | ICD-10-CM | POA: Insufficient documentation

## 2019-04-15 DIAGNOSIS — E785 Hyperlipidemia, unspecified: Secondary | ICD-10-CM | POA: Insufficient documentation

## 2019-04-15 DIAGNOSIS — Z9012 Acquired absence of left breast and nipple: Secondary | ICD-10-CM | POA: Insufficient documentation

## 2019-04-15 NOTE — Progress Notes (Signed)
*  PRELIMINARY RESULTS* Echocardiogram 2D Echocardiogram has been performed.  Carol Duarte 04/15/2019, 1:53 PM

## 2019-04-23 ENCOUNTER — Encounter (HOSPITAL_COMMUNITY): Payer: Self-pay | Admitting: Hematology

## 2019-04-23 ENCOUNTER — Encounter (HOSPITAL_COMMUNITY): Payer: Self-pay

## 2019-04-23 ENCOUNTER — Inpatient Hospital Stay (HOSPITAL_COMMUNITY): Payer: Medicare Other

## 2019-04-23 ENCOUNTER — Other Ambulatory Visit: Payer: Self-pay

## 2019-04-23 ENCOUNTER — Inpatient Hospital Stay (HOSPITAL_BASED_OUTPATIENT_CLINIC_OR_DEPARTMENT_OTHER): Payer: Medicare Other | Admitting: Hematology

## 2019-04-23 VITALS — BP 133/51 | HR 53 | Temp 96.7°F | Resp 18

## 2019-04-23 DIAGNOSIS — C50912 Malignant neoplasm of unspecified site of left female breast: Secondary | ICD-10-CM

## 2019-04-23 DIAGNOSIS — Z5112 Encounter for antineoplastic immunotherapy: Secondary | ICD-10-CM | POA: Diagnosis not present

## 2019-04-23 DIAGNOSIS — Z9221 Personal history of antineoplastic chemotherapy: Secondary | ICD-10-CM | POA: Diagnosis not present

## 2019-04-23 DIAGNOSIS — Z87442 Personal history of urinary calculi: Secondary | ICD-10-CM | POA: Diagnosis not present

## 2019-04-23 DIAGNOSIS — Z8249 Family history of ischemic heart disease and other diseases of the circulatory system: Secondary | ICD-10-CM | POA: Diagnosis not present

## 2019-04-23 DIAGNOSIS — Z79899 Other long term (current) drug therapy: Secondary | ICD-10-CM | POA: Diagnosis not present

## 2019-04-23 DIAGNOSIS — R197 Diarrhea, unspecified: Secondary | ICD-10-CM | POA: Diagnosis not present

## 2019-04-23 DIAGNOSIS — G479 Sleep disorder, unspecified: Secondary | ICD-10-CM | POA: Diagnosis not present

## 2019-04-23 DIAGNOSIS — C773 Secondary and unspecified malignant neoplasm of axilla and upper limb lymph nodes: Secondary | ICD-10-CM | POA: Diagnosis not present

## 2019-04-23 DIAGNOSIS — Z825 Family history of asthma and other chronic lower respiratory diseases: Secondary | ICD-10-CM | POA: Diagnosis not present

## 2019-04-23 DIAGNOSIS — C7800 Secondary malignant neoplasm of unspecified lung: Secondary | ICD-10-CM | POA: Diagnosis not present

## 2019-04-23 LAB — CBC WITH DIFFERENTIAL/PLATELET
Abs Immature Granulocytes: 0.01 10*3/uL (ref 0.00–0.07)
Basophils Absolute: 0.1 10*3/uL (ref 0.0–0.1)
Basophils Relative: 1 %
Eosinophils Absolute: 0.3 10*3/uL (ref 0.0–0.5)
Eosinophils Relative: 6 %
HCT: 35.8 % — ABNORMAL LOW (ref 36.0–46.0)
Hemoglobin: 11.2 g/dL — ABNORMAL LOW (ref 12.0–15.0)
Immature Granulocytes: 0 %
Lymphocytes Relative: 21 %
Lymphs Abs: 1.1 10*3/uL (ref 0.7–4.0)
MCH: 29.5 pg (ref 26.0–34.0)
MCHC: 31.3 g/dL (ref 30.0–36.0)
MCV: 94.2 fL (ref 80.0–100.0)
Monocytes Absolute: 0.4 10*3/uL (ref 0.1–1.0)
Monocytes Relative: 8 %
Neutro Abs: 3.3 10*3/uL (ref 1.7–7.7)
Neutrophils Relative %: 64 %
Platelets: 247 10*3/uL (ref 150–400)
RBC: 3.8 MIL/uL — ABNORMAL LOW (ref 3.87–5.11)
RDW: 13.4 % (ref 11.5–15.5)
WBC: 5.2 10*3/uL (ref 4.0–10.5)
nRBC: 0 % (ref 0.0–0.2)

## 2019-04-23 LAB — COMPREHENSIVE METABOLIC PANEL
ALT: 22 U/L (ref 0–44)
AST: 29 U/L (ref 15–41)
Albumin: 3.7 g/dL (ref 3.5–5.0)
Alkaline Phosphatase: 57 U/L (ref 38–126)
Anion gap: 7 (ref 5–15)
BUN: 32 mg/dL — ABNORMAL HIGH (ref 8–23)
CO2: 24 mmol/L (ref 22–32)
Calcium: 9 mg/dL (ref 8.9–10.3)
Chloride: 107 mmol/L (ref 98–111)
Creatinine, Ser: 1.39 mg/dL — ABNORMAL HIGH (ref 0.44–1.00)
GFR calc Af Amer: 38 mL/min — ABNORMAL LOW (ref >60)
GFR calc non Af Amer: 33 mL/min — ABNORMAL LOW (ref >60)
Glucose, Bld: 133 mg/dL — ABNORMAL HIGH (ref 70–99)
Potassium: 4.1 mmol/L (ref 3.5–5.1)
Sodium: 138 mmol/L (ref 135–145)
Total Bilirubin: 0.8 mg/dL (ref 0.3–1.2)
Total Protein: 6.4 g/dL — ABNORMAL LOW (ref 6.5–8.1)

## 2019-04-23 MED ORDER — PROCHLORPERAZINE MALEATE 10 MG PO TABS
ORAL_TABLET | ORAL | Status: AC
  Filled 2019-04-23: qty 1

## 2019-04-23 MED ORDER — ACETAMINOPHEN 325 MG PO TABS
ORAL_TABLET | ORAL | Status: AC
  Filled 2019-04-23: qty 2

## 2019-04-23 MED ORDER — SODIUM CHLORIDE 0.9 % IV SOLN
3.0000 mg/kg | Freq: Once | INTRAVENOUS | Status: AC
  Administered 2019-04-23: 10:00:00 180 mg via INTRAVENOUS
  Filled 2019-04-23: qty 5

## 2019-04-23 MED ORDER — PROCHLORPERAZINE MALEATE 10 MG PO TABS
10.0000 mg | ORAL_TABLET | Freq: Four times a day (QID) | ORAL | Status: DC | PRN
  Administered 2019-04-23: 10 mg via ORAL

## 2019-04-23 MED ORDER — DIPHENHYDRAMINE HCL 25 MG PO CAPS
ORAL_CAPSULE | ORAL | Status: AC
  Filled 2019-04-23: qty 1

## 2019-04-23 MED ORDER — SODIUM CHLORIDE 0.9 % IV SOLN
Freq: Once | INTRAVENOUS | Status: AC
  Administered 2019-04-23: 09:00:00 via INTRAVENOUS

## 2019-04-23 MED ORDER — DIPHENHYDRAMINE HCL 25 MG PO CAPS
25.0000 mg | ORAL_CAPSULE | Freq: Once | ORAL | Status: AC
  Administered 2019-04-23: 25 mg via ORAL

## 2019-04-23 MED ORDER — HEPARIN SOD (PORK) LOCK FLUSH 100 UNIT/ML IV SOLN
500.0000 [IU] | Freq: Once | INTRAVENOUS | Status: AC | PRN
  Administered 2019-04-23: 500 [IU]

## 2019-04-23 MED ORDER — SODIUM CHLORIDE 0.9% FLUSH
10.0000 mL | INTRAVENOUS | Status: DC | PRN
  Administered 2019-04-23: 10 mL
  Filled 2019-04-23: qty 10

## 2019-04-23 MED ORDER — ACETAMINOPHEN 325 MG PO TABS
650.0000 mg | ORAL_TABLET | Freq: Once | ORAL | Status: AC
  Administered 2019-04-23: 09:00:00 650 mg via ORAL

## 2019-04-23 NOTE — Progress Notes (Signed)
[place], [place] [address]   CLINIC:  Medical Oncology/Hematology  PCP:  [name], MD [place] [id number] [phone]   REASON FOR VISIT:  Follow-up for Breast Cancer   CURRENT THERAPY: Kadcyla.  BRIEF ONCOLOGIC HISTORY:  Oncology History  Invasive ductal carcinoma of left breast ([place])  03/30/2017 Initial Diagnosis   Invasive ductal carcinoma of left breast ([place])   08/09/2018 - 03/20/2019 Chemotherapy   The patient had trastuzumab (HERCEPTIN) 500 mg in sodium chloride 0.9 % 250 mL chemo infusion, 525 mg, Intravenous,  Once, 10 of 10 cycles Administration: 500 mg (08/09/2018), 350 mg (09/06/2018), 350 mg (09/27/2018), 378 mg (10/18/2018), 378 mg (11/15/2018), 378 mg (12/06/2018), 378 mg (12/28/2018), 378 mg (01/18/2019), 378 mg (02/08/2019) pertuzumab (PERJETA) 840 mg in sodium chloride 0.9 % 250 mL chemo infusion, 840 mg (100 % of original dose 840 mg), Intravenous, Once, 6 of 6 cycles Dose modification: 840 mg (original dose 840 mg, Cycle 5), 420 mg (original dose 420 mg, Cycle 6) Administration: 840 mg (11/15/2018), 420 mg (12/06/2018), 420 mg (12/28/2018), 420 mg (01/18/2019), 420 mg (02/08/2019)  for chemotherapy treatment.    09/18/2018 Genetic Testing   Negative genetic testing for the breast and gyn cancer panel.  The Breast/GYN gene panel offered by [facility] includes sequencing and rearrangement analysis for the following 23 genes:  ATM, BRCA1, BRCA2, BRIP1, CDH1, CHEK2, EPCAM, MLH1, MSH2, MSH6, NBN, NF1, PALB2, PMS2, PTEN, RAD51C, RAD51D, STK11, and TP53.   The report date is 09/18/2018.   03/12/2019 -  Chemotherapy   The patient had ado-trastuzumab emtansine (KADCYLA) 140 mg in sodium chloride 0.9 % 250 mL chemo infusion, 2.4 mg/kg = 140 mg (100 % of original dose 2.4 mg/kg), Intravenous, Once, 3 of 5 cycles Dose modification: 2.4 mg/kg (original dose 2.4 mg/kg, Cycle 1, Reason: Patient Age), 3 mg/kg (original dose 2.4  mg/kg, Cycle 3, Reason: Provider Judgment) Administration: 140 mg (03/12/2019), 180 mg (04/02/2019)  for chemotherapy treatment.       CANCER STAGING: Cancer Staging No matching staging information was found for the patient.   INTERVAL HISTORY:  Ms. Hanssen 85 y.o. female above metastatic HER-2 positive breast cancer.  Last dose of Kadcyla was on 04/02/2019.  Appetite and energy levels are 50%.  Mild fatigue was reported.  Did not feel any extra fatigue since we increase the dose.  She had diarrhea for which Imodium is effective.  She usually takes 2 pills of Imodium in the morning.  Denies any nausea or vomiting.  No symptoms of PND or orthopnea.  She had a echocardiogram done last week.  Denies any fevers or night sweats.  She reported 3 days of pain in her left leg which subsided spontaneously.   REVIEW OF SYSTEMS:  Review of Systems  Gastrointestinal: Positive for diarrhea.  Psychiatric/Behavioral: Positive for sleep disturbance.  All other systems reviewed and are negative.    PAST MEDICAL/SURGICAL HISTORY:  Past Medical History:  Diagnosis Date  . Cancer ([place])    left breast  . History of kidney stones   . Hypercholesteremia   . Hypertension    Past Surgical History:  Procedure Laterality Date  . APPENDECTOMY    . brain cyst removed  2011  . BREAST BIOPSY Left 03/28/2018   Procedure: BREAST BIOPSY;  Surgeon: [name], MD;  Location: [facility];  Service: General;  Laterality: Left;  . CATARACT EXTRACTION W/PHACO Left 11/09/2015   Procedure: CATARACT EXTRACTION PHACO AND  INTRAOCULAR LENS PLACEMENT LEFT EYE cde=8.97;  Surgeon: [name], MD;  Location: [facility];  Service: Ophthalmology;  Laterality: Left;  . CATARACT EXTRACTION W/PHACO Right 02/04/2019   Procedure: CATARACT EXTRACTION PHACO AND INTRAOCULAR LENS PLACEMENT (IOC);  Surgeon: [name], MD;  Location: [facility];  Service: Ophthalmology;  Laterality: Right;  CDE: 15.19  . EYE SURGERY     KPE left  . MASTECTOMY  MODIFIED RADICAL Left 05/28/2018   Procedure: LEFT MODIFIED RADICAL MASTECTOMY;  Surgeon: [name], MD;  Location: [facility];  Service: General;  Laterality: Left;  Marland Kitchen MASTECTOMY, PARTIAL Left 04/24/2017   Procedure: MASTECTOMY PARTIAL;  Surgeon: [name], MD;  Location: [facility];  Service: General;  Laterality: Left;  . PORTACATH PLACEMENT Right 08/08/2018   Procedure: INSERTION PORT-A-CATH;  Surgeon: [name], MD;  Location: [facility];  Service: General;  Laterality: Right;  . TONSILLECTOMY    . TONSILLECTOMY AND ADENOIDECTOMY       SOCIAL HISTORY:  Social History   Socioeconomic History  . Marital status: Widowed    Spouse name: Not on file  . Number of children: Not on file  . Years of education: Not on file  . Highest education level: Not on file  Occupational History  . Not on file  Social Needs  . Financial resource strain: Not hard at all  . Food insecurity    Worry: Never true    Inability: Never true  . Transportation needs    Medical: No    Non-medical: No  Tobacco Use  . Smoking status: Never Smoker  . Smokeless tobacco: Never Used  Substance and Sexual Activity  . Alcohol use: No  . Drug use: No  . Sexual activity: Not Currently    Partners: Male  Lifestyle  . Physical activity    Days per week: Patient refused    Minutes per session: Patient refused  . Stress: Not at all  Relationships  . Social Herbalist on phone: Patient refused    Gets together: Patient refused    Attends religious service: Patient refused    Active member of club or organization: Patient refused    Attends meetings of clubs or organizations: Patient refused    Relationship status: Patient refused  . Intimate partner violence    Fear of current or ex partner: Patient refused    Emotionally abused: Patient refused    Physically abused: Patient refused    Forced sexual activity: Patient refused  Other Topics Concern  . Not on file  Social History Narrative  .  Not on file    FAMILY HISTORY:  Family History  Problem Relation Age of Onset  . Heart failure Mother   . Heart failure Father   . Congestive Heart Failure Brother   . Tuberculosis Paternal Uncle   . Tuberculosis Maternal Grandmother   . Tuberculosis Maternal Grandfather   . Congestive Heart Failure Brother     CURRENT MEDICATIONS:  Outpatient Encounter Medications as of 04/23/2019  Medication Sig  . Ado-Trastuzumab Emtansine (KADCYLA IV) Inject into the vein every 21 ( twenty-one) days.  Marland Kitchen amLODipine (NORVASC) 5 MG tablet Take 5 mg by mouth daily.  Marland Kitchen aspirin EC 81 MG tablet Take 81 mg by mouth 2 (two) times a day.  . ferrous sulfate 325 (65 FE) MG tablet Take 325 mg by mouth daily with breakfast.  . labetalol (NORMODYNE) 100 MG tablet Take 1 tablet (100 mg total) by mouth 2 (two) times daily. (Patient  taking differently: Take 100 mg by mouth daily. )  . lidocaine-prilocaine (EMLA) cream Apply 1 application topically as needed (port access).  . LORazepam (ATIVAN) 1 MG tablet Take 1 mg by mouth daily.   . naproxen sodium (ALEVE) 220 MG tablet Take 220 mg by mouth daily as needed (for headache or pain).  Marland Kitchen oxybutynin (DITROPAN) 5 MG tablet Take 5 mg by mouth daily.   . simvastatin (ZOCOR) 40 MG tablet Take 40 mg by mouth daily.  . [DISCONTINUED] prochlorperazine (COMPAZINE) 10 MG tablet Take 1 tablet (10 mg total) by mouth every 6 (six) hours as needed (Nausea or vomiting). (Patient taking differently: Take 10 mg by mouth daily. )   No facility-administered encounter medications on file as of 04/23/2019.     ALLERGIES:  No Known Allergies   PHYSICAL EXAM:  ECOG Performance status: 1  Vitals:   04/23/19 0802  BP: 133/66  Pulse: 67  Resp: 18  Temp: (!) 96 F (35.6 C)  SpO2: 97%   Filed Weights   04/23/19 0802  Weight: 124 lb 9.6 oz (56.5 kg)    Physical Exam Constitutional:      Appearance: Normal appearance.  HENT:     Head: Normocephalic.     Nose: Nose normal.   Eyes:     Conjunctiva/sclera: Conjunctivae normal.  Neck:     Musculoskeletal: Normal range of motion.  Cardiovascular:     Rate and Rhythm: Normal rate and regular rhythm.     Pulses: Normal pulses.     Heart sounds: Normal heart sounds.  Pulmonary:     Effort: Pulmonary effort is normal.     Breath sounds: Normal breath sounds.  Abdominal:     General: Bowel sounds are normal.  Musculoskeletal: Normal range of motion.  Skin:    General: Skin is warm and dry.  Neurological:     General: No focal deficit present.     Mental Status: She is alert and oriented to person, place, and time.  Psychiatric:        Mood and Affect: Mood normal.        Behavior: Behavior normal.        Thought Content: Thought content normal.        Judgment: Judgment normal.      LABORATORY DATA:  I have reviewed the labs as listed.  CBC    Component Value Date/Time   WBC 5.2 04/23/2019 0748   RBC 3.80 (L) 04/23/2019 0748   HGB 11.2 (L) 04/23/2019 0748   HCT 35.8 (L) 04/23/2019 0748   PLT 247 04/23/2019 0748   MCV 94.2 04/23/2019 0748   MCH 29.5 04/23/2019 0748   MCHC 31.3 04/23/2019 0748   RDW 13.4 04/23/2019 0748   LYMPHSABS 1.1 04/23/2019 0748   MONOABS 0.4 04/23/2019 0748   EOSABS 0.3 04/23/2019 0748   BASOSABS 0.1 04/23/2019 0748   CMP Latest Ref Rng & Units 04/23/2019 04/02/2019 03/12/2019  Glucose 70 - 99 mg/dL 133(H) 111(H) 134(H)  BUN 8 - 23 mg/dL 32(H) 27(H) 26(H)  Creatinine 0.44 - 1.00 mg/dL 1.39(H) 1.47(H) 1.16(H)  Sodium 135 - 145 mmol/L 138 141 140  Potassium 3.5 - 5.1 mmol/L 4.1 4.8 4.5  Chloride 98 - 111 mmol/L 107 108 109  CO2 22 - 32 mmol/L _0 Calcium 8.9 - 10.3 mg/dL 9.0 9.0 8.9  Total Protein 6.5 - 8.1 g/dL 6.4(L) 6.1(L) 5.9(L)  Total Bilirubin 0.3 - 1.2 mg/dL 0.8 0.5 0.5  Alkaline Phos 38 -  126 U/L 57 51 54  AST 15 - 41 U/L _0 ALT 0 - 44 U/L _1 I have independently reviewed her PET CT scan and discussed with the patient.   ASSESSMENT &  PLAN:   Invasive ductal carcinoma of left breast ([place]) 1.  Metastatic HER-2 positive left breast cancer: -Left modified radical mastectomy on 05/28/2018, ER/PR negative, HER-2 positive, 3 out of 3 lymph nodes positive -PET scan on 07/23/2018 shows few scattered mildly hypermetabolic pulmonary nodules, hypermetabolic left axillary, right paratracheal, right hilar, subcarinal adenopathy compatible with metastatic disease. -4 cycles of single agent Herceptin every 3 weeks from 08/09/2018 through 10/18/2018 with progression. -Perjeta and Herceptin from 11/15/2018 through 02/08/2019 with PET scan on 02/25/2019 showing progression. -2 cycles of Kadcyla on 03/12/2019 (2.4 mg/KG) and 04/02/2019 (3 mg/KG). -She did not experience any further tiredness after dose increase last time.  We reviewed her labs.  She may proceed with cycle 3 with the same dose of 3 MCG/KG. -We will reevaluate her in 3 weeks.  2.  High risk drug monitoring: -Echocardiogram on 01/02/2019 with EF 60-65%. -Echocardiogram on 04/15/2019 with EF of 60-65%. -She denies any symptoms of PND or orthopnea.  Total time spent is 25 minutes with more than 50% of the time spent face-to-face discussing treatment plan, counseling and coordination of care.   Orders placed this encounter:  Orders Placed This Encounter  Procedures  . CBC with Differential/Platelet  . Comprehensive metabolic panel     Derek Jack, MD  [place] [phone]

## 2019-04-23 NOTE — Patient Instructions (Addendum)
[facility] at [facility] Discharge Instructions  You were seen today by Dr. Katragadda. He went over your recent lab results. He will see you back in 3 weeks for labs and follow up.   Thank you for choosing [facility] at [facility] to provide your oncology and hematology care.  To afford each patient quality time with our provider, please arrive at least 15 minutes before your scheduled appointment time.   If you have a lab appointment with the Cancer Center please come in thru the  Main Entrance and check in at the main information desk  You need to re-schedule your appointment should you arrive 10 or more minutes late.  We strive to give you quality time with our providers, and arriving late affects you and other patients whose appointments are after yours.  Also, if you no show three or more times for appointments you may be dismissed from the clinic at the providers discretion.     Again, thank you for choosing [facility].  Our hope is that these requests will decrease the amount of time that you wait before being seen by our physicians.       _____________________________________________________________  Should you have questions after your visit to  Cancer Center, please contact our office at [phone] between the hours of 8:00 a.m. and 4:30 p.m.  Voicemails left after 4:00 p.m. will not be returned until the following business day.  For prescription refill requests, have your pharmacy contact our office and allow 72 hours.    Cancer Center Support Programs:   > Cancer Support Group  2nd Tuesday of the month 1pm-2pm, Journey Room    

## 2019-04-23 NOTE — Progress Notes (Signed)
0900 Labs reviewed with and pt seen by Dr. Delton Coombes and pt approved for Kadcyla infusion today per MD                Herbert Pun Apple Grandpre tolerated Kadcyla infusion well without complaints or incident. VSS upon discharge. Pt discharged self ambulatory in satisfactory condition

## 2019-04-23 NOTE — Patient Instructions (Signed)
[facility] at [facility] Discharge Instructions  Labs drawn from portacath today   Thank you for choosing [facility] at [facility] to provide your oncology and hematology care.  To afford each patient quality time with our provider, please arrive at least 15 minutes before your scheduled appointment time.   If you have a lab appointment with the Cancer Center please come in thru the Main Entrance and check in at the main information desk.  You need to re-schedule your appointment should you arrive 10 or more minutes late.  We strive to give you quality time with our providers, and arriving late affects you and other patients whose appointments are after yours.  Also, if you no show three or more times for appointments you may be dismissed from the clinic at the providers discretion.     Again, thank you for choosing [facility].  Our hope is that these requests will decrease the amount of time that you wait before being seen by our physicians.       _____________________________________________________________  Should you have questions after your visit to [facility], please contact our office at [phone] between the hours of 8:00 a.m. and 4:30 p.m.  Voicemails left after 4:00 p.m. will not be returned until the following business day.  For prescription refill requests, have your pharmacy contact our office and allow 72 hours.    Due to Covid, you will need to wear a mask upon entering the hospital. If you do not have a mask, a mask will be given to you at the Main Entrance upon arrival. For doctor visits, patients may have 1 support person with them. For treatment visits, patients can not have anyone with them due to social distancing guidelines and our immunocompromised population.     

## 2019-04-23 NOTE — Patient Instructions (Signed)
[facility] Discharge Instructions for Patients Receiving Chemotherapy   Beginning January 23rd 2017 lab work for the Cancer Center will be done in the  Main lab at [facility] on 1st floor. If you have a lab appointment with the Cancer Center please come in thru the  Main Entrance and check in at the main information desk   Today you received the following chemotherapy agents Kadcyla. Follow-up as scheduled. Call clinic for any questions or concerns  To help prevent nausea and vomiting after your treatment, we encourage you to take your nausea medication   If you develop nausea and vomiting, or diarrhea that is not controlled by your medication, call the clinic.  The clinic phone number is [phone]. Office hours are Monday-Friday 8:30am-5:00pm.  BELOW ARE SYMPTOMS THAT SHOULD BE REPORTED IMMEDIATELY:  *FEVER GREATER THAN 101.0 F  *CHILLS WITH OR WITHOUT FEVER  NAUSEA AND VOMITING THAT IS NOT CONTROLLED WITH YOUR NAUSEA MEDICATION  *UNUSUAL SHORTNESS OF BREATH  *UNUSUAL BRUISING OR BLEEDING  TENDERNESS IN MOUTH AND THROAT WITH OR WITHOUT PRESENCE OF ULCERS  *URINARY PROBLEMS  *BOWEL PROBLEMS  UNUSUAL RASH Items with * indicate a potential emergency and should be followed up as soon as possible. If you have an emergency after office hours please contact your primary care physician or go to the nearest emergency department.  Please call the clinic during office hours if you have any questions or concerns.   You may also contact the Patient Navigator at [phone] should you have any questions or need assistance in obtaining follow up care.      Resources For Cancer Patients and their Caregivers ? American Cancer Society: Can assist with transportation, wigs, general needs, runs Look Good Feel Better.        1-888-227-6333 ? Cancer Care: Provides financial assistance, online support groups, medication/co-pay assistance.  1-800-813-HOPE  (4673) ? [facility] Assists [place] cancer patients and their families through emotional , educational and financial support.  336-427-4357 ? [place] DSS Where to apply for food stamps, Medicaid and utility assistance. 336-342-1394 ? [facility]: Transportation to medical appointments. 336-347-2287 ? Social Security Administration: May apply for disability if have a Stage IV cancer. 336-342-7796 1-800-772-1213 ? [place] Aging, Disability and Transit Services: Assists with nutrition, care and transit needs. 336-349-2343         

## 2019-04-23 NOTE — Assessment & Plan Note (Addendum)
1.  Metastatic HER-2 positive left breast cancer: -Left modified radical mastectomy on 05/28/2018, ER/PR negative, HER-2 positive, 3 out of 3 lymph nodes positive -PET scan on 07/23/2018 shows few scattered mildly hypermetabolic pulmonary nodules, hypermetabolic left axillary, right paratracheal, right hilar, subcarinal adenopathy compatible with metastatic disease. -4 cycles of single agent Herceptin every 3 weeks from 08/09/2018 through 10/18/2018 with progression. -Perjeta and Herceptin from 11/15/2018 through 02/08/2019 with PET scan on 02/25/2019 showing progression. -2 cycles of Kadcyla on 03/12/2019 (2.4 mg/KG) and 04/02/2019 (3 mg/KG). -She did not experience any further tiredness after dose increase last time.  We reviewed her labs.  She may proceed with cycle 3 with the same dose of 3 MCG/KG. -We will reevaluate her in 3 weeks.  2.  High risk drug monitoring: -Echocardiogram on 01/02/2019 with EF 60-65%. -Echocardiogram on 04/15/2019 with EF of 60-65%. -She denies any symptoms of PND or orthopnea.

## 2019-05-13 ENCOUNTER — Encounter (HOSPITAL_COMMUNITY): Payer: Self-pay | Admitting: *Deleted

## 2019-05-13 NOTE — Progress Notes (Signed)
Patient called the clinic stating that she has a decreased appetite, not much energy and hasn't been sleeping well. She also reports that since starting her "new chemo" she has had some difficulty with her left hip and left leg which has left her having to use a walker more often.  She states that she doesn't want [place] to do anything about this, reports that she has an appointment with [place] tomorrow and also with orthopedic on 9/25.  She just wanted to make [place] aware of her symptoms and said that she will tell the nurse tomorrow.

## 2019-05-14 ENCOUNTER — Other Ambulatory Visit: Payer: Self-pay

## 2019-05-14 ENCOUNTER — Inpatient Hospital Stay (HOSPITAL_COMMUNITY): Payer: Medicare Other

## 2019-05-14 ENCOUNTER — Encounter (HOSPITAL_COMMUNITY): Payer: Self-pay | Admitting: Hematology

## 2019-05-14 ENCOUNTER — Encounter (HOSPITAL_COMMUNITY): Payer: Self-pay

## 2019-05-14 ENCOUNTER — Inpatient Hospital Stay (HOSPITAL_BASED_OUTPATIENT_CLINIC_OR_DEPARTMENT_OTHER): Payer: Medicare Other | Admitting: Hematology

## 2019-05-14 ENCOUNTER — Inpatient Hospital Stay (HOSPITAL_COMMUNITY): Payer: Medicare Other | Attending: Hematology

## 2019-05-14 DIAGNOSIS — Z5112 Encounter for antineoplastic immunotherapy: Secondary | ICD-10-CM | POA: Insufficient documentation

## 2019-05-14 DIAGNOSIS — Z8249 Family history of ischemic heart disease and other diseases of the circulatory system: Secondary | ICD-10-CM | POA: Insufficient documentation

## 2019-05-14 DIAGNOSIS — C7800 Secondary malignant neoplasm of unspecified lung: Secondary | ICD-10-CM | POA: Diagnosis not present

## 2019-05-14 DIAGNOSIS — C50912 Malignant neoplasm of unspecified site of left female breast: Secondary | ICD-10-CM | POA: Diagnosis not present

## 2019-05-14 DIAGNOSIS — C778 Secondary and unspecified malignant neoplasm of lymph nodes of multiple regions: Secondary | ICD-10-CM | POA: Diagnosis not present

## 2019-05-14 DIAGNOSIS — M79605 Pain in left leg: Secondary | ICD-10-CM | POA: Diagnosis not present

## 2019-05-14 DIAGNOSIS — R110 Nausea: Secondary | ICD-10-CM | POA: Diagnosis not present

## 2019-05-14 DIAGNOSIS — G479 Sleep disorder, unspecified: Secondary | ICD-10-CM | POA: Diagnosis not present

## 2019-05-14 DIAGNOSIS — Z87442 Personal history of urinary calculi: Secondary | ICD-10-CM | POA: Insufficient documentation

## 2019-05-14 DIAGNOSIS — Z836 Family history of other diseases of the respiratory system: Secondary | ICD-10-CM | POA: Diagnosis not present

## 2019-05-14 DIAGNOSIS — Z170 Estrogen receptor positive status [ER+]: Secondary | ICD-10-CM | POA: Insufficient documentation

## 2019-05-14 DIAGNOSIS — C50512 Malignant neoplasm of lower-outer quadrant of left female breast: Secondary | ICD-10-CM | POA: Diagnosis not present

## 2019-05-14 DIAGNOSIS — Z79899 Other long term (current) drug therapy: Secondary | ICD-10-CM | POA: Insufficient documentation

## 2019-05-14 DIAGNOSIS — Z9221 Personal history of antineoplastic chemotherapy: Secondary | ICD-10-CM | POA: Diagnosis not present

## 2019-05-14 LAB — CBC WITH DIFFERENTIAL/PLATELET
Abs Immature Granulocytes: 0.01 10*3/uL (ref 0.00–0.07)
Basophils Absolute: 0.1 10*3/uL (ref 0.0–0.1)
Basophils Relative: 1 %
Eosinophils Absolute: 0.3 10*3/uL (ref 0.0–0.5)
Eosinophils Relative: 7 %
HCT: 34.5 % — ABNORMAL LOW (ref 36.0–46.0)
Hemoglobin: 10.8 g/dL — ABNORMAL LOW (ref 12.0–15.0)
Immature Granulocytes: 0 %
Lymphocytes Relative: 26 %
Lymphs Abs: 1 10*3/uL (ref 0.7–4.0)
MCH: 29.9 pg (ref 26.0–34.0)
MCHC: 31.3 g/dL (ref 30.0–36.0)
MCV: 95.6 fL (ref 80.0–100.0)
Monocytes Absolute: 0.3 10*3/uL (ref 0.1–1.0)
Monocytes Relative: 9 %
Neutro Abs: 2.1 10*3/uL (ref 1.7–7.7)
Neutrophils Relative %: 57 %
Platelets: 208 10*3/uL (ref 150–400)
RBC: 3.61 MIL/uL — ABNORMAL LOW (ref 3.87–5.11)
RDW: 13.7 % (ref 11.5–15.5)
WBC: 3.7 10*3/uL — ABNORMAL LOW (ref 4.0–10.5)
nRBC: 0 % (ref 0.0–0.2)

## 2019-05-14 LAB — COMPREHENSIVE METABOLIC PANEL
ALT: 28 U/L (ref 0–44)
AST: 34 U/L (ref 15–41)
Albumin: 3.5 g/dL (ref 3.5–5.0)
Alkaline Phosphatase: 53 U/L (ref 38–126)
Anion gap: 10 (ref 5–15)
BUN: 25 mg/dL — ABNORMAL HIGH (ref 8–23)
CO2: 22 mmol/L (ref 22–32)
Calcium: 8.9 mg/dL (ref 8.9–10.3)
Chloride: 108 mmol/L (ref 98–111)
Creatinine, Ser: 1.3 mg/dL — ABNORMAL HIGH (ref 0.44–1.00)
GFR calc Af Amer: 41 mL/min — ABNORMAL LOW (ref >60)
GFR calc non Af Amer: 36 mL/min — ABNORMAL LOW (ref >60)
Glucose, Bld: 166 mg/dL — ABNORMAL HIGH (ref 70–99)
Potassium: 4 mmol/L (ref 3.5–5.1)
Sodium: 140 mmol/L (ref 135–145)
Total Bilirubin: 0.8 mg/dL (ref 0.3–1.2)
Total Protein: 6.1 g/dL — ABNORMAL LOW (ref 6.5–8.1)

## 2019-05-14 MED ORDER — HEPARIN SOD (PORK) LOCK FLUSH 100 UNIT/ML IV SOLN
500.0000 [IU] | Freq: Once | INTRAVENOUS | Status: AC
  Administered 2019-05-14: 09:00:00 500 [IU] via INTRAVENOUS

## 2019-05-14 MED ORDER — SODIUM CHLORIDE 0.9% FLUSH
10.0000 mL | Freq: Once | INTRAVENOUS | Status: AC
  Administered 2019-05-14: 10 mL via INTRAVENOUS

## 2019-05-14 NOTE — Patient Instructions (Addendum)
[facility] at [facility] Discharge Instructions  You were seen today by Dr. Katragadda. He went over your recent lab results. He will see you back in 1 week for labs, treatment and follow up.   Thank you for choosing [facility] at [facility] to provide your oncology and hematology care.  To afford each patient quality time with our provider, please arrive at least 15 minutes before your scheduled appointment time.   If you have a lab appointment with the Cancer Center please come in thru the  Main Entrance and check in at the main information desk  You need to re-schedule your appointment should you arrive 10 or more minutes late.  We strive to give you quality time with our providers, and arriving late affects you and other patients whose appointments are after yours.  Also, if you no show three or more times for appointments you may be dismissed from the clinic at the providers discretion.     Again, thank you for choosing [facility].  Our hope is that these requests will decrease the amount of time that you wait before being seen by our physicians.       _____________________________________________________________  Should you have questions after your visit to [facility], please contact our office at [phone] between the hours of 8:00 a.m. and 4:30 p.m.  Voicemails left after 4:00 p.m. will not be returned until the following business day.  For prescription refill requests, have your pharmacy contact our office and allow 72 hours.    Cancer Center Support Programs:   > Cancer Support Group  2nd Tuesday of the month 1pm-2pm, Journey Room    

## 2019-05-14 NOTE — Assessment & Plan Note (Signed)
1.  Metastatic HER-2 positive left breast cancer: -Left modified radical mastectomy on 05/28/2018, ER/PR negative, HER-2 positive, 3 out of 3 lymph nodes positive -PET scan on 07/23/2018 shows few scattered mildly hypermetabolic pulmonary nodules, hypermetabolic left axillary, right paratracheal, right hilar, subcarinal adenopathy compatible with metastatic disease. -4 cycles of single agent Herceptin every 3 weeks from 08/09/2018 through 10/18/2018 with progression. -Perjeta and Herceptin from 11/15/2018 through 02/08/2019 with PET scan on 02/25/2019 showing progression. -3 cycles of Kadcyla from 03/12/2019 through 04/23/2019 at 3 mg/kg. -She has noticed pain in her left leg in the last 2 to 3 weeks.  She cannot put weight on it.  She is having to use a walker mainly in the mornings. -She is seeing Dr. [name] this Friday. -I have reviewed her blood work.  I will hold off on her chemotherapy today.  We will reevaluate her in 1 week.  2.  High risk drug monitoring: -Echocardiogram on 04/15/2019 shows EF of 60 to 65%.  She denies any symptoms of PND or orthopnea.

## 2019-05-14 NOTE — Progress Notes (Signed)
[name], [name] [address]   CLINIC:  Medical Oncology/Hematology  PCP:  [name], [name] [id number] [phone]   REASON FOR VISIT:  Follow-up for Breast Cancer   CURRENT THERAPY: Kadcyla.  BRIEF ONCOLOGIC HISTORY:  Oncology History  Invasive ductal carcinoma of left breast ([place])  03/30/2017 Initial Diagnosis   Invasive ductal carcinoma of left breast ([place])   08/09/2018 - 03/20/2019 Chemotherapy   The patient had trastuzumab (HERCEPTIN) 500 mg in sodium chloride 0.9 % 250 mL chemo infusion, 525 mg, Intravenous,  Once, 10 of 10 cycles Administration: 500 mg (08/09/2018), 350 mg (09/06/2018), 350 mg (09/27/2018), 378 mg (10/18/2018), 378 mg (11/15/2018), 378 mg (12/06/2018), 378 mg (12/28/2018), 378 mg (01/18/2019), 378 mg (02/08/2019) pertuzumab (PERJETA) 840 mg in sodium chloride 0.9 % 250 mL chemo infusion, 840 mg (100 % of original dose 840 mg), Intravenous, Once, 6 of 6 cycles Dose modification: 840 mg (original dose 840 mg, Cycle 5), 420 mg (original dose 420 mg, Cycle 6) Administration: 840 mg (11/15/2018), 420 mg (12/06/2018), 420 mg (12/28/2018), 420 mg (01/18/2019), 420 mg (02/08/2019)  for chemotherapy treatment.    09/18/2018 Genetic Testing   Negative genetic testing for the breast and gyn cancer panel.  The Breast/GYN gene panel offered by [facility] includes sequencing and rearrangement analysis for the following 23 genes:  ATM, BRCA1, BRCA2, BRIP1, CDH1, CHEK2, EPCAM, MLH1, MSH2, MSH6, NBN, NF1, PALB2, PMS2, PTEN, RAD51C, RAD51D, STK11, and TP53.   The report date is 09/18/2018.   03/12/2019 -  Chemotherapy   The patient had ado-trastuzumab emtansine (KADCYLA) 140 mg in sodium chloride 0.9 % 250 mL chemo infusion, 2.4 mg/kg = 140 mg (100 % of original dose 2.4 mg/kg), Intravenous, Once, 3 of 5 cycles Dose modification: 2.4 mg/kg (original dose 2.4 mg/kg, Cycle 1, Reason: Patient Age), 3 mg/kg (original dose 2.4  mg/kg, Cycle 3, Reason: Provider Judgment) Administration: 140 mg (03/12/2019), 180 mg (04/23/2019), 180 mg (04/02/2019)  for chemotherapy treatment.       CANCER STAGING: Cancer Staging No matching staging information was found for the patient.   INTERVAL HISTORY:  Carol Duarte 85 y.o. female seen for metastatic HER-2 positive breast cancer.  Last treatment with Kadcyla was on 04/23/2019.  She reported left leg pain in the last 2 to 3 weeks.  She is not able to weight-bear on it.  She does have to use a walker in the mornings.  The pain gradually gets better during the day.  She is seeing Dr. [name] this Friday.  She did have occasional nausea which was controlled with Compazine.  She also reported some sleep issues.  Appetite is 50%.  Energy levels are 25%.   REVIEW OF SYSTEMS:  Review of Systems  Gastrointestinal: Positive for nausea.  Psychiatric/Behavioral: Positive for sleep disturbance.  All other systems reviewed and are negative.    PAST MEDICAL/SURGICAL HISTORY:  Past Medical History:  Diagnosis Date  . Cancer ([facility])    left breast  . History of kidney stones   . Hypercholesteremia   . Hypertension    Past Surgical History:  Procedure Laterality Date  . APPENDECTOMY    . brain cyst removed  2011  . BREAST BIOPSY Left 03/28/2018   Procedure: BREAST BIOPSY;  Surgeon: [name], MD;  Location: [facility];  Service: General;  Laterality: Left;  . CATARACT EXTRACTION W/PHACO Left 11/09/2015   Procedure: CATARACT EXTRACTION PHACO AND INTRAOCULAR LENS PLACEMENT LEFT EYE  cde=8.97;  Surgeon: [name], MD;  Location: [facility];  Service: Ophthalmology;  Laterality: Left;  . CATARACT EXTRACTION W/PHACO Right 02/04/2019   Procedure: CATARACT EXTRACTION PHACO AND INTRAOCULAR LENS PLACEMENT (IOC);  Surgeon: [name], MD;  Location: [facility];  Service: Ophthalmology;  Laterality: Right;  CDE: 15.19  . EYE SURGERY     KPE left  . MASTECTOMY MODIFIED RADICAL Left 05/28/2018   Procedure:  LEFT MODIFIED RADICAL MASTECTOMY;  Surgeon: [name], MD;  Location: [facility];  Service: General;  Laterality: Left;  Marland Kitchen MASTECTOMY, PARTIAL Left 04/24/2017   Procedure: MASTECTOMY PARTIAL;  Surgeon: [name], MD;  Location: [facility];  Service: General;  Laterality: Left;  . PORTACATH PLACEMENT Right 08/08/2018   Procedure: INSERTION PORT-A-CATH;  Surgeon: [name], MD;  Location: [facility];  Service: General;  Laterality: Right;  . TONSILLECTOMY    . TONSILLECTOMY AND ADENOIDECTOMY       SOCIAL HISTORY:  Social History   Socioeconomic History  . Marital status: Widowed    Spouse name: Not on file  . Number of children: Not on file  . Years of education: Not on file  . Highest education level: Not on file  Occupational History  . Not on file  Social Needs  . Financial resource strain: Not hard at all  . Food insecurity    Worry: Never true    Inability: Never true  . Transportation needs    Medical: No    Non-medical: No  Tobacco Use  . Smoking status: Never Smoker  . Smokeless tobacco: Never Used  Substance and Sexual Activity  . Alcohol use: No  . Drug use: No  . Sexual activity: Not Currently    Partners: Male  Lifestyle  . Physical activity    Days per week: Patient refused    Minutes per session: Patient refused  . Stress: Not at all  Relationships  . Social Herbalist on phone: Patient refused    Gets together: Patient refused    Attends religious service: Patient refused    Active member of club or organization: Patient refused    Attends meetings of clubs or organizations: Patient refused    Relationship status: Patient refused  . Intimate partner violence    Fear of current or ex partner: Patient refused    Emotionally abused: Patient refused    Physically abused: Patient refused    Forced sexual activity: Patient refused  Other Topics Concern  . Not on file  Social History Narrative  . Not on file    FAMILY HISTORY:  Family  History  Problem Relation Age of Onset  . Heart failure Mother   . Heart failure Father   . Congestive Heart Failure Brother   . Tuberculosis Paternal Uncle   . Tuberculosis Maternal Grandmother   . Tuberculosis Maternal Grandfather   . Congestive Heart Failure Brother     CURRENT MEDICATIONS:  Outpatient Encounter Medications as of 05/14/2019  Medication Sig  . Ado-Trastuzumab Emtansine (KADCYLA IV) Inject into the vein every 21 ( twenty-one) days.  Marland Kitchen amLODipine (NORVASC) 5 MG tablet Take 5 mg by mouth daily.  Marland Kitchen aspirin EC 81 MG tablet Take 81 mg by mouth 2 (two) times a day.  . ferrous sulfate 325 (65 FE) MG tablet Take 325 mg by mouth daily with breakfast.  . labetalol (NORMODYNE) 100 MG tablet Take 1 tablet (100 mg total) by mouth 2 (two) times daily. (Patient taking differently: Take 100 mg  by mouth daily. )  . lidocaine-prilocaine (EMLA) cream Apply 1 application topically as needed (port access).  . LORazepam (ATIVAN) 1 MG tablet Take 1 mg by mouth daily.   . naproxen sodium (ALEVE) 220 MG tablet Take 220 mg by mouth daily as needed (for headache or pain).  Marland Kitchen oxybutynin (DITROPAN) 5 MG tablet Take 5 mg by mouth daily.   . simvastatin (ZOCOR) 40 MG tablet Take 40 mg by mouth daily.  . [DISCONTINUED] prochlorperazine (COMPAZINE) 10 MG tablet Take 1 tablet (10 mg total) by mouth every 6 (six) hours as needed (Nausea or vomiting). (Patient taking differently: Take 10 mg by mouth daily. )   No facility-administered encounter medications on file as of 05/14/2019.     ALLERGIES:  No Known Allergies   PHYSICAL EXAM:  ECOG Performance status: 1  Vitals:   05/14/19 0758  BP: 137/67  Pulse: (!) 51  Resp: 18  Temp: (!) 96.4 F (35.8 C)  SpO2: 99%   Filed Weights   05/14/19 0758  Weight: 124 lb (56.2 kg)    Physical Exam Constitutional:      Appearance: Normal appearance.  HENT:     Head: Normocephalic.     Nose: Nose normal.  Eyes:     Conjunctiva/sclera:  Conjunctivae normal.  Neck:     Musculoskeletal: Normal range of motion.  Cardiovascular:     Rate and Rhythm: Normal rate and regular rhythm.     Pulses: Normal pulses.     Heart sounds: Normal heart sounds.  Pulmonary:     Effort: Pulmonary effort is normal.     Breath sounds: Normal breath sounds.  Abdominal:     General: Bowel sounds are normal.  Musculoskeletal: Normal range of motion.  Skin:    General: Skin is warm and dry.  Neurological:     General: No focal deficit present.     Mental Status: She is alert and oriented to person, place, and time.  Psychiatric:        Mood and Affect: Mood normal.        Behavior: Behavior normal.        Thought Content: Thought content normal.        Judgment: Judgment normal.      LABORATORY DATA:  I have reviewed the labs as listed.  CBC    Component Value Date/Time   WBC 3.7 (L) 05/14/2019 0755   RBC 3.61 (L) 05/14/2019 0755   HGB 10.8 (L) 05/14/2019 0755   HCT 34.5 (L) 05/14/2019 0755   PLT 208 05/14/2019 0755   MCV 95.6 05/14/2019 0755   MCH 29.9 05/14/2019 0755   MCHC 31.3 05/14/2019 0755   RDW 13.7 05/14/2019 0755   LYMPHSABS 1.0 05/14/2019 0755   MONOABS 0.3 05/14/2019 0755   EOSABS 0.3 05/14/2019 0755   BASOSABS 0.1 05/14/2019 0755   CMP Latest Ref Rng & Units 05/14/2019 04/23/2019 04/02/2019  Glucose 70 - 99 mg/dL 166(H) 133(H) 111(H)  BUN 8 - 23 mg/dL 25(H) 32(H) 27(H)  Creatinine 0.44 - 1.00 mg/dL 1.30(H) 1.39(H) 1.47(H)  Sodium 135 - 145 mmol/L 140 138 141  Potassium 3.5 - 5.1 mmol/L 4.0 4.1 4.8  Chloride 98 - 111 mmol/L 108 107 108  CO2 22 - 32 mmol/L _0 Calcium 8.9 - 10.3 mg/dL 8.9 9.0 9.0  Total Protein 6.5 - 8.1 g/dL 6.1(L) 6.4(L) 6.1(L)  Total Bilirubin 0.3 - 1.2 mg/dL 0.8 0.8 0.5  Alkaline Phos 38 - 126 U/L 53 57  51  AST 15 - 41 U/L 34 29 27  ALT 0 - 44 U/L _0 I have independently reviewed her PET CT scan and discussed with the patient.   ASSESSMENT & PLAN:   Invasive ductal  carcinoma of left breast ([place]) 1.  Metastatic HER-2 positive left breast cancer: -Left modified radical mastectomy on 05/28/2018, ER/PR negative, HER-2 positive, 3 out of 3 lymph nodes positive -PET scan on 07/23/2018 shows few scattered mildly hypermetabolic pulmonary nodules, hypermetabolic left axillary, right paratracheal, right hilar, subcarinal adenopathy compatible with metastatic disease. -4 cycles of single agent Herceptin every 3 weeks from 08/09/2018 through 10/18/2018 with progression. -Perjeta and Herceptin from 11/15/2018 through 02/08/2019 with PET scan on 02/25/2019 showing progression. -3 cycles of Kadcyla from 03/12/2019 through 04/23/2019 at 3 mg/kg. -She has noticed pain in her left leg in the last 2 to 3 weeks.  She cannot put weight on it.  She is having to use a walker mainly in the mornings. -She is seeing Dr. [name] this Friday. -I have reviewed her blood work.  I will hold off on her chemotherapy today.  We will reevaluate her in 1 week.  2.  High risk drug monitoring: -Echocardiogram on 04/15/2019 shows EF of 60 to 65%.  She denies any symptoms of PND or orthopnea.  Total time spent is 25 minutes with more than 50% of the time spent face-to-face discussing treatment plan, counseling and coordination of care.   Orders placed this encounter:  No orders of the defined types were placed in this encounter.    Derek Jack, MD  [place] [phone]

## 2019-05-14 NOTE — Progress Notes (Signed)
Patient here today for cycle 4. She is complaining of her left leg hurting since her last treatment. Patient states she has to use a walker for half of the day because she is having pain in this leg. She has an appointment with Dr. [name] the orthopedic doctor on Friday of this week.  MD made aware.    Will hold treatment today per MD.

## 2019-05-21 ENCOUNTER — Ambulatory Visit (HOSPITAL_COMMUNITY): Payer: Medicare Other | Admitting: Hematology

## 2019-05-21 ENCOUNTER — Other Ambulatory Visit (HOSPITAL_COMMUNITY): Payer: Medicare Other

## 2019-05-21 ENCOUNTER — Ambulatory Visit (HOSPITAL_COMMUNITY): Payer: Medicare Other

## 2019-05-24 ENCOUNTER — Encounter: Payer: Self-pay | Admitting: Orthopedic Surgery

## 2019-05-24 ENCOUNTER — Ambulatory Visit: Payer: Medicare Other

## 2019-05-24 ENCOUNTER — Other Ambulatory Visit: Payer: Self-pay

## 2019-05-24 ENCOUNTER — Ambulatory Visit (INDEPENDENT_AMBULATORY_CARE_PROVIDER_SITE_OTHER): Payer: Medicare Other | Admitting: Orthopedic Surgery

## 2019-05-24 VITALS — BP 130/79 | HR 53 | Temp 96.4°F | Ht 62.0 in | Wt 126.6 lb

## 2019-05-24 DIAGNOSIS — M5410 Radiculopathy, site unspecified: Secondary | ICD-10-CM

## 2019-05-24 DIAGNOSIS — G8929 Other chronic pain: Secondary | ICD-10-CM

## 2019-05-24 DIAGNOSIS — M5442 Lumbago with sciatica, left side: Secondary | ICD-10-CM

## 2019-05-24 MED ORDER — ALPRAZOLAM 0.5 MG PO TABS: 0.5000 mg | ORAL_TABLET | Freq: Every evening | ORAL | 0 refills | Status: DC | PRN

## 2019-05-24 NOTE — Addendum Note (Signed)
Addended byCandice Camp on: 05/24/2019 11:51 AM   Modules accepted: Orders

## 2019-05-24 NOTE — Progress Notes (Signed)
Carol Duarte  05/24/2019  HISTORY SECTION :  Chief Complaint  Patient presents with  . Hip Pain    left, on going for x 4 weeks  . Leg Pain    left    85 year old female presents for evaluation of recent onset of left hip pain and left leg pain.  She is undergoing cancer treatments at the cancer center and about 4 weeks ago she started a new medication and noted that since that time she has had to use a walker to ambulate whereas she was very active cooking dinner for her family 4 nights a week active doing her daily chores.  She does not have any numbness or tingling but complains of pain in the left hip that runs down the left leg associated with the weakness requiring the use of a walker.  This is especially difficult in the morning and getting out of a chair but as she moves around sometimes she can get rid of the walker.  Only treatment so far is Tylenol  Review of Systems  Eyes: Positive for photophobia and redness.  Musculoskeletal: Positive for joint pain.     has a past medical history of Cancer [facility]), History of kidney stones, Hypercholesteremia, and Hypertension.   Past Surgical History:  Procedure Laterality Date  . APPENDECTOMY    . brain cyst removed  2011  . BREAST BIOPSY Left 03/28/2018   Procedure: BREAST BIOPSY;  Surgeon: [name], MD;  Location: [facility];  Service: General;  Laterality: Left;  . CATARACT EXTRACTION W/PHACO Left 11/09/2015   Procedure: CATARACT EXTRACTION PHACO AND INTRAOCULAR LENS PLACEMENT LEFT EYE cde=8.97;  Surgeon: [name], MD;  Location: [facility];  Service: Ophthalmology;  Laterality: Left;  . CATARACT EXTRACTION W/PHACO Right 02/04/2019   Procedure: CATARACT EXTRACTION PHACO AND INTRAOCULAR LENS PLACEMENT (IOC);  Surgeon: [name], MD;  Location: [facility];  Service: Ophthalmology;  Laterality: Right;  CDE: 15.19  . EYE SURGERY     KPE left  . MASTECTOMY MODIFIED RADICAL Left 05/28/2018   Procedure: LEFT MODIFIED RADICAL MASTECTOMY;   Surgeon: [name], MD;  Location: [facility];  Service: General;  Laterality: Left;  Marland Kitchen MASTECTOMY, PARTIAL Left 04/24/2017   Procedure: MASTECTOMY PARTIAL;  Surgeon: [name], MD;  Location: [facility];  Service: General;  Laterality: Left;  . PORTACATH PLACEMENT Right 08/08/2018   Procedure: INSERTION PORT-A-CATH;  Surgeon: [name], MD;  Location: [facility];  Service: General;  Laterality: Right;  . TONSILLECTOMY    . TONSILLECTOMY AND ADENOIDECTOMY      Body mass index is 23.16 kg/m.   No Known Allergies   Current Outpatient Medications:  .  Ado-Trastuzumab Emtansine (KADCYLA IV), Inject into the vein every 21 ( twenty-one) days., Disp: , Rfl:  .  amLODipine (NORVASC) 5 MG tablet, Take 5 mg by mouth daily., Disp: , Rfl:  .  aspirin EC 81 MG tablet, Take 81 mg by mouth 2 (two) times a day., Disp: , Rfl:  .  ferrous sulfate 325 (65 FE) MG tablet, Take 325 mg by mouth daily with breakfast., Disp: , Rfl:  .  labetalol (NORMODYNE) 100 MG tablet, Take 1 tablet (100 mg total) by mouth 2 (two) times daily. (Patient taking differently: Take 100 mg by mouth daily. ), Disp: 60 tablet, Rfl: 12 .  lidocaine-prilocaine (EMLA) cream, Apply 1 application topically as needed (port access)., Disp: , Rfl:  .  LORazepam (ATIVAN) 1 MG tablet, Take 1 mg by mouth daily. , Disp: ,  Rfl:  .  naproxen sodium (ALEVE) 220 MG tablet, Take 220 mg by mouth daily as needed (for headache or pain)., Disp: , Rfl:  .  oxybutynin (DITROPAN) 5 MG tablet, Take 5 mg by mouth daily. , Disp: , Rfl:  .  simvastatin (ZOCOR) 40 MG tablet, Take 40 mg by mouth daily., Disp: , Rfl:    PHYSICAL EXAM SECTION: 1) BP 130/79   Pulse (!) 53   Temp (!) 96.4 F (35.8 C)   Ht 5\' 2"  (1.575 m)   Wt 126 lb 9.6 oz (57.4 kg)   BMI 23.16 kg/m   Body mass index is 23.16 kg/m. General appearance: Well-developed well-nourished no gross deformities  2) Cardiovascular normal pulse and perfusion in the lower extremities normal color  without edema  3) Neurologically deep tendon reflexes are equal and normal, no sensation loss or deficits no pathologic reflexes  4) Psychological: Awake alert and oriented x3 mood and affect normal  5) Skin no lacerations or ulcerations no nodularity no palpable masses, no erythema or nodularity  6) Musculoskeletal:   Spine nontender thoracic spine mild tenderness midline left side lumbar spine normal muscle tension skin normal  Normal range of motion left and right hip no pain with range of motion left right hip No tenderness in the left or right hip Proximal hip girdle normal strength bilaterally  Reflexes are 2+ and equal bilaterally  Straight leg raise is normal in each leg  No sensory deficits are noted   MEDICAL DECISION SECTION:  Encounter Diagnoses  Name Primary?  . Radicular pain of left lower extremity Yes  . Chronic left-sided low back pain with left-sided sciatica     Imaging X-ray was obtained lumbar spine note patient had recent PET scan June 29 which was normal however that was before the onset of this pain  X-ray shows degenerative scoliosis and lumbar spondylosis with kyphosis thoracic spine  Plan:  (Rx., Inj., surg., Frx, MRI/CT, XR:2)  The patient could have simple degenerative disc disease with spondylosis and foraminal stenosis causing left leg pain however, she is undergoing cancer treatment and will require MRI to image the lumbar spine    11:18 AM Arther Abbott, MD  05/24/2019

## 2019-05-24 NOTE — Patient Instructions (Addendum)
YOU CAN USE HEATING PAD AND TYLENOL FOR PAIN  Radicular Pain Radicular pain is a type of pain that spreads from your back or neck along a spinal nerve. Spinal nerves are nerves that leave the spinal cord and go to the muscles. Radicular pain is sometimes called radiculopathy, radiculitis, or a pinched nerve. When you have this type of pain, you may also have weakness, numbness, or tingling in the area of your body that is supplied by the nerve. The pain may feel sharp and burning. Depending on which spinal nerve is affected, the pain may occur in the:  Neck area (cervical radicular pain). You may also feel pain, numbness, weakness, or tingling in the arms.  Mid-spine area (thoracic radicular pain). You would feel this pain in the back and chest. This type is rare.  Lower back area (lumbar radicular pain). You would feel this pain as low back pain. You may feel pain, numbness, weakness, or tingling in the buttocks or legs. Sciatica is a type of lumbar radicular pain that shoots down the back of the leg. Radicular pain occurs when one of the spinal nerves becomes irritated or squeezed (compressed). It is often caused by something pushing on a spinal nerve, such as one of the bones of the spine (vertebrae) or one of the round cushions between vertebrae (intervertebral disks). This can result from:  An injury.  Wear and tear or aging of a disk.  The growth of a bone spur that pushes on the nerve. Radicular pain often goes away when you follow instructions from your health care provider for relieving pain at home. Follow these instructions at home: Managing pain      If directed, put ice on the affected area: ? Put ice in a plastic bag. ? Place a towel between your skin and the bag. ? Leave the ice on for 20 minutes, 2-3 times a day.  If directed, apply heat to the affected area as often as told by your health care provider. Use the heat source that your health care provider recommends, such  as a moist heat pack or a heating pad. ? Place a towel between your skin and the heat source. ? Leave the heat on for 20-30 minutes. ? Remove the heat if your skin turns bright red. This is especially important if you are unable to feel pain, heat, or cold. You may have a greater risk of getting burned. Activity   Do not sit or rest in bed for long periods of time.  Try to stay as active as possible. Ask your health care provider what type of exercise or activity is best for you.  Avoid activities that make your pain worse, such as bending and lifting.  Do not lift anything that is heavier than 10 lb (4.5 kg), or the limit that you are told, until your health care provider says that it is safe.  Practice using proper technique when lifting items. Proper lifting technique involves bending your knees and rising up.  Do strength and range-of-motion exercises only as told by your health care provider or physical therapist. General instructions  Take over-the-counter and prescription medicines only as told by your health care provider.  Pay attention to any changes in your symptoms.  Keep all follow-up visits as told by your health care provider. This is important. ? Your health care provider may send you to a physical therapist to help with this pain. Contact a health care provider if:  Your pain and  other symptoms get worse.  Your pain medicine is not helping.  Your pain has not improved after a few weeks of home care.  You have a fever. Get help right away if:  You have severe pain, weakness, or numbness.  You have difficulty with bladder or bowel control. Summary  Radicular pain is a type of pain that spreads from your back or neck along a spinal nerve.  When you have radicular pain, you may also have weakness, numbness, or tingling in the area of your body that is supplied by the nerve.  The pain may feel sharp or burning.  Radicular pain may be treated with ice, heat,  medicines, or physical therapy. This information is not intended to replace advice given to you by your health care provider. Make sure you discuss any questions you have with your health care provider. Document Released: 09/22/2004 Document Revised: 02/27/2018 Document Reviewed: 02/27/2018 Elsevier Patient Education  2020 [employer].

## 2019-05-24 NOTE — Addendum Note (Signed)
Addended by: Carole Civil on: 05/24/2019 11:41 AM   Modules accepted: Orders

## 2019-05-27 ENCOUNTER — Telehealth: Payer: Self-pay | Admitting: Radiology

## 2019-05-27 NOTE — Telephone Encounter (Signed)
I called patient she has a VP shunt need to know if she can have MRI Dr Redmond Pulling at [facility] did the surgery. I have sent a letter to Dr Redmond Pulling to see if he can advise.

## 2019-05-28 ENCOUNTER — Other Ambulatory Visit: Payer: Self-pay

## 2019-05-28 ENCOUNTER — Inpatient Hospital Stay (HOSPITAL_COMMUNITY): Payer: Medicare Other

## 2019-05-28 ENCOUNTER — Encounter (HOSPITAL_COMMUNITY): Payer: Self-pay | Admitting: Hematology

## 2019-05-28 ENCOUNTER — Inpatient Hospital Stay (HOSPITAL_BASED_OUTPATIENT_CLINIC_OR_DEPARTMENT_OTHER): Payer: Medicare Other | Admitting: Hematology

## 2019-05-28 VITALS — BP 135/50 | HR 52 | Temp 96.7°F | Resp 16 | Wt 124.0 lb

## 2019-05-28 VITALS — BP 100/48 | HR 77 | Temp 97.7°F | Resp 16

## 2019-05-28 DIAGNOSIS — Z8249 Family history of ischemic heart disease and other diseases of the circulatory system: Secondary | ICD-10-CM | POA: Diagnosis not present

## 2019-05-28 DIAGNOSIS — C50912 Malignant neoplasm of unspecified site of left female breast: Secondary | ICD-10-CM | POA: Diagnosis not present

## 2019-05-28 DIAGNOSIS — R110 Nausea: Secondary | ICD-10-CM | POA: Diagnosis not present

## 2019-05-28 DIAGNOSIS — Z5112 Encounter for antineoplastic immunotherapy: Secondary | ICD-10-CM | POA: Diagnosis not present

## 2019-05-28 DIAGNOSIS — C50512 Malignant neoplasm of lower-outer quadrant of left female breast: Secondary | ICD-10-CM | POA: Diagnosis not present

## 2019-05-28 DIAGNOSIS — Z79899 Other long term (current) drug therapy: Secondary | ICD-10-CM | POA: Diagnosis not present

## 2019-05-28 DIAGNOSIS — M79605 Pain in left leg: Secondary | ICD-10-CM | POA: Diagnosis not present

## 2019-05-28 DIAGNOSIS — C778 Secondary and unspecified malignant neoplasm of lymph nodes of multiple regions: Secondary | ICD-10-CM | POA: Diagnosis not present

## 2019-05-28 DIAGNOSIS — Z170 Estrogen receptor positive status [ER+]: Secondary | ICD-10-CM | POA: Diagnosis not present

## 2019-05-28 DIAGNOSIS — Z9221 Personal history of antineoplastic chemotherapy: Secondary | ICD-10-CM | POA: Diagnosis not present

## 2019-05-28 DIAGNOSIS — C7800 Secondary malignant neoplasm of unspecified lung: Secondary | ICD-10-CM | POA: Diagnosis not present

## 2019-05-28 DIAGNOSIS — Z836 Family history of other diseases of the respiratory system: Secondary | ICD-10-CM | POA: Diagnosis not present

## 2019-05-28 DIAGNOSIS — G479 Sleep disorder, unspecified: Secondary | ICD-10-CM | POA: Diagnosis not present

## 2019-05-28 DIAGNOSIS — Z87442 Personal history of urinary calculi: Secondary | ICD-10-CM | POA: Diagnosis not present

## 2019-05-28 LAB — COMPREHENSIVE METABOLIC PANEL
ALT: 23 U/L (ref 0–44)
AST: 27 U/L (ref 15–41)
Albumin: 3.5 g/dL (ref 3.5–5.0)
Alkaline Phosphatase: 54 U/L (ref 38–126)
Anion gap: 8 (ref 5–15)
BUN: 30 mg/dL — ABNORMAL HIGH (ref 8–23)
CO2: 25 mmol/L (ref 22–32)
Calcium: 9.2 mg/dL (ref 8.9–10.3)
Chloride: 109 mmol/L (ref 98–111)
Creatinine, Ser: 1.4 mg/dL — ABNORMAL HIGH (ref 0.44–1.00)
GFR calc Af Amer: 38 mL/min — ABNORMAL LOW (ref >60)
GFR calc non Af Amer: 33 mL/min — ABNORMAL LOW (ref >60)
Glucose, Bld: 146 mg/dL — ABNORMAL HIGH (ref 70–99)
Potassium: 4.2 mmol/L (ref 3.5–5.1)
Sodium: 142 mmol/L (ref 135–145)
Total Bilirubin: 0.7 mg/dL (ref 0.3–1.2)
Total Protein: 6.1 g/dL — ABNORMAL LOW (ref 6.5–8.1)

## 2019-05-28 LAB — CBC WITH DIFFERENTIAL/PLATELET
Abs Immature Granulocytes: 0 10*3/uL (ref 0.00–0.07)
Basophils Absolute: 0.1 10*3/uL (ref 0.0–0.1)
Basophils Relative: 1 %
Eosinophils Absolute: 0.3 10*3/uL (ref 0.0–0.5)
Eosinophils Relative: 6 %
HCT: 34 % — ABNORMAL LOW (ref 36.0–46.0)
Hemoglobin: 10.5 g/dL — ABNORMAL LOW (ref 12.0–15.0)
Immature Granulocytes: 0 %
Lymphocytes Relative: 23 %
Lymphs Abs: 0.9 10*3/uL (ref 0.7–4.0)
MCH: 29.8 pg (ref 26.0–34.0)
MCHC: 30.9 g/dL (ref 30.0–36.0)
MCV: 96.6 fL (ref 80.0–100.0)
Monocytes Absolute: 0.3 10*3/uL (ref 0.1–1.0)
Monocytes Relative: 8 %
Neutro Abs: 2.5 10*3/uL (ref 1.7–7.7)
Neutrophils Relative %: 62 %
Platelets: 196 10*3/uL (ref 150–400)
RBC: 3.52 MIL/uL — ABNORMAL LOW (ref 3.87–5.11)
RDW: 14.4 % (ref 11.5–15.5)
WBC: 4.1 10*3/uL (ref 4.0–10.5)
nRBC: 0 % (ref 0.0–0.2)

## 2019-05-28 MED ORDER — ACETAMINOPHEN 325 MG PO TABS
650.0000 mg | ORAL_TABLET | Freq: Once | ORAL | Status: AC
  Administered 2019-05-28: 10:00:00 650 mg via ORAL
  Filled 2019-05-28: qty 2

## 2019-05-28 MED ORDER — HEPARIN SOD (PORK) LOCK FLUSH 100 UNIT/ML IV SOLN
500.0000 [IU] | Freq: Once | INTRAVENOUS | Status: AC | PRN
  Administered 2019-05-28: 500 [IU]

## 2019-05-28 MED ORDER — SODIUM CHLORIDE 0.9 % IV SOLN
3.0000 mg/kg | Freq: Once | INTRAVENOUS | Status: AC
  Administered 2019-05-28: 180 mg via INTRAVENOUS
  Filled 2019-05-28: qty 8

## 2019-05-28 MED ORDER — SODIUM CHLORIDE 0.9% FLUSH
10.0000 mL | INTRAVENOUS | Status: DC | PRN
  Administered 2019-05-28: 10 mL
  Filled 2019-05-28: qty 10

## 2019-05-28 MED ORDER — DIPHENHYDRAMINE HCL 25 MG PO CAPS
25.0000 mg | ORAL_CAPSULE | Freq: Once | ORAL | Status: AC
  Administered 2019-05-28: 10:00:00 25 mg via ORAL
  Filled 2019-05-28: qty 1

## 2019-05-28 MED ORDER — PROCHLORPERAZINE MALEATE 10 MG PO TABS
10.0000 mg | ORAL_TABLET | Freq: Four times a day (QID) | ORAL | Status: DC | PRN
  Administered 2019-05-28: 10 mg via ORAL
  Filled 2019-05-28: qty 1

## 2019-05-28 MED ORDER — SODIUM CHLORIDE 0.9 % IV SOLN
Freq: Once | INTRAVENOUS | Status: AC
  Administered 2019-05-28: 10:00:00 via INTRAVENOUS

## 2019-05-28 NOTE — Patient Instructions (Addendum)
[facility] at [facility] Discharge Instructions  You were seen today by Dr. Katragadda. He went over your recent lab results. He will see you back in 3 weeks for labs, treatment and follow up.   Thank you for choosing [facility] at [facility] to provide your oncology and hematology care.  To afford each patient quality time with our provider, please arrive at least 15 minutes before your scheduled appointment time.   If you have a lab appointment with the Cancer Center please come in thru the  Main Entrance and check in at the main information desk  You need to re-schedule your appointment should you arrive 10 or more minutes late.  We strive to give you quality time with our providers, and arriving late affects you and other patients whose appointments are after yours.  Also, if you no show three or more times for appointments you may be dismissed from the clinic at the providers discretion.     Again, thank you for choosing [facility].  Our hope is that these requests will decrease the amount of time that you wait before being seen by our physicians.       _____________________________________________________________  Should you have questions after your visit to [facility], please contact our office at [phone] between the hours of 8:00 a.m. and 4:30 p.m.  Voicemails left after 4:00 p.m. will not be returned until the following business day.  For prescription refill requests, have your pharmacy contact our office and allow 72 hours.    Cancer Center Support Programs:   > Cancer Support Group  2nd Tuesday of the month 1pm-2pm, Journey Room    

## 2019-05-28 NOTE — Patient Instructions (Signed)
[facility] at [facility] Discharge Instructions  Labs drawn from portacath today   Thank you for choosing [facility] at [facility] to provide your oncology and hematology care.  To afford each patient quality time with our provider, please arrive at least 15 minutes before your scheduled appointment time.   If you have a lab appointment with the Cancer Center please come in thru the Main Entrance and check in at the main information desk.  You need to re-schedule your appointment should you arrive 10 or more minutes late.  We strive to give you quality time with our providers, and arriving late affects you and other patients whose appointments are after yours.  Also, if you no show three or more times for appointments you may be dismissed from the clinic at the providers discretion.     Again, thank you for choosing [facility].  Our hope is that these requests will decrease the amount of time that you wait before being seen by our physicians.       _____________________________________________________________  Should you have questions after your visit to [facility], please contact our office at [phone] between the hours of 8:00 a.m. and 4:30 p.m.  Voicemails left after 4:00 p.m. will not be returned until the following business day.  For prescription refill requests, have your pharmacy contact our office and allow 72 hours.    Due to Covid, you will need to wear a mask upon entering the hospital. If you do not have a mask, a mask will be given to you at the Main Entrance upon arrival. For doctor visits, patients may have 1 support person with them. For treatment visits, patients can not have anyone with them due to social distancing guidelines and our immunocompromised population.     

## 2019-05-28 NOTE — Progress Notes (Signed)
Carol Duarte, Carol Duarte [id number]   CLINIC:  Medical Oncology/Hematology  PCP:  [name], MD [place] [id number] [phone]   REASON FOR VISIT:  Follow-up for Breast Cancer   CURRENT THERAPY: Kadcyla.  BRIEF ONCOLOGIC HISTORY:  Oncology History  Invasive ductal carcinoma of left breast ([place])  03/30/2017 Initial Diagnosis   Invasive ductal carcinoma of left breast ([place])   08/09/2018 - 03/20/2019 Chemotherapy   The patient had trastuzumab (HERCEPTIN) 500 mg in sodium chloride 0.9 % 250 mL chemo infusion, 525 mg, Intravenous,  Once, 10 of 10 cycles Administration: 500 mg (08/09/2018), 350 mg (09/06/2018), 350 mg (09/27/2018), 378 mg (10/18/2018), 378 mg (11/15/2018), 378 mg (12/06/2018), 378 mg (12/28/2018), 378 mg (01/18/2019), 378 mg (02/08/2019) pertuzumab (PERJETA) 840 mg in sodium chloride 0.9 % 250 mL chemo infusion, 840 mg (100 % of original dose 840 mg), Intravenous, Once, 6 of 6 cycles Dose modification: 840 mg (original dose 840 mg, Cycle 5), 420 mg (original dose 420 mg, Cycle 6) Administration: 840 mg (11/15/2018), 420 mg (12/06/2018), 420 mg (12/28/2018), 420 mg (01/18/2019), 420 mg (02/08/2019)  for chemotherapy treatment.    09/18/2018 Genetic Testing   Negative genetic testing for the breast and gyn cancer panel.  The Breast/GYN gene panel offered by [facility] includes sequencing and rearrangement analysis for the following 23 genes:  ATM, BRCA1, BRCA2, BRIP1, CDH1, CHEK2, EPCAM, MLH1, MSH2, MSH6, NBN, NF1, PALB2, PMS2, PTEN, RAD51C, RAD51D, STK11, and TP53.   The report date is 09/18/2018.   03/12/2019 -  Chemotherapy   The patient had ado-trastuzumab emtansine (KADCYLA) 140 mg in sodium chloride 0.9 % 250 mL chemo infusion, 2.4 mg/kg = 140 mg (100 % of original dose 2.4 mg/kg), Intravenous, Once, 5 of 5 cycles Dose modification: 2.4 mg/kg (original dose 2.4 mg/kg, Cycle 1, Reason: Patient Age), 3 mg/kg (original dose 2.4  mg/kg, Cycle 3, Reason: Provider Judgment) Administration: 140 mg (03/12/2019), 180 mg (04/23/2019), 180 mg (06/18/2019), 180 mg (04/02/2019), 180 mg (05/28/2019)  for chemotherapy treatment.       CANCER STAGING: Cancer Staging No matching staging information was found for the patient.   INTERVAL HISTORY:  Carol Duarte 85 y.o. female seen for follow-up of metastatic HER-2 positive breast cancer.  She has received her last cycle of Kadcyla on 04/23/2019.  She reported pain in her left hip region, particularly on weightbearing.  Hence we held her treatment last week.  She was evaluated by Dr. [name].  MRI was being ordered.  Today she reports appetite and energy levels 25%.  Pain has improved slightly.  She is keen to proceed with her next cycle of chemotherapy.  She denies any symptoms of PND or orthopnea.   REVIEW OF SYSTEMS:  Review of Systems  All other systems reviewed and are negative.    PAST MEDICAL/SURGICAL HISTORY:  Past Medical History:  Diagnosis Date  . Cancer ([place])    left breast  . History of kidney stones   . Hypercholesteremia   . Hypertension    Past Surgical History:  Procedure Laterality Date  . APPENDECTOMY    . brain cyst removed  2011  . BREAST BIOPSY Left 03/28/2018   Procedure: BREAST BIOPSY;  Surgeon: [name], MD;  Location: [facility];  Service: General;  Laterality: Left;  . CATARACT EXTRACTION W/PHACO Left 11/09/2015   Procedure: CATARACT EXTRACTION PHACO AND INTRAOCULAR LENS PLACEMENT LEFT EYE cde=8.97;  Surgeon: [name], MD;  Location: [facility];  Service: Ophthalmology;  Laterality: Left;  . CATARACT EXTRACTION W/PHACO Right 02/04/2019   Procedure: CATARACT EXTRACTION PHACO AND INTRAOCULAR LENS PLACEMENT (IOC);  Surgeon: [name], MD;  Location: [facility];  Service: Ophthalmology;  Laterality: Right;  CDE: 15.19  . EYE SURGERY     KPE left  . MASTECTOMY MODIFIED RADICAL Left 05/28/2018   Procedure: LEFT MODIFIED RADICAL MASTECTOMY;  Surgeon: [name], MD;  Location: [facility];  Service: General;  Laterality: Left;  Marland Kitchen MASTECTOMY, PARTIAL Left 04/24/2017   Procedure: MASTECTOMY PARTIAL;  Surgeon: [name], MD;  Location: [facility];  Service: General;  Laterality: Left;  . PORTACATH PLACEMENT Right 08/08/2018   Procedure: INSERTION PORT-A-CATH;  Surgeon: [name], MD;  Location: [facility];  Service: General;  Laterality: Right;  . TONSILLECTOMY    . TONSILLECTOMY AND ADENOIDECTOMY       SOCIAL HISTORY:  Social History   Socioeconomic History  . Marital status: Widowed    Spouse name: Not on file  . Number of children: Not on file  . Years of education: Not on file  . Highest education level: Not on file  Occupational History  . Not on file  Social Needs  . Financial resource strain: Not hard at all  . Food insecurity    Worry: Never true    Inability: Never true  . Transportation needs    Medical: No    Non-medical: No  Tobacco Use  . Smoking status: Never Smoker  . Smokeless tobacco: Never Used  Substance and Sexual Activity  . Alcohol use: No  . Drug use: No  . Sexual activity: Not Currently    Partners: Male  Lifestyle  . Physical activity    Days per week: Patient refused    Minutes per session: Patient refused  . Stress: Not at all  Relationships  . Social Herbalist on phone: Patient refused    Gets together: Patient refused    Attends religious service: Patient refused    Active member of club or organization: Patient refused    Attends meetings of clubs or organizations: Patient refused    Relationship status: Patient refused  . Intimate partner violence    Fear of current or ex partner: Patient refused    Emotionally abused: Patient refused    Physically abused: Patient refused    Forced sexual activity: Patient refused  Other Topics Concern  . Not on file  Social History Narrative  . Not on file    FAMILY HISTORY:  Family History  Problem Relation Age of Onset  . Heart failure  Mother   . Heart failure Father   . Congestive Heart Failure Brother   . Tuberculosis Paternal Uncle   . Tuberculosis Maternal Grandmother   . Tuberculosis Maternal Grandfather   . Congestive Heart Failure Brother     CURRENT MEDICATIONS:  Outpatient Encounter Medications as of 05/28/2019  Medication Sig  . Ado-Trastuzumab Emtansine (KADCYLA IV) Inject into the vein every 21 ( twenty-one) days.  Marland Kitchen amLODipine (NORVASC) 5 MG tablet Take 5 mg by mouth daily.  Marland Kitchen aspirin EC 81 MG tablet Take 81 mg by mouth 2 (two) times a day.  . ferrous sulfate 325 (65 FE) MG tablet Take 325 mg by mouth daily with breakfast.  . labetalol (NORMODYNE) 100 MG tablet Take 1 tablet (100 mg total) by mouth 2 (two) times daily. (Patient taking differently: Take 100 mg by mouth daily. )  . lidocaine-prilocaine (EMLA) cream Apply 1  application topically as needed (port access).  . LORazepam (ATIVAN) 1 MG tablet Take 1 mg by mouth daily.   . naproxen sodium (ALEVE) 220 MG tablet Take 220 mg by mouth daily as needed (for headache or pain).  Marland Kitchen oxybutynin (DITROPAN) 5 MG tablet Take 5 mg by mouth daily.   . simvastatin (ZOCOR) 40 MG tablet Take 40 mg by mouth daily.  . [DISCONTINUED] ALPRAZolam (XANAX) 0.5 MG tablet Take 1 tablet (0.5 mg total) by mouth at bedtime as needed for anxiety. 30 min before mri  . [DISCONTINUED] prochlorperazine (COMPAZINE) 10 MG tablet Take 1 tablet (10 mg total) by mouth every 6 (six) hours as needed (Nausea or vomiting). (Patient taking differently: Take 10 mg by mouth daily. )   No facility-administered encounter medications on file as of 05/28/2019.     ALLERGIES:  No Known Allergies   PHYSICAL EXAM:  ECOG Performance status: 1  Vitals:   05/28/19 0900  BP: (!) 135/50  Pulse: (!) 52  Resp: 16  Temp: (!) 96.7 F (35.9 C)  SpO2: 98%   Filed Weights   05/28/19 0900  Weight: 124 lb (56.2 kg)    Physical Exam Constitutional:      Appearance: Normal appearance.  HENT:      Head: Normocephalic.     Nose: Nose normal.  Eyes:     Conjunctiva/sclera: Conjunctivae normal.  Neck:     Musculoskeletal: Normal range of motion.  Cardiovascular:     Rate and Rhythm: Normal rate and regular rhythm.     Pulses: Normal pulses.     Heart sounds: Normal heart sounds.  Pulmonary:     Effort: Pulmonary effort is normal.     Breath sounds: Normal breath sounds.  Abdominal:     General: Bowel sounds are normal.  Musculoskeletal: Normal range of motion.  Skin:    General: Skin is warm and dry.  Neurological:     General: No focal deficit present.     Mental Status: She is alert and oriented to person, place, and time.  Psychiatric:        Mood and Affect: Mood normal.        Behavior: Behavior normal.        Thought Content: Thought content normal.        Judgment: Judgment normal.      LABORATORY DATA:  I have reviewed the labs as listed.  CBC    Component Value Date/Time   WBC 4.6 06/18/2019 0856   RBC 3.60 (L) 06/18/2019 0856   HGB 10.7 (L) 06/18/2019 0856   HCT 34.7 (L) 06/18/2019 0856   PLT 246 06/18/2019 0856   MCV 96.4 06/18/2019 0856   MCH 29.7 06/18/2019 0856   MCHC 30.8 06/18/2019 0856   RDW 14.5 06/18/2019 0856   LYMPHSABS 1.0 06/18/2019 0856   MONOABS 0.5 06/18/2019 0856   EOSABS 0.3 06/18/2019 0856   BASOSABS 0.1 06/18/2019 0856   CMP Latest Ref Rng & Units 06/18/2019 05/28/2019 05/14/2019  Glucose 70 - 99 mg/dL 101(H) 146(H) 166(H)  BUN 8 - 23 mg/dL 32(H) 30(H) 25(H)  Creatinine 0.44 - 1.00 mg/dL 1.31(H) 1.40(H) 1.30(H)  Sodium 135 - 145 mmol/L 140 142 140  Potassium 3.5 - 5.1 mmol/L 4.2 4.2 4.0  Chloride 98 - 111 mmol/L 107 109 108  CO2 22 - 32 mmol/L '24 25 22  ' Calcium 8.9 - 10.3 mg/dL 9.2 9.2 8.9  Total Protein 6.5 - 8.1 g/dL 6.3(L) 6.1(L) 6.1(L)  Total Bilirubin 0.3 -  1.2 mg/dL 0.6 0.7 0.8  Alkaline Phos 38 - 126 U/L 53 54 53  AST 15 - 41 U/L 47(H) 27 34  ALT 0 - 44 U/L 37 23 28   I have independently reviewed her PET CT scan  and discussed with the patient.   ASSESSMENT & PLAN:   Invasive ductal carcinoma of left breast ([place]) 1.  Metastatic HER-2 positive left breast cancer: -Left modified radical mastectomy on 05/28/2018, ER/PR negative, HER-2 positive, 3 out of 3 lymph nodes positive -PET scan on 07/23/2018 shows few scattered mildly hypermetabolic pulmonary nodules, hypermetabolic left axillary, right paratracheal, right hilar, subcarinal adenopathy compatible with metastatic disease. -4 cycles of single agent Herceptin every 3 weeks from 08/09/2018 through 10/18/2018 with progression. -Perjeta and Herceptin from 11/15/2018 through 02/08/2019 with PET scan on 02/25/2019 showing progression. -3 cycles of Kadcyla from 03/12/2019 through 04/23/2019 at 3 mg/kg. -She reports pain in the left leg is better.  She saw Dr. [name] who is ordering MRI. -She feels fine to proceed with her next cycle today.  I have reviewed her blood work.  We will see her back in 3 weeks for follow-up.  2.  High risk drug monitoring: -Echocardiogram on 04/15/2019 shows EF of 60 to 65%.  She denies any symptoms of PND or orthopnea.  Total time spent is 25 minutes with more than 50% of the time spent face-to-face discussing treatment plan, counseling and coordination of care.   Orders placed this encounter:  Orders Placed This Encounter  Procedures  . NM PET Image Restag (PS) Skull Base To Thigh  . CBC with Differential/Platelet  . Comprehensive metabolic panel  . Cancer antigen 15-3     Derek Jack, MD  [place] [phone]

## 2019-05-28 NOTE — Progress Notes (Signed)
Patient to treatment room for follow up and kadcyla.  Reviewed side effects of the treatment with all questions asked and answered.  No s/s of distress noted.   Patient seen by Dr. Delton Coombes with lab review and ok to treat today verbal order.   Patient tolerated chemotherapy with no complaints voiced.  Port site clean and dry with no bruising or swelling noted at site.  Good blood return noted before and after administration of chemotherapy.  Band aid applied.  Patient left ambulatory with VSS and no s/s of distress noted.

## 2019-06-03 ENCOUNTER — Telehealth: Payer: Self-pay | Admitting: Orthopedic Surgery

## 2019-06-03 ENCOUNTER — Other Ambulatory Visit (HOSPITAL_COMMUNITY): Payer: Self-pay | Admitting: *Deleted

## 2019-06-03 MED ORDER — ALPRAZOLAM 0.5 MG PO TABS: ORAL_TABLET | ORAL | 0 refills | Status: DC

## 2019-06-03 NOTE — Telephone Encounter (Signed)
Patient's daughter Armstead Peaks, designated contact on file, [phone], called to relay that patient has now been scheduled for a PET scan, to be done 06/10/19, at [facility]. Please advise if patient is still to have the MRI scan.

## 2019-06-03 NOTE — Telephone Encounter (Signed)
Yes, the PET scan will not show why her back and legs are hurting, the MRI more detailed, but unclear whether she can have the study with her VP shunt, I am still waiting to hear back from her doctor at [facility]

## 2019-06-03 NOTE — Telephone Encounter (Signed)
I discussed with her daughter.

## 2019-06-04 ENCOUNTER — Ambulatory Visit (HOSPITAL_COMMUNITY): Payer: Medicare Other | Admitting: Hematology

## 2019-06-04 ENCOUNTER — Other Ambulatory Visit (HOSPITAL_COMMUNITY): Payer: Medicare Other

## 2019-06-04 ENCOUNTER — Ambulatory Visit (HOSPITAL_COMMUNITY): Payer: Medicare Other

## 2019-06-06 ENCOUNTER — Other Ambulatory Visit (HOSPITAL_COMMUNITY): Payer: Self-pay | Admitting: Nurse Practitioner

## 2019-06-07 ENCOUNTER — Other Ambulatory Visit (HOSPITAL_COMMUNITY): Payer: Self-pay | Admitting: Nurse Practitioner

## 2019-06-10 ENCOUNTER — Other Ambulatory Visit: Payer: Self-pay

## 2019-06-10 ENCOUNTER — Encounter (HOSPITAL_COMMUNITY)
Admission: RE | Admit: 2019-06-10 | Discharge: 2019-06-10 | Disposition: A | Discharge status: Home / Self Care | Payer: Medicare Other | Source: Ambulatory Visit | Attending: Hematology | Admitting: Hematology

## 2019-06-10 DIAGNOSIS — C50912 Malignant neoplasm of unspecified site of left female breast: Secondary | ICD-10-CM

## 2019-06-10 DIAGNOSIS — C782 Secondary malignant neoplasm of pleura: Secondary | ICD-10-CM | POA: Diagnosis not present

## 2019-06-10 MED ORDER — FLUDEOXYGLUCOSE F - 18 (FDG) INJECTION
7.0400 | Freq: Once | INTRAVENOUS | Status: AC | PRN
  Administered 2019-06-10: 18:00:00 7.04 via INTRAVENOUS

## 2019-06-12 ENCOUNTER — Telehealth: Payer: Self-pay | Admitting: Radiology

## 2019-06-12 NOTE — Telephone Encounter (Signed)
You needed MRI lumbar on patient She has a VP shunt. I sent letter to the doctor who placed shunt, but no reply  Is there another study she may be able to have? She can not have the MRI without clearance from her doctor.

## 2019-06-13 NOTE — Telephone Encounter (Signed)
Wait for the doctors clearance

## 2019-06-17 ENCOUNTER — Other Ambulatory Visit: Payer: Self-pay

## 2019-06-18 ENCOUNTER — Inpatient Hospital Stay (HOSPITAL_COMMUNITY): Payer: Medicare Other | Attending: Hematology

## 2019-06-18 ENCOUNTER — Inpatient Hospital Stay (HOSPITAL_BASED_OUTPATIENT_CLINIC_OR_DEPARTMENT_OTHER): Payer: Medicare Other | Admitting: Hematology

## 2019-06-18 ENCOUNTER — Inpatient Hospital Stay (HOSPITAL_COMMUNITY): Payer: Medicare Other

## 2019-06-18 VITALS — BP 125/66 | HR 60 | Temp 96.6°F | Resp 18

## 2019-06-18 DIAGNOSIS — R269 Unspecified abnormalities of gait and mobility: Secondary | ICD-10-CM | POA: Diagnosis not present

## 2019-06-18 DIAGNOSIS — R590 Localized enlarged lymph nodes: Secondary | ICD-10-CM | POA: Diagnosis not present

## 2019-06-18 DIAGNOSIS — Z836 Family history of other diseases of the respiratory system: Secondary | ICD-10-CM | POA: Diagnosis not present

## 2019-06-18 DIAGNOSIS — M2550 Pain in unspecified joint: Secondary | ICD-10-CM | POA: Diagnosis not present

## 2019-06-18 DIAGNOSIS — R918 Other nonspecific abnormal finding of lung field: Secondary | ICD-10-CM | POA: Insufficient documentation

## 2019-06-18 DIAGNOSIS — M79605 Pain in left leg: Secondary | ICD-10-CM | POA: Insufficient documentation

## 2019-06-18 DIAGNOSIS — C50512 Malignant neoplasm of lower-outer quadrant of left female breast: Secondary | ICD-10-CM | POA: Diagnosis not present

## 2019-06-18 DIAGNOSIS — M25559 Pain in unspecified hip: Secondary | ICD-10-CM | POA: Diagnosis not present

## 2019-06-18 DIAGNOSIS — Z79899 Other long term (current) drug therapy: Secondary | ICD-10-CM | POA: Insufficient documentation

## 2019-06-18 DIAGNOSIS — Z87442 Personal history of urinary calculi: Secondary | ICD-10-CM | POA: Diagnosis not present

## 2019-06-18 DIAGNOSIS — C7800 Secondary malignant neoplasm of unspecified lung: Secondary | ICD-10-CM | POA: Insufficient documentation

## 2019-06-18 DIAGNOSIS — Z171 Estrogen receptor negative status [ER-]: Secondary | ICD-10-CM | POA: Insufficient documentation

## 2019-06-18 DIAGNOSIS — Z8249 Family history of ischemic heart disease and other diseases of the circulatory system: Secondary | ICD-10-CM | POA: Insufficient documentation

## 2019-06-18 DIAGNOSIS — C50912 Malignant neoplasm of unspecified site of left female breast: Secondary | ICD-10-CM

## 2019-06-18 DIAGNOSIS — Z170 Estrogen receptor positive status [ER+]: Secondary | ICD-10-CM

## 2019-06-18 DIAGNOSIS — Z9221 Personal history of antineoplastic chemotherapy: Secondary | ICD-10-CM | POA: Diagnosis not present

## 2019-06-18 DIAGNOSIS — Z5112 Encounter for antineoplastic immunotherapy: Secondary | ICD-10-CM | POA: Insufficient documentation

## 2019-06-18 LAB — COMPREHENSIVE METABOLIC PANEL
ALT: 37 U/L (ref 0–44)
AST: 47 U/L — ABNORMAL HIGH (ref 15–41)
Albumin: 3.7 g/dL (ref 3.5–5.0)
Alkaline Phosphatase: 53 U/L (ref 38–126)
Anion gap: 9 (ref 5–15)
BUN: 32 mg/dL — ABNORMAL HIGH (ref 8–23)
CO2: 24 mmol/L (ref 22–32)
Calcium: 9.2 mg/dL (ref 8.9–10.3)
Chloride: 107 mmol/L (ref 98–111)
Creatinine, Ser: 1.31 mg/dL — ABNORMAL HIGH (ref 0.44–1.00)
GFR calc Af Amer: 41 mL/min — ABNORMAL LOW (ref >60)
GFR calc non Af Amer: 35 mL/min — ABNORMAL LOW (ref >60)
Glucose, Bld: 101 mg/dL — ABNORMAL HIGH (ref 70–99)
Potassium: 4.2 mmol/L (ref 3.5–5.1)
Sodium: 140 mmol/L (ref 135–145)
Total Bilirubin: 0.6 mg/dL (ref 0.3–1.2)
Total Protein: 6.3 g/dL — ABNORMAL LOW (ref 6.5–8.1)

## 2019-06-18 LAB — CBC WITH DIFFERENTIAL/PLATELET
Abs Immature Granulocytes: 0.01 10*3/uL (ref 0.00–0.07)
Basophils Absolute: 0.1 10*3/uL (ref 0.0–0.1)
Basophils Relative: 1 %
Eosinophils Absolute: 0.3 10*3/uL (ref 0.0–0.5)
Eosinophils Relative: 6 %
HCT: 34.7 % — ABNORMAL LOW (ref 36.0–46.0)
Hemoglobin: 10.7 g/dL — ABNORMAL LOW (ref 12.0–15.0)
Immature Granulocytes: 0 %
Lymphocytes Relative: 21 %
Lymphs Abs: 1 10*3/uL (ref 0.7–4.0)
MCH: 29.7 pg (ref 26.0–34.0)
MCHC: 30.8 g/dL (ref 30.0–36.0)
MCV: 96.4 fL (ref 80.0–100.0)
Monocytes Absolute: 0.5 10*3/uL (ref 0.1–1.0)
Monocytes Relative: 11 %
Neutro Abs: 2.8 10*3/uL (ref 1.7–7.7)
Neutrophils Relative %: 61 %
Platelets: 246 10*3/uL (ref 150–400)
RBC: 3.6 MIL/uL — ABNORMAL LOW (ref 3.87–5.11)
RDW: 14.5 % (ref 11.5–15.5)
WBC: 4.6 10*3/uL (ref 4.0–10.5)
nRBC: 0 % (ref 0.0–0.2)

## 2019-06-18 MED ORDER — SODIUM CHLORIDE 0.9% FLUSH
10.0000 mL | INTRAVENOUS | Status: DC | PRN
  Administered 2019-06-18: 10 mL
  Filled 2019-06-18: qty 10

## 2019-06-18 MED ORDER — PROCHLORPERAZINE MALEATE 10 MG PO TABS
10.0000 mg | ORAL_TABLET | Freq: Four times a day (QID) | ORAL | Status: DC | PRN
  Administered 2019-06-18: 10 mg via ORAL
  Filled 2019-06-18: qty 1

## 2019-06-18 MED ORDER — ACETAMINOPHEN 325 MG PO TABS
650.0000 mg | ORAL_TABLET | Freq: Once | ORAL | Status: AC
  Administered 2019-06-18: 650 mg via ORAL
  Filled 2019-06-18: qty 2

## 2019-06-18 MED ORDER — SODIUM CHLORIDE 0.9 % IV SOLN
Freq: Once | INTRAVENOUS | Status: AC
  Administered 2019-06-18: 10:00:00 via INTRAVENOUS

## 2019-06-18 MED ORDER — SODIUM CHLORIDE 0.9 % IV SOLN
3.0000 mg/kg | Freq: Once | INTRAVENOUS | Status: AC
  Administered 2019-06-18: 180 mg via INTRAVENOUS
  Filled 2019-06-18: qty 8

## 2019-06-18 MED ORDER — HEPARIN SOD (PORK) LOCK FLUSH 100 UNIT/ML IV SOLN
500.0000 [IU] | Freq: Once | INTRAVENOUS | Status: AC | PRN
  Administered 2019-06-18: 500 [IU]

## 2019-06-18 MED ORDER — DIPHENHYDRAMINE HCL 25 MG PO CAPS
25.0000 mg | ORAL_CAPSULE | Freq: Once | ORAL | Status: AC
  Administered 2019-06-18: 25 mg via ORAL
  Filled 2019-06-18: qty 1

## 2019-06-18 NOTE — Progress Notes (Signed)
Patient seen by Harriet Pho, NP, with verbal order ok to treat.  Labs reviewed and pharmacy notified.   Patient tolerated chemotherapy with no complaints voiced.  Port site clean and dry with no bruising or swelling noted at site.  Good blood return noted before and after administration of chemotherapy.  Band aid applied.  Patient left ambulatory with VSS and no s/s of distress noted.

## 2019-06-18 NOTE — Assessment & Plan Note (Signed)
1.  Metastatic HER-2 positive left breast cancer: -Left modified radical mastectomy on 05/28/2018, ER/PR negative, HER-2 positive, 3 out of 3 lymph nodes positive -PET scan on 07/23/2018 shows few scattered mildly hypermetabolic pulmonary nodules, hypermetabolic left axillary, right paratracheal, right hilar, subcarinal adenopathy compatible with metastatic disease. -4 cycles of single agent Herceptin every 3 weeks from 08/09/2018 through 10/18/2018 with progression. -Perjeta and Herceptin from 11/15/2018 through 02/08/2019 with PET scan on 02/25/2019 showing progression. -3 cycles of Kadcyla from 03/12/2019 through 04/23/2019 at 3 mg/kg. -Restaging PET/CT from 06/10/2019 suggests minimal disease progression.  At this time, we will continue patient on Kadcyla and plan to repeat restaging scans in 3 months. -Labs are stable she will proceed with Kadcyla today.  We will schedule her for repeat echocardiogram prior to her next visit.    2.  High risk drug monitoring: -Echocardiogram on 04/15/2019 shows EF of 60 to 65%.  She denies any symptoms of PND or orthopnea.  3. L leg pain - She has noticed pain in her left leg in the last 2 to 3 weeks.  She can put minimal weight on it.  She is having to use a walker mainly in the mornings. -She has is scheduled for MRI and is being followed by Ortho.   

## 2019-06-18 NOTE — Progress Notes (Signed)
Carol Duarte, [place] [address]   CLINIC:  Medical Oncology/Hematology  PCP:  [name], MD [place] [address] [phone]   REASON FOR VISIT:  Follow-up for Metastatic Breast Cancer   CURRENT THERAPY: Kadcyla   BRIEF ONCOLOGIC HISTORY:  Oncology History  Invasive ductal carcinoma of left breast ([place])  03/30/2017 Initial Diagnosis   Invasive ductal carcinoma of left breast ([place])   08/09/2018 - 03/20/2019 Chemotherapy   The patient had trastuzumab (HERCEPTIN) 500 mg in sodium chloride 0.9 % 250 mL chemo infusion, 525 mg, Intravenous,  Once, 10 of 10 cycles Administration: 500 mg (08/09/2018), 350 mg (09/06/2018), 350 mg (09/27/2018), 378 mg (10/18/2018), 378 mg (11/15/2018), 378 mg (12/06/2018), 378 mg (12/28/2018), 378 mg (01/18/2019), 378 mg (02/08/2019) pertuzumab (PERJETA) 840 mg in sodium chloride 0.9 % 250 mL chemo infusion, 840 mg (100 % of original dose 840 mg), Intravenous, Once, 6 of 6 cycles Dose modification: 840 mg (original dose 840 mg, Cycle 5), 420 mg (original dose 420 mg, Cycle 6) Administration: 840 mg (11/15/2018), 420 mg (12/06/2018), 420 mg (12/28/2018), 420 mg (01/18/2019), 420 mg (02/08/2019)  for chemotherapy treatment.    09/18/2018 Genetic Testing   Negative genetic testing for the breast and gyn cancer panel.  The Breast/GYN gene panel offered by [facility] includes sequencing and rearrangement analysis for the following 23 genes:  ATM, BRCA1, BRCA2, BRIP1, CDH1, CHEK2, EPCAM, MLH1, MSH2, MSH6, NBN, NF1, PALB2, PMS2, PTEN, RAD51C, RAD51D, STK11, and TP53.   The report date is 09/18/2018.   03/12/2019 -  Chemotherapy   The patient had ado-trastuzumab emtansine (KADCYLA) 140 mg in sodium chloride 0.9 % 250 mL chemo infusion, 2.4 mg/kg = 140 mg (100 % of original dose 2.4 mg/kg), Intravenous, Once, 5 of 5 cycles Dose modification: 2.4 mg/kg (original dose 2.4 mg/kg, Cycle 1, Reason: Patient Age), 3 mg/kg (original  dose 2.4 mg/kg, Cycle 3, Reason: Provider Judgment) Administration: 140 mg (03/12/2019), 180 mg (04/23/2019), 180 mg (04/02/2019), 180 mg (05/28/2019)  for chemotherapy treatment.         INTERVAL HISTORY:  Carol Duarte 85 y.o. female presents today for follow up. She reports over all doing well. Her main concern today is left leg and hip pain.She can not bear weight on L leg. She is ambulating with a walker.  She is scheduled for MRI. She denies any significant fatigue. No change in bowel habits, appetite is stable. No s/s of infection. No CP, SOB, lightheadness or dizziness. She states she is ready to proceed with treatment today.   REVIEW OF SYSTEMS:  Review of Systems  Constitutional: Negative.   HENT:  Negative.   Eyes: Negative.   Respiratory: Negative.   Cardiovascular: Negative.   Gastrointestinal: Negative.   Endocrine: Negative.   Genitourinary: Negative.    Musculoskeletal: Positive for arthralgias and gait problem.  Skin: Negative.   Neurological: Positive for gait problem.  Hematological: Negative.   Psychiatric/Behavioral: Negative.      PAST MEDICAL/SURGICAL HISTORY:  Past Medical History:  Diagnosis Date   Cancer [facility])    left breast   History of kidney stones    Hypercholesteremia    Hypertension    Past Surgical History:  Procedure Laterality Date   APPENDECTOMY     brain cyst removed  2011   BREAST BIOPSY Left 03/28/2018   Procedure: BREAST BIOPSY;  Surgeon: [name], MD;  Location: [facility];  Service: General;  Laterality: Left;   CATARACT EXTRACTION  W/PHACO Left 11/09/2015   Procedure: CATARACT EXTRACTION PHACO AND INTRAOCULAR LENS PLACEMENT LEFT EYE cde=8.97;  Surgeon: [name], MD;  Location: [facility];  Service: Ophthalmology;  Laterality: Left;   CATARACT EXTRACTION W/PHACO Right 02/04/2019   Procedure: CATARACT EXTRACTION PHACO AND INTRAOCULAR LENS PLACEMENT (IOC);  Surgeon: [name], MD;  Location: [facility];  Service: Ophthalmology;   Laterality: Right;  CDE: 15.19   EYE SURGERY     KPE left   MASTECTOMY MODIFIED RADICAL Left 05/28/2018   Procedure: LEFT MODIFIED RADICAL MASTECTOMY;  Surgeon: [name], MD;  Location: [facility];  Service: General;  Laterality: Left;   MASTECTOMY, PARTIAL Left 04/24/2017   Procedure: MASTECTOMY PARTIAL;  Surgeon: [name], MD;  Location: [facility];  Service: General;  Laterality: Left;   PORTACATH PLACEMENT Right 08/08/2018   Procedure: INSERTION PORT-A-CATH;  Surgeon: [name], MD;  Location: [facility];  Service: General;  Laterality: Right;   TONSILLECTOMY     TONSILLECTOMY AND ADENOIDECTOMY       SOCIAL HISTORY:  Social History   Socioeconomic History   Marital status: Widowed    Spouse name: Not on file   Number of children: Not on file   Years of education: Not on file   Highest education level: Not on file  Occupational History   Not on file  Social Needs   Financial resource strain: Not hard at all   Food insecurity    Worry: Never true    Inability: Never true   Transportation needs    Medical: No    Non-medical: No  Tobacco Use   Smoking status: Never Smoker   Smokeless tobacco: Never Used  Substance and Sexual Activity   Alcohol use: No   Drug use: No   Sexual activity: Not Currently    Partners: Male  Lifestyle   Physical activity    Days per week: Patient refused    Minutes per session: Patient refused   Stress: Not at all  Relationships   Social connections    Talks on phone: Patient refused    Gets together: Patient refused    Attends religious service: Patient refused    Active member of club or organization: Patient refused    Attends meetings of clubs or organizations: Patient refused    Relationship status: Patient refused   Intimate partner violence    Fear of current or ex partner: Patient refused    Emotionally abused: Patient refused    Physically abused: Patient refused    Forced sexual activity: Patient  refused  Other Topics Concern   Not on file  Social History Narrative   Not on file    FAMILY HISTORY:  Family History  Problem Relation Age of Onset   Heart failure Mother    Heart failure Father    Congestive Heart Failure Brother    Tuberculosis Paternal Uncle    Tuberculosis Maternal Grandmother    Tuberculosis Maternal Grandfather    Congestive Heart Failure Brother     CURRENT MEDICATIONS:  Outpatient Encounter Medications as of 06/18/2019  Medication Sig   Ado-Trastuzumab Emtansine (KADCYLA IV) Inject into the vein every 21 ( twenty-one) days.   amLODipine (NORVASC) 5 MG tablet Take 5 mg by mouth daily.   aspirin EC 81 MG tablet Take 81 mg by mouth 2 (two) times a day.   ferrous sulfate 325 (65 FE) MG tablet Take 325 mg by mouth daily with breakfast.   labetalol (NORMODYNE) 100 MG tablet Take 1 tablet (  100 mg total) by mouth 2 (two) times daily. (Patient taking differently: Take 100 mg by mouth daily. )   lidocaine-prilocaine (EMLA) cream Apply 1 application topically as needed (port access).   LORazepam (ATIVAN) 1 MG tablet Take 1 mg by mouth daily.    oxybutynin (DITROPAN) 5 MG tablet Take 5 mg by mouth daily.    simvastatin (ZOCOR) 40 MG tablet Take 40 mg by mouth daily.   ALPRAZolam (XANAX) 0.5 MG tablet Take 1 tablet 30 min before PET (Patient not taking: Reported on 06/18/2019)   naproxen sodium (ALEVE) 220 MG tablet Take 220 mg by mouth daily as needed (for headache or pain).   [DISCONTINUED] prochlorperazine (COMPAZINE) 10 MG tablet Take 1 tablet (10 mg total) by mouth every 6 (six) hours as needed (Nausea or vomiting). (Patient taking differently: Take 10 mg by mouth daily. )   No facility-administered encounter medications on file as of 06/18/2019.     ALLERGIES:  No Known Allergies   PHYSICAL EXAM:  ECOG Performance status: 2  There were no vitals filed for this visit. There were no vitals filed for this visit.  Physical  Exam Constitutional:      Appearance: Normal appearance.  HENT:     Head: Normocephalic.     Right Ear: External ear normal.     Left Ear: External ear normal.     Nose: Nose normal.     Mouth/Throat:     Mouth: Mucous membranes are moist.     Pharynx: Oropharynx is clear.  Eyes:     Conjunctiva/sclera: Conjunctivae normal.  Neck:     Musculoskeletal: Normal range of motion.  Cardiovascular:     Rate and Rhythm: Normal rate and regular rhythm.     Pulses: Normal pulses.  Pulmonary:     Effort: Pulmonary effort is normal.     Breath sounds: Normal breath sounds.  Abdominal:     General: Bowel sounds are normal.  Musculoskeletal:     Comments: R lower extremity 3/5, Left lower extremity 1/5  Skin:    General: Skin is warm.  Neurological:     General: No focal deficit present.     Mental Status: She is alert and oriented to person, place, and time.  Psychiatric:        Mood and Affect: Mood normal.        Behavior: Behavior normal.        Thought Content: Thought content normal.        Judgment: Judgment normal.      LABORATORY DATA:  I have reviewed the labs as listed.  CBC    Component Value Date/Time   WBC 4.6 06/18/2019 0856   RBC 3.60 (L) 06/18/2019 0856   HGB 10.7 (L) 06/18/2019 0856   HCT 34.7 (L) 06/18/2019 0856   PLT 246 06/18/2019 0856   MCV 96.4 06/18/2019 0856   MCH 29.7 06/18/2019 0856   MCHC 30.8 06/18/2019 0856   RDW 14.5 06/18/2019 0856   LYMPHSABS 1.0 06/18/2019 0856   MONOABS 0.5 06/18/2019 0856   EOSABS 0.3 06/18/2019 0856   BASOSABS 0.1 06/18/2019 0856   CMP Latest Ref Rng & Units 06/18/2019 05/28/2019 05/14/2019  Glucose 70 - 99 mg/dL 101(H) 146(H) 166(H)  BUN 8 - 23 mg/dL 32(H) 30(H) 25(H)  Creatinine 0.44 - 1.00 mg/dL 1.31(H) 1.40(H) 1.30(H)  Sodium 135 - 145 mmol/L 140 142 140  Potassium 3.5 - 5.1 mmol/L 4.2 4.2 4.0  Chloride 98 - 111 mmol/L 107 109 108  CO2 22 - 32 mmol/L _0 Calcium 8.9 - 10.3 mg/dL 9.2 9.2 8.9  Total  Protein 6.5 - 8.1 g/dL 6.3(L) 6.1(L) 6.1(L)  Total Bilirubin 0.3 - 1.2 mg/dL 0.6 0.7 0.8  Alkaline Phos 38 - 126 U/L 53 54 53  AST 15 - 41 U/L 47(H) 27 34  ALT 0 - 44 U/L 37 23 28        ASSESSMENT & PLAN:   Malignant neoplasm of lower-outer quadrant of left female breast ([place]) 1.  Metastatic HER-2 positive left breast cancer: -Left modified radical mastectomy on 05/28/2018, ER/PR negative, HER-2 positive, 3 out of 3 lymph nodes positive -PET scan on 07/23/2018 shows few scattered mildly hypermetabolic pulmonary nodules, hypermetabolic left axillary, right paratracheal, right hilar, subcarinal adenopathy compatible with metastatic disease. -4 cycles of single agent Herceptin every 3 weeks from 08/09/2018 through 10/18/2018 with progression. -Perjeta and Herceptin from 11/15/2018 through 02/08/2019 with PET scan on 02/25/2019 showing progression. -3 cycles of Kadcyla from 03/12/2019 through 04/23/2019 at 3 mg/kg. -Restaging PET/CT from 06/10/2019 suggests minimal disease progression.  At this time, we will continue patient on Kadcyla and plan to repeat restaging scans in 3 months. -Labs are stable she will proceed with Kadcyla today.  We will schedule her for repeat echocardiogram prior to her next visit.    2.  High risk drug monitoring: -Echocardiogram on 04/15/2019 shows EF of 60 to 65%.  She denies any symptoms of PND or orthopnea.  3. L leg pain - She has noticed pain in her left leg in the last 2 to 3 weeks.  She can put minimal weight on it.  She is having to use a walker mainly in the mornings. -She has is scheduled for MRI and is being followed by Ortho.        Orders placed this encounter:  Orders Placed This Encounter  Procedures   CBC with Differential   Comprehensive metabolic panel   Cancer antigen 15-3   ECHOCARDIOGRAM COMPLETE      [place] [phone]

## 2019-06-19 LAB — CANCER ANTIGEN 15-3: CA 15-3: 5.3 U/mL (ref 0.0–25.0)

## 2019-06-21 ENCOUNTER — Other Ambulatory Visit: Payer: Self-pay

## 2019-06-21 DIAGNOSIS — Z20822 Contact with and (suspected) exposure to covid-19: Secondary | ICD-10-CM

## 2019-06-23 LAB — NOVEL CORONAVIRUS, NAA: SARS-CoV-2, NAA: NOT DETECTED

## 2019-06-29 ENCOUNTER — Encounter (HOSPITAL_COMMUNITY): Payer: Self-pay | Admitting: Hematology

## 2019-06-29 NOTE — Assessment & Plan Note (Signed)
1.  Metastatic HER-2 positive left breast cancer: -Left modified radical mastectomy on 05/28/2018, ER/PR negative, HER-2 positive, 3 out of 3 lymph nodes positive -PET scan on 07/23/2018 shows few scattered mildly hypermetabolic pulmonary nodules, hypermetabolic left axillary, right paratracheal, right hilar, subcarinal adenopathy compatible with metastatic disease. -4 cycles of single agent Herceptin every 3 weeks from 08/09/2018 through 10/18/2018 with progression. -Perjeta and Herceptin from 11/15/2018 through 02/08/2019 with PET scan on 02/25/2019 showing progression. -3 cycles of Kadcyla from 03/12/2019 through 04/23/2019 at 3 mg/kg. -She reports pain in the left leg is better.  She saw Dr. [name] who is ordering MRI. -She feels fine to proceed with her next cycle today.  I have reviewed her blood work.  We will see her back in 3 weeks for follow-up.  2.  High risk drug monitoring: -Echocardiogram on 04/15/2019 shows EF of 60 to 65%.  She denies any symptoms of PND or orthopnea.

## 2019-07-01 ENCOUNTER — Telehealth: Payer: Self-pay

## 2019-07-01 NOTE — Telephone Encounter (Signed)
They indicate now the doctor does not need her to have anything done, no xray or reprogramming. I told her daughter I have to get a letter from them to let us know this, then we can set up MRI, the letter they sent states she needs xray and reprogramming / she will let them know we need new letter.

## 2019-07-01 NOTE — Telephone Encounter (Signed)
Patient left 2 messages on voicemail wanting to find out about her MRI.  Please call and advise

## 2019-07-01 NOTE — Telephone Encounter (Signed)
Her neurologist at [facility] wanted her to have xrays there first and then he will need to arrange for the shunt to be reprogrammed after the MRI scan  If she has made these arrangements, and had the xrays, we can schedule the MRI. I will call her.

## 2019-07-02 ENCOUNTER — Other Ambulatory Visit: Payer: Self-pay

## 2019-07-02 ENCOUNTER — Ambulatory Visit (HOSPITAL_COMMUNITY)
Admission: RE | Admit: 2019-07-02 | Discharge: 2019-07-02 | Disposition: A | Discharge status: Home / Self Care | Payer: Medicare Other | Source: Ambulatory Visit | Attending: Hematology | Admitting: Hematology

## 2019-07-02 DIAGNOSIS — Z9012 Acquired absence of left breast and nipple: Secondary | ICD-10-CM | POA: Insufficient documentation

## 2019-07-02 DIAGNOSIS — C50512 Malignant neoplasm of lower-outer quadrant of left female breast: Secondary | ICD-10-CM | POA: Diagnosis not present

## 2019-07-02 DIAGNOSIS — I313 Pericardial effusion (noninflammatory): Secondary | ICD-10-CM | POA: Diagnosis not present

## 2019-07-02 DIAGNOSIS — Z170 Estrogen receptor positive status [ER+]: Secondary | ICD-10-CM | POA: Diagnosis not present

## 2019-07-02 NOTE — Telephone Encounter (Signed)
I called the Neurosurgeon office. To see if they can help with the MRI. I need to know location, phone number of Facility they use. I left a message to have a nurse call me back.

## 2019-07-02 NOTE — Progress Notes (Signed)
*  PRELIMINARY RESULTS* Echocardiogram 2D Echocardiogram has been performed.  Carol Duarte 07/02/2019, 11:18 AM

## 2019-07-02 NOTE — Telephone Encounter (Signed)
Patient's daughter Carol Duarte, [phone] designated contact, called to follow up regarding the MRI scheduling at [facility] - as previously noted. States she also has name of a contact person if helpful: Peggy, at patient's neurosurgeon office, Dr Harrison Mons at [phone] - patient has shunt from surgery by Dr Redmond Pulling.

## 2019-07-02 NOTE — Telephone Encounter (Signed)
New notes in the system indicate they would like to do the MRI at [facility] so they can reprogram it after the study.   I will have to find the information for MRI scheduling at [facility].

## 2019-07-08 NOTE — Telephone Encounter (Signed)
They have not returned my call, I have called scheduling, and have been told to fax order and they will take care of scheduling her. I have faxed it.

## 2019-07-09 ENCOUNTER — Inpatient Hospital Stay (HOSPITAL_COMMUNITY): Payer: Medicare Other

## 2019-07-09 ENCOUNTER — Inpatient Hospital Stay (HOSPITAL_BASED_OUTPATIENT_CLINIC_OR_DEPARTMENT_OTHER): Payer: Medicare Other | Admitting: Hematology

## 2019-07-09 ENCOUNTER — Other Ambulatory Visit: Payer: Self-pay

## 2019-07-09 ENCOUNTER — Inpatient Hospital Stay (HOSPITAL_COMMUNITY): Payer: Medicare Other | Attending: Hematology

## 2019-07-09 ENCOUNTER — Encounter (HOSPITAL_COMMUNITY): Payer: Self-pay | Admitting: Hematology

## 2019-07-09 VITALS — BP 124/58 | HR 50 | Temp 97.9°F | Resp 18

## 2019-07-09 VITALS — BP 149/63 | HR 56 | Temp 97.5°F | Resp 18 | Wt 122.8 lb

## 2019-07-09 DIAGNOSIS — Z8249 Family history of ischemic heart disease and other diseases of the circulatory system: Secondary | ICD-10-CM | POA: Insufficient documentation

## 2019-07-09 DIAGNOSIS — Z836 Family history of other diseases of the respiratory system: Secondary | ICD-10-CM | POA: Diagnosis not present

## 2019-07-09 DIAGNOSIS — C50512 Malignant neoplasm of lower-outer quadrant of left female breast: Secondary | ICD-10-CM | POA: Insufficient documentation

## 2019-07-09 DIAGNOSIS — C7800 Secondary malignant neoplasm of unspecified lung: Secondary | ICD-10-CM | POA: Diagnosis not present

## 2019-07-09 DIAGNOSIS — C50912 Malignant neoplasm of unspecified site of left female breast: Secondary | ICD-10-CM

## 2019-07-09 DIAGNOSIS — Z23 Encounter for immunization: Secondary | ICD-10-CM | POA: Insufficient documentation

## 2019-07-09 DIAGNOSIS — Z79899 Other long term (current) drug therapy: Secondary | ICD-10-CM | POA: Insufficient documentation

## 2019-07-09 DIAGNOSIS — Z5112 Encounter for antineoplastic immunotherapy: Secondary | ICD-10-CM | POA: Insufficient documentation

## 2019-07-09 DIAGNOSIS — M25552 Pain in left hip: Secondary | ICD-10-CM | POA: Insufficient documentation

## 2019-07-09 DIAGNOSIS — Z0000 Encounter for general adult medical examination without abnormal findings: Secondary | ICD-10-CM

## 2019-07-09 DIAGNOSIS — Z171 Estrogen receptor negative status [ER-]: Secondary | ICD-10-CM | POA: Diagnosis not present

## 2019-07-09 LAB — CBC WITH DIFFERENTIAL/PLATELET
Abs Immature Granulocytes: 0.02 10*3/uL (ref 0.00–0.07)
Basophils Absolute: 0 10*3/uL (ref 0.0–0.1)
Basophils Relative: 1 %
Eosinophils Absolute: 0.3 10*3/uL (ref 0.0–0.5)
Eosinophils Relative: 6 %
HCT: 35.9 % — ABNORMAL LOW (ref 36.0–46.0)
Hemoglobin: 10.9 g/dL — ABNORMAL LOW (ref 12.0–15.0)
Immature Granulocytes: 0 %
Lymphocytes Relative: 19 %
Lymphs Abs: 0.9 10*3/uL (ref 0.7–4.0)
MCH: 30.1 pg (ref 26.0–34.0)
MCHC: 30.4 g/dL (ref 30.0–36.0)
MCV: 99.2 fL (ref 80.0–100.0)
Monocytes Absolute: 0.4 10*3/uL (ref 0.1–1.0)
Monocytes Relative: 10 %
Neutro Abs: 2.9 10*3/uL (ref 1.7–7.7)
Neutrophils Relative %: 64 %
Platelets: 210 10*3/uL (ref 150–400)
RBC: 3.62 MIL/uL — ABNORMAL LOW (ref 3.87–5.11)
RDW: 14.1 % (ref 11.5–15.5)
WBC: 4.5 10*3/uL (ref 4.0–10.5)
nRBC: 0 % (ref 0.0–0.2)

## 2019-07-09 LAB — COMPREHENSIVE METABOLIC PANEL
ALT: 27 U/L (ref 0–44)
AST: 30 U/L (ref 15–41)
Albumin: 3.5 g/dL (ref 3.5–5.0)
Alkaline Phosphatase: 58 U/L (ref 38–126)
Anion gap: 8 (ref 5–15)
BUN: 32 mg/dL — ABNORMAL HIGH (ref 8–23)
CO2: 24 mmol/L (ref 22–32)
Calcium: 9.2 mg/dL (ref 8.9–10.3)
Chloride: 109 mmol/L (ref 98–111)
Creatinine, Ser: 1.23 mg/dL — ABNORMAL HIGH (ref 0.44–1.00)
GFR calc Af Amer: 44 mL/min — ABNORMAL LOW (ref >60)
GFR calc non Af Amer: 38 mL/min — ABNORMAL LOW (ref >60)
Glucose, Bld: 110 mg/dL — ABNORMAL HIGH (ref 70–99)
Potassium: 4.3 mmol/L (ref 3.5–5.1)
Sodium: 141 mmol/L (ref 135–145)
Total Bilirubin: 0.7 mg/dL (ref 0.3–1.2)
Total Protein: 6.5 g/dL (ref 6.5–8.1)

## 2019-07-09 MED ORDER — HEPARIN SOD (PORK) LOCK FLUSH 100 UNIT/ML IV SOLN
500.0000 [IU] | Freq: Once | INTRAVENOUS | Status: AC | PRN
  Administered 2019-07-09: 500 [IU]

## 2019-07-09 MED ORDER — SODIUM CHLORIDE 0.9 % IV SOLN
Freq: Once | INTRAVENOUS | Status: AC
  Administered 2019-07-09: 11:00:00 via INTRAVENOUS

## 2019-07-09 MED ORDER — PROCHLORPERAZINE MALEATE 10 MG PO TABS
10.0000 mg | ORAL_TABLET | Freq: Four times a day (QID) | ORAL | Status: DC | PRN
  Administered 2019-07-09: 10 mg via ORAL

## 2019-07-09 MED ORDER — INFLUENZA VAC A&B SA ADJ QUAD 0.5 ML IM PRSY
0.5000 mL | PREFILLED_SYRINGE | Freq: Once | INTRAMUSCULAR | Status: AC
  Administered 2019-07-09: 0.5 mL via INTRAMUSCULAR
  Filled 2019-07-09: qty 0.5

## 2019-07-09 MED ORDER — SODIUM CHLORIDE 0.9 % IV SOLN
3.0000 mg/kg | Freq: Once | INTRAVENOUS | Status: AC
  Administered 2019-07-09: 180 mg via INTRAVENOUS
  Filled 2019-07-09: qty 1

## 2019-07-09 MED ORDER — PROCHLORPERAZINE MALEATE 10 MG PO TABS
ORAL_TABLET | ORAL | Status: AC
  Filled 2019-07-09: qty 1

## 2019-07-09 MED ORDER — ACETAMINOPHEN 325 MG PO TABS
ORAL_TABLET | ORAL | Status: AC
  Filled 2019-07-09: qty 2

## 2019-07-09 MED ORDER — INFLUENZA VAC A&B SA ADJ QUAD 0.5 ML IM PRSY: 0.5000 mL | PREFILLED_SYRINGE | Freq: Once | INTRAMUSCULAR | Status: DC

## 2019-07-09 MED ORDER — DIPHENHYDRAMINE HCL 25 MG PO CAPS
ORAL_CAPSULE | ORAL | Status: AC
  Filled 2019-07-09: qty 1

## 2019-07-09 MED ORDER — DIPHENHYDRAMINE HCL 25 MG PO CAPS
25.0000 mg | ORAL_CAPSULE | Freq: Once | ORAL | Status: AC
  Administered 2019-07-09: 25 mg via ORAL

## 2019-07-09 MED ORDER — ACETAMINOPHEN 325 MG PO TABS
650.0000 mg | ORAL_TABLET | Freq: Once | ORAL | Status: AC
  Administered 2019-07-09: 650 mg via ORAL

## 2019-07-09 NOTE — Patient Instructions (Signed)
[facility] at [facility] Discharge Instructions  Labs drawn from portacath today   Thank you for choosing [facility] at [facility] to provide your oncology and hematology care.  To afford each patient quality time with our provider, please arrive at least 15 minutes before your scheduled appointment time.   If you have a lab appointment with the Cancer Center please come in thru the Main Entrance and check in at the main information desk.  You need to re-schedule your appointment should you arrive 10 or more minutes late.  We strive to give you quality time with our providers, and arriving late affects you and other patients whose appointments are after yours.  Also, if you no show three or more times for appointments you may be dismissed from the clinic at the providers discretion.     Again, thank you for choosing [facility].  Our hope is that these requests will decrease the amount of time that you wait before being seen by our physicians.       _____________________________________________________________  Should you have questions after your visit to [facility], please contact our office at [phone] between the hours of 8:00 a.m. and 4:30 p.m.  Voicemails left after 4:00 p.m. will not be returned until the following business day.  For prescription refill requests, have your pharmacy contact our office and allow 72 hours.    Due to Covid, you will need to wear a mask upon entering the hospital. If you do not have a mask, a mask will be given to you at the Main Entrance upon arrival. For doctor visits, patients may have 1 support person with them. For treatment visits, patients can not have anyone with them due to social distancing guidelines and our immunocompromised population.     

## 2019-07-09 NOTE — Assessment & Plan Note (Signed)
1.  Metastatic HER-2 positive left breast cancer: -Left modified radical mastectomy on 05/28/2018, ER/PR negative, HER-2 positive, 3 out of 3 lymph nodes positive -PET scan on 07/23/2018 shows few scattered mildly hypermetabolic pulmonary nodules, hypermetabolic left axillary, right paratracheal, right hilar, subcarinal adenopathy compatible with metastatic disease. -4 cycles of single agent Herceptin every 3 weeks from 08/09/2018 through 10/18/2018 with progression. -Perjeta and Herceptin from 11/15/2018 through 02/16/2027 PET scan on 02/25/2019 showing progression. -5 cycles of Kadcyla from 03/12/2019 through 06/18/2019. -We reviewed PET CT scan from 06/10/1999 4:20 cycles of Kadcyla, which showed increased hypermetabolism in the thoracic nodes, but similar size.  Pulmonary nodules showed stable hypermetabolism but slight increase in size, largest measuring 0.3 cm.  We have recommended continuation of current chemotherapy. -We have reviewed her labs.  She will proceed with her current therapy.  2.  Left hip pain: -She reported having left hip and knee pain on weightbearing.  This pain started while she was started on Kadcyla.  She reports the pain is present up to 2 PM daily and she does not have any pain after that.  She has to use walker before 2 PM. -She was evaluated by Dr. [name].  She is having MRI done at [facility] -I have went back and reviewed PET CT scans which did not show any evidence of bone metastasis.  3.  High risk drug monitoring: -I reviewed echocardiogram from 07/02/2019 which showed ejection fraction of 60-65%.

## 2019-07-09 NOTE — Progress Notes (Signed)
Carol Duarte, [place] [address]   CLINIC:  Medical Oncology/Hematology  PCP:  [name], MD [place] [address] [phone]   REASON FOR VISIT:  Follow-up for Breast Cancer   CURRENT THERAPY: Kadcyla.  BRIEF ONCOLOGIC HISTORY:  Oncology History  Invasive ductal carcinoma of left breast ([place])  03/30/2017 Initial Diagnosis   Invasive ductal carcinoma of left breast ([place])   08/09/2018 - 03/20/2019 Chemotherapy   The patient had trastuzumab (HERCEPTIN) 500 mg in sodium chloride 0.9 % 250 mL chemo infusion, 525 mg, Intravenous,  Once, 10 of 10 cycles Administration: 500 mg (08/09/2018), 350 mg (09/06/2018), 350 mg (09/27/2018), 378 mg (10/18/2018), 378 mg (11/15/2018), 378 mg (12/06/2018), 378 mg (12/28/2018), 378 mg (01/18/2019), 378 mg (02/08/2019) pertuzumab (PERJETA) 840 mg in sodium chloride 0.9 % 250 mL chemo infusion, 840 mg (100 % of original dose 840 mg), Intravenous, Once, 6 of 6 cycles Dose modification: 840 mg (original dose 840 mg, Cycle 5), 420 mg (original dose 420 mg, Cycle 6) Administration: 840 mg (11/15/2018), 420 mg (12/06/2018), 420 mg (12/28/2018), 420 mg (01/18/2019), 420 mg (02/08/2019)  for chemotherapy treatment.    09/18/2018 Genetic Testing   Negative genetic testing for the breast and gyn cancer panel.  The Breast/GYN gene panel offered by [facility] includes sequencing and rearrangement analysis for the following 23 genes:  ATM, BRCA1, BRCA2, BRIP1, CDH1, CHEK2, EPCAM, MLH1, MSH2, MSH6, NBN, NF1, PALB2, PMS2, PTEN, RAD51C, RAD51D, STK11, and TP53.   The report date is 09/18/2018.   03/12/2019 -  Chemotherapy   The patient had ado-trastuzumab emtansine (KADCYLA) 140 mg in sodium chloride 0.9 % 250 mL chemo infusion, 2.4 mg/kg = 140 mg (100 % of original dose 2.4 mg/kg), Intravenous, Once, 6 of 8 cycles Dose modification: 2.4 mg/kg (original dose 2.4 mg/kg, Cycle 1, Reason: Patient Age), 3 mg/kg (original dose 2.4  mg/kg, Cycle 3, Reason: Provider Judgment) Administration: 140 mg (03/12/2019), 180 mg (04/23/2019), 180 mg (06/18/2019), 180 mg (04/02/2019), 180 mg (05/28/2019), 180 mg (07/09/2019)  for chemotherapy treatment.       CANCER STAGING: Cancer Staging No matching staging information was found for the patient.   INTERVAL HISTORY:  Carol Duarte 85 y.o. female seen for follow-up of metastatic breast cancer.  Reported pain in the left hip region which radiates to the left knee region.  She walks with help of walker.  She reports improvement in the pain around 2 PM each day.  She does not require any help with walker after 2 PM.  She was seen by Dr. [name] who referred her to [facility].  She had PET scan on 06/10/2019.  Denies any symptoms of PND or orthopnea.  Appetite is 25% and energy levels are 75%.  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   REVIEW OF SYSTEMS:  Review of Systems  Psychiatric/Behavioral: Positive for sleep disturbance.  All other systems reviewed and are negative.    PAST MEDICAL/SURGICAL HISTORY:  Past Medical History:  Diagnosis Date  . Cancer ([place])    left breast  . History of kidney stones   . Hypercholesteremia   . Hypertension    Past Surgical History:  Procedure Laterality Date  . APPENDECTOMY    . brain cyst removed  2011  . BREAST BIOPSY Left 03/28/2018   Procedure: BREAST BIOPSY;  Surgeon: [name], MD;  Location: [facility];  Service: General;  Laterality: Left;  . CATARACT EXTRACTION W/PHACO Left 11/09/2015  Procedure: CATARACT EXTRACTION PHACO AND INTRAOCULAR LENS PLACEMENT LEFT EYE cde=8.97;  Surgeon: [name], MD;  Location: [facility];  Service: Ophthalmology;  Laterality: Left;  . CATARACT EXTRACTION W/PHACO Right 02/04/2019   Procedure: CATARACT EXTRACTION PHACO AND INTRAOCULAR LENS PLACEMENT (IOC);  Surgeon: [name], MD;  Location: [facility];  Service: Ophthalmology;  Laterality: Right;  CDE: 15.19  . EYE SURGERY     KPE left  .  MASTECTOMY MODIFIED RADICAL Left 05/28/2018   Procedure: LEFT MODIFIED RADICAL MASTECTOMY;  Surgeon: [name], MD;  Location: [facility];  Service: General;  Laterality: Left;  Marland Kitchen MASTECTOMY, PARTIAL Left 04/24/2017   Procedure: MASTECTOMY PARTIAL;  Surgeon: [name], MD;  Location: [facility];  Service: General;  Laterality: Left;  . PORTACATH PLACEMENT Right 08/08/2018   Procedure: INSERTION PORT-A-CATH;  Surgeon: [name], MD;  Location: [facility];  Service: General;  Laterality: Right;  . TONSILLECTOMY    . TONSILLECTOMY AND ADENOIDECTOMY       SOCIAL HISTORY:  Social History   Socioeconomic History  . Marital status: Widowed    Spouse name: Not on file  . Number of children: Not on file  . Years of education: Not on file  . Highest education level: Not on file  Occupational History  . Not on file  Social Needs  . Financial resource strain: Not hard at all  . Food insecurity    Worry: Never true    Inability: Never true  . Transportation needs    Medical: No    Non-medical: No  Tobacco Use  . Smoking status: Never Smoker  . Smokeless tobacco: Never Used  Substance and Sexual Activity  . Alcohol use: No  . Drug use: No  . Sexual activity: Not Currently    Partners: Male  Lifestyle  . Physical activity    Days per week: Patient refused    Minutes per session: Patient refused  . Stress: Not at all  Relationships  . Social Herbalist on phone: Patient refused    Gets together: Patient refused    Attends religious service: Patient refused    Active member of club or organization: Patient refused    Attends meetings of clubs or organizations: Patient refused    Relationship status: Patient refused  . Intimate partner violence    Fear of current or ex partner: Patient refused    Emotionally abused: Patient refused    Physically abused: Patient refused    Forced sexual activity: Patient refused  Other Topics Concern  . Not on file  Social History  Narrative  . Not on file    FAMILY HISTORY:  Family History  Problem Relation Age of Onset  . Heart failure Mother   . Heart failure Father   . Congestive Heart Failure Brother   . Tuberculosis Paternal Uncle   . Tuberculosis Maternal Grandmother   . Tuberculosis Maternal Grandfather   . Congestive Heart Failure Brother     CURRENT MEDICATIONS:  Outpatient Encounter Medications as of 07/09/2019  Medication Sig Note  . Ado-Trastuzumab Emtansine (KADCYLA IV) Inject into the vein every 21 ( twenty-one) days.   Marland Kitchen amLODipine (NORVASC) 5 MG tablet Take 5 mg by mouth daily.   Marland Kitchen aspirin EC 81 MG tablet Take 81 mg by mouth 2 (two) times a day.   . ferrous sulfate 325 (65 FE) MG tablet Take 325 mg by mouth daily with breakfast.   . labetalol (NORMODYNE) 100 MG tablet Take 1 tablet (  100 mg total) by mouth 2 (two) times daily. (Patient taking differently: Take 100 mg by mouth daily. )   . oxybutynin (DITROPAN) 5 MG tablet Take 5 mg by mouth daily.    . simvastatin (ZOCOR) 40 MG tablet Take 40 mg by mouth daily.   Marland Kitchen ALPRAZolam (XANAX) 0.5 MG tablet Take 1 tablet 30 min before PET (Patient not taking: Reported on 06/18/2019)   . lidocaine-prilocaine (EMLA) cream Apply 1 application topically as needed (port access).   . LORazepam (ATIVAN) 1 MG tablet Take 1 mg by mouth daily.    . naproxen sodium (ALEVE) 220 MG tablet Take 220 mg by mouth daily as needed (for headache or pain).   . [DISCONTINUED] prochlorperazine (COMPAZINE) 10 MG tablet Take 1 tablet (10 mg total) by mouth every 6 (six) hours as needed (Nausea or vomiting). (Patient taking differently: Take 10 mg by mouth daily. )   . [DISCONTINUED] influenza vaccine adjuvanted (FLUAD) injection 0.5 mL  07/09/2019: wrong encounter   No facility-administered encounter medications on file as of 07/09/2019.     ALLERGIES:  No Known Allergies   PHYSICAL EXAM:  ECOG Performance status: 1  Vitals:   07/09/19 0938  BP: (!) 149/63  Pulse:  (!) 56  Resp: 18  Temp: (!) 97.5 F (36.4 C)  SpO2: 99%   Filed Weights   07/09/19 0938  Weight: 122 lb 12.8 oz (55.7 kg)    Physical Exam Constitutional:      Appearance: Normal appearance.  HENT:     Head: Normocephalic.     Nose: Nose normal.  Eyes:     Conjunctiva/sclera: Conjunctivae normal.  Neck:     Musculoskeletal: Normal range of motion.  Cardiovascular:     Rate and Rhythm: Normal rate and regular rhythm.     Pulses: Normal pulses.     Heart sounds: Normal heart sounds.  Pulmonary:     Effort: Pulmonary effort is normal.     Breath sounds: Normal breath sounds.  Abdominal:     General: Bowel sounds are normal.  Musculoskeletal: Normal range of motion.  Skin:    General: Skin is warm and dry.  Neurological:     General: No focal deficit present.     Mental Status: She is alert and oriented to person, place, and time.  Psychiatric:        Mood and Affect: Mood normal.        Behavior: Behavior normal.        Thought Content: Thought content normal.        Judgment: Judgment normal.      LABORATORY DATA:  I have reviewed the labs as listed.  CBC    Component Value Date/Time   WBC 4.5 07/09/2019 0925   RBC 3.62 (L) 07/09/2019 0925   HGB 10.9 (L) 07/09/2019 0925   HCT 35.9 (L) 07/09/2019 0925   PLT 210 07/09/2019 0925   MCV 99.2 07/09/2019 0925   MCH 30.1 07/09/2019 0925   MCHC 30.4 07/09/2019 0925   RDW 14.1 07/09/2019 0925   LYMPHSABS 0.9 07/09/2019 0925   MONOABS 0.4 07/09/2019 0925   EOSABS 0.3 07/09/2019 0925   BASOSABS 0.0 07/09/2019 0925   CMP Latest Ref Rng & Units 07/09/2019 06/18/2019 05/28/2019  Glucose 70 - 99 mg/dL 110(H) 101(H) 146(H)  BUN 8 - 23 mg/dL 32(H) 32(H) 30(H)  Creatinine 0.44 - 1.00 mg/dL 1.23(H) 1.31(H) 1.40(H)  Sodium 135 - 145 mmol/L 141 140 142  Potassium 3.5 - 5.1 mmol/L  4.3 4.2 4.2  Chloride 98 - 111 mmol/L 109 107 109  CO2 22 - 32 mmol/L _0 Calcium 8.9 - 10.3 mg/dL 9.2 9.2 9.2  Total Protein 6.5 - 8.1  g/dL 6.5 6.3(L) 6.1(L)  Total Bilirubin 0.3 - 1.2 mg/dL 0.7 0.6 0.7  Alkaline Phos 38 - 126 U/L 58 53 54  AST 15 - 41 U/L 30 47(H) 27  ALT 0 - 44 U/L 27 37 23   I have independently reviewed her PET CT scan and discussed with the patient.   ASSESSMENT & PLAN:   Invasive ductal carcinoma of left breast ([place]) 1.  Metastatic HER-2 positive left breast cancer: -Left modified radical mastectomy on 05/28/2018, ER/PR negative, HER-2 positive, 3 out of 3 lymph nodes positive -PET scan on 07/23/2018 shows few scattered mildly hypermetabolic pulmonary nodules, hypermetabolic left axillary, right paratracheal, right hilar, subcarinal adenopathy compatible with metastatic disease. -4 cycles of single agent Herceptin every 3 weeks from 08/09/2018 through 10/18/2018 with progression. -Perjeta and Herceptin from 11/15/2018 through 02/16/2027 PET scan on 02/25/2019 showing progression. -5 cycles of Kadcyla from 03/12/2019 through 06/18/2019. -We reviewed PET CT scan from 06/10/1999 4:20 cycles of Kadcyla, which showed increased hypermetabolism in the thoracic nodes, but similar size.  Pulmonary nodules showed stable hypermetabolism but slight increase in size, largest measuring 0.3 cm.  We have recommended continuation of current chemotherapy. -We have reviewed her labs.  She will proceed with her current therapy.  2.  Left hip pain: -She reported having left hip and knee pain on weightbearing.  This pain started while she was started on Kadcyla.  She reports the pain is present up to 2 PM daily and she does not have any pain after that.  She has to use walker before 2 PM. -She was evaluated by Dr. [name].  She is having MRI [facility] at [facility]. -I have went back and reviewed PET CT scans which did not show any evidence of bone metastasis.  3.  High risk drug monitoring: -I reviewed echocardiogram from 07/02/2019 which showed ejection fraction of 60-65%.  Total time spent is 25 minutes with more  than 50% of the time spent face-to-face discussing treatment plan, counseling and coordination of care.   Orders placed this encounter:  No orders of the defined types were placed in this encounter.    Derek Jack, MD  [place] [phone]

## 2019-07-09 NOTE — Patient Instructions (Signed)
[facility] Discharge Instructions for Patients Receiving Chemotherapy  Today you received the following chemotherapy agents   To help prevent nausea and vomiting after your treatment, we encourage you to take your nausea medication   If you develop nausea and vomiting that is not controlled by your nausea medication, call the clinic.   BELOW ARE SYMPTOMS THAT SHOULD BE REPORTED IMMEDIATELY:  *FEVER GREATER THAN 100.5 F  *CHILLS WITH OR WITHOUT FEVER  NAUSEA AND VOMITING THAT IS NOT CONTROLLED WITH YOUR NAUSEA MEDICATION  *UNUSUAL SHORTNESS OF BREATH  *UNUSUAL BRUISING OR BLEEDING  TENDERNESS IN MOUTH AND THROAT WITH OR WITHOUT PRESENCE OF ULCERS  *URINARY PROBLEMS  *BOWEL PROBLEMS  UNUSUAL RASH Items with * indicate a potential emergency and should be followed up as soon as possible.  Feel free to call the clinic should you have any questions or concerns. The clinic phone number is [phone].  Please show the CHEMO ALERT CARD at check-in to the Emergency Department and triage nurse.   

## 2019-07-09 NOTE — Progress Notes (Signed)
Message received to proceed with treatment today. MAR reviewed. Labs reviewed by Dr. Delton Coombes. Flu vaccine administered today. See MAR.    Treatment given today per MD orders. Tolerated infusion without adverse affects. Vital signs stable. No complaints at this time. Discharged from clinic ambulatory. F/U with [facility] as scheduled.

## 2019-07-09 NOTE — Patient Instructions (Signed)
[facility] at [facility] Discharge Instructions  You were seen today by Dr. Katragadda. He went over your recent lab results. He will see you back in 3 weeks for labs, treatment and follow up.   Thank you for choosing [facility] at [facility] to provide your oncology and hematology care.  To afford each patient quality time with our provider, please arrive at least 15 minutes before your scheduled appointment time.   If you have a lab appointment with the Cancer Center please come in thru the  Main Entrance and check in at the main information desk  You need to re-schedule your appointment should you arrive 10 or more minutes late.  We strive to give you quality time with our providers, and arriving late affects you and other patients whose appointments are after yours.  Also, if you no show three or more times for appointments you may be dismissed from the clinic at the providers discretion.     Again, thank you for choosing [facility].  Our hope is that these requests will decrease the amount of time that you wait before being seen by our physicians.       _____________________________________________________________  Should you have questions after your visit to [facility], please contact our office at [phone] between the hours of 8:00 a.m. and 4:30 p.m.  Voicemails left after 4:00 p.m. will not be returned until the following business day.  For prescription refill requests, have your pharmacy contact our office and allow 72 hours.    Cancer Center Support Programs:   > Cancer Support Group  2nd Tuesday of the month 1pm-2pm, Journey Room    

## 2019-07-24 DIAGNOSIS — M5116 Intervertebral disc disorders with radiculopathy, lumbar region: Secondary | ICD-10-CM | POA: Diagnosis not present

## 2019-07-24 DIAGNOSIS — M5442 Lumbago with sciatica, left side: Secondary | ICD-10-CM | POA: Diagnosis not present

## 2019-07-24 DIAGNOSIS — M5115 Intervertebral disc disorders with radiculopathy, thoracolumbar region: Secondary | ICD-10-CM | POA: Diagnosis not present

## 2019-07-24 DIAGNOSIS — M4725 Other spondylosis with radiculopathy, thoracolumbar region: Secondary | ICD-10-CM | POA: Diagnosis not present

## 2019-07-24 DIAGNOSIS — Z982 Presence of cerebrospinal fluid drainage device: Secondary | ICD-10-CM | POA: Diagnosis not present

## 2019-07-24 DIAGNOSIS — M4726 Other spondylosis with radiculopathy, lumbar region: Secondary | ICD-10-CM | POA: Diagnosis not present

## 2019-07-24 DIAGNOSIS — Z4541 Encounter for adjustment and management of cerebrospinal fluid drainage device: Secondary | ICD-10-CM | POA: Diagnosis not present

## 2019-07-29 ENCOUNTER — Telehealth: Payer: Self-pay | Admitting: Radiology

## 2019-07-29 ENCOUNTER — Other Ambulatory Visit: Payer: Self-pay

## 2019-07-29 NOTE — Telephone Encounter (Signed)
Patient had MRI [facility], put a copy on your desk FINDINGS:    Alignment:  Mild dextrocurvature of the lumbar spine.  Mild retrolisthesis of T12-L1 measuring 5 mm. Mild retrolisthesis of L1-L2 measuring 4 mm. Anterolisthesis of L3-L4 measuring 3 mm.  Vertebrae: L5 this is a transitional vertebra with sacralized transverse processes are fused to the sacrum. Vertebral body heights are maintained. No marrow signal abnormalities to suggest neoplasm. Degenerative endplate edema at B4-Y3.  Conus medullaris:  In normal position.  Normal signal and contour.  Degenerative changes: Multilevel disc desiccation and volume loss, most pronounced at L4-L5.  T11-T12:  No canal or foraminal stenosis.  T12-L1:  Right eccentric broad-based disc bulge mild bilateral facet hypertrophy. No spinal canal stenosis. Severe right and moderate left neural foraminal stenosis. L1-L2:  Left eccentric broad-based disc bulge and mild bilateral facet arthropathy. Mild spinal canal stenosis. Moderate left and mild right neural foraminal stenosis. L2-L3:  Broad-based disc bulge and mild bilateral facet arthropathy.  No spinal canal stenosis. Moderate left and mild right neural foraminal stenosis. L3-L4:  Broad-based disc bulge, mild bilateral facet arthropathy and ligamentum flavum thickening resulting in moderate spinal canal stenosis.  Mild bilateral neural foraminal stenosis. L4-L5:  Broad-based disc bulge and mild bilateral facet arthropathy.  No spinal canal stenosis. Moderate left and mild right neural foraminal stenosis. L5-S1: No stenosis.  Upper Sacrum:  No focal lesion identified.  Additional comments:  Incompletely characterized parenchymal cystic lesions in the right kidney and renal sinus cysts in the left kidney. Moderate paraspinal muscle atrophy, progressing distal to L3.  CONCLUSION:   Multilevel degenerative changes. Moderate canal stenosis at L3-L4. Severe right and moderate left foraminal stenosis at  T12-L1. Moderate neural foraminal stenosis at L1-L2, L2-L3, and L4-L5 on the left.

## 2019-07-30 ENCOUNTER — Inpatient Hospital Stay (HOSPITAL_COMMUNITY): Payer: Medicare Other | Attending: Hematology

## 2019-07-30 ENCOUNTER — Inpatient Hospital Stay (HOSPITAL_BASED_OUTPATIENT_CLINIC_OR_DEPARTMENT_OTHER): Payer: Medicare Other | Admitting: Hematology

## 2019-07-30 ENCOUNTER — Encounter (HOSPITAL_COMMUNITY): Payer: Self-pay

## 2019-07-30 ENCOUNTER — Inpatient Hospital Stay (HOSPITAL_COMMUNITY): Payer: Medicare Other

## 2019-07-30 ENCOUNTER — Encounter (HOSPITAL_COMMUNITY): Payer: Self-pay | Admitting: Hematology

## 2019-07-30 VITALS — BP 140/50 | HR 52 | Temp 97.2°F | Resp 18 | Wt 125.4 lb

## 2019-07-30 DIAGNOSIS — Z87442 Personal history of urinary calculi: Secondary | ICD-10-CM | POA: Insufficient documentation

## 2019-07-30 DIAGNOSIS — M25552 Pain in left hip: Secondary | ICD-10-CM | POA: Diagnosis not present

## 2019-07-30 DIAGNOSIS — C7802 Secondary malignant neoplasm of left lung: Secondary | ICD-10-CM | POA: Insufficient documentation

## 2019-07-30 DIAGNOSIS — G479 Sleep disorder, unspecified: Secondary | ICD-10-CM | POA: Diagnosis not present

## 2019-07-30 DIAGNOSIS — Z836 Family history of other diseases of the respiratory system: Secondary | ICD-10-CM | POA: Insufficient documentation

## 2019-07-30 DIAGNOSIS — C7801 Secondary malignant neoplasm of right lung: Secondary | ICD-10-CM | POA: Insufficient documentation

## 2019-07-30 DIAGNOSIS — Z79899 Other long term (current) drug therapy: Secondary | ICD-10-CM | POA: Insufficient documentation

## 2019-07-30 DIAGNOSIS — Z171 Estrogen receptor negative status [ER-]: Secondary | ICD-10-CM | POA: Insufficient documentation

## 2019-07-30 DIAGNOSIS — Z9012 Acquired absence of left breast and nipple: Secondary | ICD-10-CM | POA: Insufficient documentation

## 2019-07-30 DIAGNOSIS — C50912 Malignant neoplasm of unspecified site of left female breast: Secondary | ICD-10-CM

## 2019-07-30 DIAGNOSIS — Z5112 Encounter for antineoplastic immunotherapy: Secondary | ICD-10-CM | POA: Diagnosis not present

## 2019-07-30 DIAGNOSIS — Z170 Estrogen receptor positive status [ER+]: Secondary | ICD-10-CM

## 2019-07-30 DIAGNOSIS — C50512 Malignant neoplasm of lower-outer quadrant of left female breast: Secondary | ICD-10-CM | POA: Diagnosis not present

## 2019-07-30 DIAGNOSIS — Z8249 Family history of ischemic heart disease and other diseases of the circulatory system: Secondary | ICD-10-CM | POA: Insufficient documentation

## 2019-07-30 LAB — CBC WITH DIFFERENTIAL/PLATELET
Abs Immature Granulocytes: 0.02 10*3/uL (ref 0.00–0.07)
Basophils Absolute: 0.1 10*3/uL (ref 0.0–0.1)
Basophils Relative: 1 %
Eosinophils Absolute: 0.2 10*3/uL (ref 0.0–0.5)
Eosinophils Relative: 5 %
HCT: 35 % — ABNORMAL LOW (ref 36.0–46.0)
Hemoglobin: 10.9 g/dL — ABNORMAL LOW (ref 12.0–15.0)
Immature Granulocytes: 0 %
Lymphocytes Relative: 18 %
Lymphs Abs: 0.9 10*3/uL (ref 0.7–4.0)
MCH: 29.9 pg (ref 26.0–34.0)
MCHC: 31.1 g/dL (ref 30.0–36.0)
MCV: 95.9 fL (ref 80.0–100.0)
Monocytes Absolute: 0.5 10*3/uL (ref 0.1–1.0)
Monocytes Relative: 10 %
Neutro Abs: 3.2 10*3/uL (ref 1.7–7.7)
Neutrophils Relative %: 66 %
Platelets: 215 10*3/uL (ref 150–400)
RBC: 3.65 MIL/uL — ABNORMAL LOW (ref 3.87–5.11)
RDW: 14 % (ref 11.5–15.5)
WBC: 5 10*3/uL (ref 4.0–10.5)
nRBC: 0 % (ref 0.0–0.2)

## 2019-07-30 LAB — COMPREHENSIVE METABOLIC PANEL
ALT: 33 U/L (ref 0–44)
AST: 39 U/L (ref 15–41)
Albumin: 3.6 g/dL (ref 3.5–5.0)
Alkaline Phosphatase: 56 U/L (ref 38–126)
Anion gap: 8 (ref 5–15)
BUN: 28 mg/dL — ABNORMAL HIGH (ref 8–23)
CO2: 24 mmol/L (ref 22–32)
Calcium: 9.1 mg/dL (ref 8.9–10.3)
Chloride: 110 mmol/L (ref 98–111)
Creatinine, Ser: 1.14 mg/dL — ABNORMAL HIGH (ref 0.44–1.00)
GFR calc Af Amer: 48 mL/min — ABNORMAL LOW (ref >60)
GFR calc non Af Amer: 42 mL/min — ABNORMAL LOW (ref >60)
Glucose, Bld: 104 mg/dL — ABNORMAL HIGH (ref 70–99)
Potassium: 4.1 mmol/L (ref 3.5–5.1)
Sodium: 142 mmol/L (ref 135–145)
Total Bilirubin: 0.2 mg/dL — ABNORMAL LOW (ref 0.3–1.2)
Total Protein: 6.4 g/dL — ABNORMAL LOW (ref 6.5–8.1)

## 2019-07-30 MED ORDER — SODIUM CHLORIDE 0.9 % IV SOLN
3.0000 mg/kg | Freq: Once | INTRAVENOUS | Status: AC
  Administered 2019-07-30: 180 mg via INTRAVENOUS
  Filled 2019-07-30: qty 9

## 2019-07-30 MED ORDER — PROCHLORPERAZINE MALEATE 10 MG PO TABS
10.0000 mg | ORAL_TABLET | Freq: Four times a day (QID) | ORAL | Status: DC | PRN
  Administered 2019-07-30: 10 mg via ORAL

## 2019-07-30 MED ORDER — HEPARIN SOD (PORK) LOCK FLUSH 100 UNIT/ML IV SOLN
500.0000 [IU] | Freq: Once | INTRAVENOUS | Status: AC | PRN
  Administered 2019-07-30: 500 [IU]

## 2019-07-30 MED ORDER — PROCHLORPERAZINE MALEATE 10 MG PO TABS
ORAL_TABLET | ORAL | Status: AC
  Filled 2019-07-30: qty 1

## 2019-07-30 MED ORDER — DIPHENHYDRAMINE HCL 25 MG PO CAPS
25.0000 mg | ORAL_CAPSULE | Freq: Once | ORAL | Status: AC
  Administered 2019-07-30: 25 mg via ORAL

## 2019-07-30 MED ORDER — SODIUM CHLORIDE 0.9% FLUSH
10.0000 mL | INTRAVENOUS | Status: DC | PRN
  Administered 2019-07-30: 10 mL
  Filled 2019-07-30: qty 10

## 2019-07-30 MED ORDER — DIPHENHYDRAMINE HCL 25 MG PO CAPS
ORAL_CAPSULE | ORAL | Status: AC
  Filled 2019-07-30: qty 1

## 2019-07-30 MED ORDER — ACETAMINOPHEN 325 MG PO TABS
ORAL_TABLET | ORAL | Status: AC
  Filled 2019-07-30: qty 2

## 2019-07-30 MED ORDER — SODIUM CHLORIDE 0.9 % IV SOLN
Freq: Once | INTRAVENOUS | Status: AC
  Administered 2019-07-30: 10:00:00 via INTRAVENOUS

## 2019-07-30 MED ORDER — ACETAMINOPHEN 325 MG PO TABS
650.0000 mg | ORAL_TABLET | Freq: Once | ORAL | Status: AC
  Administered 2019-07-30: 650 mg via ORAL

## 2019-07-30 NOTE — Assessment & Plan Note (Signed)
1.  Metastatic HER-2 positive left breast cancer: -Left modified radical mastectomy on 05/28/2018, ER/PR negative, HER-2 positive, 3 out of 3 lymph nodes positive -PET scan on 07/23/2018 shows few scattered mildly hypermetabolic pulmonary nodules, hypermetabolic left axillary, right paratracheal, right hilar, subcarinal adenopathy compatible with metastatic disease. -4 cycles of single agent Herceptin every 3 weeks from 08/09/2018 through 10/18/2018 with progression. -Perjeta and Herceptin from 11/15/2018 through 02/16/2027 PET scan on 02/25/2019 showing progression. -6 cycles of Kadcyla from 03/12/2019 through 07/09/2019. -PET scan on 06/10/2019 showed increased hypermetabolism in the thoracic nodes but similar size.  Pulmonary nodules have stable hypermetabolism but slight increase in size, largest measuring 0.3 cm.  We have recommended continuation of therapy. -She does not report any immunotherapy related side effects.  She may proceed with her Kadcyla today. -She will come back in 3 weeks for follow-up.  I plan to repeat PET scan prior to next visit.  2.  Left hip pain: -She has left hip pain on weightbearing.  She was evaluated by Dr. Harrison. -I have reviewed MRI of the lumbar spine on 07/24/2019 without contrast at [facility] showed multilevel degenerative changes.  Moderate canal stenosis at L3-L4.  Severe right and moderate left foraminal stenosis at T12-L4. -No evidence of malignancy seen. -She will follow up with Dr. Harrison for further management.  3.  High risk drug monitoring: -I reviewed echocardiogram from 07/02/2019 which showed ejection fraction of 60-65%. 

## 2019-07-30 NOTE — Patient Instructions (Addendum)
[place] at [facility] Discharge Instructions  You were seen today by Dr. Delton Coombes. He went over your recent lab results. Your cancer will never go away completely. It can't be cured but can be treated and controlled. He would like you to try Melatonin 5 mg to help with your sleep. He will schedule you for a PET scan. He will see you back in 3 weeks for labs, treatment and follow up.   Thank you for choosing [place] at [facility] to provide your oncology and hematology care.  To afford each patient quality time with our provider, please arrive at least 15 minutes before your scheduled appointment time.   If you have a lab appointment with the [facility] please come in thru the  Main Entrance and check in at the main information desk  You need to re-schedule your appointment should you arrive 10 or more minutes late.  We strive to give you quality time with our providers, and arriving late affects you and other patients whose appointments are after yours.  Also, if you no show three or more times for appointments you may be dismissed from the clinic at the providers discretion.     Again, thank you for choosing [facility].  Our hope is that these requests will decrease the amount of time that you wait before being seen by our physicians.       _____________________________________________________________  Should you have questions after your visit to [facility], please contact our office at [phone] between the hours of 8:00 a.m. and 4:30 p.m.  Voicemails left after 4:00 p.m. will not be returned until the following business day.  For prescription refill requests, have your pharmacy contact our office and allow 72 hours.    Cancer Center Support Programs:   > Cancer Support Group  2nd Tuesday of the month 1pm-2pm, Journey Room

## 2019-07-30 NOTE — Patient Instructions (Signed)
[facility] Discharge Instructions for Patients Receiving Chemotherapy  Today you received the following chemotherapy agents   To help prevent nausea and vomiting after your treatment, we encourage you to take your nausea medication   If you develop nausea and vomiting that is not controlled by your nausea medication, call the clinic.   BELOW ARE SYMPTOMS THAT SHOULD BE REPORTED IMMEDIATELY:  *FEVER GREATER THAN 100.5 F  *CHILLS WITH OR WITHOUT FEVER  NAUSEA AND VOMITING THAT IS NOT CONTROLLED WITH YOUR NAUSEA MEDICATION  *UNUSUAL SHORTNESS OF BREATH  *UNUSUAL BRUISING OR BLEEDING  TENDERNESS IN MOUTH AND THROAT WITH OR WITHOUT PRESENCE OF ULCERS  *URINARY PROBLEMS  *BOWEL PROBLEMS  UNUSUAL RASH Items with * indicate a potential emergency and should be followed up as soon as possible.  Feel free to call the clinic should you have any questions or concerns. The clinic phone number is [phone].  Please show the CHEMO ALERT CARD at check-in to the Emergency Department and triage nurse.   

## 2019-07-30 NOTE — Progress Notes (Signed)
Carol Duarte, Carol Duarte [id number]   CLINIC:  Medical Oncology/Hematology  PCP:  [name], MD [place] [address] [phone]   REASON FOR VISIT:  Follow-up for Breast Cancer   CURRENT THERAPY: Kadcyla.  BRIEF ONCOLOGIC HISTORY:  Oncology History  Invasive ductal carcinoma of left breast ([place])  03/30/2017 Initial Diagnosis   Invasive ductal carcinoma of left breast ([place])   08/09/2018 - 03/20/2019 Chemotherapy   The patient had trastuzumab (HERCEPTIN) 500 mg in sodium chloride 0.9 % 250 mL chemo infusion, 525 mg, Intravenous,  Once, 10 of 10 cycles Administration: 500 mg (08/09/2018), 350 mg (09/06/2018), 350 mg (09/27/2018), 378 mg (10/18/2018), 378 mg (11/15/2018), 378 mg (12/06/2018), 378 mg (12/28/2018), 378 mg (01/18/2019), 378 mg (02/08/2019) pertuzumab (PERJETA) 840 mg in sodium chloride 0.9 % 250 mL chemo infusion, 840 mg (100 % of original dose 840 mg), Intravenous, Once, 6 of 6 cycles Dose modification: 840 mg (original dose 840 mg, Cycle 5), 420 mg (original dose 420 mg, Cycle 6) Administration: 840 mg (11/15/2018), 420 mg (12/06/2018), 420 mg (12/28/2018), 420 mg (01/18/2019), 420 mg (02/08/2019)  for chemotherapy treatment.    09/18/2018 Genetic Testing   Negative genetic testing for the breast and gyn cancer panel.  The Breast/GYN gene panel offered by [facility] includes sequencing and rearrangement analysis for the following 23 genes:  ATM, BRCA1, BRCA2, BRIP1, CDH1, CHEK2, EPCAM, MLH1, MSH2, MSH6, NBN, NF1, PALB2, PMS2, PTEN, RAD51C, RAD51D, STK11, and TP53.   The report date is 09/18/2018.   03/12/2019 -  Chemotherapy   The patient had ado-trastuzumab emtansine (KADCYLA) 140 mg in sodium chloride 0.9 % 250 mL chemo infusion, 2.4 mg/kg = 140 mg (100 % of original dose 2.4 mg/kg), Intravenous, Once, 7 of 8 cycles Dose modification: 2.4 mg/kg (original dose 2.4 mg/kg, Cycle 1, Reason: Patient Age), 3 mg/kg (original dose 2.4  mg/kg, Cycle 3, Reason: Provider Judgment) Administration: 140 mg (03/12/2019), 180 mg (04/23/2019), 180 mg (06/18/2019), 180 mg (04/02/2019), 180 mg (05/28/2019), 180 mg (07/09/2019), 180 mg (07/30/2019)  for chemotherapy treatment.       CANCER STAGING: Cancer Staging No matching staging information was found for the patient.   INTERVAL HISTORY:  Carol Duarte 85 y.o. female seen for follow-up of metastatic left breast cancer.  She has been receiving Kadcyla and has been tolerating reasonably well.  She continues to have left hip pain.  She was evaluated at [facility] and a MRI of the lumbar spine was done on 07/24/2019.  Appetite and energy levels are 75%.  She is able to do all her day-to-day activities.  Denies any signs or symptoms of PND or orthopnea.  Denies any nausea, vomiting, diarrhea or constipation.  REVIEW OF SYSTEMS:  Review of Systems  Psychiatric/Behavioral: Positive for sleep disturbance.  All other systems reviewed and are negative.    PAST MEDICAL/SURGICAL HISTORY:  Past Medical History:  Diagnosis Date  . Cancer ([place])    left breast  . History of kidney stones   . Hypercholesteremia   . Hypertension    Past Surgical History:  Procedure Laterality Date  . APPENDECTOMY    . brain cyst removed  2011  . BREAST BIOPSY Left 03/28/2018   Procedure: BREAST BIOPSY;  Surgeon: [name], MD;  Location: [facility];  Service: General;  Laterality: Left;  . CATARACT EXTRACTION W/PHACO Left 11/09/2015   Procedure: CATARACT EXTRACTION PHACO AND INTRAOCULAR LENS PLACEMENT LEFT EYE cde=8.97;  Surgeon: Levada Dy  Geoffry Paradise, MD;  Location: [facility];  Service: Ophthalmology;  Laterality: Left;  . CATARACT EXTRACTION W/PHACO Right 02/04/2019   Procedure: CATARACT EXTRACTION PHACO AND INTRAOCULAR LENS PLACEMENT (IOC);  Surgeon: [name], MD;  Location: [facility];  Service: Ophthalmology;  Laterality: Right;  CDE: 15.19  . EYE SURGERY     KPE left  . MASTECTOMY MODIFIED RADICAL Left  05/28/2018   Procedure: LEFT MODIFIED RADICAL MASTECTOMY;  Surgeon: [name], MD;  Location: [facility];  Service: General;  Laterality: Left;  Marland Kitchen MASTECTOMY, PARTIAL Left 04/24/2017   Procedure: MASTECTOMY PARTIAL;  Surgeon: [name], MD;  Location: [facility];  Service: General;  Laterality: Left;  . PORTACATH PLACEMENT Right 08/08/2018   Procedure: INSERTION PORT-A-CATH;  Surgeon: [name], MD;  Location: [facility];  Service: General;  Laterality: Right;  . TONSILLECTOMY    . TONSILLECTOMY AND ADENOIDECTOMY       SOCIAL HISTORY:  Social History   Socioeconomic History  . Marital status: Widowed    Spouse name: Not on file  . Number of children: Not on file  . Years of education: Not on file  . Highest education level: Not on file  Occupational History  . Not on file  Social Needs  . Financial resource strain: Not hard at all  . Food insecurity    Worry: Never true    Inability: Never true  . Transportation needs    Medical: No    Non-medical: No  Tobacco Use  . Smoking status: Never Smoker  . Smokeless tobacco: Never Used  Substance and Sexual Activity  . Alcohol use: No  . Drug use: No  . Sexual activity: Not Currently    Partners: Male  Lifestyle  . Physical activity    Days per week: Patient refused    Minutes per session: Patient refused  . Stress: Not at all  Relationships  . Social Herbalist on phone: Patient refused    Gets together: Patient refused    Attends religious service: Patient refused    Active member of club or organization: Patient refused    Attends meetings of clubs or organizations: Patient refused    Relationship status: Patient refused  . Intimate partner violence    Fear of current or ex partner: Patient refused    Emotionally abused: Patient refused    Physically abused: Patient refused    Forced sexual activity: Patient refused  Other Topics Concern  . Not on file  Social History Narrative  . Not on file    FAMILY  HISTORY:  Family History  Problem Relation Age of Onset  . Heart failure Mother   . Heart failure Father   . Congestive Heart Failure Brother   . Tuberculosis Paternal Uncle   . Tuberculosis Maternal Grandmother   . Tuberculosis Maternal Grandfather   . Congestive Heart Failure Brother     CURRENT MEDICATIONS:  Outpatient Encounter Medications as of 07/30/2019  Medication Sig  . Ado-Trastuzumab Emtansine (KADCYLA IV) Inject into the vein every 21 ( twenty-one) days.  . ALPRAZolam (XANAX) 0.5 MG tablet Take 1 tablet 30 min before PET  . amLODipine (NORVASC) 5 MG tablet Take 5 mg by mouth daily.  Marland Kitchen aspirin EC 81 MG tablet Take 81 mg by mouth 2 (two) times a day.  . ferrous sulfate 325 (65 FE) MG tablet Take 325 mg by mouth daily with breakfast.  . labetalol (NORMODYNE) 100 MG tablet Take 1 tablet (100 mg total) by mouth  2 (two) times daily. (Patient taking differently: Take 100 mg by mouth daily. )  . lidocaine-prilocaine (EMLA) cream Apply 1 application topically as needed (port access).  . LORazepam (ATIVAN) 1 MG tablet Take 1 mg by mouth daily.   . naproxen sodium (ALEVE) 220 MG tablet Take 220 mg by mouth daily as needed (for headache or pain).  Marland Kitchen oxybutynin (DITROPAN) 5 MG tablet Take 5 mg by mouth daily.   . simvastatin (ZOCOR) 40 MG tablet Take 40 mg by mouth daily.  . [DISCONTINUED] prochlorperazine (COMPAZINE) 10 MG tablet Take 1 tablet (10 mg total) by mouth every 6 (six) hours as needed (Nausea or vomiting). (Patient taking differently: Take 10 mg by mouth daily. )   No facility-administered encounter medications on file as of 07/30/2019.     ALLERGIES:  No Known Allergies   PHYSICAL EXAM:  ECOG Performance status: 1  There were no vitals filed for this visit. There were no vitals filed for this visit.  Physical Exam Constitutional:      Appearance: Normal appearance.  HENT:     Head: Normocephalic.     Nose: Nose normal.  Eyes:     Conjunctiva/sclera:  Conjunctivae normal.  Neck:     Musculoskeletal: Normal range of motion.  Cardiovascular:     Rate and Rhythm: Normal rate and regular rhythm.     Pulses: Normal pulses.     Heart sounds: Normal heart sounds.  Pulmonary:     Effort: Pulmonary effort is normal.     Breath sounds: Normal breath sounds.  Abdominal:     General: Bowel sounds are normal.  Musculoskeletal: Normal range of motion.  Skin:    General: Skin is warm and dry.  Neurological:     General: No focal deficit present.     Mental Status: She is alert and oriented to person, place, and time.  Psychiatric:        Mood and Affect: Mood normal.        Behavior: Behavior normal.        Thought Content: Thought content normal.        Judgment: Judgment normal.      LABORATORY DATA:  I have reviewed the labs as listed.  CBC    Component Value Date/Time   WBC 5.0 07/30/2019 0842   RBC 3.65 (L) 07/30/2019 0842   HGB 10.9 (L) 07/30/2019 0842   HCT 35.0 (L) 07/30/2019 0842   PLT 215 07/30/2019 0842   MCV 95.9 07/30/2019 0842   MCH 29.9 07/30/2019 0842   MCHC 31.1 07/30/2019 0842   RDW 14.0 07/30/2019 0842   LYMPHSABS 0.9 07/30/2019 0842   MONOABS 0.5 07/30/2019 0842   EOSABS 0.2 07/30/2019 0842   BASOSABS 0.1 07/30/2019 0842   CMP Latest Ref Rng & Units 07/30/2019 07/09/2019 06/18/2019  Glucose 70 - 99 mg/dL 104(H) 110(H) 101(H)  BUN 8 - 23 mg/dL 28(H) 32(H) 32(H)  Creatinine 0.44 - 1.00 mg/dL 1.14(H) 1.23(H) 1.31(H)  Sodium 135 - 145 mmol/L 142 141 140  Potassium 3.5 - 5.1 mmol/L 4.1 4.3 4.2  Chloride 98 - 111 mmol/L 110 109 107  CO2 22 - 32 mmol/L _0 Calcium 8.9 - 10.3 mg/dL 9.1 9.2 9.2  Total Protein 6.5 - 8.1 g/dL 6.4(L) 6.5 6.3(L)  Total Bilirubin 0.3 - 1.2 mg/dL 0.2(L) 0.7 0.6  Alkaline Phos 38 - 126 U/L 56 58 53  AST 15 - 41 U/L 39 30 47(H)  ALT 0 - 44 U/L  33 27 37   I have independently reviewed her PET CT scan and discussed with the patient.   ASSESSMENT & PLAN:   Invasive ductal  carcinoma of left breast ([place]) 1.  Metastatic HER-2 positive left breast cancer: -Left modified radical mastectomy on 05/28/2018, ER/PR negative, HER-2 positive, 3 out of 3 lymph nodes positive -PET scan on 07/23/2018 shows few scattered mildly hypermetabolic pulmonary nodules, hypermetabolic left axillary, right paratracheal, right hilar, subcarinal adenopathy compatible with metastatic disease. -4 cycles of single agent Herceptin every 3 weeks from 08/09/2018 through 10/18/2018 with progression. -Perjeta and Herceptin from 11/15/2018 through 02/16/2027 PET scan on 02/25/2019 showing progression. -6 cycles of Kadcyla from 03/12/2019 through 07/09/2019. -PET scan on 06/10/2019 showed increased hypermetabolism in the thoracic nodes but similar size.  Pulmonary nodules have stable hypermetabolism but slight increase in size, largest measuring 0.3 cm.  We have recommended continuation of therapy. -She does not report any immunotherapy related side effects.  She may proceed with her Kadcyla today. -She will come back in 3 weeks for follow-up.  I plan to repeat PET scan prior to next visit.  2.  Left hip pain: -She has left hip pain on weightbearing.  She was evaluated by Dr. [name]. -I have reviewed MRI of the lumbar spine on 07/24/2019 without contrast at [facility] showed multilevel degenerative changes.  Moderate canal stenosis at L3-L4.  Severe right and moderate left foraminal stenosis at T12-L4. -No evidence of malignancy seen. -She will follow up with Dr. [name] for further management.  3.  High risk drug monitoring: -I reviewed echocardiogram from 07/02/2019 which showed ejection fraction of 60-65%.  Total time spent is 25 minutes with more than 50% of the time spent face-to-face discussing treatment plan, counseling and coordination of care.   Orders placed this encounter:  Orders Placed This Encounter  Procedures  . NM PET Image Restag (PS) Skull Base To Thigh  . CBC with  Differential/Platelet  . Comprehensive metabolic panel     Derek Jack, MD  [place] [phone]

## 2019-07-30 NOTE — Progress Notes (Signed)
Patient presents today for treatment and follow up visit with Dr. Delton Coombes. Vital signs are within parameters for treatment. MAR reviewed. Pt has no complaints of any pain or changes since the last visit.   Treatment given today per MD orders. Tolerated infusion without adverse affects. Vital signs stable. No complaints at this time. Discharged from clinic ambulatory. F/U with [facility] as scheduled.

## 2019-07-31 ENCOUNTER — Telehealth: Payer: Self-pay

## 2019-07-31 LAB — CANCER ANTIGEN 15-3: CA 15-3: 7.2 U/mL (ref 0.0–25.0)

## 2019-07-31 NOTE — Telephone Encounter (Signed)
Patient left message on voicemail stating that she saw her cancer doctor yesterday and [facility] has already called him and said it might be arthritis. Stated she was told that maybe Dr. [name] could tell her what can be done.  Please call and advise  She left this # [phone] to call.

## 2019-08-01 ENCOUNTER — Other Ambulatory Visit: Payer: Self-pay | Admitting: Orthopedic Surgery

## 2019-08-01 ENCOUNTER — Telehealth: Payer: Self-pay | Admitting: Orthopedic Surgery

## 2019-08-01 ENCOUNTER — Telehealth: Payer: Self-pay | Admitting: Radiology

## 2019-08-01 DIAGNOSIS — M5410 Radiculopathy, site unspecified: Secondary | ICD-10-CM

## 2019-08-01 NOTE — Telephone Encounter (Signed)
-----   Message from Carole Civil, MD sent at 08/01/2019 10:03 AM EST ----- ORDER ESI SERIES AT [place]

## 2019-08-01 NOTE — Telephone Encounter (Signed)
MRI LUMBAR SPINE   DDD L4-5  ESI AT [facility]

## 2019-08-05 ENCOUNTER — Other Ambulatory Visit (HOSPITAL_COMMUNITY): Payer: Self-pay | Admitting: *Deleted

## 2019-08-05 MED ORDER — ALPRAZOLAM 0.5 MG PO TABS: ORAL_TABLET | ORAL | 0 refills | Status: DC

## 2019-08-07 ENCOUNTER — Other Ambulatory Visit: Payer: Medicare Other

## 2019-08-15 ENCOUNTER — Other Ambulatory Visit: Payer: Medicare Other

## 2019-08-19 ENCOUNTER — Encounter (HOSPITAL_COMMUNITY): Payer: Medicare Other

## 2019-08-19 ENCOUNTER — Encounter (HOSPITAL_COMMUNITY): Payer: Self-pay

## 2019-08-20 ENCOUNTER — Inpatient Hospital Stay (HOSPITAL_BASED_OUTPATIENT_CLINIC_OR_DEPARTMENT_OTHER): Payer: Medicare Other | Admitting: Hematology

## 2019-08-20 ENCOUNTER — Other Ambulatory Visit: Payer: Self-pay

## 2019-08-20 ENCOUNTER — Inpatient Hospital Stay (HOSPITAL_COMMUNITY): Payer: Medicare Other

## 2019-08-20 ENCOUNTER — Encounter (HOSPITAL_COMMUNITY): Payer: Self-pay | Admitting: Hematology

## 2019-08-20 VITALS — BP 139/58 | HR 55 | Resp 18

## 2019-08-20 VITALS — BP 115/53 | HR 54 | Temp 97.5°F | Resp 18 | Wt 122.0 lb

## 2019-08-20 DIAGNOSIS — C7801 Secondary malignant neoplasm of right lung: Secondary | ICD-10-CM | POA: Diagnosis not present

## 2019-08-20 DIAGNOSIS — C50512 Malignant neoplasm of lower-outer quadrant of left female breast: Secondary | ICD-10-CM | POA: Diagnosis not present

## 2019-08-20 DIAGNOSIS — C50912 Malignant neoplasm of unspecified site of left female breast: Secondary | ICD-10-CM | POA: Diagnosis not present

## 2019-08-20 DIAGNOSIS — Z8249 Family history of ischemic heart disease and other diseases of the circulatory system: Secondary | ICD-10-CM | POA: Diagnosis not present

## 2019-08-20 DIAGNOSIS — Z170 Estrogen receptor positive status [ER+]: Secondary | ICD-10-CM

## 2019-08-20 DIAGNOSIS — C7802 Secondary malignant neoplasm of left lung: Secondary | ICD-10-CM | POA: Diagnosis not present

## 2019-08-20 DIAGNOSIS — Z87442 Personal history of urinary calculi: Secondary | ICD-10-CM | POA: Diagnosis not present

## 2019-08-20 DIAGNOSIS — Z79899 Other long term (current) drug therapy: Secondary | ICD-10-CM | POA: Diagnosis not present

## 2019-08-20 DIAGNOSIS — G479 Sleep disorder, unspecified: Secondary | ICD-10-CM | POA: Diagnosis not present

## 2019-08-20 DIAGNOSIS — Z171 Estrogen receptor negative status [ER-]: Secondary | ICD-10-CM | POA: Diagnosis not present

## 2019-08-20 DIAGNOSIS — Z9012 Acquired absence of left breast and nipple: Secondary | ICD-10-CM | POA: Diagnosis not present

## 2019-08-20 DIAGNOSIS — M25552 Pain in left hip: Secondary | ICD-10-CM | POA: Diagnosis not present

## 2019-08-20 DIAGNOSIS — Z836 Family history of other diseases of the respiratory system: Secondary | ICD-10-CM | POA: Diagnosis not present

## 2019-08-20 DIAGNOSIS — Z5112 Encounter for antineoplastic immunotherapy: Secondary | ICD-10-CM | POA: Diagnosis not present

## 2019-08-20 LAB — COMPREHENSIVE METABOLIC PANEL
ALT: 40 U/L (ref 0–44)
AST: 48 U/L — ABNORMAL HIGH (ref 15–41)
Albumin: 3.5 g/dL (ref 3.5–5.0)
Alkaline Phosphatase: 54 U/L (ref 38–126)
Anion gap: 11 (ref 5–15)
BUN: 30 mg/dL — ABNORMAL HIGH (ref 8–23)
CO2: 25 mmol/L (ref 22–32)
Calcium: 9.1 mg/dL (ref 8.9–10.3)
Chloride: 104 mmol/L (ref 98–111)
Creatinine, Ser: 1.29 mg/dL — ABNORMAL HIGH (ref 0.44–1.00)
GFR calc Af Amer: 42 mL/min — ABNORMAL LOW (ref >60)
GFR calc non Af Amer: 36 mL/min — ABNORMAL LOW (ref >60)
Glucose, Bld: 128 mg/dL — ABNORMAL HIGH (ref 70–99)
Potassium: 4.3 mmol/L (ref 3.5–5.1)
Sodium: 140 mmol/L (ref 135–145)
Total Bilirubin: 0.5 mg/dL (ref 0.3–1.2)
Total Protein: 6.4 g/dL — ABNORMAL LOW (ref 6.5–8.1)

## 2019-08-20 LAB — CBC WITH DIFFERENTIAL/PLATELET
Abs Immature Granulocytes: 0.01 10*3/uL (ref 0.00–0.07)
Basophils Absolute: 0.1 10*3/uL (ref 0.0–0.1)
Basophils Relative: 1 %
Eosinophils Absolute: 0.2 10*3/uL (ref 0.0–0.5)
Eosinophils Relative: 5 %
HCT: 35.4 % — ABNORMAL LOW (ref 36.0–46.0)
Hemoglobin: 10.9 g/dL — ABNORMAL LOW (ref 12.0–15.0)
Immature Granulocytes: 0 %
Lymphocytes Relative: 16 %
Lymphs Abs: 0.8 10*3/uL (ref 0.7–4.0)
MCH: 29.6 pg (ref 26.0–34.0)
MCHC: 30.8 g/dL (ref 30.0–36.0)
MCV: 96.2 fL (ref 80.0–100.0)
Monocytes Absolute: 0.4 10*3/uL (ref 0.1–1.0)
Monocytes Relative: 9 %
Neutro Abs: 3.3 10*3/uL (ref 1.7–7.7)
Neutrophils Relative %: 69 %
Platelets: 192 10*3/uL (ref 150–400)
RBC: 3.68 MIL/uL — ABNORMAL LOW (ref 3.87–5.11)
RDW: 13.8 % (ref 11.5–15.5)
WBC: 4.8 10*3/uL (ref 4.0–10.5)
nRBC: 0 % (ref 0.0–0.2)

## 2019-08-20 MED ORDER — HEPARIN SOD (PORK) LOCK FLUSH 100 UNIT/ML IV SOLN
500.0000 [IU] | Freq: Once | INTRAVENOUS | Status: AC | PRN
  Administered 2019-08-20: 12:00:00 500 [IU]

## 2019-08-20 MED ORDER — SODIUM CHLORIDE 0.9% FLUSH
10.0000 mL | INTRAVENOUS | Status: DC | PRN
  Administered 2019-08-20: 12:00:00 10 mL

## 2019-08-20 MED ORDER — SODIUM CHLORIDE 0.9 % IV SOLN
3.0000 mg/kg | Freq: Once | INTRAVENOUS | Status: AC
  Administered 2019-08-20: 180 mg via INTRAVENOUS
  Filled 2019-08-20: qty 9

## 2019-08-20 MED ORDER — ACETAMINOPHEN 325 MG PO TABS
650.0000 mg | ORAL_TABLET | Freq: Once | ORAL | Status: AC
  Administered 2019-08-20: 11:00:00 650 mg via ORAL

## 2019-08-20 MED ORDER — ZOLPIDEM TARTRATE 5 MG PO TABS: 5.0000 mg | ORAL_TABLET | Freq: Every evening | ORAL | 0 refills | Status: DC | PRN

## 2019-08-20 MED ORDER — ACETAMINOPHEN 325 MG PO TABS
ORAL_TABLET | ORAL | Status: AC
  Filled 2019-08-20: qty 2

## 2019-08-20 MED ORDER — DIPHENHYDRAMINE HCL 25 MG PO CAPS
ORAL_CAPSULE | ORAL | Status: AC
  Filled 2019-08-20: qty 1

## 2019-08-20 MED ORDER — DIPHENHYDRAMINE HCL 25 MG PO CAPS
25.0000 mg | ORAL_CAPSULE | Freq: Once | ORAL | Status: AC
  Administered 2019-08-20: 25 mg via ORAL

## 2019-08-20 MED ORDER — PROCHLORPERAZINE MALEATE 10 MG PO TABS
ORAL_TABLET | ORAL | Status: AC
  Filled 2019-08-20: qty 1

## 2019-08-20 MED ORDER — PROCHLORPERAZINE MALEATE 10 MG PO TABS: 10.0000 mg | ORAL_TABLET | Freq: Four times a day (QID) | ORAL | Status: DC | PRN

## 2019-08-20 MED ORDER — SODIUM CHLORIDE 0.9 % IV SOLN: Freq: Once | INTRAVENOUS | Status: AC

## 2019-08-20 MED ORDER — PROCHLORPERAZINE MALEATE 10 MG PO TABS
10.0000 mg | ORAL_TABLET | Freq: Once | ORAL | Status: AC
  Administered 2019-08-20: 11:00:00 10 mg via ORAL

## 2019-08-20 NOTE — Progress Notes (Signed)
Patient presents today for treatment and follow up appointment with Dr. Delton Coombes. Labs reviewed. Message received to proceed with treatment today.   Treatment given today per MD orders. Tolerated infusion without adverse affects. Vital signs stable. No complaints at this time. Discharged from clinic ambulatory. F/U with [facility] as scheduled.

## 2019-08-20 NOTE — Patient Instructions (Signed)
[place] at [facility] Discharge Instructions  You were seen today by Dr. Delton Coombes. He went over your recent lab results. Repeat CT scans in 3 weeks. He will see you back in 3 weeks for labs and follow up.   Thank you for choosing [place] at [facility] to provide your oncology and hematology care.  To afford each patient quality time with our provider, please arrive at least 15 minutes before your scheduled appointment time.   If you have a lab appointment with the [facility] please come in thru the  Main Entrance and check in at the main information desk  You need to re-schedule your appointment should you arrive 10 or more minutes late.  We strive to give you quality time with our providers, and arriving late affects you and other patients whose appointments are after yours.  Also, if you no show three or more times for appointments you may be dismissed from the clinic at the providers discretion.     Again, thank you for choosing [facility].  Our hope is that these requests will decrease the amount of time that you wait before being seen by our physicians.       _____________________________________________________________  Should you have questions after your visit to [facility], please contact our office at [phone] between the hours of 8:00 a.m. and 4:30 p.m.  Voicemails left after 4:00 p.m. will not be returned until the following business day.  For prescription refill requests, have your pharmacy contact our office and allow 72 hours.    Cancer Center Support Programs:   > Cancer Support Group  2nd Tuesday of the month 1pm-2pm, Journey Room

## 2019-08-20 NOTE — Patient Instructions (Signed)
[facility] Discharge Instructions for Patients Receiving Chemotherapy  Today you received the following chemotherapy agents   To help prevent nausea and vomiting after your treatment, we encourage you to take your nausea medication   If you develop nausea and vomiting that is not controlled by your nausea medication, call the clinic.   BELOW ARE SYMPTOMS THAT SHOULD BE REPORTED IMMEDIATELY:  *FEVER GREATER THAN 100.5 F  *CHILLS WITH OR WITHOUT FEVER  NAUSEA AND VOMITING THAT IS NOT CONTROLLED WITH YOUR NAUSEA MEDICATION  *UNUSUAL SHORTNESS OF BREATH  *UNUSUAL BRUISING OR BLEEDING  TENDERNESS IN MOUTH AND THROAT WITH OR WITHOUT PRESENCE OF ULCERS  *URINARY PROBLEMS  *BOWEL PROBLEMS  UNUSUAL RASH Items with * indicate a potential emergency and should be followed up as soon as possible.  Feel free to call the clinic should you have any questions or concerns. The clinic phone number is [phone].  Please show the CHEMO ALERT CARD at check-in to the Emergency Department and triage nurse.   

## 2019-08-20 NOTE — Assessment & Plan Note (Addendum)
1.  Metastatic HER-2 positive left breast cancer: -Left modified radical mastectomy on 05/28/2018, ER/PR negative, HER-2 positive, 3 out of 3 lymph nodes positive -PET scan on 07/23/2018 shows few scattered mildly hypermetabolic pulmonary nodules, hypermetabolic left axillary, right paratracheal, right hilar, subcarinal adenopathy compatible with metastatic disease. -4 cycles of single agent Herceptin every 3 weeks from 08/09/2018 through 10/18/2018 with progression. -Perjeta and Herceptin from 11/15/2018 through 02/16/2027 PET scan on 02/25/2019 showing progression. -7 cycles of Kadcyla from 03/12/2019 through 07/30/2019.   -PET scan on 06/10/2019 showed increased hypermetabolism in the thoracic nodes but similar size.  Pulmonary nodules have stable hypermetabolism but slight increase in size, largest measuring 0.3 cm.  We have recommended continuation of therapy. -She could not get PET scan as her insurance declined it.  She did not experience any major side effects from Kadcyla. -We will proceed with her next cycle today.  We have reviewed her blood work.  Tumor marker CA 15-3 was 7.2 on 07/30/2019.  I plan to change her scans to CT CAP prior to next visit in 3 weeks.  2.  Left hip pain: -She has left hip pain on weightbearing.  She was evaluated by Dr. [name]. -I have reviewed MRI of the lumbar spine on 07/24/2019 without contrast at [facility] showed multilevel degenerative changes.  Moderate canal stenosis at L3-L4.  Severe right and moderate left foraminal stenosis at T12-L4. -No evidence of malignancy seen. -Dr. [name] has referred her to [facility] for injection.  3.  High risk drug monitoring: -I reviewed echo from 07/02/2019 which showed EF 60 to 65%.  4.  Difficulty falling asleep: -This started after Kadcyla.  She had tried melatonin without help. -I will start her on Ambien 5 mg at bedtime.

## 2019-08-20 NOTE — Patient Instructions (Signed)
[facility] at [facility] Discharge Instructions  Labs drawn from portacath today   Thank you for choosing [facility] at [facility] to provide your oncology and hematology care.  To afford each patient quality time with our provider, please arrive at least 15 minutes before your scheduled appointment time.   If you have a lab appointment with the Cancer Center please come in thru the Main Entrance and check in at the main information desk.  You need to re-schedule your appointment should you arrive 10 or more minutes late.  We strive to give you quality time with our providers, and arriving late affects you and other patients whose appointments are after yours.  Also, if you no show three or more times for appointments you may be dismissed from the clinic at the providers discretion.     Again, thank you for choosing [facility].  Our hope is that these requests will decrease the amount of time that you wait before being seen by our physicians.       _____________________________________________________________  Should you have questions after your visit to [facility], please contact our office at [phone] between the hours of 8:00 a.m. and 4:30 p.m.  Voicemails left after 4:00 p.m. will not be returned until the following business day.  For prescription refill requests, have your pharmacy contact our office and allow 72 hours.    Due to Covid, you will need to wear a mask upon entering the hospital. If you do not have a mask, a mask will be given to you at the Main Entrance upon arrival. For doctor visits, patients may have 1 support person with them. For treatment visits, patients can not have anyone with them due to social distancing guidelines and our immunocompromised population.     

## 2019-08-20 NOTE — Progress Notes (Signed)
[place] [place], [place] [address]   CLINIC:  Medical Oncology/Hematology  PCP:  [name], MD [place] [address] [phone]   REASON FOR VISIT:  Follow-up for Breast Cancer   CURRENT THERAPY: Kadcyla.  BRIEF ONCOLOGIC HISTORY:  Oncology History  Invasive ductal carcinoma of left breast ([place])  03/30/2017 Initial Diagnosis   Invasive ductal carcinoma of left breast ([place])   08/09/2018 - 03/20/2019 Chemotherapy   The patient had trastuzumab (HERCEPTIN) 500 mg in sodium chloride 0.9 % 250 mL chemo infusion, 525 mg, Intravenous,  Once, 10 of 10 cycles Administration: 500 mg (08/09/2018), 350 mg (09/06/2018), 350 mg (09/27/2018), 378 mg (10/18/2018), 378 mg (11/15/2018), 378 mg (12/06/2018), 378 mg (12/28/2018), 378 mg (01/18/2019), 378 mg (02/08/2019) pertuzumab (PERJETA) 840 mg in sodium chloride 0.9 % 250 mL chemo infusion, 840 mg (100 % of original dose 840 mg), Intravenous, Once, 6 of 6 cycles Dose modification: 840 mg (original dose 840 mg, Cycle 5), 420 mg (original dose 420 mg, Cycle 6) Administration: 840 mg (11/15/2018), 420 mg (12/06/2018), 420 mg (12/28/2018), 420 mg (01/18/2019), 420 mg (02/08/2019)  for chemotherapy treatment.    09/18/2018 Genetic Testing   Negative genetic testing for the breast and gyn cancer panel.  The Breast/GYN gene panel offered by [facility] includes sequencing and rearrangement analysis for the following 23 genes:  ATM, BRCA1, BRCA2, BRIP1, CDH1, CHEK2, EPCAM, MLH1, MSH2, MSH6, NBN, NF1, PALB2, PMS2, PTEN, RAD51C, RAD51D, STK11, and TP53.   The report date is 09/18/2018.   03/12/2019 -  Chemotherapy   The patient had ado-trastuzumab emtansine (KADCYLA) 140 mg in sodium chloride 0.9 % 250 mL chemo infusion, 2.4 mg/kg = 140 mg (100 % of original dose 2.4 mg/kg), Intravenous, Once, 8 of 9 cycles Dose modification: 2.4 mg/kg (original dose 2.4 mg/kg, Cycle 1, Reason: Patient Age), 3 mg/kg (original dose 2.4  mg/kg, Cycle 3, Reason: Provider Judgment) Administration: 140 mg (03/12/2019), 180 mg (04/23/2019), 180 mg (06/18/2019), 180 mg (04/02/2019), 180 mg (05/28/2019), 180 mg (07/09/2019), 180 mg (07/30/2019), 180 mg (08/20/2019)  for chemotherapy treatment.       CANCER STAGING: Cancer Staging No matching staging information was found for the patient.   INTERVAL HISTORY:  Ms. North 85 y.o. female seen for follow-up and cycle 8 of chemotherapy.  She reports appetite of 25% and energy levels of 75%.  She continues to have left hip pain.  She is awaiting injection in [facility].  She reported having sleep problems.  She tried taking melatonin which did not help.  She denies any symptoms of PND or orthopnea.  Cycle 7 was on 07/30/2019.  Denies any other pains.  REVIEW OF SYSTEMS:  Review of Systems  Psychiatric/Behavioral: Positive for sleep disturbance.  All other systems reviewed and are negative.    PAST MEDICAL/SURGICAL HISTORY:  Past Medical History:  Diagnosis Date  . Cancer ([place])    left breast  . History of kidney stones   . Hypercholesteremia   . Hypertension    Past Surgical History:  Procedure Laterality Date  . APPENDECTOMY    . brain cyst removed  2011  . BREAST BIOPSY Left 03/28/2018   Procedure: BREAST BIOPSY;  Surgeon: [name], MD;  Location: [facility];  Service: General;  Laterality: Left;  . CATARACT EXTRACTION W/PHACO Left 11/09/2015   Procedure: CATARACT EXTRACTION PHACO AND INTRAOCULAR LENS PLACEMENT LEFT EYE cde=8.97;  Surgeon: [name], MD;  Location: [facility];  Service: Ophthalmology;  Laterality: Left;  . CATARACT EXTRACTION W/PHACO Right 02/04/2019   Procedure: CATARACT EXTRACTION PHACO AND INTRAOCULAR LENS PLACEMENT (IOC);  Surgeon: [name], MD;  Location: [facility];  Service: Ophthalmology;  Laterality: Right;  CDE: 15.19  . EYE SURGERY     KPE left  . MASTECTOMY MODIFIED RADICAL Left 05/28/2018   Procedure: LEFT MODIFIED RADICAL MASTECTOMY;  Surgeon:  [name], MD;  Location: [facility];  Service: General;  Laterality: Left;  Marland Kitchen MASTECTOMY, PARTIAL Left 04/24/2017   Procedure: MASTECTOMY PARTIAL;  Surgeon: [name], MD;  Location: [facility];  Service: General;  Laterality: Left;  . PORTACATH PLACEMENT Right 08/08/2018   Procedure: INSERTION PORT-A-CATH;  Surgeon: [name], MD;  Location: [facility];  Service: General;  Laterality: Right;  . TONSILLECTOMY    . TONSILLECTOMY AND ADENOIDECTOMY       SOCIAL HISTORY:  Social History   Socioeconomic History  . Marital status: Widowed    Spouse name: Not on file  . Number of children: Not on file  . Years of education: Not on file  . Highest education level: Not on file  Occupational History  . Not on file  Tobacco Use  . Smoking status: Never Smoker  . Smokeless tobacco: Never Used  Substance and Sexual Activity  . Alcohol use: No  . Drug use: No  . Sexual activity: Not Currently    Partners: Male  Other Topics Concern  . Not on file  Social History Narrative  . Not on file   Social Determinants of Health   Financial Resource Strain:   . Difficulty of Paying Living Expenses: Not on file  Food Insecurity:   . Worried About Charity fundraiser in the Last Year: Not on file  . Ran Out of Food in the Last Year: Not on file  Transportation Needs:   . Lack of Transportation (Medical): Not on file  . Lack of Transportation (Non-Medical): Not on file  Physical Activity:   . Days of Exercise per Week: Not on file  . Minutes of Exercise per Session: Not on file  Stress:   . Feeling of Stress : Not on file  Social Connections:   . Frequency of Communication with Friends and Family: Not on file  . Frequency of Social Gatherings with Friends and Family: Not on file  . Attends Religious Services: Not on file  . Active Member of Clubs or Organizations: Not on file  . Attends Archivist Meetings: Not on file  . Marital Status: Not on file  Intimate Partner Violence:     . Fear of Current or Ex-Partner: Not on file  . Emotionally Abused: Not on file  . Physically Abused: Not on file  . Sexually Abused: Not on file    FAMILY HISTORY:  Family History  Problem Relation Age of Onset  . Heart failure Mother   . Heart failure Father   . Congestive Heart Failure Brother   . Tuberculosis Paternal Uncle   . Tuberculosis Maternal Grandmother   . Tuberculosis Maternal Grandfather   . Congestive Heart Failure Brother     CURRENT MEDICATIONS:  Outpatient Encounter Medications as of 08/20/2019  Medication Sig  . Ado-Trastuzumab Emtansine (KADCYLA IV) Inject into the vein every 21 ( twenty-one) days.  . ALPRAZolam (XANAX) 0.5 MG tablet Take 1 tablet 30 min before PET  . amLODipine (NORVASC) 5 MG tablet Take 5 mg by mouth daily.  Marland Kitchen aspirin EC 81 MG tablet Take 81 mg by  mouth 2 (two) times a day.  . ferrous sulfate 325 (65 FE) MG tablet Take 325 mg by mouth daily with breakfast.  . labetalol (NORMODYNE) 100 MG tablet Take 1 tablet (100 mg total) by mouth 2 (two) times daily. (Patient taking differently: Take 100 mg by mouth daily. )  . lidocaine-prilocaine (EMLA) cream Apply 1 application topically as needed (port access).  . LORazepam (ATIVAN) 1 MG tablet Take 1 mg by mouth daily.   . naproxen sodium (ALEVE) 220 MG tablet Take 220 mg by mouth daily as needed (for headache or pain).  Marland Kitchen oxybutynin (DITROPAN) 5 MG tablet Take 5 mg by mouth daily.   . simvastatin (ZOCOR) 40 MG tablet Take 40 mg by mouth daily.  . [DISCONTINUED] prochlorperazine (COMPAZINE) 10 MG tablet Take 1 tablet (10 mg total) by mouth every 6 (six) hours as needed (Nausea or vomiting). (Patient taking differently: Take 10 mg by mouth daily. )   No facility-administered encounter medications on file as of 08/20/2019.    ALLERGIES:  No Known Allergies   PHYSICAL EXAM:  ECOG Performance status: 1  Vitals:   08/20/19 0919  BP: (!) 115/53  Pulse: (!) 54  Resp: 18  Temp: (!) 97.5 F  (36.4 C)  SpO2: 99%   Filed Weights   08/20/19 0919  Weight: 122 lb (55.3 kg)    Physical Exam Constitutional:      Appearance: Normal appearance.  HENT:     Head: Normocephalic.     Nose: Nose normal.  Eyes:     Conjunctiva/sclera: Conjunctivae normal.  Cardiovascular:     Rate and Rhythm: Normal rate and regular rhythm.     Pulses: Normal pulses.     Heart sounds: Normal heart sounds.  Pulmonary:     Effort: Pulmonary effort is normal.     Breath sounds: Normal breath sounds.  Abdominal:     General: Bowel sounds are normal.  Musculoskeletal:        General: Normal range of motion.     Cervical back: Normal range of motion.  Skin:    General: Skin is warm and dry.  Neurological:     General: No focal deficit present.     Mental Status: She is alert and oriented to person, place, and time.  Psychiatric:        Mood and Affect: Mood normal.        Behavior: Behavior normal.        Thought Content: Thought content normal.        Judgment: Judgment normal.      LABORATORY DATA:  I have reviewed the labs as listed.  CBC    Component Value Date/Time   WBC 4.8 08/20/2019 0909   RBC 3.68 (L) 08/20/2019 0909   HGB 10.9 (L) 08/20/2019 0909   HCT 35.4 (L) 08/20/2019 0909   PLT 192 08/20/2019 0909   MCV 96.2 08/20/2019 0909   MCH 29.6 08/20/2019 0909   MCHC 30.8 08/20/2019 0909   RDW 13.8 08/20/2019 0909   LYMPHSABS 0.8 08/20/2019 0909   MONOABS 0.4 08/20/2019 0909   EOSABS 0.2 08/20/2019 0909   BASOSABS 0.1 08/20/2019 0909   CMP Latest Ref Rng & Units 08/20/2019 07/30/2019 07/09/2019  Glucose 70 - 99 mg/dL 128(H) 104(H) 110(H)  BUN 8 - 23 mg/dL 30(H) 28(H) 32(H)  Creatinine 0.44 - 1.00 mg/dL 1.29(H) 1.14(H) 1.23(H)  Sodium 135 - 145 mmol/L 140 142 141  Potassium 3.5 - 5.1 mmol/L 4.3 4.1 4.3  Chloride  98 - 111 mmol/L 104 110 109  CO2 22 - 32 mmol/L '25 24 24  ' Calcium 8.9 - 10.3 mg/dL 9.1 9.1 9.2  Total Protein 6.5 - 8.1 g/dL 6.4(L) 6.4(L) 6.5  Total  Bilirubin 0.3 - 1.2 mg/dL 0.5 0.2(L) 0.7  Alkaline Phos 38 - 126 U/L 54 56 58  AST 15 - 41 U/L 48(H) 39 30  ALT 0 - 44 U/L 40 33 27   I have independently reviewed her PET CT scan and discussed with the patient.   ASSESSMENT & PLAN:   Invasive ductal carcinoma of left breast ([place]) 1.  Metastatic HER-2 positive left breast cancer: -Left modified radical mastectomy on 05/28/2018, ER/PR negative, HER-2 positive, 3 out of 3 lymph nodes positive -PET scan on 07/23/2018 shows few scattered mildly hypermetabolic pulmonary nodules, hypermetabolic left axillary, right paratracheal, right hilar, subcarinal adenopathy compatible with metastatic disease. -4 cycles of single agent Herceptin every 3 weeks from 08/09/2018 through 10/18/2018 with progression. -Perjeta and Herceptin from 11/15/2018 through 02/16/2027 PET scan on 02/25/2019 showing progression. -7 cycles of Kadcyla from 03/12/2019 through 07/30/2019.   -PET scan on 06/10/2019 showed increased hypermetabolism in the thoracic nodes but similar size.  Pulmonary nodules have stable hypermetabolism but slight increase in size, largest measuring 0.3 cm.  We have recommended continuation of therapy. -She could not get PET scan as her insurance declined it.  She did not experience any major side effects from Kadcyla. -We will proceed with her next cycle today.  We have reviewed her blood work.  Tumor marker CA 15-3 was 7.2 on 07/30/2019.  I plan to change her scans to CT CAP prior to next visit in 3 weeks.  2.  Left hip pain: -She has left hip pain on weightbearing.  She was evaluated by Dr. [name]. -I have reviewed MRI of the lumbar spine on 07/24/2019 without contrast at [facility] showed multilevel degenerative changes.  Moderate canal stenosis at L3-L4.  Severe right and moderate left foraminal stenosis at T12-L4. -No evidence of malignancy seen. -Dr. [name] has referred her to [facility] for injection.  3.  High risk drug  monitoring: -I reviewed echo from 07/02/2019 which showed EF 60 to 65%.  4.  Difficulty falling asleep: -This started after Kadcyla.  She had tried melatonin without help. -I will start her on Ambien 5 mg at bedtime.  Total time spent is 25 minutes with more than 50% of the time spent face-to-face discussing treatment plan, counseling and coordination of care.   Orders placed this encounter:  Orders Placed This Encounter  Procedures  . CT Chest W Contrast  . CT Abdomen Pelvis W Contrast     Derek Jack, MD  [place] [phone]

## 2019-08-21 ENCOUNTER — Inpatient Hospital Stay: Admission: RE | Admit: 2019-08-21 | Payer: Medicare Other | Source: Ambulatory Visit

## 2019-08-26 ENCOUNTER — Other Ambulatory Visit: Payer: Self-pay

## 2019-08-26 ENCOUNTER — Other Ambulatory Visit: Payer: Self-pay | Admitting: Orthopedic Surgery

## 2019-08-26 ENCOUNTER — Ambulatory Visit
Admission: RE | Admit: 2019-08-26 | Discharge: 2019-08-26 | Disposition: A | Discharge status: Home / Self Care | Payer: Medicare Other | Source: Ambulatory Visit | Attending: Orthopedic Surgery | Admitting: Orthopedic Surgery

## 2019-08-26 DIAGNOSIS — M5417 Radiculopathy, lumbosacral region: Secondary | ICD-10-CM | POA: Diagnosis not present

## 2019-08-26 DIAGNOSIS — M5410 Radiculopathy, site unspecified: Secondary | ICD-10-CM

## 2019-08-26 MED ORDER — METHYLPREDNISOLONE ACETATE 40 MG/ML INJ SUSP (RADIOLOG
120.0000 mg | Freq: Once | INTRAMUSCULAR | Status: AC
  Administered 2019-08-26: 120 mg via EPIDURAL

## 2019-08-26 MED ORDER — IOPAMIDOL (ISOVUE-M 300) INJECTION 61%
1.0000 mL | Freq: Once | INTRAMUSCULAR | Status: AC | PRN
  Administered 2019-08-26: 1 mL via EPIDURAL

## 2019-08-26 NOTE — Discharge Instructions (Signed)

## 2019-09-01 ENCOUNTER — Inpatient Hospital Stay (HOSPITAL_COMMUNITY)
Admission: EM | Admit: 2019-09-01 | Discharge: 2019-09-07 | DRG: 481 | Disposition: A | Discharge status: Skilled Nursing Facility | Payer: Medicare Other | Attending: Family Medicine | Admitting: Family Medicine

## 2019-09-01 ENCOUNTER — Emergency Department (HOSPITAL_COMMUNITY): Payer: Medicare Other

## 2019-09-01 ENCOUNTER — Inpatient Hospital Stay (HOSPITAL_COMMUNITY): Payer: Medicare Other

## 2019-09-01 ENCOUNTER — Other Ambulatory Visit: Payer: Self-pay

## 2019-09-01 DIAGNOSIS — D62 Acute posthemorrhagic anemia: Secondary | ICD-10-CM | POA: Diagnosis not present

## 2019-09-01 DIAGNOSIS — M25519 Pain in unspecified shoulder: Secondary | ICD-10-CM

## 2019-09-01 DIAGNOSIS — S72009A Fracture of unspecified part of neck of unspecified femur, initial encounter for closed fracture: Secondary | ICD-10-CM | POA: Diagnosis present

## 2019-09-01 DIAGNOSIS — S4992XA Unspecified injury of left shoulder and upper arm, initial encounter: Secondary | ICD-10-CM | POA: Diagnosis not present

## 2019-09-01 DIAGNOSIS — R001 Bradycardia, unspecified: Secondary | ICD-10-CM | POA: Diagnosis not present

## 2019-09-01 DIAGNOSIS — Z7982 Long term (current) use of aspirin: Secondary | ICD-10-CM

## 2019-09-01 DIAGNOSIS — M47816 Spondylosis without myelopathy or radiculopathy, lumbar region: Secondary | ICD-10-CM | POA: Diagnosis not present

## 2019-09-01 DIAGNOSIS — C50912 Malignant neoplasm of unspecified site of left female breast: Secondary | ICD-10-CM | POA: Diagnosis not present

## 2019-09-01 DIAGNOSIS — Z9012 Acquired absence of left breast and nipple: Secondary | ICD-10-CM | POA: Diagnosis not present

## 2019-09-01 DIAGNOSIS — Y92012 Bathroom of single-family (private) house as the place of occurrence of the external cause: Secondary | ICD-10-CM | POA: Diagnosis not present

## 2019-09-01 DIAGNOSIS — S72102A Unspecified trochanteric fracture of left femur, initial encounter for closed fracture: Secondary | ICD-10-CM

## 2019-09-01 DIAGNOSIS — I10 Essential (primary) hypertension: Secondary | ICD-10-CM | POA: Diagnosis not present

## 2019-09-01 DIAGNOSIS — N182 Chronic kidney disease, stage 2 (mild): Secondary | ICD-10-CM | POA: Diagnosis not present

## 2019-09-01 DIAGNOSIS — S72002A Fracture of unspecified part of neck of left femur, initial encounter for closed fracture: Secondary | ICD-10-CM | POA: Diagnosis not present

## 2019-09-01 DIAGNOSIS — S72142A Displaced intertrochanteric fracture of left femur, initial encounter for closed fracture: Secondary | ICD-10-CM | POA: Diagnosis not present

## 2019-09-01 DIAGNOSIS — E7800 Pure hypercholesterolemia, unspecified: Secondary | ICD-10-CM | POA: Diagnosis present

## 2019-09-01 DIAGNOSIS — M2550 Pain in unspecified joint: Secondary | ICD-10-CM | POA: Diagnosis not present

## 2019-09-01 DIAGNOSIS — M47817 Spondylosis without myelopathy or radiculopathy, lumbosacral region: Secondary | ICD-10-CM | POA: Diagnosis present

## 2019-09-01 DIAGNOSIS — M25552 Pain in left hip: Secondary | ICD-10-CM | POA: Diagnosis not present

## 2019-09-01 DIAGNOSIS — Z8249 Family history of ischemic heart disease and other diseases of the circulatory system: Secondary | ICD-10-CM | POA: Diagnosis not present

## 2019-09-01 DIAGNOSIS — Z743 Need for continuous supervision: Secondary | ICD-10-CM | POA: Diagnosis not present

## 2019-09-01 DIAGNOSIS — M8580 Other specified disorders of bone density and structure, unspecified site: Secondary | ICD-10-CM | POA: Diagnosis not present

## 2019-09-01 DIAGNOSIS — S72142D Displaced intertrochanteric fracture of left femur, subsequent encounter for closed fracture with routine healing: Secondary | ICD-10-CM | POA: Diagnosis not present

## 2019-09-01 DIAGNOSIS — Z419 Encounter for procedure for purposes other than remedying health state, unspecified: Secondary | ICD-10-CM

## 2019-09-01 DIAGNOSIS — W1811XA Fall from or off toilet without subsequent striking against object, initial encounter: Secondary | ICD-10-CM | POA: Diagnosis present

## 2019-09-01 DIAGNOSIS — Z20822 Contact with and (suspected) exposure to covid-19: Secondary | ICD-10-CM | POA: Diagnosis present

## 2019-09-01 DIAGNOSIS — E785 Hyperlipidemia, unspecified: Secondary | ICD-10-CM | POA: Diagnosis present

## 2019-09-01 DIAGNOSIS — R531 Weakness: Secondary | ICD-10-CM | POA: Diagnosis not present

## 2019-09-01 DIAGNOSIS — I129 Hypertensive chronic kidney disease with stage 1 through stage 4 chronic kidney disease, or unspecified chronic kidney disease: Secondary | ICD-10-CM | POA: Diagnosis not present

## 2019-09-01 DIAGNOSIS — M25512 Pain in left shoulder: Secondary | ICD-10-CM | POA: Diagnosis not present

## 2019-09-01 DIAGNOSIS — K5900 Constipation, unspecified: Secondary | ICD-10-CM | POA: Diagnosis not present

## 2019-09-01 DIAGNOSIS — W19XXXA Unspecified fall, initial encounter: Secondary | ICD-10-CM | POA: Diagnosis not present

## 2019-09-01 DIAGNOSIS — M6281 Muscle weakness (generalized): Secondary | ICD-10-CM | POA: Diagnosis not present

## 2019-09-01 DIAGNOSIS — R339 Retention of urine, unspecified: Secondary | ICD-10-CM | POA: Diagnosis not present

## 2019-09-01 DIAGNOSIS — C50512 Malignant neoplasm of lower-outer quadrant of left female breast: Secondary | ICD-10-CM | POA: Diagnosis present

## 2019-09-01 DIAGNOSIS — Z03818 Encounter for observation for suspected exposure to other biological agents ruled out: Secondary | ICD-10-CM | POA: Diagnosis not present

## 2019-09-01 DIAGNOSIS — R2689 Other abnormalities of gait and mobility: Secondary | ICD-10-CM | POA: Diagnosis not present

## 2019-09-01 DIAGNOSIS — S3992XA Unspecified injury of lower back, initial encounter: Secondary | ICD-10-CM | POA: Diagnosis not present

## 2019-09-01 DIAGNOSIS — Z7401 Bed confinement status: Secondary | ICD-10-CM | POA: Diagnosis not present

## 2019-09-01 DIAGNOSIS — I959 Hypotension, unspecified: Secondary | ICD-10-CM | POA: Diagnosis not present

## 2019-09-01 LAB — CBC WITH DIFFERENTIAL/PLATELET
Abs Immature Granulocytes: 0.09 K/uL — ABNORMAL HIGH (ref 0.00–0.07)
Basophils Absolute: 0 K/uL (ref 0.0–0.1)
Basophils Relative: 0 %
Eosinophils Absolute: 0 K/uL (ref 0.0–0.5)
Eosinophils Relative: 0 %
HCT: 37.2 % (ref 36.0–46.0)
Hemoglobin: 11.9 g/dL — ABNORMAL LOW (ref 12.0–15.0)
Immature Granulocytes: 1 %
Lymphocytes Relative: 8 %
Lymphs Abs: 1 K/uL (ref 0.7–4.0)
MCH: 29.3 pg (ref 26.0–34.0)
MCHC: 32 g/dL (ref 30.0–36.0)
MCV: 91.6 fL (ref 80.0–100.0)
Monocytes Absolute: 1 K/uL (ref 0.1–1.0)
Monocytes Relative: 8 %
Neutro Abs: 9.8 K/uL — ABNORMAL HIGH (ref 1.7–7.7)
Neutrophils Relative %: 83 %
Platelets: 195 K/uL (ref 150–400)
RBC: 4.06 MIL/uL (ref 3.87–5.11)
RDW: 13.8 % (ref 11.5–15.5)
WBC: 11.9 K/uL — ABNORMAL HIGH (ref 4.0–10.5)
nRBC: 0 % (ref 0.0–0.2)

## 2019-09-01 LAB — BASIC METABOLIC PANEL WITH GFR
Anion gap: 12 (ref 5–15)
BUN: 32 mg/dL — ABNORMAL HIGH (ref 8–23)
CO2: 21 mmol/L — ABNORMAL LOW (ref 22–32)
Calcium: 9.1 mg/dL (ref 8.9–10.3)
Chloride: 101 mmol/L (ref 98–111)
Creatinine, Ser: 1.11 mg/dL — ABNORMAL HIGH (ref 0.44–1.00)
GFR calc Af Amer: 50 mL/min — ABNORMAL LOW
GFR calc non Af Amer: 43 mL/min — ABNORMAL LOW
Glucose, Bld: 152 mg/dL — ABNORMAL HIGH (ref 70–99)
Potassium: 4.1 mmol/L (ref 3.5–5.1)
Sodium: 134 mmol/L — ABNORMAL LOW (ref 135–145)

## 2019-09-01 LAB — CBG MONITORING, ED: Glucose-Capillary: 184 mg/dL — ABNORMAL HIGH (ref 70–99)

## 2019-09-01 LAB — RESPIRATORY PANEL BY RT PCR (FLU A&B, COVID)
Influenza A by PCR: NEGATIVE
Influenza B by PCR: NEGATIVE
SARS Coronavirus 2 by RT PCR: NEGATIVE

## 2019-09-01 MED ORDER — MORPHINE SULFATE (PF) 4 MG/ML IV SOLN
4.0000 mg | INTRAVENOUS | Status: DC | PRN
  Administered 2019-09-01: 4 mg via INTRAVENOUS
  Filled 2019-09-01: qty 1

## 2019-09-01 MED ORDER — HYDROMORPHONE HCL 1 MG/ML IJ SOLN
1.0000 mg | Freq: Once | INTRAMUSCULAR | Status: AC
  Administered 2019-09-01: 1 mg via INTRAVENOUS
  Filled 2019-09-01: qty 1

## 2019-09-01 MED ORDER — OXYCODONE HCL 5 MG PO TABS
5.0000 mg | ORAL_TABLET | ORAL | Status: DC | PRN
  Administered 2019-09-02 – 2019-09-03 (×2): 5 mg via ORAL
  Filled 2019-09-01 (×2): qty 1

## 2019-09-01 MED ORDER — ONDANSETRON HCL 4 MG/2ML IJ SOLN
4.0000 mg | Freq: Four times a day (QID) | INTRAMUSCULAR | Status: DC | PRN
  Administered 2019-09-01 – 2019-09-03 (×3): 4 mg via INTRAVENOUS
  Filled 2019-09-01 (×2): qty 2

## 2019-09-01 MED ORDER — ACETAMINOPHEN 325 MG PO TABS: 650.0000 mg | ORAL_TABLET | Freq: Four times a day (QID) | ORAL | Status: DC | PRN

## 2019-09-01 MED ORDER — ACETAMINOPHEN 650 MG RE SUPP: 650.0000 mg | Freq: Four times a day (QID) | RECTAL | Status: DC | PRN

## 2019-09-01 MED ORDER — ONDANSETRON HCL 4 MG PO TABS: 4.0000 mg | ORAL_TABLET | Freq: Four times a day (QID) | ORAL | Status: DC | PRN

## 2019-09-01 NOTE — ED Notes (Signed)
Pt reports she had an injection on  Monday to the affected hip and she was doing so well that she did not require her walker  Then, as she reports "I still don't believe I slid off the toilet".   She is alert, oriented and reports she is pleased at her age she is as sharp as a tack.

## 2019-09-01 NOTE — H&P (Signed)
TRH H&P    Patient Demographics:    Carol Duarte, is a 86 y.o. female  MRN: [id number]  DOB - 1925-07-19  Admit Date - 09/01/2019  Referring MD/NP/PA: Rosemarie Ax  Outpatient Primary MD for the patient is [name], MD  Patient coming from: Home  Chief complaint- Hip pain   HPI:    Carol Duarte  is a 86 y.o. female, with history of hypertension, hyperlipidemia, breast cancer, and more presents to the ED today with a chief complaint of hip pain. Patient is a poor historian who is difficult to keep focused, but she is oriented x4. Patient reports that she started having hip pain about 3 weeks ago and was having difficulty putting weight on her left leg.  This was causing her to have to use a walker for ambulation-per patient.  She reports that she went for an x-ray and an MRI [facility].  In care everywhere there is an MRI that was done in November results are: "Multilevel degenerative changes. Moderate canal stenosis at L3-L4. Severe right and moderate left foraminal stenosis at T12-L1. Moderate neural foraminal stenosis at L1-L2, L2-L3, and L4-L5 on the left."  Patient reports that after that was done she went in for an intra-articular injection.  She reports for the 3 days after that injection she was able to ambulate without her walker, but then started to have pain again just not as intense.  Today she reports that she fell off the commode onto the left side.  She reports that she did not hit her head or lose consciousness, but she does not know why she fell. Preceding the fall, she reports no lightheadedness, no blurry vision, no diaphoresis, no chest pain, and no palpitations.Patient reports that she had sudden onset sharp pain. It did not radiate. She was not able to get up and had to slide herself on her backside over to the door to call for her daughter. When she was still not able to get up, they decided that she needed  to come in to the ED.  The ED provider reports that she spoke with Dr. Alvan Dame and he requests that patient be admitted and transferred to [facility] for operation tomorrow.   Of note - patient does report that she had had a mild, but constant pain in her left shoulder since the fall. She does not think she hit it, but she is not sure.   ED course: T 98.6, P 60, R 18, BP 163/65 O2 97% WBC 12 Hgb 11.9 - at baseline BUN 32 - at baseline Cr 1.11 - at baseline Dilaudid for pain XR hip - Acute, comminuted, displaced left-sided intertrochanteric femur fracture with associated foreshortening and varus angulation XR lumbar spine -Osteopenia. No fracture or dislocation of the lumbar spine. Moderate to severe multilevel disc degenerative disease and osteophytosis. Pre-op covid test pending EKG sinus brady with HR 58 and QTc 440     Review of systems:    In addition to the HPI above,  No Fever-chills, No Headache, No changes with Vision or  hearing, No problems swallowing food or Liquids, No Chest pain, Cough or Shortness of Breath, No Abdominal pain, No Nausea or Vomiting, No Blood in stool or Urine No dysuria, No new skin rashes or bruises, No new joints pains-aches,  Pain in extremities as described above No recent weight gain or loss, No polyuria, polydypsia or polyphagia, No significant Mental Stressors.  All other systems reviewed and are negative.    Past History of the following :    Past Medical History:  Diagnosis Date  . Cancer ([place])    left breast  . History of kidney stones   . Hypercholesteremia   . Hypertension       Past Surgical History:  Procedure Laterality Date  . APPENDECTOMY    . brain cyst removed  2011  . BREAST BIOPSY Left 03/28/2018   Procedure: BREAST BIOPSY;  Surgeon: [name], MD;  Location: [facility];  Service: General;  Laterality: Left;  . CATARACT EXTRACTION W/PHACO Left 11/09/2015   Procedure: CATARACT EXTRACTION PHACO AND INTRAOCULAR  LENS PLACEMENT LEFT EYE cde=8.97;  Surgeon: [name], MD;  Location: [facility];  Service: Ophthalmology;  Laterality: Left;  . CATARACT EXTRACTION W/PHACO Right 02/04/2019   Procedure: CATARACT EXTRACTION PHACO AND INTRAOCULAR LENS PLACEMENT (IOC);  Surgeon: [name], MD;  Location: [facility];  Service: Ophthalmology;  Laterality: Right;  CDE: 15.19  . EYE SURGERY     KPE left  . MASTECTOMY MODIFIED RADICAL Left 05/28/2018   Procedure: LEFT MODIFIED RADICAL MASTECTOMY;  Surgeon: [name], MD;  Location: [facility];  Service: General;  Laterality: Left;  Marland Kitchen MASTECTOMY, PARTIAL Left 04/24/2017   Procedure: MASTECTOMY PARTIAL;  Surgeon: [name], MD;  Location: [facility];  Service: General;  Laterality: Left;  . PORTACATH PLACEMENT Right 08/08/2018   Procedure: INSERTION PORT-A-CATH;  Surgeon: [name], MD;  Location: [facility];  Service: General;  Laterality: Right;  . TONSILLECTOMY    . TONSILLECTOMY AND ADENOIDECTOMY        Social History:      Social History   Tobacco Use  . Smoking status: Never Smoker  . Smokeless tobacco: Never Used  Substance Use Topics  . Alcohol use: No       Family History :     Family History  Problem Relation Age of Onset  . Heart failure Mother   . Heart failure Father   . Congestive Heart Failure Brother   . Tuberculosis Paternal Uncle   . Tuberculosis Maternal Grandmother   . Tuberculosis Maternal Grandfather   . Congestive Heart Failure Brother      Home Medications:   Prior to Admission medications   Medication Sig Start Date End Date Taking? Authorizing Provider  Ado-Trastuzumab Emtansine (KADCYLA IV) Inject into the vein every 21 ( twenty-one) days.    [provider]  ALPRAZolam Duanne Moron) 0.5 MG tablet Take 1 tablet 30 min before PET 08/05/19   Derek Jack, MD  amLODipine (NORVASC) 5 MG tablet Take 5 mg by mouth daily.    [provider]  aspirin EC 81 MG tablet Take 81 mg by mouth 2 (two) times a day.     [provider]  ferrous sulfate 325 (65 FE) MG tablet Take 325 mg by mouth daily with breakfast.    [provider]  labetalol (NORMODYNE) 100 MG tablet Take 1 tablet (100 mg total) by mouth 2 (two) times daily. Patient taking differently: Take 100 mg by mouth daily.  04/05/16   Sinda Du, MD  lidocaine-prilocaine (EMLA) cream Apply 1 application topically as needed (port access).    [provider]  LORazepam (ATIVAN) 1 MG tablet Take 1 mg by mouth daily.  10/18/18   [provider]  naproxen sodium (ALEVE) 220 MG tablet Take 220 mg by mouth daily as needed (for headache or pain).    [provider]  oxybutynin (DITROPAN) 5 MG tablet Take 5 mg by mouth daily.     [provider]  simvastatin (ZOCOR) 40 MG tablet Take 40 mg by mouth daily.    [provider]  zolpidem (AMBIEN) 5 MG tablet Take 1 tablet (5 mg total) by mouth at bedtime as needed for sleep. 08/20/19   Derek Jack, MD  prochlorperazine (COMPAZINE) 10 MG tablet Take 1 tablet (10 mg total) by mouth every 6 (six) hours as needed (Nausea or vomiting). Patient taking differently: Take 10 mg by mouth daily.  08/07/18 02/28/19  Derek Jack, MD     Allergies:    No Known Allergies   Physical Exam:   Vitals  Blood pressure (!) 163/65, pulse 60, temperature 98.6 F (37 C), temperature source Oral, resp. rate 18, height 5\' 1"  (1.549 m), weight 55.3 kg, SpO2 97 %.  1.  General: Lying supine in bed in no acute distress  2. Psychiatric: Pleasant and cooperative with exam  3. Neurologic: Cranial nerves II through XII are grossly intact no focal deficits on limited exam equal sensation in the lower extremities bilaterally  4. HEENMT:  Head is atraumatic normocephalic, pupils are reactive to light, EOMI, trachea is midline, neck is supple, limited range of motion with rotation of neck to left side-patient reports chronic  5. Respiratory : Lungs are  clear to auscultation bilaterally  6. Cardiovascular : Heart rate is bradycardic rhythm is regular no murmurs  7. Gastrointestinal:  Abdomen is soft nondistended nontender to palpation  8. Skin:  Abrasion on right arm patient reports from cat Bruising on right wrist No bruising over left hip or left shoulder  9.Musculoskeletal:  No peripheral edema    Data Review:    CBC Recent Labs  Lab 09/01/19 1527  WBC 11.9*  HGB 11.9*  HCT 37.2  PLT 195  MCV 91.6  MCH 29.3  MCHC 32.0  RDW 13.8  LYMPHSABS 1.0  MONOABS 1.0  EOSABS 0.0  BASOSABS 0.0   ------------------------------------------------------------------------------------------------------------------  Results for orders placed or performed during the hospital encounter of 09/01/19 (from the past 48 hour(s))  CBC with Differential     Status: Abnormal   Collection Time: 09/01/19  3:27 PM  Result Value Ref Range   WBC 11.9 (H) 4.0 - 10.5 K/uL   RBC 4.06 3.87 - 5.11 MIL/uL   Hemoglobin 11.9 (L) 12.0 - 15.0 g/dL   HCT 37.2 36.0 - 46.0 %   MCV 91.6 80.0 - 100.0 fL   MCH 29.3 26.0 - 34.0 pg   MCHC 32.0 30.0 - 36.0 g/dL   RDW 13.8 11.5 - 15.5 %   Platelets 195 150 - 400 K/uL   nRBC 0.0 0.0 - 0.2 %   Neutrophils Relative % 83 %   Neutro Abs 9.8 (H) 1.7 - 7.7 K/uL   Lymphocytes Relative 8 %   Lymphs Abs 1.0 0.7 - 4.0 K/uL   Monocytes Relative 8 %   Monocytes Absolute 1.0 0.1 - 1.0 K/uL   Eosinophils Relative 0 %   Eosinophils Absolute 0.0 0.0 - 0.5 K/uL   Basophils Relative 0 %   Basophils  Absolute 0.0 0.0 - 0.1 K/uL   Immature Granulocytes 1 %   Abs Immature Granulocytes 0.09 (H) 0.00 - 0.07 K/uL    Comment: Performed at [facility], [address], [place], [place] [address]  Basic metabolic panel     Status: Abnormal   Collection Time: 09/01/19  3:27 PM  Result Value Ref Range   Sodium 134 (L) 135 - 145 mmol/L   Potassium 4.1 3.5 - 5.1 mmol/L   Chloride 101 98 - 111 mmol/L   CO2 21 (L) 22 - 32 mmol/L    Glucose, Bld 152 (H) 70 - 99 mg/dL   BUN 32 (H) 8 - 23 mg/dL   Creatinine, Ser 1.11 (H) 0.44 - 1.00 mg/dL   Calcium 9.1 8.9 - 10.3 mg/dL   GFR calc non Af Amer 43 (L) >60 mL/min   GFR calc Af Amer 50 (L) >60 mL/min   Anion gap 12 5 - 15    Comment: Performed at [facility], [address] [place], [place] [address]    Chemistries  Recent Labs  Lab 09/01/19 1527  NA 134*  K 4.1  CL 101  CO2 21*  GLUCOSE 152*  BUN 32*  CREATININE 1.11*  CALCIUM 9.1   ------------------------------------------------------------------------------------------------------------------  ------------------------------------------------------------------------------------------------------------------ GFR: Estimated Creatinine Clearance: 24.4 mL/min (A) (by C-G formula based on SCr of 1.11 mg/dL (H)). Liver Function Tests: No results for input(s): AST, ALT, ALKPHOS, BILITOT, PROT, ALBUMIN in the last 168 hours. No results for input(s): LIPASE, AMYLASE in the last 168 hours. No results for input(s): AMMONIA in the last 168 hours. Coagulation Profile: No results for input(s): INR, PROTIME in the last 168 hours. Cardiac Enzymes: No results for input(s): CKTOTAL, CKMB, CKMBINDEX, TROPONINI in the last 168 hours. BNP (last 3 results) No results for input(s): PROBNP in the last 8760 hours. HbA1C: No results for input(s): HGBA1C in the last 72 hours. CBG: No results for input(s): GLUCAP in the last 168 hours. Lipid Profile: No results for input(s): CHOL, HDL, LDLCALC, TRIG, CHOLHDL, LDLDIRECT in the last 72 hours. Thyroid Function Tests: No results for input(s): TSH, T4TOTAL, FREET4, T3FREE, THYROIDAB in the last 72 hours. Anemia Panel: No results for input(s): VITAMINB12, FOLATE, FERRITIN, TIBC, IRON, RETICCTPCT in the last 72 hours.  --------------------------------------------------------------------------------------------------------------- Urine analysis:    Component Value Date/Time    COLORURINE YELLOW 11/20/2013 1426   APPEARANCEUR CLEAR 11/20/2013 1426   LABSPEC >1.030 (H) 11/20/2013 1426   PHURINE 5.0 11/20/2013 1426   GLUCOSEU NEGATIVE 11/20/2013 1426   HGBUR NEGATIVE 11/20/2013 1426   BILIRUBINUR SMALL (A) 11/20/2013 1426   KETONESUR 15 (A) 11/20/2013 1426   PROTEINUR NEGATIVE 11/20/2013 1426   UROBILINOGEN 0.2 11/20/2013 1426   NITRITE NEGATIVE 11/20/2013 1426   LEUKOCYTESUR NEGATIVE 11/20/2013 1426      Imaging Results:    DG Lumbar Spine Complete  Result Date: 09/01/2019 CLINICAL DATA:  Fall, left hip pain EXAM: LUMBAR SPINE - COMPLETE 4+ VIEW COMPARISON:  05/24/2019 FINDINGS: Osteopenia. No fracture or dislocation of the lumbar spine. There is moderate to severe multilevel disc degenerative disease and osteophytosis. Shunt catheter tubing projects about the abdomen and pelvis. Moderate burden of stool in the colon. IMPRESSION: Osteopenia. No fracture or dislocation of the lumbar spine. Moderate to severe multilevel disc degenerative disease and osteophytosis. Electronically Signed   By: Eddie Candle M.D.   On: 09/01/2019 14:42   DG Hip Unilat W or Wo Pelvis 2-3 Views Left  Result Date: 09/01/2019 CLINICAL DATA:  Post fall,  now with left hip pain. EXAM: DG HIP (WITH OR WITHOUT PELVIS) 2-3V LEFT COMPARISON:  None. FINDINGS: There is a comminuted intertrochanteric left femur fracture with associated foreshortening and accentuated varus angulation the fracture extends to involve the base of the lesser trochanter which is mildly displaced. No definite intra-articular extension. No additional fractures identified. Expected adjacent soft tissue swelling.  No radiopaque foreign body. Ventriculoperitoneal catheter tubing overlies the midline of the lower abdomen. Multiple phleboliths overlie the midline of lower pelvis. IMPRESSION: Acute, comminuted, displaced left-sided intertrochanteric femur fracture with associated foreshortening and varus angulation. Electronically  Signed   By: Sandi Mariscal M.D.   On: 09/01/2019 14:44    My personal review of EKG: Rhythm sinus bradycardia, Rate 58 /min, QTc 440 ,no Acute ST changes   Assessment & Plan:    Active Problems:   Hip fracture ([place])   1. Acute comminuted displaced left side intratrochanteric femur fracture 1. Fracture shown on x-ray 2. ED provider consulted with Dr. Ihor Gully who requested transfer to [facility] for possible surgery tomorrow 3. Pain scale with Tylenol, oxycodone, morphine 4. UA and Covid test pending for preop clearance 5. EKG without significant ST changes/ signs of ischemia for preop clearance 6. Admit to [facility] 2. Leukocytosis 1. [place] reactive in setting of acute hip fracture 2. No infectious signs 3. Continue to monitor with CBC in the a.m. 3. Shoulder Pain 1. Portable XR shoulder pending 2. Pain scale as above 4. Hypertension 1. Likely secondary to pain 2. Continue home medications 3. Continue to monitor vitals 5. Bradycardia 1. Please secondary to Dilaudid -although could be baseline 2. Monitor on telemetry 6. CKD 1. BUN 32, creatinine 1.11 2. At baseline 3. Avoid nephrotoxic agents when possible 4. Continue to trend with CMP in the a.m.    DVT Prophylaxis-   Heparin- SCDs   AM Labs Ordered, also please review Full Orders  Family Communication: No family at bedside Code Status: Full code  Admission status: Inpatient: Based on patients clinical presentation and evaluation of above clinical data, I have made determination that patient meets Inpatient criteria at this time.  Time spent in minutes : [place]

## 2019-09-01 NOTE — ED Notes (Signed)
Pt assigned room  Room was made ready  Family called and informed   Room has been removed by bed control

## 2019-09-01 NOTE — ED Notes (Signed)
Awaiting room assignment  

## 2019-09-01 NOTE — ED Triage Notes (Signed)
Arrival from home via EMS s/p fall at 0630, complains of left hip pain and left shoulder pain

## 2019-09-01 NOTE — ED Provider Notes (Signed)
[facility] EMERGENCY DEPARTMENT Provider Note   CSN: [id number] Arrival date & time: 09/01/19  1301     History No chief complaint on file.   Carol Duarte is a 86 y.o. female with history of left breast cancer s/p mastectomy currently undergoing chemotherapy, hypertension, hyperlipidemia presents to the ER for ration of left hip and low back pain after a fall around 6:30 AM.  Patient states she slipped off the commode and fell on the ground.  She reports constant, severe left hip pain since the fall and unable to ambulate on it.  Denies any other physical injuries other than having some generalized low back discomfort.  No interventions.  No alleviating factors.  Patient states she has had left hip pain for quite some time.  She went to Dr. [name] for evaluation of this who referred her to he had an injection in the left hip.  She had a left hip injection this past Monday and for the last few days has felt like her hip pain has gotten a lot better and not having to use a walker.  Denies any head injury, headache.  No anticoagulants.  Denies distal loss of sensation or tingling. No history of hip surgery or replacement.   HPI     Past Medical History:  Diagnosis Date  . Cancer ([place])    left breast  . History of kidney stones   . Hypercholesteremia   . Hypertension     Patient Active Problem List   Diagnosis Date Noted  . Hip fracture ([place]) 09/01/2019  . Genetic testing 09/20/2018  . Goals of care, counseling/discussion 08/06/2018  . S/P left mastectomy 05/28/2018  . Malignant neoplasm of lower-outer quadrant of left female breast ([place])   . 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 Unstable angina ([place]) 04/03/2016  . HTN (hypertension) 04/03/2016  . HLD (hyperlipidemia) 04/03/2016  . Bursitis, shoulder 07/10/2014    Past Surgical History:  Procedure Laterality Date  . APPENDECTOMY    . brain cyst removed  2011  . BREAST BIOPSY Left 03/28/2018   Procedure: BREAST BIOPSY;  Surgeon: [name], MD;  Location: [facility];  Service: General;  Laterality: Left;  . CATARACT EXTRACTION W/PHACO Left 11/09/2015   Procedure: CATARACT EXTRACTION PHACO AND INTRAOCULAR LENS PLACEMENT LEFT EYE cde=8.97;  Surgeon: [name], MD;  Location: [facility];  Service: Ophthalmology;  Laterality: Left;  . CATARACT EXTRACTION W/PHACO Right 02/04/2019   Procedure: CATARACT EXTRACTION PHACO AND INTRAOCULAR LENS PLACEMENT (IOC);  Surgeon: [name], MD;  Location: [facility];  Service: Ophthalmology;  Laterality: Right;  CDE: 15.19  . EYE SURGERY     KPE left  . MASTECTOMY MODIFIED RADICAL Left 05/28/2018   Procedure: LEFT MODIFIED RADICAL MASTECTOMY;  Surgeon: [name], MD;  Location: [facility];  Service: General;  Laterality: Left;  Marland Kitchen MASTECTOMY, PARTIAL Left 04/24/2017   Procedure: MASTECTOMY PARTIAL;  Surgeon: [name], MD;  Location: [facility];  Service: General;  Laterality: Left;  . PORTACATH PLACEMENT Right 08/08/2018   Procedure: INSERTION PORT-A-CATH;  Surgeon: [name], MD;  Location: [facility];  Service: General;  Laterality: Right;  . TONSILLECTOMY    . TONSILLECTOMY AND ADENOIDECTOMY       OB History   No obstetric history on file.     Family History  Problem Relation Age of Onset  . Heart failure Mother   . Heart failure Father   . Congestive Heart Failure Brother   . Tuberculosis Paternal  Uncle   . Tuberculosis Maternal Grandmother   . Tuberculosis Maternal Grandfather   . Congestive Heart Failure Brother     Social History   Tobacco Use  . Smoking status: Never Smoker  . Smokeless tobacco: Never Used  Substance Use Topics  . Alcohol use: No  . Drug use: No    Home Medications Prior to Admission medications   Medication Sig Start Date End Date Taking? Authorizing Provider  Ado-Trastuzumab Emtansine (KADCYLA IV) Inject into the vein every 21 ( twenty-one) days.    [provider]  ALPRAZolam Duanne Moron) 0.5 MG tablet  Take 1 tablet 30 min before PET 08/05/19   Derek Jack, MD  amLODipine (NORVASC) 5 MG tablet Take 5 mg by mouth daily.    [provider]  aspirin EC 81 MG tablet Take 81 mg by mouth 2 (two) times a day.    [provider]  ferrous sulfate 325 (65 FE) MG tablet Take 325 mg by mouth daily with breakfast.    [provider]  labetalol (NORMODYNE) 100 MG tablet Take 1 tablet (100 mg total) by mouth 2 (two) times daily. Patient taking differently: Take 100 mg by mouth daily.  04/05/16   Sinda Du, MD  lidocaine-prilocaine (EMLA) cream Apply 1 application topically as needed (port access).    [provider]  LORazepam (ATIVAN) 1 MG tablet Take 1 mg by mouth daily.  10/18/18   [provider]  naproxen sodium (ALEVE) 220 MG tablet Take 220 mg by mouth daily as needed (for headache or pain).    [provider]  oxybutynin (DITROPAN) 5 MG tablet Take 5 mg by mouth daily.     [provider]  simvastatin (ZOCOR) 40 MG tablet Take 40 mg by mouth daily.    [provider]  zolpidem (AMBIEN) 5 MG tablet Take 1 tablet (5 mg total) by mouth at bedtime as needed for sleep. 08/20/19   Derek Jack, MD  prochlorperazine (COMPAZINE) 10 MG tablet Take 1 tablet (10 mg total) by mouth every 6 (six) hours as needed (Nausea or vomiting). Patient taking differently: Take 10 mg by mouth daily.  08/07/18 02/28/19  Derek Jack, MD    Allergies    Patient has no known allergies.  Review of Systems   Review of Systems  Musculoskeletal: Positive for arthralgias, back pain and gait problem.  All other systems reviewed and are negative.   Physical Exam Updated Vital Signs BP (!) 160/80   Pulse 63   Temp 98.6 F (37 C) (Oral)   Resp 16   Ht 5\' 1"  (1.549 m)   Wt 55.3 kg   SpO2 97%   BMI 23.05 kg/m   Physical Exam Vitals and nursing note reviewed.  Constitutional:      General: She is not in acute distress.     Appearance: She is well-developed.     Comments: NAD.  HENT:     Head: Normocephalic and atraumatic.     Comments: No facial, head trauma.    Right Ear: External ear normal.     Left Ear: External ear normal.     Nose: Nose normal.  Eyes:     General: No scleral icterus.    Conjunctiva/sclera: Conjunctivae normal.  Neck:     Comments: C-spine: No cervical collar in place.  No midline or paraspinal muscle tenderness.  Full range of motion of the neck without any pain. Cardiovascular:     Rate and Rhythm: Normal rate and  regular rhythm.     Heart sounds: Normal heart sounds.     Comments: 1+ DP pulses bilaterally. LE warm  Pulmonary:     Effort: Pulmonary effort is normal.     Breath sounds: Normal breath sounds.     Comments: S/p left mastectomy. No APL chest [place] tenderness.  Musculoskeletal:        General: Tenderness and deformity present. Normal range of motion.     Cervical back: Normal range of motion and neck supple.     Comments: Diffuse left low midline and left-sided muscular tenderness.  Tenderness over left inguinal crease, lateral hip.  Left leg is externally rotated, flexed at the hip and shortened.  Unable to perform range of motion of the left hip due to pain and concern for fracture.  Left knee is nontender, ROM deferred due to hip pain.  Left ankle and foot normal with normal range of motion and no signs of trauma.  Skin:    General: Skin is warm and dry.     Capillary Refill: Capillary refill takes less than 2 seconds.  Neurological:     Mental Status: She is alert and oriented to person, place, and time.     Comments: Patient able to wiggle left toes and plantarflex/dorsiflex left ankle without difficulty or pain.  Sensation intact in left lower extremity.  Psychiatric:        Behavior: Behavior normal.        Thought Content: Thought content normal.        Judgment: Judgment normal.     ED Results / Procedures / Treatments   Labs (all labs ordered are  listed, but only abnormal results are displayed) Labs Reviewed  CBC WITH DIFFERENTIAL/PLATELET - Abnormal; Notable for the following components:      Result Value   WBC 11.9 (*)    Hemoglobin 11.9 (*)    Neutro Abs 9.8 (*)    Abs Immature Granulocytes 0.09 (*)    All other components within normal limits  BASIC METABOLIC PANEL - Abnormal; Notable for the following components:   Sodium 134 (*)    CO2 21 (*)    Glucose, Bld 152 (*)    BUN 32 (*)    Creatinine, Ser 1.11 (*)    GFR calc non Af Amer 43 (*)    GFR calc Af Amer 50 (*)    All other components within normal limits  SARS CORONAVIRUS 2 (TAT 6-24 HRS)  URINALYSIS, ROUTINE W REFLEX MICROSCOPIC    EKG None  Radiology DG Lumbar Spine Complete  Result Date: 09/01/2019 CLINICAL DATA:  Fall, left hip pain EXAM: LUMBAR SPINE - COMPLETE 4+ VIEW COMPARISON:  05/24/2019 FINDINGS: Osteopenia. No fracture or dislocation of the lumbar spine. There is moderate to severe multilevel disc degenerative disease and osteophytosis. Shunt catheter tubing projects about the abdomen and pelvis. Moderate burden of stool in the colon. IMPRESSION: Osteopenia. No fracture or dislocation of the lumbar spine. Moderate to severe multilevel disc degenerative disease and osteophytosis. Electronically Signed   By: Eddie Candle M.D.   On: 09/01/2019 14:42   DG Hip Unilat W or Wo Pelvis 2-3 Views Left  Result Date: 09/01/2019 CLINICAL DATA:  Post fall, now with left hip pain. EXAM: DG HIP (WITH OR WITHOUT PELVIS) 2-3V LEFT COMPARISON:  None. FINDINGS: There is a comminuted intertrochanteric left femur fracture with associated foreshortening and accentuated varus angulation the fracture extends to involve the base of the lesser trochanter which is mildly displaced.  No definite intra-articular extension. No additional fractures identified. Expected adjacent soft tissue swelling.  No radiopaque foreign body. Ventriculoperitoneal catheter tubing overlies the midline of  the lower abdomen. Multiple phleboliths overlie the midline of lower pelvis. IMPRESSION: Acute, comminuted, displaced left-sided intertrochanteric femur fracture with associated foreshortening and varus angulation. Electronically Signed   By: Sandi Mariscal M.D.   On: 09/01/2019 14:44    Procedures Procedures (including critical care time)  Medications Ordered in ED Medications  HYDROmorphone (DILAUDID) injection 1 mg (1 mg Intravenous Given 09/01/19 1525)    ED Course  I have reviewed the triage vital signs and the nursing notes.  Pertinent labs & imaging results that were available during my care of the patient were reviewed by me and considered in my medical decision making (see chart for details).  Clinical Course as of Aug 31 1734  Sun Sep 01, 2019  1449 Acute, comminuted, displaced left-sided intertrochanteric femur fracture with associated foreshortening and varus angulation.  DG Hip Unilat W or Wo Pelvis 2-3 Views Left [CG]  6256 IMPRESSION: Osteopenia. No fracture or dislocation of the lumbar spine. Moderate to severe multilevel disc degenerative disease and osteophytosis.  DG Lumbar Spine Complete [CG]    Clinical Course User Index [CG] Kinnie Feil, PA-C   MDM Rules/Calculators/A&P                      Xray confirms left displaced intratrochanteric femur fracture. Lumbar x-ray non acute.    Spoke to Dr Alvan Dame who recommends transfer to [facility] for likely OR tomorrow morning.   Screening labs and COVID ordered.   Patient updated.  Dilaudid for pain with good control.   Pending medicine admission. Discussed with EDP.  Final Clinical Impression(s) / ED Diagnoses Final diagnoses:  Fall, initial encounter  Traumatic closed trochanteric fracture of femur with minimal displacement, left, initial encounter [facility])    Rx / DC Orders ED Discharge Orders    None       Kinnie Feil, PA-C 09/01/19 1736    Long, Wonda Olds, MD 09/01/19 [phone]

## 2019-09-02 ENCOUNTER — Encounter (HOSPITAL_COMMUNITY): Payer: Self-pay | Admitting: Family Medicine

## 2019-09-02 ENCOUNTER — Other Ambulatory Visit: Payer: Self-pay

## 2019-09-02 LAB — COMPREHENSIVE METABOLIC PANEL
ALT: 32 U/L (ref 0–44)
AST: 37 U/L (ref 15–41)
Albumin: 3.9 g/dL (ref 3.5–5.0)
Alkaline Phosphatase: 61 U/L (ref 38–126)
Anion gap: 12 (ref 5–15)
BUN: 36 mg/dL — ABNORMAL HIGH (ref 8–23)
CO2: 23 mmol/L (ref 22–32)
Calcium: 9.3 mg/dL (ref 8.9–10.3)
Chloride: 102 mmol/L (ref 98–111)
Creatinine, Ser: 1.21 mg/dL — ABNORMAL HIGH (ref 0.44–1.00)
GFR calc Af Amer: 45 mL/min — ABNORMAL LOW (ref >60)
GFR calc non Af Amer: 39 mL/min — ABNORMAL LOW (ref >60)
Glucose, Bld: 154 mg/dL — ABNORMAL HIGH (ref 70–99)
Potassium: 4.8 mmol/L (ref 3.5–5.1)
Sodium: 137 mmol/L (ref 135–145)
Total Bilirubin: 0.7 mg/dL (ref 0.3–1.2)
Total Protein: 6.8 g/dL (ref 6.5–8.1)

## 2019-09-02 LAB — CBC WITH DIFFERENTIAL/PLATELET
Abs Immature Granulocytes: 0.07 10*3/uL (ref 0.00–0.07)
Basophils Absolute: 0 10*3/uL (ref 0.0–0.1)
Basophils Relative: 0 %
Eosinophils Absolute: 0 10*3/uL (ref 0.0–0.5)
Eosinophils Relative: 0 %
HCT: 36.9 % (ref 36.0–46.0)
Hemoglobin: 11.7 g/dL — ABNORMAL LOW (ref 12.0–15.0)
Immature Granulocytes: 1 %
Lymphocytes Relative: 10 %
Lymphs Abs: 1.1 10*3/uL (ref 0.7–4.0)
MCH: 29.4 pg (ref 26.0–34.0)
MCHC: 31.7 g/dL (ref 30.0–36.0)
MCV: 92.7 fL (ref 80.0–100.0)
Monocytes Absolute: 1 10*3/uL (ref 0.1–1.0)
Monocytes Relative: 9 %
Neutro Abs: 8.5 10*3/uL — ABNORMAL HIGH (ref 1.7–7.7)
Neutrophils Relative %: 80 %
Platelets: 190 10*3/uL (ref 150–400)
RBC: 3.98 MIL/uL (ref 3.87–5.11)
RDW: 14.2 % (ref 11.5–15.5)
WBC: 10.6 10*3/uL — ABNORMAL HIGH (ref 4.0–10.5)
nRBC: 0 % (ref 0.0–0.2)

## 2019-09-02 LAB — SARS CORONAVIRUS 2 (TAT 6-24 HRS): SARS Coronavirus 2: NEGATIVE

## 2019-09-02 LAB — GLUCOSE, CAPILLARY
Glucose-Capillary: 142 mg/dL — ABNORMAL HIGH (ref 70–99)
Glucose-Capillary: 126 mg/dL — ABNORMAL HIGH (ref 70–99)

## 2019-09-02 LAB — MAGNESIUM: Magnesium: 2.6 mg/dL — ABNORMAL HIGH (ref 1.7–2.4)

## 2019-09-02 MED ORDER — AMLODIPINE BESYLATE 10 MG PO TABS
10.0000 mg | ORAL_TABLET | Freq: Every day | ORAL | Status: DC
  Administered 2019-09-02 – 2019-09-07 (×4): 10 mg via ORAL
  Filled 2019-09-02 (×6): qty 1

## 2019-09-02 MED ORDER — ATORVASTATIN CALCIUM 20 MG PO TABS
20.0000 mg | ORAL_TABLET | Freq: Every day | ORAL | Status: DC
  Administered 2019-09-02 – 2019-09-06 (×5): 20 mg via ORAL
  Filled 2019-09-02 (×5): qty 1

## 2019-09-02 MED ORDER — TRANEXAMIC ACID-NACL 1000-0.7 MG/100ML-% IV SOLN: 1000.0000 mg | INTRAVENOUS | Status: DC

## 2019-09-02 MED ORDER — POLYETHYLENE GLYCOL 3350 17 G PO PACK
17.0000 g | PACK | Freq: Every day | ORAL | Status: DC | PRN
  Administered 2019-09-02: 17 g via ORAL
  Filled 2019-09-02: qty 1

## 2019-09-02 MED ORDER — SODIUM CHLORIDE 0.9 % IV SOLN: INTRAVENOUS | Status: DC

## 2019-09-02 MED ORDER — HYDRALAZINE HCL 20 MG/ML IJ SOLN: 10.0000 mg | Freq: Four times a day (QID) | INTRAMUSCULAR | Status: DC | PRN

## 2019-09-02 MED ORDER — POVIDONE-IODINE 10 % EX SWAB
2.0000 "application " | Freq: Once | CUTANEOUS | Status: AC
  Administered 2019-09-03: 2 via TOPICAL

## 2019-09-02 MED ORDER — ZOLPIDEM TARTRATE 5 MG PO TABS
5.0000 mg | ORAL_TABLET | Freq: Every evening | ORAL | Status: DC | PRN
  Administered 2019-09-06: 5 mg via ORAL
  Filled 2019-09-02: qty 1

## 2019-09-02 MED ORDER — ENSURE PRE-SURGERY PO LIQD
296.0000 mL | Freq: Once | ORAL | Status: AC
  Administered 2019-09-02: 296 mL via ORAL
  Filled 2019-09-02: qty 296

## 2019-09-02 MED ORDER — CEFAZOLIN SODIUM-DEXTROSE 2-4 GM/100ML-% IV SOLN
2.0000 g | INTRAVENOUS | Status: AC
  Administered 2019-09-03: 13:00:00 2 g via INTRAVENOUS
  Filled 2019-09-02 (×2): qty 100

## 2019-09-02 MED ORDER — OXYBUTYNIN CHLORIDE 5 MG PO TABS
5.0000 mg | ORAL_TABLET | Freq: Every day | ORAL | Status: DC
  Administered 2019-09-02 – 2019-09-07 (×5): 5 mg via ORAL
  Filled 2019-09-02 (×5): qty 1

## 2019-09-02 MED ORDER — LORAZEPAM 1 MG PO TABS
1.0000 mg | ORAL_TABLET | Freq: Every day | ORAL | Status: DC
  Administered 2019-09-02 – 2019-09-07 (×4): 1 mg via ORAL
  Filled 2019-09-02 (×4): qty 1

## 2019-09-02 MED ORDER — CHLORHEXIDINE GLUCONATE 4 % EX LIQD
60.0000 mL | Freq: Once | CUTANEOUS | Status: AC
  Administered 2019-09-03: 4 via TOPICAL
  Filled 2019-09-02: qty 15
  Filled 2019-09-02: qty 60

## 2019-09-02 MED ORDER — SODIUM CHLORIDE 0.9% FLUSH
10.0000 mL | INTRAVENOUS | Status: DC | PRN
  Administered 2019-09-06 – 2019-09-07 (×2): 10 mL

## 2019-09-02 MED ORDER — CHLORHEXIDINE GLUCONATE CLOTH 2 % EX PADS
6.0000 | MEDICATED_PAD | Freq: Every day | CUTANEOUS | Status: DC
  Administered 2019-09-02 – 2019-09-06 (×5): 6 via TOPICAL

## 2019-09-02 MED ORDER — HEPARIN SODIUM (PORCINE) 5000 UNIT/ML IJ SOLN
5000.0000 [IU] | Freq: Three times a day (TID) | INTRAMUSCULAR | Status: DC
  Administered 2019-09-02 – 2019-09-03 (×3): 5000 [IU] via SUBCUTANEOUS
  Filled 2019-09-02 (×3): qty 1

## 2019-09-02 MED ORDER — FERROUS SULFATE 325 (65 FE) MG PO TABS
325.0000 mg | ORAL_TABLET | Freq: Every day | ORAL | Status: DC
  Administered 2019-09-02 – 2019-09-07 (×5): 325 mg via ORAL
  Filled 2019-09-02 (×5): qty 1

## 2019-09-02 MED ORDER — AMLODIPINE BESYLATE 5 MG PO TABS: 5.0000 mg | ORAL_TABLET | Freq: Every day | ORAL | Status: DC

## 2019-09-02 MED ORDER — MORPHINE SULFATE (PF) 2 MG/ML IV SOLN
2.0000 mg | Freq: Once | INTRAVENOUS | Status: AC
  Administered 2019-09-02: 2 mg via INTRAVENOUS
  Filled 2019-09-02: qty 1

## 2019-09-02 MED ORDER — LABETALOL HCL 100 MG PO TABS
100.0000 mg | ORAL_TABLET | Freq: Every day | ORAL | Status: DC
  Administered 2019-09-02 – 2019-09-07 (×4): 100 mg via ORAL
  Filled 2019-09-02 (×5): qty 1

## 2019-09-02 MED ORDER — SIMVASTATIN 20 MG PO TABS: 40.0000 mg | ORAL_TABLET | Freq: Every day | ORAL | Status: DC

## 2019-09-02 NOTE — Progress Notes (Signed)
PROGRESS NOTE    Carol Duarte  VPX:[id number] DOB: [birth year] DOA: 09/01/2019 PCP: [name], MD   Brief Narrative:  Patient is a 86 year old female with history of hypertension, hyperlipidemia, breast cancer who presents to the emergency department with complaint of left hip pain after falling from a bathroom commode.  X-ray imaging done in the emergency department showed acute, comminuted, displaced left-sided intertrochanteric fracture.  Orthopedics consulted.  Planning for ORIF tomorrow.  Assessment & Plan:   Active Problems:   Hip fracture (HCC)   Left intertrochanteric femur fracture: X-ray as above.  Orthopedics following.  Planning for surgery tomorrow.  Continue pain management, supportive care.  Heparin [place] for DVT prophylaxis for now.  PT/OT will be consulted after surgery.  Pain well controlled. Patient  is usually physically active, ambulates with the help of walker.  History of left breast cancer: On trastuzumab.  Follows with her oncologist at [facility].  Status post mastectomy.  Leukocytosis: Most likely reactive.  Mild  Hypertension: Currently blood pressure well controlled.  Continue home medicines  CKD stage II: Currently kidney function at baseline.         DVT prophylaxis:Heparin [place] Code Status: Full Family Communication: None present at the bedside Disposition Plan: Most likely skilled nursing facility.   Consultants: Orthopedics  Procedures: None  Antimicrobials:  Anti-infectives (From admission, onward)   None      Subjective: Patient seen and examined the bedside this morning.  Hemodynamically stable.  Looks comfortable.  Pain well controlled.  Objective: Vitals:   09/01/19 2330 09/02/19 0000 09/02/19 0240 09/02/19 0548  BP: (!) 148/77 (!) 155/68 (!) 178/68 (!) 159/79  Pulse: 62 60 63 65  Resp: 20 17 16 16   Temp:   98.2 F (36.8 C) (!) 97.4 F (36.3 C)  TempSrc:   Oral Oral  SpO2: 98% 100% 100% 98%  Weight:      Height:         Intake/Output Summary (Last 24 hours) at 09/02/2019 1307 Last data filed at 09/02/2019 0604 Gross per 24 hour  Intake 33.14 ml  Output 0 ml  Net 33.14 ml   Filed Weights   09/01/19 1321  Weight: 55.3 kg    Examination:  General exam: Pleasant elderly female HEENT:PERRL,Oral mucosa moist, Ear/Nose normal on gross exam Respiratory system: Bilateral equal air entry, normal vesicular breath sounds, no wheezes or crackles  Cardiovascular system: S1 & S2 heard, RRR. No JVD, murmurs, rubs, gallops or clicks. No pedal edema.  Chemo-Port on the right chest Gastrointestinal system: Abdomen is nondistended, soft and nontender. No organomegaly or masses felt. Normal bowel sounds heard. Central nervous system: Alert and oriented. No focal neurological deficits. Extremities: No edema, no clubbing ,no cyanosis, tenderness on the left hip, decreased range of motion on the left lower extremity  skin: No rashes, lesions or ulcers,no icterus ,no pallor      Data Reviewed: I have personally reviewed following labs and imaging studies  CBC: Recent Labs  Lab 09/01/19 1527 09/02/19 0322  WBC 11.9* 10.6*  NEUTROABS 9.8* 8.5*  HGB 11.9* 11.7*  HCT 37.2 36.9  MCV 91.6 92.7  PLT 195 462   Basic Metabolic Panel: Recent Labs  Lab 09/01/19 1527 09/02/19 0322  NA 134* 137  K 4.1 4.8  CL 101 102  CO2 21* 23  GLUCOSE 152* 154*  BUN 32* 36*  CREATININE 1.11* 1.21*  CALCIUM 9.1 9.3  MG  --  2.6*   GFR: Estimated Creatinine Clearance: 22.4 mL/min (A) (  by C-G formula based on SCr of 1.21 mg/dL (H)). Liver Function Tests: Recent Labs  Lab 09/02/19 0322  AST 37  ALT 32  ALKPHOS 61  BILITOT 0.7  PROT 6.8  ALBUMIN 3.9   No results for input(s): LIPASE, AMYLASE in the last 168 hours. No results for input(s): AMMONIA in the last 168 hours. Coagulation Profile: No results for input(s): INR, PROTIME in the last 168 hours. Cardiac Enzymes: No results for input(s): CKTOTAL, CKMB,  CKMBINDEX, TROPONINI in the last 168 hours. BNP (last 3 results) No results for input(s): PROBNP in the last 8760 hours. HbA1C: No results for input(s): HGBA1C in the last 72 hours. CBG: Recent Labs  Lab 09/01/19 2200 09/02/19 0544 09/02/19 0759  GLUCAP 184* 142* 126*   Lipid Profile: No results for input(s): CHOL, HDL, LDLCALC, TRIG, CHOLHDL, LDLDIRECT in the last 72 hours. Thyroid Function Tests: No results for input(s): TSH, T4TOTAL, FREET4, T3FREE, THYROIDAB in the last 72 hours. Anemia Panel: No results for input(s): VITAMINB12, FOLATE, FERRITIN, TIBC, IRON, RETICCTPCT in the last 72 hours. Sepsis Labs: No results for input(s): PROCALCITON, LATICACIDVEN in the last 168 hours.  Recent Results (from the past 240 hour(s))  SARS CORONAVIRUS 2 (TAT 6-24 HRS) Nasopharyngeal Nasopharyngeal Swab     Status: None   Collection Time: 09/01/19  4:18 PM   Specimen: Nasopharyngeal Swab  Result Value Ref Range Status   SARS Coronavirus 2 NEGATIVE NEGATIVE Final    Comment: (NOTE) SARS-CoV-2 target nucleic acids are NOT DETECTED. The SARS-CoV-2 RNA is generally detectable in upper and lower respiratory specimens during the acute phase of infection. Negative results do not preclude SARS-CoV-2 infection, do not rule out co-infections with other pathogens, and should not be used as the sole basis for treatment or other patient management decisions. Negative results must be combined with clinical observations, patient history, and epidemiological information. The expected result is Negative. Fact Sheet for Patients: SugarRoll.be Fact Sheet for Healthcare Providers: https://www.woods-mathews.com/ This test is not yet approved or cleared by the [place] FDA and  has been authorized for detection and/or diagnosis of SARS-CoV-2 by FDA under an Emergency Use Authorization (EUA). This EUA will remain  in effect (meaning this test can be used) for  the duration of the COVID-19 declaration under Section 56 4(b)(1) of the Act, 21 U.S.C. section 360bbb-3(b)(1), unless the authorization is terminated or revoked sooner. Performed at [facility], [place] [address] [place], [place] [address]   Respiratory Panel by RT PCR (Flu A&B, Covid) - Nasopharyngeal Swab     Status: None   Collection Time: 09/01/19  9:21 PM   Specimen: Nasopharyngeal Swab  Result Value Ref Range Status   SARS Coronavirus 2 by RT PCR NEGATIVE NEGATIVE Final    Comment: (NOTE) SARS-CoV-2 target nucleic acids are NOT DETECTED. The SARS-CoV-2 RNA is generally detectable in upper respiratoy specimens during the acute phase of infection. The lowest concentration of SARS-CoV-2 viral copies this assay can detect is 131 copies/mL. A negative result does not preclude SARS-Cov-2 infection and should not be used as the sole basis for treatment or other patient management decisions. A negative result may occur with  improper specimen collection/handling, submission of specimen other than nasopharyngeal swab, presence of viral mutation(s) within the areas targeted by this assay, and inadequate number of viral copies (<131 copies/mL). A negative result must be combined with clinical observations, patient history, and epidemiological information. The expected result is Negative. Fact Sheet for Patients:  PinkCheek.be Fact Sheet  for Healthcare Providers:  GravelBags.it This test is not yet ap proved or cleared by the [place] and  has been authorized for detection and/or diagnosis of SARS-CoV-2 by FDA under an Emergency Use Authorization (EUA). This EUA will remain  in effect (meaning this test can be used) for the duration of the COVID-19 declaration under Section 564(b)(1) of the Act, 21 U.S.C. section 360bbb-3(b)(1), unless the authorization is terminated or revoked sooner.    Influenza A by PCR  NEGATIVE NEGATIVE Final   Influenza B by PCR NEGATIVE NEGATIVE Final    Comment: (NOTE) The Xpert Xpress SARS-CoV-2/FLU/RSV assay is intended as an aid in  the diagnosis of influenza from Nasopharyngeal swab specimens and  should not be used as a sole basis for treatment. Nasal washings and  aspirates are unacceptable for Xpert Xpress SARS-CoV-2/FLU/RSV  testing. Fact Sheet for Patients: PinkCheek.be Fact Sheet for Healthcare Providers: GravelBags.it This test is not yet approved or cleared by the [place] FDA and  has been authorized for detection and/or diagnosis of SARS-CoV-2 by  FDA under an Emergency Use Authorization (EUA). This EUA will remain  in effect (meaning this test can be used) for the duration of the  Covid-19 declaration under Section 564(b)(1) of the Act, 21  U.S.C. section 360bbb-3(b)(1), unless the authorization is  terminated or revoked. Performed at [facility], [address] [place], [place] [address]          Radiology Studies: DG Lumbar Spine Complete  Result Date: 09/01/2019 CLINICAL DATA:  Fall, left hip pain EXAM: LUMBAR SPINE - COMPLETE 4+ VIEW COMPARISON:  05/24/2019 FINDINGS: Osteopenia. No fracture or dislocation of the lumbar spine. There is moderate to severe multilevel disc degenerative disease and osteophytosis. Shunt catheter tubing projects about the abdomen and pelvis. Moderate burden of stool in the colon. IMPRESSION: Osteopenia. No fracture or dislocation of the lumbar spine. Moderate to severe multilevel disc degenerative disease and osteophytosis. Electronically Signed   By: Eddie Candle M.D.   On: 09/01/2019 14:42   DG Shoulder Left Port  Result Date: 09/01/2019 CLINICAL DATA:  Left shoulder pain after fall EXAM: LEFT SHOULDER COMPARISON:  None. FINDINGS: No acute fracture or dislocation. Mild degenerative changes of the glenohumeral and acromioclavicular joints. No focal soft  tissue swelling. There are surgical clips in the left axillary region. IMPRESSION: No acute fracture or dislocation of the left shoulder. Electronically Signed   By: Davina Poke D.O.   On: 09/01/2019 17:52   DG Hip Unilat W or Wo Pelvis 2-3 Views Left  Result Date: 09/01/2019 CLINICAL DATA:  Post fall, now with left hip pain. EXAM: DG HIP (WITH OR WITHOUT PELVIS) 2-3V LEFT COMPARISON:  None. FINDINGS: There is a comminuted intertrochanteric left femur fracture with associated foreshortening and accentuated varus angulation the fracture extends to involve the base of the lesser trochanter which is mildly displaced. No definite intra-articular extension. No additional fractures identified. Expected adjacent soft tissue swelling.  No radiopaque foreign body. Ventriculoperitoneal catheter tubing overlies the midline of the lower abdomen. Multiple phleboliths overlie the midline of lower pelvis. IMPRESSION: Acute, comminuted, displaced left-sided intertrochanteric femur fracture with associated foreshortening and varus angulation. Electronically Signed   By: Sandi Mariscal M.D.   On: 09/01/2019 14:44        Scheduled Meds: . amLODipine  10 mg Oral Daily  . atorvastatin  20 mg Oral q1800  . Chlorhexidine Gluconate Cloth  6 each Topical Daily  . ferrous sulfate  325 mg Oral  Q breakfast  . heparin  5,000 Units Subcutaneous Q8H  . labetalol  100 mg Oral Daily  . LORazepam  1 mg Oral Daily  . oxybutynin  5 mg Oral Daily   Continuous Infusions: . sodium chloride 50 mL/hr at 09/02/19 0400     LOS: 1 day    Time spent: 25 mins.More than 50% of that time was spent in counseling and/or coordination of care.      Shelly Coss, MD [facility] Pager [phone]  If 7PM-7AM, please contact night-coverage [email or web address] Password [email or web address] 09/02/2019, 1:07 PM

## 2019-09-02 NOTE — ED Notes (Signed)
Report given to [employer] on [place] at [facility]

## 2019-09-02 NOTE — Consult Note (Signed)
ORTHOPAEDIC CONSULTATION  REQUESTING PHYSICIAN: Shelly Coss, MD  PCP:  [name], MD  Chief Complaint: Left hip pain  HPI: Carol Duarte is a 86 y.o. female who is seen in consult for left hip intertrochanteric femur fracture secondary to a fall at home on Sunday, 09/01/19. Patient reports she had been having left hip and thigh pain as well as difficulty bearing weight on the left leg for a few weeks prior to the fall, without other known injury. This caused her to require use of walker for ambulation. She was evaluated by her PCP, who sent her for x-ray and MRI at [facility] due to remote history of cranial shunt. Imaging revealed multilevel degenerative changes of the lumbar spine. She reports she then received a left hip IA injection, although chart review indicates she had an L5 nerve root block on 08/26/19. She reports the injection provided 2 days of complete relief, after which her pain returned. She states she was at home yesterday, on the commode, when she slid off and landed on her left buttock. She noticed her foot was externally rotated, and she was unable to stand up. She denies LOC or other injury. She called for her daughter, who called EMS. She was transported to [facility] ED. Dr. Alvan Dame was consulted, and requested transfer to [facility] for surgery on Tuesday, 1/5.   Today, she reports she is comfortable at rest. She has pain with any motion of the hip. She states she is very active for her age, and regularly cooks, cleans, and gardens without the use of any assistive devices. She lives at home with her daughter and granddaughter, and plenty of other family nearby. She was diagnosed with breast cancer in 2018, and underwent left sided mastectomy in 2019. She is actively treated with chemotherapy. She has a PET scan every 2 months, and her most recent scan showed no evidence of osseous metastasis. She is on Aspirin 81 mg BID at home.   Past Medical History:  Diagnosis Date   . Cancer ([place])    left breast  . History of kidney stones   . Hypercholesteremia   . Hypertension    Past Surgical History:  Procedure Laterality Date  . APPENDECTOMY    . brain cyst removed  2011  . BREAST BIOPSY Left 03/28/2018   Procedure: BREAST BIOPSY;  Surgeon: [name], MD;  Location: [facility];  Service: General;  Laterality: Left;  . CATARACT EXTRACTION W/PHACO Left 11/09/2015   Procedure: CATARACT EXTRACTION PHACO AND INTRAOCULAR LENS PLACEMENT LEFT EYE cde=8.97;  Surgeon: [name], MD;  Location: [facility];  Service: Ophthalmology;  Laterality: Left;  . CATARACT EXTRACTION W/PHACO Right 02/04/2019   Procedure: CATARACT EXTRACTION PHACO AND INTRAOCULAR LENS PLACEMENT (IOC);  Surgeon: [name], MD;  Location: [facility];  Service: Ophthalmology;  Laterality: Right;  CDE: 15.19  . EYE SURGERY     KPE left  . MASTECTOMY MODIFIED RADICAL Left 05/28/2018   Procedure: LEFT MODIFIED RADICAL MASTECTOMY;  Surgeon: [name], MD;  Location: [facility];  Service: General;  Laterality: Left;  Marland Kitchen MASTECTOMY, PARTIAL Left 04/24/2017   Procedure: MASTECTOMY PARTIAL;  Surgeon: [name], MD;  Location: [facility];  Service: General;  Laterality: Left;  . PORTACATH PLACEMENT Right 08/08/2018   Procedure: INSERTION PORT-A-CATH;  Surgeon: [name], MD;  Location: [facility];  Service: General;  Laterality: Right;  . TONSILLECTOMY    . TONSILLECTOMY AND ADENOIDECTOMY     Social History   Socioeconomic  History  . Marital status: Widowed    Spouse name: Not on file  . Number of children: Not on file  . Years of education: Not on file  . Highest education level: Not on file  Occupational History  . Not on file  Tobacco Use  . Smoking status: Never Smoker  . Smokeless tobacco: Never Used  Substance and Sexual Activity  . Alcohol use: No  . Drug use: No  . Sexual activity: Not Currently    Partners: Male  Other Topics Concern  . Not on file  Social History Narrative  . Not on file    Social Determinants of Health   Financial Resource Strain:   . Difficulty of Paying Living Expenses: Not on file  Food Insecurity:   . Worried About Charity fundraiser in the Last Year: Not on file  . Ran Out of Food in the Last Year: Not on file  Transportation Needs:   . Lack of Transportation (Medical): Not on file  . Lack of Transportation (Non-Medical): Not on file  Physical Activity:   . Days of Exercise per Week: Not on file  . Minutes of Exercise per Session: Not on file  Stress:   . Feeling of Stress : Not on file  Social Connections:   . Frequency of Communication with Friends and Family: Not on file  . Frequency of Social Gatherings with Friends and Family: Not on file  . Attends Religious Services: Not on file  . Active Member of Clubs or Organizations: Not on file  . Attends Archivist Meetings: Not on file  . Marital Status: Not on file   Family History  Problem Relation Age of Onset  . Heart failure Mother   . Heart failure Father   . Congestive Heart Failure Brother   . Tuberculosis Paternal Uncle   . Tuberculosis Maternal Grandmother   . Tuberculosis Maternal Grandfather   . Congestive Heart Failure Brother    No Known Allergies Prior to Admission medications   Medication Sig Start Date End Date Taking? Authorizing Provider  Ado-Trastuzumab Emtansine (KADCYLA IV) Inject into the vein every 21 ( twenty-one) days.   Yes [provider]  amLODipine (NORVASC) 5 MG tablet Take 5 mg by mouth daily.   Yes [provider]  aspirin EC 81 MG tablet Take 81 mg by mouth 2 (two) times a day.   Yes [provider]  ferrous sulfate 325 (65 FE) MG tablet Take 325 mg by mouth daily with breakfast.   Yes [provider]  labetalol (NORMODYNE) 100 MG tablet Take 1 tablet (100 mg total) by mouth 2 (two) times daily. 04/05/16  Yes Sinda Du, MD  lidocaine-prilocaine (EMLA) cream Apply 1 application topically as needed (port  access).   Yes [provider]  naproxen sodium (ALEVE) 220 MG tablet Take 220 mg by mouth daily as needed (for headache or pain).   Yes [provider]  oxybutynin (DITROPAN) 5 MG tablet Take 5 mg by mouth daily.    Yes [provider]  simvastatin (ZOCOR) 40 MG tablet Take 40 mg by mouth daily.   Yes [provider]  zolpidem (AMBIEN) 5 MG tablet Take 1 tablet (5 mg total) by mouth at bedtime as needed for sleep. 08/20/19  Yes Derek Jack, MD  ALPRAZolam Duanne Moron) 0.5 MG tablet Take 1 tablet 30 min before PET Patient not taking: Reported on 09/02/2019 08/05/19   Derek Jack, MD  prochlorperazine (COMPAZINE) 10 MG  tablet Take 1 tablet (10 mg total) by mouth every 6 (six) hours as needed (Nausea or vomiting). Patient taking differently: Take 10 mg by mouth daily.  08/07/18 02/28/19  Derek Jack, MD   DG Lumbar Spine Complete  Result Date: 09/01/2019 CLINICAL DATA:  Fall, left hip pain EXAM: LUMBAR SPINE - COMPLETE 4+ VIEW COMPARISON:  05/24/2019 FINDINGS: Osteopenia. No fracture or dislocation of the lumbar spine. There is moderate to severe multilevel disc degenerative disease and osteophytosis. Shunt catheter tubing projects about the abdomen and pelvis. Moderate burden of stool in the colon. IMPRESSION: Osteopenia. No fracture or dislocation of the lumbar spine. Moderate to severe multilevel disc degenerative disease and osteophytosis. Electronically Signed   By: Eddie Candle M.D.   On: 09/01/2019 14:42   DG Shoulder Left Port  Result Date: 09/01/2019 CLINICAL DATA:  Left shoulder pain after fall EXAM: LEFT SHOULDER COMPARISON:  None. FINDINGS: No acute fracture or dislocation. Mild degenerative changes of the glenohumeral and acromioclavicular joints. No focal soft tissue swelling. There are surgical clips in the left axillary region. IMPRESSION: No acute fracture or dislocation of the left shoulder. Electronically Signed   By: Davina Poke D.O.   On: 09/01/2019 17:52   DG Hip Unilat W or Wo Pelvis 2-3 Views Left  Result Date: 09/01/2019 CLINICAL DATA:  Post fall, now with left hip pain. EXAM: DG HIP (WITH OR WITHOUT PELVIS) 2-3V LEFT COMPARISON:  None. FINDINGS: There is a comminuted intertrochanteric left femur fracture with associated foreshortening and accentuated varus angulation the fracture extends to involve the base of the lesser trochanter which is mildly displaced. No definite intra-articular extension. No additional fractures identified. Expected adjacent soft tissue swelling.  No radiopaque foreign body. Ventriculoperitoneal catheter tubing overlies the midline of the lower abdomen. Multiple phleboliths overlie the midline of lower pelvis. IMPRESSION: Acute, comminuted, displaced left-sided intertrochanteric femur fracture with associated foreshortening and varus angulation. Electronically Signed   By: Sandi Mariscal M.D.   On: 09/01/2019 14:44    Positive ROS: All other systems have been reviewed and were otherwise negative with the exception of those mentioned in the HPI and as above.  Physical Exam: General: Alert, no acute distress Cardiovascular: No pedal edema Respiratory: No cyanosis, no use of accessory musculature GI: No organomegaly, abdomen is soft and non-tender Skin: No lesions in the area of chief complaint Neurologic: Sensation intact distally Psychiatric: Patient is competent for consent with normal mood and affect Lymphatic: No axillary or cervical lymphadenopathy  MUSCULOSKELETAL:  Left lower extremity: Left leg shortened and externally rotated. No bruising or abrasions about the left hip or thigh. Sensation intact bilateral LE. Distal pulses 2+ bilaterally.  Assessment: Displaced left intertrochanteric femur fracture  Plan:  Plan for open reduction internal fixation of her left hip fracture with IM nail tomorrow afternoon with Dr. Alvan Dame. This was discussed with Carol Duarte. She will be NPO after  midnight. Continued pain control. Dr. Alvan Dame will see the patient tomorrow morning as well, to review treatment options, as well as risks, benefits, and expectations of the planned procedure. We will call her daughter, Carol Duarte [phone] to update her as well.     Maurice March, PA-C Cell [phone]   09/02/2019 8:31 AM

## 2019-09-02 NOTE — Plan of Care (Signed)

## 2019-09-02 NOTE — Progress Notes (Signed)
PIV consult: IV started, found after 24g placed that patient has R chest port. Port accessed, site unremarkable. Terri, RN made aware.

## 2019-09-02 NOTE — ED Notes (Signed)
Report given to [facility] with [facility]

## 2019-09-03 ENCOUNTER — Encounter (HOSPITAL_COMMUNITY)
Admission: EM | Disposition: A | Discharge status: Skilled Nursing Facility | Payer: Self-pay | Source: Home / Self Care | Attending: Internal Medicine

## 2019-09-03 ENCOUNTER — Inpatient Hospital Stay (HOSPITAL_COMMUNITY): Payer: Medicare Other | Admitting: Anesthesiology

## 2019-09-03 ENCOUNTER — Inpatient Hospital Stay (HOSPITAL_COMMUNITY): Payer: Medicare Other

## 2019-09-03 ENCOUNTER — Encounter (HOSPITAL_COMMUNITY): Payer: Self-pay | Admitting: Family Medicine

## 2019-09-03 HISTORY — PX: ORIF FEMUR FRACTURE: SHX2119

## 2019-09-03 LAB — URINALYSIS, ROUTINE W REFLEX MICROSCOPIC
Bacteria, UA: NONE SEEN
Bilirubin Urine: NEGATIVE
Glucose, UA: NEGATIVE mg/dL
Ketones, ur: NEGATIVE mg/dL
Leukocytes,Ua: NEGATIVE
Nitrite: NEGATIVE
Protein, ur: NEGATIVE mg/dL
Specific Gravity, Urine: 1.014 (ref 1.005–1.030)
pH: 6 (ref 5.0–8.0)

## 2019-09-03 LAB — MRSA PCR SCREENING: MRSA by PCR: NEGATIVE

## 2019-09-03 SURGERY — OPEN REDUCTION INTERNAL FIXATION (ORIF) DISTAL FEMUR FRACTURE
Anesthesia: General | Laterality: Left

## 2019-09-03 MED ORDER — ESMOLOL HCL 100 MG/10ML IV SOLN
INTRAVENOUS | Status: AC
  Filled 2019-09-03: qty 10

## 2019-09-03 MED ORDER — DOCUSATE SODIUM 100 MG PO CAPS
100.0000 mg | ORAL_CAPSULE | Freq: Two times a day (BID) | ORAL | Status: DC
  Administered 2019-09-03 – 2019-09-07 (×8): 100 mg via ORAL
  Filled 2019-09-03 (×8): qty 1

## 2019-09-03 MED ORDER — SUGAMMADEX SODIUM 200 MG/2ML IV SOLN
INTRAVENOUS | Status: DC | PRN
  Administered 2019-09-03: 120 mg via INTRAVENOUS

## 2019-09-03 MED ORDER — METOCLOPRAMIDE HCL 5 MG PO TABS: 5.0000 mg | ORAL_TABLET | Freq: Three times a day (TID) | ORAL | Status: DC | PRN

## 2019-09-03 MED ORDER — PHENYLEPHRINE 40 MCG/ML (10ML) SYRINGE FOR IV PUSH (FOR BLOOD PRESSURE SUPPORT)
PREFILLED_SYRINGE | INTRAVENOUS | Status: AC
  Filled 2019-09-03: qty 10

## 2019-09-03 MED ORDER — LACTATED RINGERS IV SOLN: INTRAVENOUS | Status: DC

## 2019-09-03 MED ORDER — FENTANYL CITRATE (PF) 100 MCG/2ML IJ SOLN
INTRAMUSCULAR | Status: DC | PRN
  Administered 2019-09-03: 50 ug via INTRAVENOUS
  Administered 2019-09-03 (×2): 25 ug via INTRAVENOUS

## 2019-09-03 MED ORDER — METOCLOPRAMIDE HCL 5 MG/ML IJ SOLN: 5.0000 mg | Freq: Three times a day (TID) | INTRAMUSCULAR | Status: DC | PRN

## 2019-09-03 MED ORDER — CEFAZOLIN SODIUM-DEXTROSE 2-4 GM/100ML-% IV SOLN
2.0000 g | Freq: Four times a day (QID) | INTRAVENOUS | Status: AC
  Administered 2019-09-03 – 2019-09-04 (×2): 2 g via INTRAVENOUS
  Filled 2019-09-03 (×2): qty 100

## 2019-09-03 MED ORDER — ACETAMINOPHEN 325 MG PO TABS: 325.0000 mg | ORAL_TABLET | Freq: Four times a day (QID) | ORAL | Status: DC | PRN

## 2019-09-03 MED ORDER — METHOCARBAMOL 1000 MG/10ML IJ SOLN: 500.0000 mg | Freq: Four times a day (QID) | INTRAVENOUS | Status: DC | PRN

## 2019-09-03 MED ORDER — HYDROCODONE-ACETAMINOPHEN 5-325 MG PO TABS
1.0000 | ORAL_TABLET | ORAL | Status: DC | PRN
  Administered 2019-09-04: 1 via ORAL
  Filled 2019-09-03: qty 2

## 2019-09-03 MED ORDER — MENTHOL 3 MG MT LOZG: 1.0000 | LOZENGE | OROMUCOSAL | Status: DC | PRN

## 2019-09-03 MED ORDER — DEXAMETHASONE SODIUM PHOSPHATE 10 MG/ML IJ SOLN
INTRAMUSCULAR | Status: DC | PRN
  Administered 2019-09-03: 6 mg via INTRAVENOUS

## 2019-09-03 MED ORDER — FENTANYL CITRATE (PF) 100 MCG/2ML IJ SOLN
INTRAMUSCULAR | Status: AC
  Filled 2019-09-03: qty 2

## 2019-09-03 MED ORDER — FENTANYL CITRATE (PF) 100 MCG/2ML IJ SOLN: 25.0000 ug | INTRAMUSCULAR | Status: DC | PRN

## 2019-09-03 MED ORDER — PHENOL 1.4 % MT LIQD
1.0000 | OROMUCOSAL | Status: DC | PRN
  Filled 2019-09-03: qty 177

## 2019-09-03 MED ORDER — ASPIRIN EC 325 MG PO TBEC
325.0000 mg | DELAYED_RELEASE_TABLET | Freq: Two times a day (BID) | ORAL | Status: DC
  Administered 2019-09-04 – 2019-09-07 (×7): 325 mg via ORAL
  Filled 2019-09-03 (×7): qty 1

## 2019-09-03 MED ORDER — SODIUM CHLORIDE 0.9 % IR SOLN
Status: DC | PRN
  Administered 2019-09-03: 1000 mL

## 2019-09-03 MED ORDER — TRANEXAMIC ACID-NACL 1000-0.7 MG/100ML-% IV SOLN
INTRAVENOUS | Status: AC
  Filled 2019-09-03: qty 100

## 2019-09-03 MED ORDER — ROCURONIUM BROMIDE 10 MG/ML (PF) SYRINGE
PREFILLED_SYRINGE | INTRAVENOUS | Status: DC | PRN
  Administered 2019-09-03: 50 mg via INTRAVENOUS

## 2019-09-03 MED ORDER — ESMOLOL HCL 100 MG/10ML IV SOLN
INTRAVENOUS | Status: DC | PRN
  Administered 2019-09-03 (×3): 10 mg via INTRAVENOUS

## 2019-09-03 MED ORDER — METHOCARBAMOL 500 MG PO TABS: 500.0000 mg | ORAL_TABLET | Freq: Four times a day (QID) | ORAL | Status: DC | PRN

## 2019-09-03 MED ORDER — LIDOCAINE 2% (20 MG/ML) 5 ML SYRINGE
INTRAMUSCULAR | Status: DC | PRN
  Administered 2019-09-03: 50 mg via INTRAVENOUS

## 2019-09-03 MED ORDER — PROPOFOL 10 MG/ML IV BOLUS
INTRAVENOUS | Status: AC
  Filled 2019-09-03: qty 20

## 2019-09-03 MED ORDER — TRANEXAMIC ACID-NACL 1000-0.7 MG/100ML-% IV SOLN
INTRAVENOUS | Status: DC | PRN
  Administered 2019-09-03: 1000 mg via INTRAVENOUS

## 2019-09-03 MED ORDER — PROPOFOL 10 MG/ML IV BOLUS
INTRAVENOUS | Status: DC | PRN
  Administered 2019-09-03: 70 mg via INTRAVENOUS

## 2019-09-03 SURGICAL SUPPLY — 56 items
ADH SKN CLS APL DERMABOND .7 (GAUZE/BANDAGES/DRESSINGS) ×1
BAG SPEC THK2 15X12 ZIP CLS (MISCELLANEOUS) ×1
BAG ZIPLOCK 12X15 (MISCELLANEOUS) ×3 IMPLANT
BIT DRILL CANN LG 4.3MM (BIT) IMPLANT
BLADE SAW SGTL 11.0X1.19X90.0M (BLADE) IMPLANT
BNDG COHESIVE 4X5 TAN STRL (GAUZE/BANDAGES/DRESSINGS) ×3 IMPLANT
BNDG GAUZE ELAST 4 BULKY (GAUZE/BANDAGES/DRESSINGS) ×3 IMPLANT
CLOSURE WOUND 1/2 X4 (GAUZE/BANDAGES/DRESSINGS) ×1
COVER SURGICAL LIGHT HANDLE (MISCELLANEOUS) ×3 IMPLANT
COVER WAND RF STERILE (DRAPES) IMPLANT
DERMABOND ADVANCED (GAUZE/BANDAGES/DRESSINGS) ×2
DERMABOND ADVANCED .7 DNX12 (GAUZE/BANDAGES/DRESSINGS) IMPLANT
DRAPE C-ARM 42X120 X-RAY (DRAPES) ×3 IMPLANT
DRAPE C-ARMOR (DRAPES) ×3 IMPLANT
DRAPE ORTHO SPLIT 77X108 STRL (DRAPES)
DRAPE SHEET LG 3/4 BI-LAMINATE (DRAPES) IMPLANT
DRAPE SURG ORHT 6 SPLT 77X108 (DRAPES) IMPLANT
DRILL BIT CANN LG 4.3MM (BIT) ×3
DRSG AQUACEL AG ADV 3.5X 4 (GAUZE/BANDAGES/DRESSINGS) ×4 IMPLANT
DRSG AQUACEL AG ADV 3.5X 6 (GAUZE/BANDAGES/DRESSINGS) ×4 IMPLANT
DRSG EMULSION OIL 3X16 NADH (GAUZE/BANDAGES/DRESSINGS) ×3 IMPLANT
DURAPREP 26ML APPLICATOR (WOUND CARE) ×3 IMPLANT
ELECT REM PT RETURN 15FT ADLT (MISCELLANEOUS) ×3 IMPLANT
FACESHIELD WRAPAROUND (MASK) ×6 IMPLANT
FACESHIELD WRAPAROUND OR TEAM (MASK) IMPLANT
GAUZE SPONGE 4X4 12PLY STRL (GAUZE/BANDAGES/DRESSINGS) ×3 IMPLANT
GLOVE BIOGEL M 7.0 STRL (GLOVE) IMPLANT
GLOVE BIOGEL PI IND STRL 7.5 (GLOVE) ×1 IMPLANT
GLOVE BIOGEL PI IND STRL 8.5 (GLOVE) ×1 IMPLANT
GLOVE BIOGEL PI INDICATOR 7.5 (GLOVE) ×2
GLOVE BIOGEL PI INDICATOR 8.5 (GLOVE) ×2
GLOVE ECLIPSE 8.0 STRL XLNG CF (GLOVE) ×3 IMPLANT
GLOVE ORTHO TXT STRL SZ7.5 (GLOVE) ×6 IMPLANT
GLOVE SURG ORTHO 8.0 STRL STRW (GLOVE) ×3 IMPLANT
GOWN STRL REUS W/TWL LRG LVL3 (GOWN DISPOSABLE) ×3 IMPLANT
GOWN STRL REUS W/TWL XL LVL3 (GOWN DISPOSABLE) ×6 IMPLANT
GUIDEPIN 3.2X17.5 THRD DISP (PIN) ×2 IMPLANT
KIT BASIN OR (CUSTOM PROCEDURE TRAY) ×3 IMPLANT
KIT TURNOVER KIT A (KITS) IMPLANT
MANIFOLD NEPTUNE II (INSTRUMENTS) ×3 IMPLANT
NAIL HIP FRACT 130D 9X180 (Orthopedic Implant) ×2 IMPLANT
NS IRRIG 1000ML POUR BTL (IV SOLUTION) ×3 IMPLANT
PACK TOTAL JOINT (CUSTOM PROCEDURE TRAY) ×3 IMPLANT
PENCIL SMOKE EVACUATOR (MISCELLANEOUS) ×2 IMPLANT
PROTECTOR NERVE ULNAR (MISCELLANEOUS) ×3 IMPLANT
SCREW BONE CORTICAL 5.0X3 (Screw) ×2 IMPLANT
SCREW LAG HIP NAIL 10.5X95 (Screw) ×2 IMPLANT
STAPLER VISISTAT 35W (STAPLE) ×3 IMPLANT
STRIP CLOSURE SKIN 1/2X4 (GAUZE/BANDAGES/DRESSINGS) ×2 IMPLANT
SUT MNCRL AB 4-0 PS2 18 (SUTURE) ×3 IMPLANT
SUT VIC AB 1 CT1 36 (SUTURE) ×6 IMPLANT
SUT VIC AB 2-0 CT1 27 (SUTURE) ×6
SUT VIC AB 2-0 CT1 TAPERPNT 27 (SUTURE) ×2 IMPLANT
TOWEL OR 17X26 10 PK STRL BLUE (TOWEL DISPOSABLE) ×6 IMPLANT
URINEMETER 200ML W/220 (MISCELLANEOUS) ×2 IMPLANT
WATER STERILE IRR 1000ML POUR (IV SOLUTION) ×3 IMPLANT

## 2019-09-03 NOTE — Anesthesia Postprocedure Evaluation (Signed)
Anesthesia Post Note  Patient: Carol Duarte  Procedure(s) Performed: OPEN REDUCTION INTERNAL FIXATION (ORIF) LEFT HIP FEMUR FRACTURE (Left )     Patient location during evaluation: PACU Anesthesia Type: General Level of consciousness: patient cooperative, oriented and sedated Pain management: pain level controlled Vital Signs Assessment: post-procedure vital signs reviewed and stable Respiratory status: spontaneous breathing, nonlabored ventilation, respiratory function stable and patient connected to nasal cannula oxygen Cardiovascular status: blood pressure returned to baseline and stable Postop Assessment: no apparent nausea or vomiting Anesthetic complications: no    Last Vitals:  Vitals:   09/03/19 1615 09/03/19 1634  BP: (!) 169/89 (!) 159/70  Pulse: 73 72  Resp: (!) 22 16  Temp: 36.6 C 36.6 C  SpO2: 100% 94%    Last Pain:  Vitals:   09/03/19 1615  TempSrc:   PainSc: 0-No pain                 Tinsleigh Slovacek,E. Hadyn Blanck

## 2019-09-03 NOTE — Transfer of Care (Signed)
Immediate Anesthesia Transfer of Care Note  Patient: Carol Duarte  Procedure(s) Performed: OPEN REDUCTION INTERNAL FIXATION (ORIF) LEFT HIP FEMUR FRACTURE (Left )  Patient Location: PACU  Anesthesia Type:General  Level of Consciousness: awake, alert , oriented and patient cooperative  Airway & Oxygen Therapy: Patient Spontanous Breathing and Patient connected to face mask oxygen  Post-op Assessment: Report given to RN and Post -op Vital signs reviewed and stable  Post vital signs: Reviewed and stable  Last Vitals:  Vitals Value Taken Time  BP 199/77 09/03/19 1448  Temp    Pulse 64 09/03/19 1451  Resp 18 09/03/19 1451  SpO2 100 % 09/03/19 1451  Vitals shown include unvalidated device data.  Last Pain:  Vitals:   09/03/19 1129  TempSrc: Oral  PainSc:       Patients Stated Pain Goal: 2 (04/88/89 1694)  Complications: No apparent anesthesia complications

## 2019-09-03 NOTE — Progress Notes (Signed)
PROGRESS NOTE    Carol Duarte  YQM:[id number] DOB: Jun 07, 1925 DOA: 09/01/2019 PCP: [name], MD   Brief Narrative:  Patient is a 86 year old female with history of hypertension, hyperlipidemia, breast cancer who presents to the emergency department with complaint of left hip pain after falling from a bathroom commode.  X-ray imaging done in the emergency department showed acute, comminuted, displaced left-sided intertrochanteric fracture.  Orthopedics consulted.  Planning for ORIF today.  Assessment & Plan:   Active Problems:   Hip fracture (HCC)   Left intertrochanteric femur fracture: X-ray as above.  Orthopedics following.  Planning for surgery today.  Continue pain management, supportive care.  Heparin [place] for DVT prophylaxis for now.  PT/OT will be consulted after surgery.  Pain well controlled. Patient  is usually physically active, ambulates with the help of walker.  History of left breast cancer: On trastuzumab.  Follows with her oncologist at [facility].  Status post mastectomy.  Leukocytosis: Most likely reactive.  Mild  Hypertension: Currently blood pressure well controlled.  Continue home medicines  CKD stage II: Currently kidney function at baseline.         DVT prophylaxis:Heparin [place] Code Status: Full Family Communication: None present at the bedside Disposition Plan: Most likely skilled nursing facility.   Consultants: Orthopedics  Procedures: None  Antimicrobials:  Anti-infectives (From admission, onward)   Start     Dose/Rate Route Frequency Ordered Stop   09/03/19 0600  ceFAZolin (ANCEF) IVPB 2g/100 mL premix     2 g 200 mL/hr over 30 Minutes Intravenous On call to O.R. 09/02/19 1936 09/03/19 1315      Subjective: Patient seen and examined the bedside this morning.  Hemodynamically stable.  Comfortable.  Pain well controlled.Waiting for surgery  Objective: Vitals:   09/02/19 2007 09/03/19 0553 09/03/19 1003 09/03/19 1129  BP: 137/62  (!) 150/68 135/68 (!) 157/67  Pulse: 61 68 68 67  Resp: 20 20 16 18   Temp: 98.3 F (36.8 C) 98.7 F (37.1 C) 99 F (37.2 C) 98.2 F (36.8 C)  TempSrc: Oral Oral Oral Oral  SpO2: 95% 98% 99% 96%  Weight:      Height:        Intake/Output Summary (Last 24 hours) at 09/03/2019 1331 Last data filed at 09/03/2019 1317 Gross per 24 hour  Intake 1177.76 ml  Output 1450 ml  Net -272.24 ml   Filed Weights   09/01/19 1321  Weight: 55.3 kg    Examination:  General exam: Pleasant elderly female HEENT:PERRL,Oral mucosa moist, Ear/Nose normal on gross exam Respiratory system: Bilateral equal air entry, normal vesicular breath sounds, no wheezes or crackles  Cardiovascular system: S1 & S2 heard, RRR. No JVD, murmurs, rubs, gallops or clicks. No pedal edema.  Chemo-Port on the right chest Gastrointestinal system: Abdomen is nondistended, soft and nontender. No organomegaly or masses felt. Normal bowel sounds heard. Central nervous system: Alert and oriented. No focal neurological deficits. Extremities: No edema, no clubbing ,no cyanosis, tenderness on the left hip, decreased range of motion on the left lower extremity  skin: No rashes, lesions or ulcers,no icterus ,no pallor      Data Reviewed: I have personally reviewed following labs and imaging studies  CBC: Recent Labs  Lab 09/01/19 1527 09/02/19 0322  WBC 11.9* 10.6*  NEUTROABS 9.8* 8.5*  HGB 11.9* 11.7*  HCT 37.2 36.9  MCV 91.6 92.7  PLT 195 528   Basic Metabolic Panel: Recent Labs  Lab 09/01/19 1527 09/02/19 0322  NA 134*  137  K 4.1 4.8  CL 101 102  CO2 21* 23  GLUCOSE 152* 154*  BUN 32* 36*  CREATININE 1.11* 1.21*  CALCIUM 9.1 9.3  MG  --  2.6*   GFR: Estimated Creatinine Clearance: 22.4 mL/min (A) (by C-G formula based on SCr of 1.21 mg/dL (H)). Liver Function Tests: Recent Labs  Lab 09/02/19 0322  AST 37  ALT 32  ALKPHOS 61  BILITOT 0.7  PROT 6.8  ALBUMIN 3.9   No results for input(s): LIPASE,  AMYLASE in the last 168 hours. No results for input(s): AMMONIA in the last 168 hours. Coagulation Profile: No results for input(s): INR, PROTIME in the last 168 hours. Cardiac Enzymes: No results for input(s): CKTOTAL, CKMB, CKMBINDEX, TROPONINI in the last 168 hours. BNP (last 3 results) No results for input(s): PROBNP in the last 8760 hours. HbA1C: No results for input(s): HGBA1C in the last 72 hours. CBG: Recent Labs  Lab 09/01/19 2200 09/02/19 0544 09/02/19 0759  GLUCAP 184* 142* 126*   Lipid Profile: No results for input(s): CHOL, HDL, LDLCALC, TRIG, CHOLHDL, LDLDIRECT in the last 72 hours. Thyroid Function Tests: No results for input(s): TSH, T4TOTAL, FREET4, T3FREE, THYROIDAB in the last 72 hours. Anemia Panel: No results for input(s): VITAMINB12, FOLATE, FERRITIN, TIBC, IRON, RETICCTPCT in the last 72 hours. Sepsis Labs: No results for input(s): PROCALCITON, LATICACIDVEN in the last 168 hours.  Recent Results (from the past 240 hour(s))  SARS CORONAVIRUS 2 (TAT 6-24 HRS) Nasopharyngeal Nasopharyngeal Swab     Status: None   Collection Time: 09/01/19  4:18 PM   Specimen: Nasopharyngeal Swab  Result Value Ref Range Status   SARS Coronavirus 2 NEGATIVE NEGATIVE Final    Comment: (NOTE) SARS-CoV-2 target nucleic acids are NOT DETECTED. The SARS-CoV-2 RNA is generally detectable in upper and lower respiratory specimens during the acute phase of infection. Negative results do not preclude SARS-CoV-2 infection, do not rule out co-infections with other pathogens, and should not be used as the sole basis for treatment or other patient management decisions. Negative results must be combined with clinical observations, patient history, and epidemiological information. The expected result is Negative. Fact Sheet for Patients: SugarRoll.be Fact Sheet for Healthcare Providers: https://www.woods-mathews.com/ This test is not yet  approved or cleared by the [place] FDA and  has been authorized for detection and/or diagnosis of SARS-CoV-2 by FDA under an Emergency Use Authorization (EUA). This EUA will remain  in effect (meaning this test can be used) for the duration of the COVID-19 declaration under Section 56 4(b)(1) of the Act, 21 U.S.C. section 360bbb-3(b)(1), unless the authorization is terminated or revoked sooner. Performed at [facility], [place] [address] [place], [place] [address]   Respiratory Panel by RT PCR (Flu A&B, Covid) - Nasopharyngeal Swab     Status: None   Collection Time: 09/01/19  9:21 PM   Specimen: Nasopharyngeal Swab  Result Value Ref Range Status   SARS Coronavirus 2 by RT PCR NEGATIVE NEGATIVE Final    Comment: (NOTE) SARS-CoV-2 target nucleic acids are NOT DETECTED. The SARS-CoV-2 RNA is generally detectable in upper respiratoy specimens during the acute phase of infection. The lowest concentration of SARS-CoV-2 viral copies this assay can detect is 131 copies/mL. A negative result does not preclude SARS-Cov-2 infection and should not be used as the sole basis for treatment or other patient management decisions. A negative result may occur with  improper specimen collection/handling, submission of specimen other than nasopharyngeal swab, presence of  viral mutation(s) within the areas targeted by this assay, and inadequate number of viral copies (<131 copies/mL). A negative result must be combined with clinical observations, patient history, and epidemiological information. The expected result is Negative. Fact Sheet for Patients:  PinkCheek.be Fact Sheet for Healthcare Providers:  GravelBags.it This test is not yet ap proved or cleared by the [place] FDA and  has been authorized for detection and/or diagnosis of SARS-CoV-2 by FDA under an Emergency Use Authorization (EUA). This EUA will remain  in effect  (meaning this test can be used) for the duration of the COVID-19 declaration under Section 564(b)(1) of the Act, 21 U.S.C. section 360bbb-3(b)(1), unless the authorization is terminated or revoked sooner.    Influenza A by PCR NEGATIVE NEGATIVE Final   Influenza B by PCR NEGATIVE NEGATIVE Final    Comment: (NOTE) The Xpert Xpress SARS-CoV-2/FLU/RSV assay is intended as an aid in  the diagnosis of influenza from Nasopharyngeal swab specimens and  should not be used as a sole basis for treatment. Nasal washings and  aspirates are unacceptable for Xpert Xpress SARS-CoV-2/FLU/RSV  testing. Fact Sheet for Patients: PinkCheek.be Fact Sheet for Healthcare Providers: GravelBags.it This test is not yet approved or cleared by the [place] FDA and  has been authorized for detection and/or diagnosis of SARS-CoV-2 by  FDA under an Emergency Use Authorization (EUA). This EUA will remain  in effect (meaning this test can be used) for the duration of the  Covid-19 declaration under Section 564(b)(1) of the Act, 21  U.S.C. section 360bbb-3(b)(1), unless the authorization is  terminated or revoked. Performed at [facility], [address] [place], [place] [address]   MRSA PCR Screening     Status: None   Collection Time: 09/02/19  3:12 AM   Specimen: Nasopharyngeal  Result Value Ref Range Status   MRSA by PCR NEGATIVE NEGATIVE Final    Comment:        The GeneXpert MRSA Assay (FDA approved for NASAL specimens only), is one component of a comprehensive MRSA colonization surveillance program. It is not intended to diagnose MRSA infection nor to guide or monitor treatment for MRSA infections. Performed at [facility], [place] [address], [place], [place] [address]          Radiology Studies: DG Lumbar Spine Complete  Result Date: 09/01/2019 CLINICAL DATA:  Fall, left hip pain EXAM: LUMBAR SPINE - COMPLETE 4+  VIEW COMPARISON:  05/24/2019 FINDINGS: Osteopenia. No fracture or dislocation of the lumbar spine. There is moderate to severe multilevel disc degenerative disease and osteophytosis. Shunt catheter tubing projects about the abdomen and pelvis. Moderate burden of stool in the colon. IMPRESSION: Osteopenia. No fracture or dislocation of the lumbar spine. Moderate to severe multilevel disc degenerative disease and osteophytosis. Electronically Signed   By: Eddie Candle M.D.   On: 09/01/2019 14:42   DG Shoulder Left Port  Result Date: 09/01/2019 CLINICAL DATA:  Left shoulder pain after fall EXAM: LEFT SHOULDER COMPARISON:  None. FINDINGS: No acute fracture or dislocation. Mild degenerative changes of the glenohumeral and acromioclavicular joints. No focal soft tissue swelling. There are surgical clips in the left axillary region. IMPRESSION: No acute fracture or dislocation of the left shoulder. Electronically Signed   By: Davina Poke D.O.   On: 09/01/2019 17:52   DG Hip Unilat W or Wo Pelvis 2-3 Views Left  Result Date: 09/01/2019 CLINICAL DATA:  Post fall, now with left hip pain. EXAM: DG HIP (WITH OR WITHOUT PELVIS)  2-3V LEFT COMPARISON:  None. FINDINGS: There is a comminuted intertrochanteric left femur fracture with associated foreshortening and accentuated varus angulation the fracture extends to involve the base of the lesser trochanter which is mildly displaced. No definite intra-articular extension. No additional fractures identified. Expected adjacent soft tissue swelling.  No radiopaque foreign body. Ventriculoperitoneal catheter tubing overlies the midline of the lower abdomen. Multiple phleboliths overlie the midline of lower pelvis. IMPRESSION: Acute, comminuted, displaced left-sided intertrochanteric femur fracture with associated foreshortening and varus angulation. Electronically Signed   By: Sandi Mariscal M.D.   On: 09/01/2019 14:44        Scheduled Meds: . [MAR Hold] amLODipine  10 mg  Oral Daily  . [MAR Hold] atorvastatin  20 mg Oral q1800  . [MAR Hold] Chlorhexidine Gluconate Cloth  6 each Topical Daily  . [MAR Hold] ferrous sulfate  325 mg Oral Q breakfast  . [MAR Hold] heparin  5,000 Units Subcutaneous Q8H  . [MAR Hold] labetalol  100 mg Oral Daily  . [MAR Hold] LORazepam  1 mg Oral Daily  . [MAR Hold] oxybutynin  5 mg Oral Daily   Continuous Infusions: . sodium chloride 50 mL/hr at 09/02/19 2144  . lactated ringers 20 mL/hr at 09/03/19 1152  . tranexamic acid       LOS: 2 days    Time spent: 25 mins.More than 50% of that time was spent in counseling and/or coordination of care.      Shelly Coss, MD [facility] Pager [phone]  If 7PM-7AM, please contact night-coverage [email or web address] Password [email or web address] 09/03/2019, 1:31 PM

## 2019-09-03 NOTE — Op Note (Signed)
NAMETELESIA, ATES APPLE MEDICAL RECORD IR:[id number] ACCOUNT [id number] DATE OF BIRTH:Jan 03, 1925 FACILITY: [facility] LOCATION: [facility] PHYSICIAN:Akash Winski Marian Sorrow, MD  OPERATIVE REPORT  DATE OF PROCEDURE:  09/03/2019  PREOPERATIVE DIAGNOSIS:  Comminuted left intertrochanteric femur fracture.  POSTOPERATIVE DIAGNOSIS:  Comminuted left intertrochanteric femur fracture.  PROCEDURE:  Open reduction internal fixation, left intertrochanteric femur fracture.    COMPONENTS USED:  A Biomet Zimmer Affixus trochanteric nail, 9 x 180 mm with 130-degree lag screw measuring 95 mm and a distal interlock.  SURGEON:  Paralee Cancel, MD  ASSISTANT:  Griffith Citron, PA-C.  Note that Ms. Nehemiah Settle was present for the entirety of the case from preoperative positioning, perioperative management of the operative extremity, general facilitation of the case and primary wound closure.  ANESTHESIA:  General.  SPECIMENS:  None.  COMPLICATIONS:  None.  ESTIMATED BLOOD LOSS:  Less  than 100 mL.  INDICATIONS:  The patient is a 86 year old female currently undergoing treatment for breast cancer.  She has been having some left hip pain.  Recent PET scan did not indicate any evidence of pathologic lesion.  However, she had a ground level fall and  sustained left hip pain.  She was taken to the emergency room at [facility] where radiographs indicated an intertrochanteric femur fracture.  She was transferred to [facility] for definitive management.  She was seen and evaluated upon  admission to the hospitalist service.  She was noted to have a shortened and externally rotated left lower extremity.  Surgery was scheduled.  The procedure was reviewed in terms of the necessity with the patient and her daughter.  Risks of malunion,  nonunion, need for future surgery reviewed as well as standard risk of infection, DVT.  Consent was obtained for fracture management.  DESCRIPTION OF PROCEDURE:  The patient was  brought to the operative theater.  Once adequate anesthesia, preoperative antibiotics, Ancef administered as well as tranexamic acid, she was positioned supine on the fracture Hana table, setup with all bony  prominences carefully padded and protected.  Fluoroscopy was used as traction and internal rotation was applied to the lower extremity.  We were able to get the fracture into a near anatomically reduced position.  Once this was confirmed, the left hip  was prepped and draped in sterile fashion using shower curtain technique.  A timeout was performed identifying the patient, the planned procedure and extremity.  Fluoroscopy was brought back to the field, identifying the tip of the trochanter.  An incision was then made proximal to the lateral aspect of the trochanter.  Dissection was carried down through the gluteal fascia.  A guidewire was then inserted into  the tip of the trochanter and passed across the fracture site, confirmed radiographically.  I then opened up the proximal femur with a starting drill.  I then passed the 9 x 180 nail by hand to the appropriate depth.  Once it was well seated, I used the  insertion jig and the triple gold guidewire jig.  An incision was made lateral in the skin and the guide placed down onto the lateral femur.  The guidewire was then inserted into the femoral head in AP and lateral planes, confirmed that it was centrally  located.  I measured and selected a 95 mm nail.  I drilled and then passed the nail.  Once the nail was at its appropriate depth, I then let the gross traction off the lower extremity and used the compression wheel to medialize the shaft to  the fracture  site.  I then tightened the locking bolt proximally fully and then backed it off a quarter turn to allow for compression.  Once this was done, the triple green guide was inserted into the static position distally with the skin incision.  We drilled and  placed a 34 mm screw.  Once this was done,  radiographs were obtained in AP and lateral plane, confirming the near anatomic reduction.  The wounds were irrigated.  The jig was removed.  The gluteal fascia was reapproximated using #1 Vicryl.  The remainder  of the wounds were closed with 2-0 Vicryl, subcutaneous stitch.  The wounds were cleaned, dried and dressed sterilely using surgical glue and Aquacel dressing.  She was then brought to the recovery room in stable condition, tolerating the procedure  well.  Postoperatively, she will be partial weightbearing for 4-6 weeks to allow for fracture healing.  In the hospital, she will be evaluated by physical therapy to assess  her needs in the short postop.  VN/NUANCE  D:09/03/2019 T:09/03/2019 JOB:[id number]

## 2019-09-03 NOTE — H&P (View-Only) (Signed)
Patient ID: Carol Duarte, female   DOB: 09/02/1924, 86 y.o.   MRN: [id number] Subjective:   Left hip fracture    Patient reports pain as mild at the time. Pain with movement  Objective:   VITALS:   Vitals:   09/02/19 2007 09/03/19 0553  BP: 137/62 (!) 150/68  Pulse: 61 68  Resp: 20 20  Temp: 98.3 F (36.8 C) 98.7 F (37.1 C)  SpO2: 95% 98%   LLE - shortened and externally rotated NVI  LABS Recent Labs    09/01/19 1527 09/02/19 0322  HGB 11.9* 11.7*  HCT 37.2 36.9  WBC 11.9* 10.6*  PLT 195 190    Recent Labs    09/01/19 1527 09/02/19 0322  NA 134* 137  K 4.1 4.8  BUN 32* 36*  CREATININE 1.11* 1.21*  GLUCOSE 152* 154*    No results for input(s): LABPT, INR in the last 72 hours.   Assessment/Plan:   Procedure(s) (LRB): OPEN REDUCTION INTERNAL FIXATION (ORIF) LEFT HIP FEMUR FRACTURE (Left)   Plan: NPO To OR this pm for ORIF of the left hip Consent ordered Reviewed plan with his daughter

## 2019-09-03 NOTE — Anesthesia Procedure Notes (Addendum)
Procedure Name: Intubation Date/Time: 09/03/2019 1:12 PM Performed by: Annye Asa, MD Pre-anesthesia Checklist: Patient identified, Emergency Drugs available, Suction available, Patient being monitored and Timeout performed Patient Re-evaluated:Patient Re-evaluated prior to induction Oxygen Delivery Method: Circle system utilized Preoxygenation: Pre-oxygenation with 100% oxygen Induction Type: IV induction Ventilation: Mask ventilation without difficulty Laryngoscope Size: Mac and 3 Grade View: Grade III Tube type: Oral Tube size: 7.0 mm Number of attempts: 3 Airway Equipment and Method: Bougie stylet Placement Confirmation: ETT inserted through vocal cords under direct vision,  positive ETCO2 and breath sounds checked- equal and bilateral Secured at: 22 cm Tube secured with: Tape Dental Injury: Teeth and Oropharynx as per pre-operative assessment  Difficulty Due To: Difficult Airway- due to immobile epiglottis

## 2019-09-03 NOTE — Interval H&P Note (Signed)
History and Physical Interval Note:  09/03/2019 11:25 AM  Herbert [employer]  has presented today for surgery, with the diagnosis of LEFT HIP FRACTURE.  The various methods of treatment have been discussed with the patient and family. After consideration of risks, benefits and other options for treatment, the patient has consented to  Procedure(s): OPEN REDUCTION INTERNAL FIXATION (ORIF) LEFT HIP FEMUR FRACTURE (Left) as a surgical intervention.  The patient's history has been reviewed, patient examined, no change in status, stable for surgery.  I have reviewed the patient's chart and labs.  Questions were answered to the patient's satisfaction.     Mauri Pole

## 2019-09-03 NOTE — Brief Op Note (Signed)
09/01/2019 - 09/03/2019  11:38 AM  PATIENT:  Carol Duarte  86 y.o. female  PRE-OPERATIVE DIAGNOSIS:  LEFT HIP intertrochanteric comminuted FRACTURE  POST-OPERATIVE DIAGNOSIS:  LEFT HIP intertrochanteric comminuted FRACTURE  PROCEDURE:  Procedure(s): OPEN REDUCTION INTERNAL FIXATION (ORIF) LEFT HIP FEMUR FRACTURE (Left)  SURGEON:  Surgeon(s) and Role:    Paralee Cancel, MD - Primary  PHYSICIAN ASSISTANT: Griffith Citron, PA-C  ANESTHESIA:   general  EBL:  <100 cc  BLOOD ADMINISTERED:none  DRAINS: none   LOCAL MEDICATIONS USED:  NONE  SPECIMEN:  No Specimen  DISPOSITION OF SPECIMEN:  N/A  COUNTS:  YES  TOURNIQUET:  * No tourniquets in log *  DICTATION: .Other Dictation: Dictation Number [id number]  PLAN OF CARE: Admit to inpatient   PATIENT DISPOSITION:  PACU - hemodynamically stable.   Delay start of Pharmacological VTE agent (>24hrs) due to surgical blood loss or risk of bleeding: no

## 2019-09-03 NOTE — Progress Notes (Signed)
Spoke with Pt's daughter Armstead Peaks and updated.

## 2019-09-03 NOTE — Anesthesia Preprocedure Evaluation (Addendum)
Anesthesia Evaluation  Patient identified by MRN, date of birth, ID band Patient awake    Reviewed: Allergy & Precautions, NPO status , Patient's Chart, lab work & pertinent test results, reviewed documented beta blocker date and time   History of Anesthesia Complications Negative for: history of anesthetic complications  Airway Mallampati: II  TM Distance: >3 FB Neck ROM: Full    Dental  (+) Dental Advisory Given, Missing   Pulmonary neg pulmonary ROS,  09/01/2019 SARS coronavirus NEG   breath sounds clear to auscultation       Cardiovascular hypertension, Pt. on medications and Pt. on home beta blockers (-) angina Rhythm:Regular Rate:Normal  07/18/2020 ECHO: EF 60-65%, trace MR, mild TR   Neuro/Psych negative neurological ROS     GI/Hepatic negative GI ROS, Neg liver ROS,   Endo/Other  negative endocrine ROS  Renal/GU negative Renal ROS     Musculoskeletal  (+) Arthritis , Osteoarthritis,    Abdominal   Peds  Hematology negative hematology ROS (+)   Anesthesia Other Findings H/o breast cancer  Reproductive/Obstetrics                            Anesthesia Physical Anesthesia Plan  ASA: III  Anesthesia Plan: General   Post-op Pain Management:    Induction: Intravenous  PONV Risk Score and Plan: 3 and Ondansetron, Dexamethasone and Treatment may vary due to age or medical condition  Airway Management Planned: Oral ETT  Additional Equipment:   Intra-op Plan:   Post-operative Plan: Extubation in OR  Informed Consent: I have reviewed the patients History and Physical, chart, labs and discussed the procedure including the risks, benefits and alternatives for the proposed anesthesia with the patient or authorized representative who has indicated his/her understanding and acceptance.     Dental advisory given  Plan Discussed with: CRNA and Surgeon  Anesthesia Plan Comments:          Anesthesia Quick Evaluation

## 2019-09-03 NOTE — Progress Notes (Signed)
Patient ID: Carol Duarte, female   DOB: [birth year], 86 y.o.   MRN: [id number] Subjective:   Left hip fracture    Patient reports pain as mild at the time. Pain with movement  Objective:   VITALS:   Vitals:   09/02/19 2007 09/03/19 0553  BP: 137/62 (!) 150/68  Pulse: 61 68  Resp: 20 20  Temp: 98.3 F (36.8 C) 98.7 F (37.1 C)  SpO2: 95% 98%   LLE - shortened and externally rotated NVI  LABS Recent Labs    09/01/19 1527 09/02/19 0322  HGB 11.9* 11.7*  HCT 37.2 36.9  WBC 11.9* 10.6*  PLT 195 190    Recent Labs    09/01/19 1527 09/02/19 0322  NA 134* 137  K 4.1 4.8  BUN 32* 36*  CREATININE 1.11* 1.21*  GLUCOSE 152* 154*    No results for input(s): LABPT, INR in the last 72 hours.   Assessment/Plan:   Procedure(s) (LRB): OPEN REDUCTION INTERNAL FIXATION (ORIF) LEFT HIP FEMUR FRACTURE (Left)   Plan: NPO To OR this pm for ORIF of the left hip Consent ordered Reviewed plan with his daughter

## 2019-09-04 ENCOUNTER — Encounter: Payer: Self-pay | Admitting: *Deleted

## 2019-09-04 LAB — BASIC METABOLIC PANEL
Anion gap: 8 (ref 5–15)
BUN: 32 mg/dL — ABNORMAL HIGH (ref 8–23)
CO2: 24 mmol/L (ref 22–32)
Calcium: 8.5 mg/dL — ABNORMAL LOW (ref 8.9–10.3)
Chloride: 102 mmol/L (ref 98–111)
Creatinine, Ser: 1.09 mg/dL — ABNORMAL HIGH (ref 0.44–1.00)
GFR calc Af Amer: 51 mL/min — ABNORMAL LOW (ref >60)
GFR calc non Af Amer: 44 mL/min — ABNORMAL LOW (ref >60)
Glucose, Bld: 190 mg/dL — ABNORMAL HIGH (ref 70–99)
Potassium: 4.9 mmol/L (ref 3.5–5.1)
Sodium: 134 mmol/L — ABNORMAL LOW (ref 135–145)

## 2019-09-04 LAB — CBC WITH DIFFERENTIAL/PLATELET
Abs Immature Granulocytes: 0.07 10*3/uL (ref 0.00–0.07)
Basophils Absolute: 0 10*3/uL (ref 0.0–0.1)
Basophils Relative: 0 %
Eosinophils Absolute: 0 10*3/uL (ref 0.0–0.5)
Eosinophils Relative: 0 %
HCT: 26.5 % — ABNORMAL LOW (ref 36.0–46.0)
Hemoglobin: 8.3 g/dL — ABNORMAL LOW (ref 12.0–15.0)
Immature Granulocytes: 1 %
Lymphocytes Relative: 7 %
Lymphs Abs: 0.7 10*3/uL (ref 0.7–4.0)
MCH: 29.7 pg (ref 26.0–34.0)
MCHC: 31.3 g/dL (ref 30.0–36.0)
MCV: 95 fL (ref 80.0–100.0)
Monocytes Absolute: 0.7 10*3/uL (ref 0.1–1.0)
Monocytes Relative: 6 %
Neutro Abs: 9.1 10*3/uL — ABNORMAL HIGH (ref 1.7–7.7)
Neutrophils Relative %: 86 %
Platelets: 188 10*3/uL (ref 150–400)
RBC: 2.79 MIL/uL — ABNORMAL LOW (ref 3.87–5.11)
RDW: 14 % (ref 11.5–15.5)
WBC: 10.5 10*3/uL (ref 4.0–10.5)
nRBC: 0 % (ref 0.0–0.2)

## 2019-09-04 LAB — ABO/RH: ABO/RH(D): A POS

## 2019-09-04 MED ORDER — HYDROCODONE-ACETAMINOPHEN 5-325 MG PO TABS: 1.0000 | ORAL_TABLET | Freq: Four times a day (QID) | ORAL | 0 refills | Status: DC | PRN

## 2019-09-04 MED ORDER — METHOCARBAMOL 500 MG PO TABS: 500.0000 mg | ORAL_TABLET | Freq: Four times a day (QID) | ORAL | 0 refills | Status: DC | PRN

## 2019-09-04 MED ORDER — SENNOSIDES-DOCUSATE SODIUM 8.6-50 MG PO TABS
1.0000 | ORAL_TABLET | Freq: Every day | ORAL | Status: DC
  Administered 2019-09-04 – 2019-09-06 (×3): 1 via ORAL
  Filled 2019-09-04 (×3): qty 1

## 2019-09-04 MED ORDER — POLYETHYLENE GLYCOL 3350 17 G PO PACK
17.0000 g | PACK | Freq: Every day | ORAL | Status: DC
  Administered 2019-09-04 – 2019-09-06 (×3): 17 g via ORAL
  Filled 2019-09-04 (×4): qty 1

## 2019-09-04 MED ORDER — BISACODYL 10 MG RE SUPP
10.0000 mg | Freq: Once | RECTAL | Status: AC
  Administered 2019-09-04: 10 mg via RECTAL
  Filled 2019-09-04: qty 1

## 2019-09-04 MED ORDER — ASPIRIN 325 MG PO TBEC: 325.0000 mg | DELAYED_RELEASE_TABLET | Freq: Two times a day (BID) | ORAL | 0 refills | Status: DC

## 2019-09-04 NOTE — Evaluation (Signed)
Physical Therapy Evaluation Patient Details Name: Carol Duarte MRN: [id number] DOB: [birth year] Today's Date: 09/04/2019   History of Present Illness  86 y.o. female admitted after  a fall at home. s/p  ORIF LEFT HIP FEMUR FRACTURE per Dr Alvan Dame on 09/03/19. PMH: L breast CA, HTN  Clinical Impression  Pt admitted with above diagnosis.  PT currently requiring +2 assist for bed mobility and transfers. Recommend SNF post acute. She is very independent at her baseline and anticipate she will progress well.   Pt currently with functional limitations due to the deficits listed below (see PT Problem List). Pt will benefit from skilled PT to increase their independence and safety with mobility to allow discharge to the venue listed below.       Follow Up Recommendations SNF    Equipment Recommendations  None recommended by PT    Recommendations for Other Services       Precautions / Restrictions Precautions Precautions: Fall Restrictions Weight Bearing Restrictions: Yes LLE Weight Bearing: Partial weight bearing LLE Partial Weight Bearing Percentage or Pounds: 50      Mobility  Bed Mobility Overal bed mobility: Needs Assistance Bed Mobility: Supine to Sit     Supine to sit: Mod assist;+2 for physical assistance;+2 for safety/equipment     General bed mobility comments: multi-modal cues to stay on task. assist with lateral scooting, LLE and trunk to upright. incr time needed  Transfers Overall transfer level: Needs assistance Equipment used: Rolling walker (2 wheeled) Transfers: Sit to/from [facility] Sit to Stand: Min assist;Mod assist;+2 physical assistance;+2 safety/equipment Stand pivot transfers: Max assist;Mod assist;+2 safety/equipment;+2 physical assistance       General transfer comment: cues for hand placement and safety, assist to rise and  to control descent. pt unable to wt shift to take steps or pivot to chair, environment manipulated to bring  chair to pt for safety/fall prevention  Ambulation/Gait                Stairs            Wheelchair Mobility    Modified Rankin (Stroke Patients Only)       Balance Overall balance assessment: Needs assistance Sitting-balance support: Bilateral upper extremity supported;Feet supported;No upper extremity supported Sitting balance-Leahy Scale: Fair       Standing balance-Leahy Scale: Poor Standing balance comment: reliant on UEs and external assist                             Pertinent Vitals/Pain Pain Assessment: 0-10 Pain Score: 6  Pain Location: left hip--with activity Pain Descriptors / Indicators: Grimacing;Sore Pain Intervention(s): Limited activity within patient's tolerance;Monitored during session;Premedicated before session;Repositioned;Ice applied    Home Living Family/patient expects to be discharged to:: Skilled nursing facility Living Arrangements: Children               Additional Comments: lives with dtr who currently works from home    Prior Function Level of Independence: Independent         Comments: cooks for family and ex husband, cleans, very active at her baseline     Hand Dominance        Extremity/Trunk Assessment   Upper Extremity Assessment Upper Extremity Assessment: Generalized weakness    Lower Extremity Assessment Lower Extremity Assessment: LLE deficits/detail LLE Deficits / Details: limited d/t pain, ankle grossly WFL, knee/hip 2 to 2+/5       Communication   Communication:  No difficulties  Cognition Arousal/Alertness: Awake/alert Behavior During Therapy: WFL for tasks assessed/performed   Area of Impairment: Attention;Following commands                   Current Attention Level: Focused   Following Commands: Follows one step commands consistently;Follows multi-step commands inconsistently       General Comments: pt is extremely talkative, tangential at times. requires  constant redirection to answer basic questions and follow basic commands; pt states "I talk all the time"      General Comments      Exercises     Assessment/Plan    PT Assessment Patient needs continued PT services  PT Problem List Decreased strength;Decreased activity tolerance;Decreased mobility;Decreased balance;Decreased knowledge of use of DME;Pain;Decreased range of motion;Decreased knowledge of precautions;Decreased safety awareness       PT Treatment Interventions DME instruction;Therapeutic exercise;Gait training;Functional mobility training;Therapeutic activities;Patient/family education;Balance training    PT Goals (Current goals can be found in the Care Plan section)  Acute Rehab PT Goals PT Goal Formulation: With patient Time For Goal Achievement: 09/18/19 Potential to Achieve Goals: Good    Frequency Min 3X/week   Barriers to discharge        Co-evaluation               AM-PAC PT "6 Clicks" Mobility  Outcome Measure Help needed turning from your back to your side while in a flat bed without using bedrails?: A Lot Help needed moving from lying on your back to sitting on the side of a flat bed without using bedrails?: A Lot Help needed moving to and from a bed to a chair (including a wheelchair)?: A Lot Help needed standing up from a chair using your arms (e.g., wheelchair or bedside chair)?: A Lot Help needed to walk in hospital room?: Total Help needed climbing 3-5 steps with a railing? : Total 6 Click Score: 10    End of Session   Activity Tolerance: Patient limited by pain;Patient limited by fatigue Patient left: in chair;with call bell/phone within reach;with chair alarm set Nurse Communication: Mobility status PT Visit Diagnosis: Difficulty in walking, not elsewhere classified (R26.2);Muscle weakness (generalized) (M62.81);History of falling (Z91.81)    Time: 6503-5465 PT Time Calculation (min) (ACUTE ONLY): 14 min   Charges:   PT  Evaluation $PT Eval Low Complexity: 1 Low          Demtrius Rounds, PT   Acute Rehab Dept [facility]): [phone]   09/04/2019   [facility] 09/04/2019, 11:14 AM

## 2019-09-04 NOTE — Care Management Important Message (Signed)
Important Message  Patient Details IM Letter given to Velva Harman RN Case Manager to present to the Patient Name: Carol Duarte MRN: [id number] Date of Birth: 25-Jun-1925   Medicare Important Message Given:  Yes     Kerin Salen 09/04/2019, 12:59 PM

## 2019-09-04 NOTE — Progress Notes (Addendum)
   Subjective: 1 Day Post-Op Procedure(s) (LRB): OPEN REDUCTION INTERNAL FIXATION (ORIF) LEFT HIP FEMUR FRACTURE (Left) Patient reports pain as mild.   Patient seen in rounds for Dr. Alvan Dame. Patient is well, and has had no acute complaints or problems other than mild discomfort int he left hip. No acute events overnight. Patient is resting comfortably in bed this morning. She states she slept well last night, and has had a good appetite without nausea/vomiting. Foley catheter in place, to be removed this AM. Denies CP, SHOB. She is hoping she will be able to discharge home with family when she leaves. We will start therapy today.   Objective: Vital signs in last 24 hours: Temp:  [97.5 F (36.4 C)-99 F (37.2 C)] 98.3 F (36.8 C) (01/06 0542) Pulse Rate:  [65-80] 68 (01/06 0542) Resp:  [14-24] 16 (01/06 0542) BP: (132-199)/(66-89) 143/77 (01/06 0542) SpO2:  [94 %-100 %] 97 % (01/06 0542)  Intake/Output from previous day:  Intake/Output Summary (Last 24 hours) at 09/04/2019 0956 Last data filed at 09/04/2019 0600 Gross per 24 hour  Intake 3320.36 ml  Output 2400 ml  Net 920.36 ml     Intake/Output this shift: No intake/output data recorded.  Labs: Recent Labs    09/01/19 1527 09/02/19 0322 09/04/19 0338  HGB 11.9* 11.7* 8.3*   Recent Labs    09/02/19 0322 09/04/19 0338  WBC 10.6* 10.5  RBC 3.98 2.79*  HCT 36.9 26.5*  PLT 190 188   Recent Labs    09/02/19 0322 09/04/19 0338  NA 137 134*  K 4.8 4.9  CL 102 102  CO2 23 24  BUN 36* 32*  CREATININE 1.21* 1.09*  GLUCOSE 154* 190*  CALCIUM 9.3 8.5*   No results for input(s): LABPT, INR in the last 72 hours.  Exam: General - Patient is Alert and Oriented Extremity - Neurologically intact Sensation intact distally Intact pulses distally Dorsiflexion/Plantar flexion intact Dressing - dressing C/D/I Motor Function - intact, moving foot and toes well on exam.   Past Medical History:  Diagnosis Date  . Cancer  ([place])    left breast  . History of kidney stones   . Hypercholesteremia   . Hypertension     Assessment/Plan: 1 Day Post-Op Procedure(s) (LRB): OPEN REDUCTION INTERNAL FIXATION (ORIF) LEFT HIP FEMUR FRACTURE (Left) Active Problems:   Hip fracture (HCC)  Estimated body mass index is 23.05 kg/m as calculated from the following:   Height as of this encounter: 5\' 1"  (1.549 m).   Weight as of this encounter: 55.3 kg. Advance diet Up with therapy  DVT Prophylaxis - Aspirin Partial weight bearing LLE at 50%. D/C O2 and pulse ox and try on room air.  Plan is to go Home vs SNF after hospital stay. She is doing well this morning. Plan for discharge when medically stable. Partial weight bearing LLE at 50% with use of rolling walker until follow up. Aquacell dressing to remain in place until follow up. She may shower with this on. Aspirin 325 mg BID x4 weeks for DVT prophylaxis. Patient will follow up in the office in 2 weeks.   Griffith Citron, PA-C Orthopedic Surgery [phone] 09/04/2019, 9:56 AM

## 2019-09-04 NOTE — Progress Notes (Signed)
PROGRESS NOTE    Carol Duarte  YYT:[id number] DOB: Jul 02, 1925 DOA: 09/01/2019 PCP: [name], MD   Brief Narrative:  Patient is a 86 year old female with history of hypertension, hyperlipidemia, breast cancer who presents to the emergency department with complaint of left hip pain after falling from a bathroom commode.  X-ray imaging done in the emergency department showed acute, comminuted, displaced left-sided intertrochanteric fracture.  Orthopedics consulted.  Underwent ORIF on 09/03/19.  Plan for discharge to skilled care facility as soon as bed is available.  She is hemodynamically stable for discharge.  Assessment & Plan:   Active Problems:   Hip fracture (HCC)   Left intertrochanteric femur fracture: X-ray as above.  Orthopedics following.  S/P ORIF. Continue pain management, supportive care.  Aspirin 325 twice daily for 4 weeks for DVT prophylaxis for now.  PT/OT recommended skilled nursing facility.  Pain well controlled. Patient  is usually physically active, ambulates with the help of walker.  History of left breast cancer: On trastuzumab.  Follows with her oncologist at [facility].  Status post mastectomy.She has a Chemo-Port on the right chest  Acute blood loss normocytic anemia: Secondary to surgery.  Hemoglobin dropped to 8.3.  Check CBC tomorrow morning.  Hypertension: Currently blood pressure well controlled.  Continue home medicines  CKD stage II: Currently kidney function at baseline.         DVT prophylaxis:Heparin [place] Code Status: Full Family Communication: Called daughter on phone on 09/04/19 for update Disposition Plan: Skilled nursing facility as soon as bed is available   Consultants: Orthopedics  Procedures: None  Antimicrobials:  Anti-infectives (From admission, onward)   Start     Dose/Rate Route Frequency Ordered Stop   09/03/19 1915  ceFAZolin (ANCEF) IVPB 2g/100 mL premix     2 g 200 mL/hr over 30 Minutes Intravenous Every 6 hours  09/03/19 1645 09/04/19 0104   09/03/19 0600  ceFAZolin (ANCEF) IVPB 2g/100 mL premix     2 g 200 mL/hr over 30 Minutes Intravenous On call to O.R. 09/02/19 1936 09/03/19 1315      Subjective: Patient seen and examined at the bedside this morning.  Hemodynamically stable.  Pain well controlled.  Not in any kind of distress.  Objective: Vitals:   09/03/19 2220 09/04/19 0150 09/04/19 0542 09/04/19 1012  BP: (!) 148/72 132/66 (!) 143/77 (!) 163/78  Pulse: 72 72 68 94  Resp: 16 16 16    Temp: 98.3 F (36.8 C) 98.4 F (36.9 C) 98.3 F (36.8 C) 98.2 F (36.8 C)  TempSrc: Oral Oral Oral Oral  SpO2: 100% 98% 97% 96%  Weight:      Height:        Intake/Output Summary (Last 24 hours) at 09/04/2019 1223 Last data filed at 09/04/2019 1030 Gross per 24 hour  Intake 3441.46 ml  Output 1100 ml  Net 2341.46 ml   Filed Weights   09/01/19 1321  Weight: 55.3 kg    Examination:  General exam: Pleasant elderly female HEENT:PERRL,Oral mucosa moist, Ear/Nose normal on gross exam Respiratory system: Bilateral equal air entry, normal vesicular breath sounds, no wheezes or crackles  Cardiovascular system: S1 & S2 heard, RRR. No JVD, murmurs, rubs, gallops or clicks. No pedal edema.  Chemo-Port on the right chest Gastrointestinal system: Abdomen is nondistended, soft and nontender. No organomegaly or masses felt. Normal bowel sounds heard. Central nervous system: Alert and oriented. No focal neurological deficits. Extremities: No edema, no clubbing ,no cyanosis, tenderness on the left hip, decreased range  of motion on the left lower extremity, clean surgical wound on the left hip. skin: No rashes, lesions or ulcers,no icterus ,no pallor      Data Reviewed: I have personally reviewed following labs and imaging studies  CBC: Recent Labs  Lab 09/01/19 1527 09/02/19 0322 09/04/19 0338  WBC 11.9* 10.6* 10.5  NEUTROABS 9.8* 8.5* 9.1*  HGB 11.9* 11.7* 8.3*  HCT 37.2 36.9 26.5*  MCV 91.6 92.7  95.0  PLT 195 190 244   Basic Metabolic Panel: Recent Labs  Lab 09/01/19 1527 09/02/19 0322 09/04/19 0338  NA 134* 137 134*  K 4.1 4.8 4.9  CL 101 102 102  CO2 21* 23 24  GLUCOSE 152* 154* 190*  BUN 32* 36* 32*  CREATININE 1.11* 1.21* 1.09*  CALCIUM 9.1 9.3 8.5*  MG  --  2.6*  --    GFR: Estimated Creatinine Clearance: 24.9 mL/min (A) (by C-G formula based on SCr of 1.09 mg/dL (H)). Liver Function Tests: Recent Labs  Lab 09/02/19 0322  AST 37  ALT 32  ALKPHOS 61  BILITOT 0.7  PROT 6.8  ALBUMIN 3.9   No results for input(s): LIPASE, AMYLASE in the last 168 hours. No results for input(s): AMMONIA in the last 168 hours. Coagulation Profile: No results for input(s): INR, PROTIME in the last 168 hours. Cardiac Enzymes: No results for input(s): CKTOTAL, CKMB, CKMBINDEX, TROPONINI in the last 168 hours. BNP (last 3 results) No results for input(s): PROBNP in the last 8760 hours. HbA1C: No results for input(s): HGBA1C in the last 72 hours. CBG: Recent Labs  Lab 09/01/19 2200 09/02/19 0544 09/02/19 0759  GLUCAP 184* 142* 126*   Lipid Profile: No results for input(s): CHOL, HDL, LDLCALC, TRIG, CHOLHDL, LDLDIRECT in the last 72 hours. Thyroid Function Tests: No results for input(s): TSH, T4TOTAL, FREET4, T3FREE, THYROIDAB in the last 72 hours. Anemia Panel: No results for input(s): VITAMINB12, FOLATE, FERRITIN, TIBC, IRON, RETICCTPCT in the last 72 hours. Sepsis Labs: No results for input(s): PROCALCITON, LATICACIDVEN in the last 168 hours.  Recent Results (from the past 240 hour(s))  SARS CORONAVIRUS 2 (TAT 6-24 HRS) Nasopharyngeal Nasopharyngeal Swab     Status: None   Collection Time: 09/01/19  4:18 PM   Specimen: Nasopharyngeal Swab  Result Value Ref Range Status   SARS Coronavirus 2 NEGATIVE NEGATIVE Final    Comment: (NOTE) SARS-CoV-2 target nucleic acids are NOT DETECTED. The SARS-CoV-2 RNA is generally detectable in upper and lower respiratory  specimens during the acute phase of infection. Negative results do not preclude SARS-CoV-2 infection, do not rule out co-infections with other pathogens, and should not be used as the sole basis for treatment or other patient management decisions. Negative results must be combined with clinical observations, patient history, and epidemiological information. The expected result is Negative. Fact Sheet for Patients: SugarRoll.be Fact Sheet for Healthcare Providers: https://www.woods-mathews.com/ This test is not yet approved or cleared by the [place] FDA and  has been authorized for detection and/or diagnosis of SARS-CoV-2 by FDA under an Emergency Use Authorization (EUA). This EUA will remain  in effect (meaning this test can be used) for the duration of the COVID-19 declaration under Section 56 4(b)(1) of the Act, 21 U.S.C. section 360bbb-3(b)(1), unless the authorization is terminated or revoked sooner. Performed at [facility], [place] [address] [place],  [address]   Respiratory Panel by RT PCR (Flu A&B, Covid) - Nasopharyngeal Swab     Status: None   Collection Time: 09/01/19  9:21 PM   Specimen: Nasopharyngeal Swab  Result Value Ref Range Status   SARS Coronavirus 2 by RT PCR NEGATIVE NEGATIVE Final    Comment: (NOTE) SARS-CoV-2 target nucleic acids are NOT DETECTED. The SARS-CoV-2 RNA is generally detectable in upper respiratoy specimens during the acute phase of infection. The lowest concentration of SARS-CoV-2 viral copies this assay can detect is 131 copies/mL. A negative result does not preclude SARS-Cov-2 infection and should not be used as the sole basis for treatment or other patient management decisions. A negative result may occur with  improper specimen collection/handling, submission of specimen other than nasopharyngeal swab, presence of viral mutation(s) within the areas targeted by this assay, and  inadequate number of viral copies (<131 copies/mL). A negative result must be combined with clinical observations, patient history, and epidemiological information. The expected result is Negative. Fact Sheet for Patients:  PinkCheek.be Fact Sheet for Healthcare Providers:  GravelBags.it This test is not yet ap proved or cleared by the [place] FDA and  has been authorized for detection and/or diagnosis of SARS-CoV-2 by FDA under an Emergency Use Authorization (EUA). This EUA will remain  in effect (meaning this test can be used) for the duration of the COVID-19 declaration under Section 564(b)(1) of the Act, 21 U.S.C. section 360bbb-3(b)(1), unless the authorization is terminated or revoked sooner.    Influenza A by PCR NEGATIVE NEGATIVE Final   Influenza B by PCR NEGATIVE NEGATIVE Final    Comment: (NOTE) The Xpert Xpress SARS-CoV-2/FLU/RSV assay is intended as an aid in  the diagnosis of influenza from Nasopharyngeal swab specimens and  should not be used as a sole basis for treatment. Nasal washings and  aspirates are unacceptable for Xpert Xpress SARS-CoV-2/FLU/RSV  testing. Fact Sheet for Patients: PinkCheek.be Fact Sheet for Healthcare Providers: GravelBags.it This test is not yet approved or cleared by the [place] FDA and  has been authorized for detection and/or diagnosis of SARS-CoV-2 by  FDA under an Emergency Use Authorization (EUA). This EUA will remain  in effect (meaning this test can be used) for the duration of the  Covid-19 declaration under Section 564(b)(1) of the Act, 21  U.S.C. section 360bbb-3(b)(1), unless the authorization is  terminated or revoked. Performed at [facility], [address], [place], [place] [address]   MRSA PCR Screening     Status: None   Collection Time: 09/02/19  3:12 AM   Specimen: Nasopharyngeal  Result  Value Ref Range Status   MRSA by PCR NEGATIVE NEGATIVE Final    Comment:        The GeneXpert MRSA Assay (FDA approved for NASAL specimens only), is one component of a comprehensive MRSA colonization surveillance program. It is not intended to diagnose MRSA infection nor to guide or monitor treatment for MRSA infections. Performed at [facility], [place] [address], [place], [place] [address]          Radiology Studies: DG C-Arm 1-60 Min-No Report  Result Date: 09/03/2019 Fluoroscopy was utilized by the requesting physician.  No radiographic interpretation.   DG HIP OPERATIVE UNILAT W OR W/O PELVIS LEFT  Result Date: 09/03/2019 CLINICAL DATA:  Intraoperative imaging for fixation of a left intertrochanteric fracture the patient suffered in a fall 09/01/2019. Initial encounter. EXAM: OPERATIVE LEFT HIP (WITH PELVIS IF PERFORMED) 5 VIEWS TECHNIQUE: Fluoroscopic spot image(s) were submitted for interpretation post-operatively. COMPARISON:  Plain films left hip 09/01/2019. FINDINGS: Hip screw and short intramedullary nail with a single distal screw are in  place for fixation of an intertrochanteric fracture. Position and alignment are markedly improved. No acute abnormality. IMPRESSION: Intraoperative imaging for fixation of a left intertrochanteric fracture. Electronically Signed   By: Inge Rise M.D.   On: 09/03/2019 14:27        Scheduled Meds: . amLODipine  10 mg Oral Daily  . aspirin EC  325 mg Oral BID  . atorvastatin  20 mg Oral q1800  . Chlorhexidine Gluconate Cloth  6 each Topical Daily  . docusate sodium  100 mg Oral BID  . ferrous sulfate  325 mg Oral Q breakfast  . labetalol  100 mg Oral Daily  . LORazepam  1 mg Oral Daily  . oxybutynin  5 mg Oral Daily  . polyethylene glycol  17 g Oral Daily  . senna-docusate  1 tablet Oral QHS   Continuous Infusions: . sodium chloride 50 mL/hr at 09/04/19 1000     LOS: 3 days    Time spent: 25  mins.More than 50% of that time was spent in counseling and/or coordination of care.      Shelly Coss, MD [facility] Pager [phone]  If 7PM-7AM, please contact night-coverage [email or web address] Password [email or web address] 09/04/2019, 12:23 PM

## 2019-09-05 LAB — CBC WITH DIFFERENTIAL/PLATELET
Abs Immature Granulocytes: 0.09 10*3/uL — ABNORMAL HIGH (ref 0.00–0.07)
Basophils Absolute: 0 10*3/uL (ref 0.0–0.1)
Basophils Relative: 0 %
Eosinophils Absolute: 0.2 10*3/uL (ref 0.0–0.5)
Eosinophils Relative: 2 %
HCT: 21 % — ABNORMAL LOW (ref 36.0–46.0)
Hemoglobin: 6.6 g/dL — CL (ref 12.0–15.0)
Immature Granulocytes: 1 %
Lymphocytes Relative: 10 %
Lymphs Abs: 0.8 10*3/uL (ref 0.7–4.0)
MCH: 30.3 pg (ref 26.0–34.0)
MCHC: 31.4 g/dL (ref 30.0–36.0)
MCV: 96.3 fL (ref 80.0–100.0)
Monocytes Absolute: 0.7 10*3/uL (ref 0.1–1.0)
Monocytes Relative: 9 %
Neutro Abs: 6.5 10*3/uL (ref 1.7–7.7)
Neutrophils Relative %: 78 %
Platelets: 153 10*3/uL (ref 150–400)
RBC: 2.18 MIL/uL — ABNORMAL LOW (ref 3.87–5.11)
RDW: 14.3 % (ref 11.5–15.5)
WBC: 8.3 10*3/uL (ref 4.0–10.5)
nRBC: 0 % (ref 0.0–0.2)

## 2019-09-05 LAB — PREPARE RBC (CROSSMATCH)

## 2019-09-05 MED ORDER — ASPIRIN 325 MG PO TBEC: 325.0000 mg | DELAYED_RELEASE_TABLET | Freq: Two times a day (BID) | ORAL | 0 refills | Status: AC

## 2019-09-05 MED ORDER — HYDROCODONE-ACETAMINOPHEN 5-325 MG PO TABS: 1.0000 | ORAL_TABLET | Freq: Four times a day (QID) | ORAL | 0 refills | Status: DC | PRN

## 2019-09-05 MED ORDER — METHOCARBAMOL 500 MG PO TABS: 500.0000 mg | ORAL_TABLET | Freq: Four times a day (QID) | ORAL | 0 refills | Status: DC | PRN

## 2019-09-05 MED ORDER — SODIUM CHLORIDE 0.9% IV SOLUTION: Freq: Once | INTRAVENOUS | Status: DC

## 2019-09-05 NOTE — Progress Notes (Signed)
OT Cancellation Note  Patient Details Name: Milly Goggins MRN: [id number] DOB: Apr 23, 1925   Cancelled Treatment:    Reason Eval/Treat Not Completed: Other (comment).  Pt is recommending SNF at this time. Will defer OT to that venue of care. If plan changes to home, we will see her. Thanks.  Anavey Coombes 09/05/2019, 1:13 PM  Karsten Ro, OTR/L Acute Rehabilitation Services 09/05/2019

## 2019-09-05 NOTE — Progress Notes (Signed)
     Subjective: 2 Days Post-Op Procedure(s) (LRB): OPEN REDUCTION INTERNAL FIXATION (ORIF) LEFT HIP FEMUR FRACTURE (Left)   Patient reports pain as mild, pain controlled medication.  No reported events throughout the night.  Patient feels that she is sleeping well.  States that she did not do great with physical therapy yesterday.  She states that she has been told that if she does well with therapy she may build to go home versus to a facility.  She states that she is looking forward to getting better.     Objective:   VITALS:   Vitals:   09/04/19 2053 09/05/19 0556  BP: (!) 124/57 (!) 166/64  Pulse: 66 62  Resp: 18 16  Temp: 97.7 F (36.5 C) (!) 97.5 F (36.4 C)  SpO2: 98% 98%    Dorsiflexion/Plantar flexion intact Incision: scant drainage No cellulitis present Compartment soft  LABS Recent Labs    09/04/19 0338 09/05/19 0415  HGB 8.3* 6.6*  HCT 26.5* 21.0*  WBC 10.5 8.3  PLT 188 153    Recent Labs    09/04/19 0338  NA 134*  K 4.9  BUN 32*  CREATININE 1.09*  GLUCOSE 190*     Assessment/Plan: 2 Days Post-Op Procedure(s) (LRB): OPEN REDUCTION INTERNAL FIXATION (ORIF) LEFT HIP FEMUR FRACTURE (Left) Up with therapy Discharge disposition to be determined Prescriptions have been printed out and signed, placed on the patient's chart, if she is to go to facility.   Ortho recommendations:  ASA 325 mg bid for 4 weeks for anticoagulation, unless other medically indicated.  Norco for pain management (Rx written).  MiraLax and Colace for constipation  Iron 325 mg tid for 2-3 weeks   PWB 50% on the left leg.  Dressing to remain in place until follow in clinic in 2 weeks.  Dressing is waterproof and may shower with it in place.  Follow up in 2 weeks at [facility]. Follow up with OLIN,Jayin Derousse D in 2 weeks.  Contact information:  [facility] [address], [address] [place]    PAC  09/05/2019, 8:02 AM

## 2019-09-05 NOTE — Progress Notes (Signed)
PROGRESS NOTE    Carol Duarte  XNA:[id number] DOB: [birth year] DOA: 09/01/2019 PCP: [name], MD   Brief Narrative:  Patient is a 86 year old female with history of hypertension, hyperlipidemia, breast cancer who presents to the emergency department with complaint of left hip pain after falling from a bathroom commode.  X-ray imaging done in the emergency department showed acute, comminuted, displaced left-sided intertrochanteric fracture.  Orthopedics consulted.  Underwent ORIF on 09/03/19.  Plan for discharge to skilled care facility as soon as bed is available.  She underwent transfusion with unit of PRBC today.  Assessment & Plan:   Active Problems:   Hip fracture (HCC)   Left intertrochanteric femur fracture: X-ray as above.  Orthopedics following.  S/P ORIF. Continue pain management, supportive care.  Aspirin 325 twice daily for 4 weeks for DVT prophylaxis for now.  PT/OT recommended skilled nursing facility.  Pain well controlled. Patient  is usually physically active, ambulates with the help of walker.  History of left breast cancer: On trastuzumab.  Follows with her oncologist at [facility].  Status post mastectomy.She has a Chemo-Port on the right chest  Acute blood loss normocytic anemia: Secondary to surgery.  Hemoglobin dropped to the range of 6 today.  Transfused a unit of PRBC.  Started on iron PO.  Hypertension: Currently blood pressure well controlled.  Continue home medicines  CKD stage II: Currently kidney function at baseline.         DVT prophylaxis:Aspirin Code Status: Full Family Communication: Called daughter on phone on 09/05/19 for update Disposition Plan: Skilled nursing facility as soon as bed is available   Consultants: Orthopedics  Procedures: None  Antimicrobials:  Anti-infectives (From admission, onward)   Start     Dose/Rate Route Frequency Ordered Stop   09/03/19 1915  ceFAZolin (ANCEF) IVPB 2g/100 mL premix     2 g 200 mL/hr over  30 Minutes Intravenous Every 6 hours 09/03/19 1645 09/04/19 0104   09/03/19 0600  ceFAZolin (ANCEF) IVPB 2g/100 mL premix     2 g 200 mL/hr over 30 Minutes Intravenous On call to O.R. 09/02/19 1936 09/03/19 1315      Subjective: Patient seen and examined at the bedside this morning.  Hemodynamically stable.  She was being transfused with a unit of PRBC for anemia from surgery.  Pain well controlled.  Denies any complaints.  Objective: Vitals:   09/05/19 0556 09/05/19 0833 09/05/19 0900 09/05/19 1126  BP: (!) 166/64 (!) 126/50 (!) 135/51 (!) 128/57  Pulse: 62 70 70 68  Resp: 16 18 18 16   Temp: (!) 97.5 F (36.4 C) 97.8 F (36.6 C) 97.6 F (36.4 C) 98.4 F (36.9 C)  TempSrc: Oral Oral Oral Oral  SpO2: 98% 97% 97% 96%  Weight:      Height:        Intake/Output Summary (Last 24 hours) at 09/05/2019 1426 Last data filed at 09/05/2019 1144 Gross per 24 hour  Intake 1400 ml  Output 1000 ml  Net 400 ml   Filed Weights   09/01/19 1321  Weight: 55.3 kg    Examination:  General exam: Pleasant elderly female, deconditioned HEENT:PERRL,, Ear/Nose normal on gross exam Respiratory system: Bilateral equal air entry, normal vesicular breath sounds, no wheezes or crackles  Cardiovascular system: S1 & S2 heard, RRR. No JVD, murmurs, rubs, gallops or clicks. No pedal edema.  Chemo-Port on the right chest Gastrointestinal system: Abdomen is nondistended, soft and nontender. No organomegaly or masses felt. Normal bowel sounds heard. Central  nervous system: Alert and oriented. No focal neurological deficits. Extremities: No edema, no clubbing ,no cyanosis, tenderness on the left hip, decreased range of motion on the left lower extremity, clean surgical wound on the left hip. skin: No rashes, lesions or ulcers,no icterus ,no pallor      Data Reviewed: I have personally reviewed following labs and imaging studies  CBC: Recent Labs  Lab 09/01/19 1527 09/02/19 0322 09/04/19 0338  09/05/19 0415  WBC 11.9* 10.6* 10.5 8.3  NEUTROABS 9.8* 8.5* 9.1* 6.5  HGB 11.9* 11.7* 8.3* 6.6*  HCT 37.2 36.9 26.5* 21.0*  MCV 91.6 92.7 95.0 96.3  PLT 195 190 188 811   Basic Metabolic Panel: Recent Labs  Lab 09/01/19 1527 09/02/19 0322 09/04/19 0338  NA 134* 137 134*  K 4.1 4.8 4.9  CL 101 102 102  CO2 21* 23 24  GLUCOSE 152* 154* 190*  BUN 32* 36* 32*  CREATININE 1.11* 1.21* 1.09*  CALCIUM 9.1 9.3 8.5*  MG  --  2.6*  --    GFR: Estimated Creatinine Clearance: 24.9 mL/min (A) (by C-G formula based on SCr of 1.09 mg/dL (H)). Liver Function Tests: Recent Labs  Lab 09/02/19 0322  AST 37  ALT 32  ALKPHOS 61  BILITOT 0.7  PROT 6.8  ALBUMIN 3.9   No results for input(s): LIPASE, AMYLASE in the last 168 hours. No results for input(s): AMMONIA in the last 168 hours. Coagulation Profile: No results for input(s): INR, PROTIME in the last 168 hours. Cardiac Enzymes: No results for input(s): CKTOTAL, CKMB, CKMBINDEX, TROPONINI in the last 168 hours. BNP (last 3 results) No results for input(s): PROBNP in the last 8760 hours. HbA1C: No results for input(s): HGBA1C in the last 72 hours. CBG: Recent Labs  Lab 09/01/19 2200 09/02/19 0544 09/02/19 0759  GLUCAP 184* 142* 126*   Lipid Profile: No results for input(s): CHOL, HDL, LDLCALC, TRIG, CHOLHDL, LDLDIRECT in the last 72 hours. Thyroid Function Tests: No results for input(s): TSH, T4TOTAL, FREET4, T3FREE, THYROIDAB in the last 72 hours. Anemia Panel: No results for input(s): VITAMINB12, FOLATE, FERRITIN, TIBC, IRON, RETICCTPCT in the last 72 hours. Sepsis Labs: No results for input(s): PROCALCITON, LATICACIDVEN in the last 168 hours.  Recent Results (from the past 240 hour(s))  SARS CORONAVIRUS 2 (TAT 6-24 HRS) Nasopharyngeal Nasopharyngeal Swab     Status: None   Collection Time: 09/01/19  4:18 PM   Specimen: Nasopharyngeal Swab  Result Value Ref Range Status   SARS Coronavirus 2 NEGATIVE NEGATIVE Final     Comment: (NOTE) SARS-CoV-2 target nucleic acids are NOT DETECTED. The SARS-CoV-2 RNA is generally detectable in upper and lower respiratory specimens during the acute phase of infection. Negative results do not preclude SARS-CoV-2 infection, do not rule out co-infections with other pathogens, and should not be used as the sole basis for treatment or other patient management decisions. Negative results must be combined with clinical observations, patient history, and epidemiological information. The expected result is Negative. Fact Sheet for Patients: SugarRoll.be Fact Sheet for Healthcare Providers: https://www.woods-mathews.com/ This test is not yet approved or cleared by the [place] FDA and  has been authorized for detection and/or diagnosis of SARS-CoV-2 by FDA under an Emergency Use Authorization (EUA). This EUA will remain  in effect (meaning this test can be used) for the duration of the COVID-19 declaration under Section 56 4(b)(1) of the Act, 21 U.S.C. section 360bbb-3(b)(1), unless the authorization is terminated or revoked sooner. Performed at [facility],  [address] [place], [place] [address]   Respiratory Panel by RT PCR (Flu A&B, Covid) - Nasopharyngeal Swab     Status: None   Collection Time: 09/01/19  9:21 PM   Specimen: Nasopharyngeal Swab  Result Value Ref Range Status   SARS Coronavirus 2 by RT PCR NEGATIVE NEGATIVE Final    Comment: (NOTE) SARS-CoV-2 target nucleic acids are NOT DETECTED. The SARS-CoV-2 RNA is generally detectable in upper respiratoy specimens during the acute phase of infection. The lowest concentration of SARS-CoV-2 viral copies this assay can detect is 131 copies/mL. A negative result does not preclude SARS-Cov-2 infection and should not be used as the sole basis for treatment or other patient management decisions. A negative result may occur with  improper specimen  collection/handling, submission of specimen other than nasopharyngeal swab, presence of viral mutation(s) within the areas targeted by this assay, and inadequate number of viral copies (<131 copies/mL). A negative result must be combined with clinical observations, patient history, and epidemiological information. The expected result is Negative. Fact Sheet for Patients:  PinkCheek.be Fact Sheet for Healthcare Providers:  GravelBags.it This test is not yet ap proved or cleared by the [place] FDA and  has been authorized for detection and/or diagnosis of SARS-CoV-2 by FDA under an Emergency Use Authorization (EUA). This EUA will remain  in effect (meaning this test can be used) for the duration of the COVID-19 declaration under Section 564(b)(1) of the Act, 21 U.S.C. section 360bbb-3(b)(1), unless the authorization is terminated or revoked sooner.    Influenza A by PCR NEGATIVE NEGATIVE Final   Influenza B by PCR NEGATIVE NEGATIVE Final    Comment: (NOTE) The Xpert Xpress SARS-CoV-2/FLU/RSV assay is intended as an aid in  the diagnosis of influenza from Nasopharyngeal swab specimens and  should not be used as a sole basis for treatment. Nasal washings and  aspirates are unacceptable for Xpert Xpress SARS-CoV-2/FLU/RSV  testing. Fact Sheet for Patients: PinkCheek.be Fact Sheet for Healthcare Providers: GravelBags.it This test is not yet approved or cleared by the [place] FDA and  has been authorized for detection and/or diagnosis of SARS-CoV-2 by  FDA under an Emergency Use Authorization (EUA). This EUA will remain  in effect (meaning this test can be used) for the duration of the  Covid-19 declaration under Section 564(b)(1) of the Act, 21  U.S.C. section 360bbb-3(b)(1), unless the authorization is  terminated or revoked. Performed at [facility],  [address], [place], [place] [address]   MRSA PCR Screening     Status: None   Collection Time: 09/02/19  3:12 AM   Specimen: Nasopharyngeal  Result Value Ref Range Status   MRSA by PCR NEGATIVE NEGATIVE Final    Comment:        The GeneXpert MRSA Assay (FDA approved for NASAL specimens only), is one component of a comprehensive MRSA colonization surveillance program. It is not intended to diagnose MRSA infection nor to guide or monitor treatment for MRSA infections. Performed at [facility], [place] [address] [place], [place] [address]          Radiology Studies: No results found.      Scheduled Meds: . sodium chloride   Intravenous Once  . amLODipine  10 mg Oral Daily  . aspirin EC  325 mg Oral BID  . atorvastatin  20 mg Oral q1800  . Chlorhexidine Gluconate Cloth  6 each Topical Daily  . docusate sodium  100 mg Oral BID  . ferrous  sulfate  325 mg Oral Q breakfast  . labetalol  100 mg Oral Daily  . LORazepam  1 mg Oral Daily  . oxybutynin  5 mg Oral Daily  . polyethylene glycol  17 g Oral Daily  . senna-docusate  1 tablet Oral QHS   Continuous Infusions:    LOS: 4 days    Time spent: 25 mins.More than 50% of that time was spent in counseling and/or coordination of care.      Shelly Coss, MD [facility] Pager [phone]  If 7PM-7AM, please contact night-coverage [email or web address] Password [email or web address] 09/05/2019, 2:26 PM

## 2019-09-05 NOTE — Progress Notes (Signed)
CRITICAL VALUE ALERT  Critical Value:  Hgb 6.6  Date & Time Notied:  1/7 0505  Provider Notified: Baltazar Najjar  Orders Received/Actions taken: See Chart

## 2019-09-06 LAB — SARS CORONAVIRUS 2 (TAT 6-24 HRS): SARS Coronavirus 2: NEGATIVE

## 2019-09-06 LAB — CBC WITH DIFFERENTIAL/PLATELET
Abs Immature Granulocytes: 0.22 10*3/uL — ABNORMAL HIGH (ref 0.00–0.07)
Abs Immature Granulocytes: 0.23 10*3/uL — ABNORMAL HIGH (ref 0.00–0.07)
Basophils Absolute: 0 10*3/uL (ref 0.0–0.1)
Basophils Absolute: 0 10*3/uL (ref 0.0–0.1)
Basophils Relative: 0 %
Basophils Relative: 0 %
Eosinophils Absolute: 0.4 10*3/uL (ref 0.0–0.5)
Eosinophils Absolute: 0.4 10*3/uL (ref 0.0–0.5)
Eosinophils Relative: 4 %
Eosinophils Relative: 4 %
HCT: 26.4 % — ABNORMAL LOW (ref 36.0–46.0)
HCT: 29.4 % — ABNORMAL LOW (ref 36.0–46.0)
Hemoglobin: 8.5 g/dL — ABNORMAL LOW (ref 12.0–15.0)
Hemoglobin: 9 g/dL — ABNORMAL LOW (ref 12.0–15.0)
Immature Granulocytes: 3 %
Immature Granulocytes: 2 %
Lymphocytes Relative: 12 %
Lymphocytes Relative: 13 %
Lymphs Abs: 1.1 10*3/uL (ref 0.7–4.0)
Lymphs Abs: 1.2 10*3/uL (ref 0.7–4.0)
MCH: 30.8 pg (ref 26.0–34.0)
MCH: 29.3 pg (ref 26.0–34.0)
MCHC: 32.2 g/dL (ref 30.0–36.0)
MCHC: 30.6 g/dL (ref 30.0–36.0)
MCV: 95.7 fL (ref 80.0–100.0)
MCV: 95.8 fL (ref 80.0–100.0)
Monocytes Absolute: 0.8 10*3/uL (ref 0.1–1.0)
Monocytes Absolute: 0.8 10*3/uL (ref 0.1–1.0)
Monocytes Relative: 10 %
Monocytes Relative: 8 %
Neutro Abs: 6.3 10*3/uL (ref 1.7–7.7)
Neutro Abs: 7.1 10*3/uL (ref 1.7–7.7)
Neutrophils Relative %: 71 %
Neutrophils Relative %: 73 %
Platelets: 161 10*3/uL (ref 150–400)
Platelets: 186 10*3/uL (ref 150–400)
RBC: 2.76 MIL/uL — ABNORMAL LOW (ref 3.87–5.11)
RBC: 3.07 MIL/uL — ABNORMAL LOW (ref 3.87–5.11)
RDW: 15.2 % (ref 11.5–15.5)
RDW: 15.1 % (ref 11.5–15.5)
WBC: 8.9 10*3/uL (ref 4.0–10.5)
WBC: 9.7 10*3/uL (ref 4.0–10.5)
nRBC: 0.7 % — ABNORMAL HIGH (ref 0.0–0.2)
nRBC: 0.6 % — ABNORMAL HIGH (ref 0.0–0.2)

## 2019-09-06 LAB — TYPE AND SCREEN
ABO/RH(D): A POS
Antibody Screen: NEGATIVE
Unit division: 0

## 2019-09-06 LAB — BPAM RBC
Blood Product Expiration Date: 202101232359
ISSUE DATE / TIME: 202101070839
Unit Type and Rh: 600

## 2019-09-06 NOTE — Progress Notes (Signed)
PROGRESS NOTE    Carol Duarte  DXA:[id number] DOB: 04/10/1925 DOA: 09/01/2019 PCP: [name], MD   Brief Narrative: Patient is a 86 year old female with history of hypertension, hyperlipidemia, breast cancer who presents to the emergency department with complaint of left hip pain after falling from a bathroom commode.  X-ray imaging done in the emergency department showed acute, comminuted, displaced left-sided intertrochanteric fracture.  Orthopedics consulted.  Underwent ORIF on 09/03/19.  Plan for discharge to skilled care facility as soon as bed is available.  She underwent transfusion with unit of PRBC yesterday. Her Hb stayed stable.  Assessment & Plan:   Active Problems:   Hip fracture (HCC)  Left intertrochanteric femur fracture: X-ray as above.  Orthopedics following.  S/P ORIF. Continue pain management, supportive care.  Aspirin 325 twice daily for 4 weeks for DVT prophylaxis for now.  PT/OT recommended skilled nursing facility.  Pain well controlled. Patient  is usually physically active, ambulates with the help of walker.  History of left breast cancer: On trastuzumab.  Follows with her oncologist at [facility].  Status post mastectomy.She has a Chemo-Port on the right chest.  Acute blood loss normocytic anemia: Secondary to surgery.  Hemoglobin dropped to the range of 6 today.  Transfused a unit of PRBC.  Started on iron PO.  Hypertension: Currently blood pressure well controlled.  Continue home medicines  CKD stage II: Currently kidney function at baseline.  DVT prophylaxis: Aspirin Code Status: Full code Family Communication: Discussed with patient in detail. Disposition Plan: Anticipated discharge to skilled nursing facility tomorrow.  Consultants:   Orthopedic  Procedures: Open reduction internal fixation Antimicrobials: Anti-infectives (From admission, onward)   Start     Dose/Rate Route Frequency Ordered Stop   09/03/19 1915  ceFAZolin (ANCEF) IVPB  2g/100 mL premix     2 g 200 mL/hr over 30 Minutes Intravenous Every 6 hours 09/03/19 1645 09/04/19 0104   09/03/19 0600  ceFAZolin (ANCEF) IVPB 2g/100 mL premix     2 g 200 mL/hr over 30 Minutes Intravenous On call to O.R. 09/02/19 1936 09/03/19 1315      Subjective: Patient was seen and examined at bedside,  she was complaining about severe pain,  states when she comes out of the bed, had unbearable pain.  She has not participated in physical therapy.  Objective: Vitals:   09/05/19 0900 09/05/19 1126 09/05/19 2001 09/06/19 0455  BP: (!) 135/51 (!) 128/57 (!) 122/54 (!) 135/59  Pulse: 70 68 65 62  Resp: 18 16 20 18   Temp: 97.6 F (36.4 C) 98.4 F (36.9 C) 98.3 F (36.8 C) 98.5 F (36.9 C)  TempSrc: Oral Oral Oral Oral  SpO2: 97% 96% 98% 98%  Weight:      Height:        Intake/Output Summary (Last 24 hours) at 09/06/2019 1353 Last data filed at 09/06/2019 1337 Gross per 24 hour  Intake 960 ml  Output 1745 ml  Net -785 ml   Filed Weights   09/01/19 1321  Weight: 55.3 kg    Examination:  General exam: Pleasant, elderly female, deconditioned Respiratory system: Clear to auscultation. Respiratory effort normal. Cardiovascular system: S1 & S2 heard, RRR. No JVD, murmurs, rubs, gallops or clicks. No pedal edema. Gastrointestinal system: Abdomen is nondistended, soft and nontender. No organomegaly or masses felt. Normal bowel sounds heard. Central nervous system: Alert and oriented. No focal neurological deficits. Extremities: Tenderness to the left hip, decreased range of motion at left lower extremity,  clean surgical  wound on the left hip. Skin: No rashes, lesions or ulcers Psychiatry: Judgement and insight appear normal. Mood & affect appropriate.    Data Reviewed: I have personally reviewed following labs and imaging studies  CBC: Recent Labs  Lab 09/02/19 0322 09/04/19 0338 09/05/19 0415 09/06/19 0212 09/06/19 0923  WBC 10.6* 10.5 8.3 8.9 9.7  NEUTROABS 8.5*  9.1* 6.5 6.3 7.1  HGB 11.7* 8.3* 6.6* 8.5* 9.0*  HCT 36.9 26.5* 21.0* 26.4* 29.4*  MCV 92.7 95.0 96.3 95.7 95.8  PLT 190 188 153 161 161   Basic Metabolic Panel: Recent Labs  Lab 09/01/19 1527 09/02/19 0322 09/04/19 0338  NA 134* 137 134*  K 4.1 4.8 4.9  CL 101 102 102  CO2 21* 23 24  GLUCOSE 152* 154* 190*  BUN 32* 36* 32*  CREATININE 1.11* 1.21* 1.09*  CALCIUM 9.1 9.3 8.5*  MG  --  2.6*  --    GFR: Estimated Creatinine Clearance: 24.9 mL/min (A) (by C-G formula based on SCr of 1.09 mg/dL (H)). Liver Function Tests: Recent Labs  Lab 09/02/19 0322  AST 37  ALT 32  ALKPHOS 61  BILITOT 0.7  PROT 6.8  ALBUMIN 3.9   No results for input(s): LIPASE, AMYLASE in the last 168 hours. No results for input(s): AMMONIA in the last 168 hours. Coagulation Profile: No results for input(s): INR, PROTIME in the last 168 hours. Cardiac Enzymes: No results for input(s): CKTOTAL, CKMB, CKMBINDEX, TROPONINI in the last 168 hours. BNP (last 3 results) No results for input(s): PROBNP in the last 8760 hours. HbA1C: No results for input(s): HGBA1C in the last 72 hours. CBG: Recent Labs  Lab 09/01/19 2200 09/02/19 0544 09/02/19 0759  GLUCAP 184* 142* 126*   Lipid Profile: No results for input(s): CHOL, HDL, LDLCALC, TRIG, CHOLHDL, LDLDIRECT in the last 72 hours. Thyroid Function Tests: No results for input(s): TSH, T4TOTAL, FREET4, T3FREE, THYROIDAB in the last 72 hours. Anemia Panel: No results for input(s): VITAMINB12, FOLATE, FERRITIN, TIBC, IRON, RETICCTPCT in the last 72 hours. Sepsis Labs: No results for input(s): PROCALCITON, LATICACIDVEN in the last 168 hours.  Recent Results (from the past 240 hour(s))  SARS CORONAVIRUS 2 (TAT 6-24 HRS) Nasopharyngeal Nasopharyngeal Swab     Status: None   Collection Time: 09/01/19  4:18 PM   Specimen: Nasopharyngeal Swab  Result Value Ref Range Status   SARS Coronavirus 2 NEGATIVE NEGATIVE Final    Comment: (NOTE) SARS-CoV-2  target nucleic acids are NOT DETECTED. The SARS-CoV-2 RNA is generally detectable in upper and lower respiratory specimens during the acute phase of infection. Negative results do not preclude SARS-CoV-2 infection, do not rule out co-infections with other pathogens, and should not be used as the sole basis for treatment or other patient management decisions. Negative results must be combined with clinical observations, patient history, and epidemiological information. The expected result is Negative. Fact Sheet for Patients: SugarRoll.be Fact Sheet for Healthcare Providers: https://www.woods-mathews.com/ This test is not yet approved or cleared by the [place] FDA and  has been authorized for detection and/or diagnosis of SARS-CoV-2 by FDA under an Emergency Use Authorization (EUA). This EUA will remain  in effect (meaning this test can be used) for the duration of the COVID-19 declaration under Section 56 4(b)(1) of the Act, 21 U.S.C. section 360bbb-3(b)(1), unless the authorization is terminated or revoked sooner. Performed at [facility], [place] [address] [place],  [address]   Respiratory Panel by RT PCR (Flu A&B, Covid) - Nasopharyngeal  Swab     Status: None   Collection Time: 09/01/19  9:21 PM   Specimen: Nasopharyngeal Swab  Result Value Ref Range Status   SARS Coronavirus 2 by RT PCR NEGATIVE NEGATIVE Final    Comment: (NOTE) SARS-CoV-2 target nucleic acids are NOT DETECTED. The SARS-CoV-2 RNA is generally detectable in upper respiratoy specimens during the acute phase of infection. The lowest concentration of SARS-CoV-2 viral copies this assay can detect is 131 copies/mL. A negative result does not preclude SARS-Cov-2 infection and should not be used as the sole basis for treatment or other patient management decisions. A negative result may occur with  improper specimen collection/handling, submission of specimen  other than nasopharyngeal swab, presence of viral mutation(s) within the areas targeted by this assay, and inadequate number of viral copies (<131 copies/mL). A negative result must be combined with clinical observations, patient history, and epidemiological information. The expected result is Negative. Fact Sheet for Patients:  PinkCheek.be Fact Sheet for Healthcare Providers:  GravelBags.it This test is not yet ap proved or cleared by the [place] FDA and  has been authorized for detection and/or diagnosis of SARS-CoV-2 by FDA under an Emergency Use Authorization (EUA). This EUA will remain  in effect (meaning this test can be used) for the duration of the COVID-19 declaration under Section 564(b)(1) of the Act, 21 U.S.C. section 360bbb-3(b)(1), unless the authorization is terminated or revoked sooner.    Influenza A by PCR NEGATIVE NEGATIVE Final   Influenza B by PCR NEGATIVE NEGATIVE Final    Comment: (NOTE) The Xpert Xpress SARS-CoV-2/FLU/RSV assay is intended as an aid in  the diagnosis of influenza from Nasopharyngeal swab specimens and  should not be used as a sole basis for treatment. Nasal washings and  aspirates are unacceptable for Xpert Xpress SARS-CoV-2/FLU/RSV  testing. Fact Sheet for Patients: PinkCheek.be Fact Sheet for Healthcare Providers: GravelBags.it This test is not yet approved or cleared by the [place] FDA and  has been authorized for detection and/or diagnosis of SARS-CoV-2 by  FDA under an Emergency Use Authorization (EUA). This EUA will remain  in effect (meaning this test can be used) for the duration of the  Covid-19 declaration under Section 564(b)(1) of the Act, 21  U.S.C. section 360bbb-3(b)(1), unless the authorization is  terminated or revoked. Performed at [facility], [address], [place], [place] [address]   MRSA  PCR Screening     Status: None   Collection Time: 09/02/19  3:12 AM   Specimen: Nasopharyngeal  Result Value Ref Range Status   MRSA by PCR NEGATIVE NEGATIVE Final    Comment:        The GeneXpert MRSA Assay (FDA approved for NASAL specimens only), is one component of a comprehensive MRSA colonization surveillance program. It is not intended to diagnose MRSA infection nor to guide or monitor treatment for MRSA infections. Performed at [facility], [place] [address] [place], [place] [address]      Radiology Studies: No results found.   Scheduled Meds: . sodium chloride   Intravenous Once  . amLODipine  10 mg Oral Daily  . aspirin EC  325 mg Oral BID  . atorvastatin  20 mg Oral q1800  . Chlorhexidine Gluconate Cloth  6 each Topical Daily  . docusate sodium  100 mg Oral BID  . ferrous sulfate  325 mg Oral Q breakfast  . labetalol  100 mg Oral Daily  . LORazepam  1 mg Oral Daily  . oxybutynin  5 mg Oral Daily  . polyethylene glycol  17 g Oral Daily  . senna-docusate  1 tablet Oral QHS   Continuous Infusions:   LOS: 5 days    Time spent: 25 mins.    Shawna Clamp, MD [facility]   If 7PM-7AM, please contact night-coverage

## 2019-09-06 NOTE — NC FL2 (Signed)
[place] LEVEL OF CARE SCREENING TOOL     IDENTIFICATION  Patient Name: Carol Duarte Birthdate: 10/24/1924 Sex: female Admission Date (Current Location): 09/01/2019  [facility] and [place] Number:  Herbalist and Address:         Provider Number: [id number]  Attending Physician Name and Address:  Shawna Clamp, MD  Relative Name and Phone Number:       Current Level of Care: Hospital Recommended Level of Care: [place] Prior Approval Number:    Date Approved/Denied:   PASRR Number: [id number]  Discharge Plan: SNF    Current Diagnoses: Patient Active Problem List   Diagnosis Date Noted  . Hip fracture ([place]) 09/01/2019  . Genetic testing 09/20/2018  . Goals of care, counseling/discussion 08/06/2018  . S/P left mastectomy 05/28/2018  . Malignant neoplasm of lower-outer quadrant of left female breast ([place])   . Invasive ductal carcinoma of left breast ([place]) 03/30/2017  . Unstable angina ([place]) 04/03/2016  . HTN (hypertension) 04/03/2016  . HLD (hyperlipidemia) 04/03/2016  . Bursitis, shoulder 07/10/2014    Orientation RESPIRATION BLADDER Height & Weight     Self, Time, Situation, Place  Normal Continent Weight: 55.3 kg Height:  5\' 1"  (154.9 cm)  BEHAVIORAL SYMPTOMS/MOOD NEUROLOGICAL BOWEL NUTRITION STATUS      Continent Diet(regular)  AMBULATORY STATUS COMMUNICATION OF NEEDS Skin   Extensive Assist Verbally Normal                       Personal Care Assistance Level of Assistance  Bathing, Dressing, Feeding Bathing Assistance: Limited assistance Feeding assistance: Limited assistance Dressing Assistance: Limited assistance     Functional Limitations Info  Sight, Hearing, Speech Sight Info: Adequate Hearing Info: Adequate Speech Info: Adequate    SPECIAL CARE FACTORS FREQUENCY  PT (By licensed PT)     PT Frequency: 5x weekly              Contractures Contractures Info: Not present    Additional  Factors Info  Code Status Code Status Info: full             Current Medications (09/06/2019):  This is the current hospital active medication list Current Facility-Administered Medications  Medication Dose Route Frequency Provider Last Rate Last Admin  . 0.9 %  sodium chloride infusion (Manually program via Guardrails IV Fluids)   Intravenous Once Kirby-Graham, Karsten Fells, NP      . acetaminophen (TYLENOL) suppository 650 mg  650 mg Rectal Q6H PRN Danae Orleans, PA-C      . acetaminophen (TYLENOL) tablet 325-650 mg  325-650 mg Oral Q6H PRN Babish, Matthew, PA-C      . amLODipine (NORVASC) tablet 10 mg  10 mg Oral Daily Danae Orleans, PA-C   10 mg at 09/04/19 1029  . aspirin EC tablet 325 mg  325 mg Oral BID Danae Orleans, PA-C   325 mg at 09/06/19 6568  . atorvastatin (LIPITOR) tablet 20 mg  20 mg Oral q1800 Danae Orleans, PA-C   20 mg at 09/05/19 1726  . Chlorhexidine Gluconate Cloth 2 % PADS 6 each  6 each Topical Daily Danae Orleans, PA-C   6 each at 09/05/19 0901  . docusate sodium (COLACE) capsule 100 mg  100 mg Oral BID Danae Orleans, PA-C   100 mg at 09/05/19 2158  . ferrous sulfate tablet 325 mg  325 mg Oral Q breakfast Danae Orleans, PA-C   325 mg at 09/06/19 1275  . hydrALAZINE (  APRESOLINE) injection 10 mg  10 mg Intravenous Q6H PRN Danae Orleans, PA-C      . HYDROcodone-acetaminophen (NORCO/VICODIN) 5-325 MG per tablet 1-2 tablet  1-2 tablet Oral Q4H PRN Danae Orleans, PA-C   1 tablet at 09/04/19 1028  . labetalol (NORMODYNE) tablet 100 mg  100 mg Oral Daily Danae Orleans, PA-C   100 mg at 09/04/19 1029  . LORazepam (ATIVAN) tablet 1 mg  1 mg Oral Daily Danae Orleans, PA-C   1 mg at 09/05/19 0850  . menthol-cetylpyridinium (CEPACOL) lozenge 3 mg  1 lozenge Oral PRN Danae Orleans, PA-C       Or  . phenol (CHLORASEPTIC) mouth spray 1 spray  1 spray Mouth/Throat PRN Babish, Matthew, PA-C      . methocarbamol (ROBAXIN) tablet 500 mg  500 mg Oral Q6H PRN Babish,  Matthew, PA-C      . metoCLOPramide (REGLAN) tablet 5-10 mg  5-10 mg Oral Q8H PRN Danae Orleans, PA-C       Or  . metoCLOPramide (REGLAN) injection 5-10 mg  5-10 mg Intravenous Q8H PRN Danae Orleans, PA-C      . morphine 4 MG/ML injection 4 mg  4 mg Intravenous Q3H PRN Danae Orleans, PA-C   4 mg at 09/01/19 2350  . ondansetron (ZOFRAN) tablet 4 mg  4 mg Oral Q6H PRN Danae Orleans, PA-C       Or  . ondansetron [facility]) injection 4 mg  4 mg Intravenous Q6H PRN Danae Orleans, PA-C   4 mg at 09/03/19 1300  . oxybutynin (DITROPAN) tablet 5 mg  5 mg Oral Daily Danae Orleans, PA-C   5 mg at 09/05/19 0849  . polyethylene glycol (MIRALAX / GLYCOLAX) packet 17 g  17 g Oral Daily Shelly Coss, MD   17 g at 09/05/19 0849  . senna-docusate (Senokot-S) tablet 1 tablet  1 tablet Oral QHS Shelly Coss, MD   1 tablet at 09/05/19 2158  . sodium chloride flush (NS) 0.9 % injection 10-40 mL  10-40 mL Intracatheter PRN Babish, Matthew, PA-C      . zolpidem (AMBIEN) tablet 5 mg  5 mg Oral QHS PRN Danae Orleans, PA-C         Discharge Medications: Please see discharge summary for a list of discharge medications.  Relevant Imaging Results:  Relevant Lab Results:   Additional Information [id number]  Leeroy Cha, RN

## 2019-09-06 NOTE — TOC Progression Note (Signed)
Transition of Care [facility]) - Progression Note    Patient Details  Name: Carol Duarte MRN: [id number] Date of Birth: 1924/10/14  [facility]) CM/SW Contact  Leeroy Cha, RN Phone Number: 09/06/2019, 10:58 AM  Clinical Narrative:    tct-DR. [facility] oncology patient can forego having infusion on [id number].  Can do vitrual call while in rehab to see how she is progressing.  Jolene Schimke notified.  paptient can not be transferred today due to low hgb.  But does have bed for Saturday Jan.9th.  Attending notified and will need new covid.   Expected Discharge Plan: [place] Barriers to Discharge: No SNF bed  Expected Discharge Plan and Services Expected Discharge Plan: [place]   Discharge Planning Services: CM Consult Post Acute Care Choice: [place] Living arrangements for the past 2 months: Single Family Home                                       Social Determinants of Health (SDOH) Interventions    Readmission Risk Interventions No flowsheet data found.

## 2019-09-06 NOTE — Progress Notes (Signed)
Physical Therapy Treatment Patient Details Name: Carol Duarte MRN: [id number] DOB: 1924-11-15 Today's Date: 09/06/2019    History of Present Illness 86 y.o. female admitted after  a fall at home. s/p  ORIF LEFT HIP FEMUR FRACTURE per Dr Alvan Dame on 09/03/19. PMH: L breast CA, HTN    PT Comments    Pt progressing toward PT goals. Continue to recommend SNF post acute    Follow Up Recommendations  SNF     Equipment Recommendations  None recommended by PT    Recommendations for Other Services       Precautions / Restrictions Precautions Precautions: Fall Restrictions Weight Bearing Restrictions: Yes LLE Weight Bearing: Partial weight bearing LLE Partial Weight Bearing Percentage or Pounds: 50    Mobility  Bed Mobility Overal bed mobility: Needs Assistance Bed Mobility: Supine to Sit     Supine to sit: Min assist     General bed mobility comments: cues for sequence and technique   Transfers Overall transfer level: Needs assistance Equipment used: Rolling walker (2 wheeled) Transfers: Sit to/from [facility] Sit to Stand: Min assist;Mod assist Stand pivot transfers: Min assist;Mod assist       General transfer comment: cues for hand placement and LE positioning. pivotal steps to chair, continues to have difficulty wt shifting to LLE to advance RLE  Ambulation/Gait                 Stairs             Wheelchair Mobility    Modified Rankin (Stroke Patients Only)       Balance   Sitting-balance support: No upper extremity supported;Feet supported Sitting balance-Leahy Scale: Fair       Standing balance-Leahy Scale: Poor Standing balance comment: reliant on UEs and external assist                            Cognition Arousal/Alertness: Awake/alert Behavior During Therapy: WFL for tasks assessed/performed Overall Cognitive Status: Within Functional Limits for tasks assessed                     Current  Attention Level: Sustained   Following Commands: Follows one step commands consistently;Follows multi-step commands with increased time              Exercises General Exercises - Lower Extremity Ankle Circles/Pumps: AROM;Both;15 reps Heel Slides: AAROM;Left;10 reps    General Comments        Pertinent Vitals/Pain Pain Assessment: 0-10 Pain Score: 3  Pain Location: left hip--with activity Pain Descriptors / Indicators: Grimacing;Sore Pain Intervention(s): Limited activity within patient's tolerance;Monitored during session;Premedicated before session;Repositioned    Home Living                      Prior Function            PT Goals (current goals can now be found in the care plan section) Acute Rehab PT Goals PT Goal Formulation: With patient Time For Goal Achievement: 09/18/19 Potential to Achieve Goals: Good Progress towards PT goals: Progressing toward goals    Frequency    Min 3X/week      PT Plan Current plan remains appropriate    Co-evaluation              AM-PAC PT "6 Clicks" Mobility   Outcome Measure  Help needed turning from your back to your side while in a flat  bed without using bedrails?: A Little Help needed moving from lying on your back to sitting on the side of a flat bed without using bedrails?: A Little Help needed moving to and from a bed to a chair (including a wheelchair)?: A Lot Help needed standing up from a chair using your arms (e.g., wheelchair or bedside chair)?: A Lot Help needed to walk in hospital room?: A Lot Help needed climbing 3-5 steps with a railing? : Total 6 Click Score: 13    End of Session Equipment Utilized During Treatment: Gait belt Activity Tolerance: Patient limited by fatigue;Patient limited by pain Patient left: in chair;with call bell/phone within reach;with chair alarm set Nurse Communication: Mobility status PT Visit Diagnosis: Difficulty in walking, not elsewhere classified  (R26.2);Muscle weakness (generalized) (M62.81);History of falling (Z91.81)     Time: 2010-0712 PT Time Calculation (min) (ACUTE ONLY): 19 min  Charges:  $Therapeutic Activity: 8-22 mins                     Baxter Flattery, PT   Acute Rehab Dept [facility]): [phone]   09/06/2019    [facility] 09/06/2019, 10:54 AM

## 2019-09-06 NOTE — TOC Initial Note (Signed)
Transition of Care [facility]) - Initial/Assessment Note    Patient Details  Name: Carol Duarte MRN: [id number] Date of Birth: 01/21/1925  Transition of Care [facility]) CM/SW Contact:    Leeroy Cha, RN Phone Number: 09/06/2019, 8:29 AM  Clinical Narrative:                 Faxed out to area snf's for consideration  Expected Discharge Plan: Skilled Nursing Facility Barriers to Discharge: No SNF bed   Patient Goals and CMS Choice Patient states their goals for this hospitalization and ongoing recovery are:: to go home CMS Medicare.gov Compare Post Acute Care list provided to:: Patient Choice offered to / list presented to : Patient  Expected Discharge Plan and Services Expected Discharge Plan: [place]   Discharge Planning Services: CM Consult Post Acute Care Choice: [place] Living arrangements for the past 2 months: Single Family Home                                      Prior Living Arrangements/Services Living arrangements for the past 2 months: Single Family Home Lives with:: Self Patient language and need for interpreter reviewed:: No Do you feel safe going back to the place where you live?: Yes      Need for Family Participation in Patient Care: Yes (Comment) Care giver support system in place?: Yes (comment)   Criminal Activity/Legal Involvement Pertinent to Current Situation/Hospitalization: No - Comment as needed  Activities of Daily Living Home Assistive Devices/Equipment: Walker (specify type) ADL Screening (condition at time of admission) Patient's cognitive ability adequate to safely complete daily activities?: Yes Is the patient deaf or have difficulty hearing?: Yes Does the patient have difficulty seeing, even when wearing glasses/contacts?: No Does the patient have difficulty concentrating, remembering, or making decisions?: No Patient able to express need for assistance with ADLs?: Yes Does the patient have  difficulty dressing or bathing?: No Independently performs ADLs?: Yes (appropriate for developmental age) Does the patient have difficulty walking or climbing stairs?: No Weakness of Legs: Left Weakness of Arms/Hands: None  Permission Sought/Granted Permission sought to share information with : Case Manager Permission granted to share information with : Yes, Verbal Permission Granted              Emotional Assessment Appearance:: Appears stated age Attitude/Demeanor/Rapport: Engaged Affect (typically observed): Calm Orientation: : Oriented to Self, Oriented to Place, Oriented to  Time, Oriented to Situation Alcohol / Substance Use: Not Applicable Psych Involvement: No (comment)  Admission diagnosis:  Hip fracture ([place]) [S72.009A] Shoulder pain [M25.519] Fall, initial encounter [W19.XXXA] Traumatic closed trochanteric fracture of femur with minimal displacement, left, initial encounter [facility]) [S72.102A] Patient Active Problem List   Diagnosis Date Noted  . Hip fracture ([place]) 09/01/2019  . Genetic testing 09/20/2018  . Goals of care, counseling/discussion 08/06/2018  . S/P left mastectomy 05/28/2018  . Malignant neoplasm of lower-outer quadrant of left female breast ([place])   . Invasive ductal carcinoma of left breast ([place]) 03/30/2017  . Unstable angina ([place]) 04/03/2016  . HTN (hypertension) 04/03/2016  . HLD (hyperlipidemia) 04/03/2016  . Bursitis, shoulder 07/10/2014   PCP:  [name], MD Pharmacy:   [place], [place] - [place] [address] [place] [address] [place] [address] Phone: [phone] Fax: [phone]     Social Determinants of Health (SDOH) Interventions    Readmission Risk Interventions No  flowsheet data found.

## 2019-09-07 DIAGNOSIS — N182 Chronic kidney disease, stage 2 (mild): Secondary | ICD-10-CM | POA: Diagnosis not present

## 2019-09-07 DIAGNOSIS — M2550 Pain in unspecified joint: Secondary | ICD-10-CM | POA: Diagnosis not present

## 2019-09-07 DIAGNOSIS — M8580 Other specified disorders of bone density and structure, unspecified site: Secondary | ICD-10-CM | POA: Diagnosis not present

## 2019-09-07 DIAGNOSIS — S72142D Displaced intertrochanteric fracture of left femur, subsequent encounter for closed fracture with routine healing: Secondary | ICD-10-CM | POA: Diagnosis not present

## 2019-09-07 DIAGNOSIS — I10 Essential (primary) hypertension: Secondary | ICD-10-CM | POA: Diagnosis not present

## 2019-09-07 DIAGNOSIS — R339 Retention of urine, unspecified: Secondary | ICD-10-CM | POA: Diagnosis not present

## 2019-09-07 DIAGNOSIS — C50912 Malignant neoplasm of unspecified site of left female breast: Secondary | ICD-10-CM | POA: Diagnosis not present

## 2019-09-07 DIAGNOSIS — E785 Hyperlipidemia, unspecified: Secondary | ICD-10-CM | POA: Diagnosis not present

## 2019-09-07 DIAGNOSIS — D62 Acute posthemorrhagic anemia: Secondary | ICD-10-CM | POA: Diagnosis not present

## 2019-09-07 DIAGNOSIS — Z7401 Bed confinement status: Secondary | ICD-10-CM | POA: Diagnosis not present

## 2019-09-07 DIAGNOSIS — R531 Weakness: Secondary | ICD-10-CM | POA: Diagnosis not present

## 2019-09-07 DIAGNOSIS — M6281 Muscle weakness (generalized): Secondary | ICD-10-CM | POA: Diagnosis not present

## 2019-09-07 DIAGNOSIS — I959 Hypotension, unspecified: Secondary | ICD-10-CM | POA: Diagnosis not present

## 2019-09-07 DIAGNOSIS — Z743 Need for continuous supervision: Secondary | ICD-10-CM | POA: Diagnosis not present

## 2019-09-07 DIAGNOSIS — R2689 Other abnormalities of gait and mobility: Secondary | ICD-10-CM | POA: Diagnosis not present

## 2019-09-07 DIAGNOSIS — S72002A Fracture of unspecified part of neck of left femur, initial encounter for closed fracture: Secondary | ICD-10-CM | POA: Diagnosis not present

## 2019-09-07 LAB — BASIC METABOLIC PANEL
Anion gap: 8 (ref 5–15)
BUN: 35 mg/dL — ABNORMAL HIGH (ref 8–23)
CO2: 25 mmol/L (ref 22–32)
Calcium: 8.4 mg/dL — ABNORMAL LOW (ref 8.9–10.3)
Chloride: 104 mmol/L (ref 98–111)
Creatinine, Ser: 0.92 mg/dL (ref 0.44–1.00)
GFR calc Af Amer: >60 mL/min (ref >60)
GFR calc non Af Amer: 54 mL/min — ABNORMAL LOW (ref >60)
Glucose, Bld: 130 mg/dL — ABNORMAL HIGH (ref 70–99)
Potassium: 4.4 mmol/L (ref 3.5–5.1)
Sodium: 137 mmol/L (ref 135–145)

## 2019-09-07 LAB — CBC
HCT: 27.8 % — ABNORMAL LOW (ref 36.0–46.0)
Hemoglobin: 8.7 g/dL — ABNORMAL LOW (ref 12.0–15.0)
MCH: 29.8 pg (ref 26.0–34.0)
MCHC: 31.3 g/dL (ref 30.0–36.0)
MCV: 95.2 fL (ref 80.0–100.0)
Platelets: 198 10*3/uL (ref 150–400)
RBC: 2.92 MIL/uL — ABNORMAL LOW (ref 3.87–5.11)
RDW: 14.9 % (ref 11.5–15.5)
WBC: 9.1 10*3/uL (ref 4.0–10.5)
nRBC: 0.3 % — ABNORMAL HIGH (ref 0.0–0.2)

## 2019-09-07 MED ORDER — METHOCARBAMOL 500 MG PO TABS: 500.0000 mg | ORAL_TABLET | Freq: Four times a day (QID) | ORAL | 0 refills | Status: DC | PRN

## 2019-09-07 MED ORDER — HEPARIN SOD (PORK) LOCK FLUSH 100 UNIT/ML IV SOLN
500.0000 [IU] | INTRAVENOUS | Status: AC | PRN
  Administered 2019-09-07: 500 [IU]
  Filled 2019-09-07: qty 5

## 2019-09-07 MED ORDER — HYDROCODONE-ACETAMINOPHEN 5-325 MG PO TABS: 1.0000 | ORAL_TABLET | Freq: Four times a day (QID) | ORAL | 0 refills | Status: DC | PRN

## 2019-09-07 NOTE — TOC Transition Note (Signed)
Transition of Care [facility]) - CM/SW Discharge Note   Patient Details  Name: Navi Ewton MRN: [id number] Date of Birth: [birth year]  Transition of Care [facility]) CM/SW Contact:  Gianne Shugars Dimitri Ped, LCSW Phone Number: 09/07/2019, 2:05 PM   Clinical Narrative:   Patient discharge to [facility] today via [place]. CSW arranged for transportation. CSW in contact with Tammy, Nurse Liason for facility.   Number to call for report is [phone]. Patient discharging to [facility], Room 129.   [place] Transitions of Care  Clinical Social Worker  Ph: [phone]     Final next level of care: Skilled Nursing Facility Barriers to Discharge: No Barriers Identified   Patient Goals and CMS Choice Patient states their goals for this hospitalization and ongoing recovery are:: to discharge to SNF for short term rehab and then return home CMS Medicare.gov Compare Post Acute Care list provided to:: Patient Choice offered to / list presented to : Patient  Discharge Placement              Patient chooses bed at: [facility] SNF) Patient to be transferred to facility by: [place] Name of family member notified: [facility]: [phone] Patient and family notified of of transfer: 09/07/19  Discharge Plan and Services   Discharge Planning Services: CM Consult Post Acute Care Choice: [place]                               Social Determinants of Health (SDOH) Interventions     Readmission Risk Interventions No flowsheet data found.

## 2019-09-07 NOTE — Discharge Summary (Signed)
Physician Discharge Summary  Carol Duarte XNA:[id number] DOB: 1924/12/03 DOA: 09/01/2019  PCP: [name], MD  Admit date: 09/01/2019 Discharge date: 09/07/2019  Admitted From: Home Disposition: SNF  Recommendations for Outpatient Follow-up:  1. Follow up with PCP in 1-2 weeks 2. Please obtain BMP/CBC in one week 3.   Advised to follow-up with orthopedics in 2 weeks. 4.   Patient is being discharged to SNF for rehabilitation. 5.   Patient is being discharged with [facility] catheter for urinary retention. Please try voiding trial  6.   Patient is being advised to take aspirin 325 twice a day for 30 days postoperative DVT prophylaxis. 7.   Pain control as needed.   Home Health: None Equipment/Devices: Foley's catheter  Discharge Condition: Stable CODE STATUS:Full code Diet recommendation: Heart Healthy / Carb Modified / Regular / Dysphagia   Hospital course:  Patient is a 86 year old female with history of hypertension, hyperlipidemia, breast cancer who presents to the emergency department with complaint of left hip pain after falling from a bathroom commode. X-ray imaging done in the emergency department showed acute, comminuted, displaced left-sided intertrochanteric fracture. Orthopedics consulted. Underwent ORIF (open reduction and internal fixation) on 09/03/19. She underwent transfusion with unit of PRBC. Her Hb remains stable. She failed voiding trial. She is being discharged to nursing home for rehabilitation.   She was managed for below problems.  Assessment & Plan:   Active Problems:   Hip fracture (HCC)  Left intertrochanteric femur fracture: X-ray as above. Orthopedics following. S/P ORIF. Continue pain management, supportive care. Aspirin 325 twice daily for 4 weeks for DVT prophylaxis for now. PT/OT recommended skilled nursing facility. Pain well controlled. Patient is usually physically active, ambulates with the help of walker.  History of left breast  cancer:On trastuzumab. Follows with her oncologist at [facility]. Status post mastectomy.She has a Chemo-Port on the right chest.  Acute blood loss normocytic anemia: Secondary to surgery. Hemoglobin dropped to 6.0 today. Transfuseda unit of PRBC. Started on iron PO. Hb remained stable at 8.5 - 8.7   Hypertension: Currently blood pressure well controlled. Continue home medicines  CKD stage RK:YHCWCBJSE kidney function at baseline.  DVT prophylaxis: Aspirin Code Status: Full code Family Communication: Discussed with patient in detail. Disposition Plan:  she is being discharged to nursing home,  Consultants:   Orthopedic  Discharge Diagnoses:  Active Problems:   Hip fracture [facility])    Discharge Instructions  Discharge Instructions    Partial weight bearing   Complete by: As directed    % Body Weight: 50   Laterality: left   Extremity: Lower     Allergies as of 09/07/2019   No Known Allergies     Medication List    STOP taking these medications   Aleve 220 MG tablet Generic drug: naproxen sodium   lidocaine-prilocaine cream Commonly known as: EMLA     TAKE these medications   ALPRAZolam 0.5 MG tablet Commonly known as: Xanax Take 1 tablet 30 min before PET   amLODipine 5 MG tablet Commonly known as: NORVASC Take 5 mg by mouth daily.   aspirin 325 MG EC tablet Take 1 tablet (325 mg total) by mouth 2 (two) times daily for 28 days. Then resume normal dose of Aspirin 81 mg twice daily. What changed:   medication strength  how much to take  when to take this  additional instructions   ferrous sulfate 325 (65 FE) MG tablet Take 325 mg by mouth daily with breakfast.   HYDROcodone-acetaminophen  5-325 MG tablet Commonly known as: NORCO/VICODIN Take 1-2 tablets by mouth every 6 (six) hours as needed for moderate pain or severe pain.   KADCYLA IV Inject into the vein every 21 ( twenty-one) days.   labetalol 100 MG tablet Commonly known as:  NORMODYNE Take 1 tablet (100 mg total) by mouth 2 (two) times daily.   methocarbamol 500 MG tablet Commonly known as: ROBAXIN Take 1 tablet (500 mg total) by mouth every 6 (six) hours as needed for muscle spasms.   oxybutynin 5 MG tablet Commonly known as: DITROPAN Take 5 mg by mouth daily.   simvastatin 40 MG tablet Commonly known as: ZOCOR Take 40 mg by mouth daily.   zolpidem 5 MG tablet Commonly known as: AMBIEN Take 1 tablet (5 mg total) by mouth at bedtime as needed for sleep.            Discharge Care Instructions  (From admission, onward)         Start     Ordered   09/05/19 0000  Partial weight bearing    Question Answer Comment  % Body Weight 50   Laterality left   Extremity Lower      09/05/19 0737         Follow-up Information    Paralee Cancel, MD. Schedule an appointment as soon as possible for a visit in 2 week(s).   Specialty: Orthopedic Surgery Contact information: [address] [place] [address] [phone]        [name], MD Follow up in 1 week(s).   Specialty: Internal Medicine Contact information: [place] [address] [phone]        Herminio Commons, MD .   Specialty: Cardiology Contact information: [place] [address] [phone]          No Known Allergies  Consultations:  Orthopedics   Procedures/Studies: DG Lumbar Spine Complete  Result Date: 09/01/2019 CLINICAL DATA:  Fall, left hip pain EXAM: LUMBAR SPINE - COMPLETE 4+ VIEW COMPARISON:  05/24/2019 FINDINGS: Osteopenia. No fracture or dislocation of the lumbar spine. There is moderate to severe multilevel disc degenerative disease and osteophytosis. Shunt catheter tubing projects about the abdomen and pelvis. Moderate burden of stool in the colon. IMPRESSION: Osteopenia. No fracture or dislocation of the lumbar spine. Moderate to severe multilevel disc degenerative disease and  osteophytosis. Electronically Signed   By: Eddie Candle M.D.   On: 09/01/2019 14:42   DG Shoulder Left Port  Result Date: 09/01/2019 CLINICAL DATA:  Left shoulder pain after fall EXAM: LEFT SHOULDER COMPARISON:  None. FINDINGS: No acute fracture or dislocation. Mild degenerative changes of the glenohumeral and acromioclavicular joints. No focal soft tissue swelling. There are surgical clips in the left axillary region. IMPRESSION: No acute fracture or dislocation of the left shoulder. Electronically Signed   By: Davina Poke D.O.   On: 09/01/2019 17:52   DG Epidural/Nerve Root  Result Date: 08/26/2019 CLINICAL DATA:  Lumbosacral spondylosis without myelopathy with radiculopathy. Left lateral hip and leg pain to the foot. No back pain. EXAM: EPIDURAL/NERVE ROOT FLUOROSCOPY TIME:  Radiation Exposure Index (as provided by the fluoroscopic device): 2.1 mGy Fluoroscopy Time:  4 seconds Number of Acquired Images:  0 PROCEDURE: The procedure, risks, benefits, and alternatives were explained to the patient. Questions regarding the procedure were encouraged and answered. The patient understands and consents to the procedure. LEFT L5 NERVE ROOT BLOCK AND TRANSFORAMINAL EPIDURAL: A posterior oblique approach  was taken to the intervertebral foramen on the left at L5-S1 using a 3.5 inch 22 gauge spinal needle. Injection of Isovue M 200 outlined the left L5 nerve root and showed good epidural spread. No vascular opacification is seen. 120 mg of Depo-Medrol mixed with 2 mL of 1% lidocaine were instilled. The procedure was well-tolerated, and the patient was discharged thirty minutes following the injection in good condition. COMPLICATIONS: None immediate. IMPRESSION: Technically successful injection consisting of a left L5 nerve root block and transforaminal epidural. Electronically Signed   By: Titus Dubin M.D.   On: 08/26/2019 15:52   DG C-Arm 1-60 Min-No Report  Result Date: 09/03/2019 Fluoroscopy was  utilized by the requesting physician.  No radiographic interpretation.   DG HIP OPERATIVE UNILAT W OR W/O PELVIS LEFT  Result Date: 09/03/2019 CLINICAL DATA:  Intraoperative imaging for fixation of a left intertrochanteric fracture the patient suffered in a fall 09/01/2019. Initial encounter. EXAM: OPERATIVE LEFT HIP (WITH PELVIS IF PERFORMED) 5 VIEWS TECHNIQUE: Fluoroscopic spot image(s) were submitted for interpretation post-operatively. COMPARISON:  Plain films left hip 09/01/2019. FINDINGS: Hip screw and short intramedullary nail with a single distal screw are in place for fixation of an intertrochanteric fracture. Position and alignment are markedly improved. No acute abnormality. IMPRESSION: Intraoperative imaging for fixation of a left intertrochanteric fracture. Electronically Signed   By: Inge Rise M.D.   On: 09/03/2019 14:27   DG Hip Unilat W or Wo Pelvis 2-3 Views Left  Result Date: 09/01/2019 CLINICAL DATA:  Post fall, now with left hip pain. EXAM: DG HIP (WITH OR WITHOUT PELVIS) 2-3V LEFT COMPARISON:  None. FINDINGS: There is a comminuted intertrochanteric left femur fracture with associated foreshortening and accentuated varus angulation the fracture extends to involve the base of the lesser trochanter which is mildly displaced. No definite intra-articular extension. No additional fractures identified. Expected adjacent soft tissue swelling.  No radiopaque foreign body. Ventriculoperitoneal catheter tubing overlies the midline of the lower abdomen. Multiple phleboliths overlie the midline of lower pelvis. IMPRESSION: Acute, comminuted, displaced left-sided intertrochanteric femur fracture with associated foreshortening and varus angulation. Electronically Signed   By: Sandi Mariscal M.D.   On: 09/01/2019 14:44    ORIF   Subjective:   Discharge Exam: Vitals:   09/07/19 0544 09/07/19 0840  BP: (!) 140/59 (!) 149/63  Pulse: 63 65  Resp: 16 14  Temp: 98.6 F (37 C)   SpO2: 97%  98%   Vitals:   09/06/19 1448 09/06/19 2148 09/07/19 0544 09/07/19 0840  BP: (!) 122/54 135/61 (!) 140/59 (!) 149/63  Pulse: 61 63 63 65  Resp: 16 20 16 14   Temp: 98.4 F (36.9 C) 98.7 F (37.1 C) 98.6 F (37 C)   TempSrc: Oral Oral Oral   SpO2: 99% 98% 97% 98%  Weight:      Height:        General: Pt is alert, awake, not in acute distress Cardiovascular: RRR, S1/S2 +, no rubs, no gallops Respiratory: CTA bilaterally, no wheezing, no rhonchi Abdominal: Soft, NT, ND, bowel sounds + Extremities: no edema, no cyanosis    The results of significant diagnostics from this hospitalization (including imaging, microbiology, ancillary and laboratory) are listed below for reference.     Microbiology: Recent Results (from the past 240 hour(s))  SARS CORONAVIRUS 2 (TAT 6-24 HRS) Nasopharyngeal Nasopharyngeal Swab     Status: None   Collection Time: 09/01/19  4:18 PM   Specimen: Nasopharyngeal Swab  Result Value Ref Range Status  SARS Coronavirus 2 NEGATIVE NEGATIVE Final    Comment: (NOTE) SARS-CoV-2 target nucleic acids are NOT DETECTED. The SARS-CoV-2 RNA is generally detectable in upper and lower respiratory specimens during the acute phase of infection. Negative results do not preclude SARS-CoV-2 infection, do not rule out co-infections with other pathogens, and should not be used as the sole basis for treatment or other patient management decisions. Negative results must be combined with clinical observations, patient history, and epidemiological information. The expected result is Negative. Fact Sheet for Patients: SugarRoll.be Fact Sheet for Healthcare Providers: https://www.woods-mathews.com/ This test is not yet approved or cleared by the [place] FDA and  has been authorized for detection and/or diagnosis of SARS-CoV-2 by FDA under an Emergency Use Authorization (EUA). This EUA will remain  in effect (meaning this test can  be used) for the duration of the COVID-19 declaration under Section 56 4(b)(1) of the Act, 21 U.S.C. section 360bbb-3(b)(1), unless the authorization is terminated or revoked sooner. Performed at [facility], [place] [address], [place], [place] [address]   Respiratory Panel by RT PCR (Flu A&B, Covid) - Nasopharyngeal Swab     Status: None   Collection Time: 09/01/19  9:21 PM   Specimen: Nasopharyngeal Swab  Result Value Ref Range Status   SARS Coronavirus 2 by RT PCR NEGATIVE NEGATIVE Final    Comment: (NOTE) SARS-CoV-2 target nucleic acids are NOT DETECTED. The SARS-CoV-2 RNA is generally detectable in upper respiratoy specimens during the acute phase of infection. The lowest concentration of SARS-CoV-2 viral copies this assay can detect is 131 copies/mL. A negative result does not preclude SARS-Cov-2 infection and should not be used as the sole basis for treatment or other patient management decisions. A negative result may occur with  improper specimen collection/handling, submission of specimen other than nasopharyngeal swab, presence of viral mutation(s) within the areas targeted by this assay, and inadequate number of viral copies (<131 copies/mL). A negative result must be combined with clinical observations, patient history, and epidemiological information. The expected result is Negative. Fact Sheet for Patients:  PinkCheek.be Fact Sheet for Healthcare Providers:  GravelBags.it This test is not yet ap proved or cleared by the [place] FDA and  has been authorized for detection and/or diagnosis of SARS-CoV-2 by FDA under an Emergency Use Authorization (EUA). This EUA will remain  in effect (meaning this test can be used) for the duration of the COVID-19 declaration under Section 564(b)(1) of the Act, 21 U.S.C. section 360bbb-3(b)(1), unless the authorization is terminated or revoked sooner.    Influenza A  by PCR NEGATIVE NEGATIVE Final   Influenza B by PCR NEGATIVE NEGATIVE Final    Comment: (NOTE) The Xpert Xpress SARS-CoV-2/FLU/RSV assay is intended as an aid in  the diagnosis of influenza from Nasopharyngeal swab specimens and  should not be used as a sole basis for treatment. Nasal washings and  aspirates are unacceptable for Xpert Xpress SARS-CoV-2/FLU/RSV  testing. Fact Sheet for Patients: PinkCheek.be Fact Sheet for Healthcare Providers: GravelBags.it This test is not yet approved or cleared by the [place] FDA and  has been authorized for detection and/or diagnosis of SARS-CoV-2 by  FDA under an Emergency Use Authorization (EUA). This EUA will remain  in effect (meaning this test can be used) for the duration of the  Covid-19 declaration under Section 564(b)(1) of the Act, 21  U.S.C. section 360bbb-3(b)(1), unless the authorization is  terminated or revoked. Performed at [facility], [address] [place], [place] [address]  MRSA PCR Screening     Status: None   Collection Time: 09/02/19  3:12 AM   Specimen: Nasopharyngeal  Result Value Ref Range Status   MRSA by PCR NEGATIVE NEGATIVE Final    Comment:        The GeneXpert MRSA Assay (FDA approved for NASAL specimens only), is one component of a comprehensive MRSA colonization surveillance program. It is not intended to diagnose MRSA infection nor to guide or monitor treatment for MRSA infections. Performed at [facility], [place] [address], [place], [place] [address]   SARS CORONAVIRUS 2 (TAT 6-24 HRS) Nasopharyngeal Nasopharyngeal Swab     Status: None   Collection Time: 09/06/19  2:49 PM   Specimen: Nasopharyngeal Swab  Result Value Ref Range Status   SARS Coronavirus 2 NEGATIVE NEGATIVE Final    Comment: (NOTE) SARS-CoV-2 target nucleic acids are NOT DETECTED. The SARS-CoV-2 RNA is generally detectable in upper and  lower respiratory specimens during the acute phase of infection. Negative results do not preclude SARS-CoV-2 infection, do not rule out co-infections with other pathogens, and should not be used as the sole basis for treatment or other patient management decisions. Negative results must be combined with clinical observations, patient history, and epidemiological information. The expected result is Negative. Fact Sheet for Patients: SugarRoll.be Fact Sheet for Healthcare Providers: https://www.woods-mathews.com/ This test is not yet approved or cleared by the [place] FDA and  has been authorized for detection and/or diagnosis of SARS-CoV-2 by FDA under an Emergency Use Authorization (EUA). This EUA will remain  in effect (meaning this test can be used) for the duration of the COVID-19 declaration under Section 56 4(b)(1) of the Act, 21 U.S.C. section 360bbb-3(b)(1), unless the authorization is terminated or revoked sooner. Performed at [facility], [place] [address] [place], [place] [address]      Labs: BNP (last 3 results) No results for input(s): BNP in the last 8760 hours. Basic Metabolic Panel: Recent Labs  Lab 09/01/19 1527 09/02/19 0322 09/04/19 0338 09/07/19 0430  NA 134* 137 134* 137  K 4.1 4.8 4.9 4.4  CL 101 102 102 104  CO2 21* 23 24 25   GLUCOSE 152* 154* 190* 130*  BUN 32* 36* 32* 35*  CREATININE 1.11* 1.21* 1.09* 0.92  CALCIUM 9.1 9.3 8.5* 8.4*  MG  --  2.6*  --   --    Liver Function Tests: Recent Labs  Lab 09/02/19 0322  AST 37  ALT 32  ALKPHOS 61  BILITOT 0.7  PROT 6.8  ALBUMIN 3.9   No results for input(s): LIPASE, AMYLASE in the last 168 hours. No results for input(s): AMMONIA in the last 168 hours. CBC: Recent Labs  Lab 09/02/19 0322 09/04/19 0338 09/05/19 0415 09/06/19 0212 09/06/19 0923 09/07/19 0430  WBC 10.6* 10.5 8.3 8.9 9.7 9.1  NEUTROABS 8.5* 9.1* 6.5 6.3 7.1  --   HGB 11.7*  8.3* 6.6* 8.5* 9.0* 8.7*  HCT 36.9 26.5* 21.0* 26.4* 29.4* 27.8*  MCV 92.7 95.0 96.3 95.7 95.8 95.2  PLT 190 188 153 161 186 198   Cardiac Enzymes: No results for input(s): CKTOTAL, CKMB, CKMBINDEX, TROPONINI in the last 168 hours. BNP: Invalid input(s): POCBNP CBG: Recent Labs  Lab 09/01/19 2200 09/02/19 0544 09/02/19 0759  GLUCAP 184* 142* 126*   D-Dimer No results for input(s): DDIMER in the last 72 hours. Hgb A1c No results for input(s): HGBA1C in the last 72 hours. Lipid Profile No results for input(s): CHOL, HDL,  LDLCALC, TRIG, CHOLHDL, LDLDIRECT in the last 72 hours. Thyroid function studies No results for input(s): TSH, T4TOTAL, T3FREE, THYROIDAB in the last 72 hours.  Invalid input(s): FREET3 Anemia work up No results for input(s): VITAMINB12, FOLATE, FERRITIN, TIBC, IRON, RETICCTPCT in the last 72 hours. Urinalysis    Component Value Date/Time   COLORURINE YELLOW 09/03/2019 1620   APPEARANCEUR CLEAR 09/03/2019 1620   LABSPEC 1.014 09/03/2019 1620   PHURINE 6.0 09/03/2019 1620   GLUCOSEU NEGATIVE 09/03/2019 1620   HGBUR SMALL (A) 09/03/2019 1620   BILIRUBINUR NEGATIVE 09/03/2019 1620   KETONESUR NEGATIVE 09/03/2019 1620   PROTEINUR NEGATIVE 09/03/2019 1620   UROBILINOGEN 0.2 11/20/2013 1426   NITRITE NEGATIVE 09/03/2019 1620   LEUKOCYTESUR NEGATIVE 09/03/2019 1620   Sepsis Labs Invalid input(s): PROCALCITONIN,  WBC,  LACTICIDVEN Microbiology Recent Results (from the past 240 hour(s))  SARS CORONAVIRUS 2 (TAT 6-24 HRS) Nasopharyngeal Nasopharyngeal Swab     Status: None   Collection Time: 09/01/19  4:18 PM   Specimen: Nasopharyngeal Swab  Result Value Ref Range Status   SARS Coronavirus 2 NEGATIVE NEGATIVE Final    Comment: (NOTE) SARS-CoV-2 target nucleic acids are NOT DETECTED. The SARS-CoV-2 RNA is generally detectable in upper and lower respiratory specimens during the acute phase of infection. Negative results do not preclude SARS-CoV-2  infection, do not rule out co-infections with other pathogens, and should not be used as the sole basis for treatment or other patient management decisions. Negative results must be combined with clinical observations, patient history, and epidemiological information. The expected result is Negative. Fact Sheet for Patients: SugarRoll.be Fact Sheet for Healthcare Providers: https://www.woods-mathews.com/ This test is not yet approved or cleared by the [place] FDA and  has been authorized for detection and/or diagnosis of SARS-CoV-2 by FDA under an Emergency Use Authorization (EUA). This EUA will remain  in effect (meaning this test can be used) for the duration of the COVID-19 declaration under Section 56 4(b)(1) of the Act, 21 U.S.C. section 360bbb-3(b)(1), unless the authorization is terminated or revoked sooner. Performed at [facility], [place] [address], [place], [place] [address]   Respiratory Panel by RT PCR (Flu A&B, Covid) - Nasopharyngeal Swab     Status: None   Collection Time: 09/01/19  9:21 PM   Specimen: Nasopharyngeal Swab  Result Value Ref Range Status   SARS Coronavirus 2 by RT PCR NEGATIVE NEGATIVE Final    Comment: (NOTE) SARS-CoV-2 target nucleic acids are NOT DETECTED. The SARS-CoV-2 RNA is generally detectable in upper respiratoy specimens during the acute phase of infection. The lowest concentration of SARS-CoV-2 viral copies this assay can detect is 131 copies/mL. A negative result does not preclude SARS-Cov-2 infection and should not be used as the sole basis for treatment or other patient management decisions. A negative result may occur with  improper specimen collection/handling, submission of specimen other than nasopharyngeal swab, presence of viral mutation(s) within the areas targeted by this assay, and inadequate number of viral copies (<131 copies/mL). A negative result must be combined with  clinical observations, patient history, and epidemiological information. The expected result is Negative. Fact Sheet for Patients:  PinkCheek.be Fact Sheet for Healthcare Providers:  GravelBags.it This test is not yet ap proved or cleared by the [place] FDA and  has been authorized for detection and/or diagnosis of SARS-CoV-2 by FDA under an Emergency Use Authorization (EUA). This EUA will remain  in effect (meaning this test can be used) for the duration of the COVID-19  declaration under Section 564(b)(1) of the Act, 21 U.S.C. section 360bbb-3(b)(1), unless the authorization is terminated or revoked sooner.    Influenza A by PCR NEGATIVE NEGATIVE Final   Influenza B by PCR NEGATIVE NEGATIVE Final    Comment: (NOTE) The Xpert Xpress SARS-CoV-2/FLU/RSV assay is intended as an aid in  the diagnosis of influenza from Nasopharyngeal swab specimens and  should not be used as a sole basis for treatment. Nasal washings and  aspirates are unacceptable for Xpert Xpress SARS-CoV-2/FLU/RSV  testing. Fact Sheet for Patients: PinkCheek.be Fact Sheet for Healthcare Providers: GravelBags.it This test is not yet approved or cleared by the [place] FDA and  has been authorized for detection and/or diagnosis of SARS-CoV-2 by  FDA under an Emergency Use Authorization (EUA). This EUA will remain  in effect (meaning this test can be used) for the duration of the  Covid-19 declaration under Section 564(b)(1) of the Act, 21  U.S.C. section 360bbb-3(b)(1), unless the authorization is  terminated or revoked. Performed at [facility], [address] [place], [place] [address]   MRSA PCR Screening     Status: None   Collection Time: 09/02/19  3:12 AM   Specimen: Nasopharyngeal  Result Value Ref Range Status   MRSA by PCR NEGATIVE NEGATIVE Final    Comment:        The GeneXpert  MRSA Assay (FDA approved for NASAL specimens only), is one component of a comprehensive MRSA colonization surveillance program. It is not intended to diagnose MRSA infection nor to guide or monitor treatment for MRSA infections. Performed at [facility], [place] [address] [place], [place] [address]   SARS CORONAVIRUS 2 (TAT 6-24 HRS) Nasopharyngeal Nasopharyngeal Swab     Status: None   Collection Time: 09/06/19  2:49 PM   Specimen: Nasopharyngeal Swab  Result Value Ref Range Status   SARS Coronavirus 2 NEGATIVE NEGATIVE Final    Comment: (NOTE) SARS-CoV-2 target nucleic acids are NOT DETECTED. The SARS-CoV-2 RNA is generally detectable in upper and lower respiratory specimens during the acute phase of infection. Negative results do not preclude SARS-CoV-2 infection, do not rule out co-infections with other pathogens, and should not be used as the sole basis for treatment or other patient management decisions. Negative results must be combined with clinical observations, patient history, and epidemiological information. The expected result is Negative. Fact Sheet for Patients: SugarRoll.be Fact Sheet for Healthcare Providers: https://www.woods-mathews.com/ This test is not yet approved or cleared by the [place] FDA and  has been authorized for detection and/or diagnosis of SARS-CoV-2 by FDA under an Emergency Use Authorization (EUA). This EUA will remain  in effect (meaning this test can be used) for the duration of the COVID-19 declaration under Section 56 4(b)(1) of the Act, 21 U.S.C. section 360bbb-3(b)(1), unless the authorization is terminated or revoked sooner. Performed at [facility], [place] [address], [place], [place] [address]      Time coordinating discharge: Over 30 minutes  SIGNED:   Shawna Clamp, MD  [facility] 09/07/2019, 10:52 AM Pager   If 7PM-7AM, please contact  night-coverage [email or web address]

## 2019-09-07 NOTE — Discharge Instructions (Signed)
Advised to follow-up with PCP in 1 week. We will request PCP to check her CBC and BMP in 1 week. Advised to follow-up with orthopedics in 2 weeks. Patient is being discharged to SNF for rehabilitation. Patient is being discharged with [facility] catheter for urinary retention. Patient is being advised to take aspirin 325 twice a day for 30 days postoperatively.

## 2019-09-07 NOTE — Progress Notes (Signed)
Physical Therapy Treatment Patient Details Name: Carol Duarte MRN: [id number] DOB: Aug 17, 1925 Today's Date: 09/07/2019    History of Present Illness 86 y.o. female admitted after  a fall at home. s/p  ORIF LEFT HIP FEMUR FRACTURE per Dr Alvan Dame on 09/03/19. PMH: L breast CA, HTN    PT Comments    Pt progressing slowly. Continue to recommend SNF. Pt is motivated and participatory with PT, able to take a few steps but requiring incr cues for task sequencing. Asking to get back to bed today however encouraged her to sit up in chair for a bit.    Follow Up Recommendations  SNF     Equipment Recommendations  None recommended by PT    Recommendations for Other Services       Precautions / Restrictions Precautions Precautions: Fall Restrictions LLE Weight Bearing: Partial weight bearing LLE Partial Weight Bearing Percentage or Pounds: 50    Mobility  Bed Mobility               General bed mobility comments: OOB on BSC on arrival  Transfers Overall transfer level: Needs assistance Equipment used: Rolling walker (2 wheeled) Transfers: Sit to/from Stand Sit to Stand: Min assist;Mod assist         General transfer comment: cues for hand placement and LE positioning. pivotal steps to chair, continues to have difficulty wt shifting to LLE to advance RLE  Ambulation/Gait Ambulation/Gait assistance: Mod assist Gait Distance (Feet): 5 Feet Assistive device: Rolling walker (2 wheeled) Gait Pattern/deviations: Step-to pattern     General Gait Details: incr time, multi-modal cues for sequence, use of UEs to un-wt LLE to advance RLE    Stairs             Wheelchair Mobility    Modified Rankin (Stroke Patients Only)       Balance   Sitting-balance support: No upper extremity supported;Feet supported Sitting balance-Leahy Scale: Fair       Standing balance-Leahy Scale: Poor Standing balance comment: reliant on UEs and external assist                             Cognition Arousal/Alertness: Awake/alert Behavior During Therapy: WFL for tasks assessed/performed Overall Cognitive Status: Impaired/Different from baseline Area of Impairment: Problem solving                   Current Attention Level: Sustained   Following Commands: Follows one step commands with increased time;Follows multi-step commands inconsistently     Problem Solving: Slow processing;Difficulty sequencing;Requires verbal cues;Requires tactile cues        Exercises General Exercises - Lower Extremity Ankle Circles/Pumps: AROM;Both;15 reps Quad Sets: AROM;Both;5 reps Heel Slides: AAROM;Left;10 reps;AROM Hip ABduction/ADduction: AROM;AAROM;Left;10 reps    General Comments        Pertinent Vitals/Pain Pain Assessment: 0-10 Pain Score: 2  Pain Location: left hip--with activity Pain Descriptors / Indicators: Grimacing;Sore Pain Intervention(s): Limited activity within patient's tolerance;Monitored during session;Premedicated before session;Repositioned;Ice applied    Home Living                      Prior Function            PT Goals (current goals can now be found in the care plan section) Acute Rehab PT Goals PT Goal Formulation: With patient Time For Goal Achievement: 09/18/19 Potential to Achieve Goals: Good Progress towards PT goals: Progressing toward goals  Frequency    Min 3X/week      PT Plan Current plan remains appropriate    Co-evaluation              AM-PAC PT "6 Clicks" Mobility   Outcome Measure  Help needed turning from your back to your side while in a flat bed without using bedrails?: A Little Help needed moving from lying on your back to sitting on the side of a flat bed without using bedrails?: A Little Help needed moving to and from a bed to a chair (including a wheelchair)?: A Lot Help needed standing up from a chair using your arms (e.g., wheelchair or bedside chair)?: A Lot Help  needed to walk in hospital room?: A Lot Help needed climbing 3-5 steps with a railing? : Total 6 Click Score: 13    End of Session Equipment Utilized During Treatment: Gait belt Activity Tolerance: Patient tolerated treatment well Patient left: in chair;with call bell/phone within reach;with chair alarm set Nurse Communication: Mobility status PT Visit Diagnosis: Difficulty in walking, not elsewhere classified (R26.2);Muscle weakness (generalized) (M62.81);History of falling (Z91.81)     Time: 1006-1030 PT Time Calculation (min) (ACUTE ONLY): 24 min  Charges:  $Gait Training: 8-22 mins $Therapeutic Exercise: 8-22 mins                     Baxter Flattery, PT   Acute Rehab Dept [facility]): [phone]   09/07/2019    [facility] 09/07/2019, 10:43 AM

## 2019-09-07 NOTE — Progress Notes (Signed)
Patient was due to void around 2000 (09/06/2019), at that time the patient was not able to void. Patient had to be straight cath (in and out) twice, once last night and this morning. I/Os have been documented in flowsheet. Norlene Duel RN, BSN

## 2019-09-09 DIAGNOSIS — C50912 Malignant neoplasm of unspecified site of left female breast: Secondary | ICD-10-CM | POA: Diagnosis not present

## 2019-09-09 DIAGNOSIS — I10 Essential (primary) hypertension: Secondary | ICD-10-CM | POA: Diagnosis not present

## 2019-09-09 DIAGNOSIS — D500 Iron deficiency anemia secondary to blood loss (chronic): Secondary | ICD-10-CM | POA: Diagnosis not present

## 2019-09-09 DIAGNOSIS — E7800 Pure hypercholesterolemia, unspecified: Secondary | ICD-10-CM | POA: Diagnosis not present

## 2019-09-09 DIAGNOSIS — M6281 Muscle weakness (generalized): Secondary | ICD-10-CM | POA: Diagnosis not present

## 2019-09-09 DIAGNOSIS — Z86000 Personal history of in-situ neoplasm of breast: Secondary | ICD-10-CM | POA: Diagnosis not present

## 2019-09-09 DIAGNOSIS — R339 Retention of urine, unspecified: Secondary | ICD-10-CM | POA: Diagnosis not present

## 2019-09-09 DIAGNOSIS — Z1159 Encounter for screening for other viral diseases: Secondary | ICD-10-CM | POA: Diagnosis not present

## 2019-09-09 DIAGNOSIS — Z4789 Encounter for other orthopedic aftercare: Secondary | ICD-10-CM | POA: Diagnosis not present

## 2019-09-09 DIAGNOSIS — S72142D Displaced intertrochanteric fracture of left femur, subsequent encounter for closed fracture with routine healing: Secondary | ICD-10-CM | POA: Diagnosis not present

## 2019-09-09 DIAGNOSIS — D649 Anemia, unspecified: Secondary | ICD-10-CM | POA: Diagnosis not present

## 2019-09-09 DIAGNOSIS — S72001D Fracture of unspecified part of neck of right femur, subsequent encounter for closed fracture with routine healing: Secondary | ICD-10-CM | POA: Diagnosis not present

## 2019-09-09 DIAGNOSIS — D62 Acute posthemorrhagic anemia: Secondary | ICD-10-CM | POA: Diagnosis not present

## 2019-09-09 DIAGNOSIS — N1831 Chronic kidney disease, stage 3a: Secondary | ICD-10-CM | POA: Diagnosis not present

## 2019-09-09 DIAGNOSIS — R2689 Other abnormalities of gait and mobility: Secondary | ICD-10-CM | POA: Diagnosis not present

## 2019-09-09 DIAGNOSIS — R799 Abnormal finding of blood chemistry, unspecified: Secondary | ICD-10-CM | POA: Diagnosis not present

## 2019-09-09 DIAGNOSIS — I519 Heart disease, unspecified: Secondary | ICD-10-CM | POA: Diagnosis not present

## 2019-09-09 DIAGNOSIS — D509 Iron deficiency anemia, unspecified: Secondary | ICD-10-CM | POA: Diagnosis not present

## 2019-09-09 DIAGNOSIS — N184 Chronic kidney disease, stage 4 (severe): Secondary | ICD-10-CM | POA: Diagnosis not present

## 2019-09-09 DIAGNOSIS — K5900 Constipation, unspecified: Secondary | ICD-10-CM | POA: Diagnosis not present

## 2019-09-10 ENCOUNTER — Ambulatory Visit (HOSPITAL_COMMUNITY): Admission: RE | Admit: 2019-09-10 | Payer: Medicare Other | Source: Ambulatory Visit

## 2019-09-10 DIAGNOSIS — Z1159 Encounter for screening for other viral diseases: Secondary | ICD-10-CM | POA: Diagnosis not present

## 2019-09-10 DIAGNOSIS — D649 Anemia, unspecified: Secondary | ICD-10-CM | POA: Diagnosis not present

## 2019-09-12 ENCOUNTER — Ambulatory Visit (HOSPITAL_COMMUNITY): Payer: Medicare Other | Admitting: Hematology

## 2019-09-12 ENCOUNTER — Ambulatory Visit (HOSPITAL_COMMUNITY): Payer: Medicare Other

## 2019-09-12 ENCOUNTER — Telehealth (INDEPENDENT_AMBULATORY_CARE_PROVIDER_SITE_OTHER): Payer: Medicare Other | Admitting: Cardiovascular Disease

## 2019-09-12 ENCOUNTER — Encounter: Payer: Self-pay | Admitting: Cardiovascular Disease

## 2019-09-12 ENCOUNTER — Other Ambulatory Visit (HOSPITAL_COMMUNITY): Payer: Medicare Other

## 2019-09-12 VITALS — BP 129/77 | HR 72 | Temp 97.6°F | Resp 18

## 2019-09-12 DIAGNOSIS — I10 Essential (primary) hypertension: Secondary | ICD-10-CM | POA: Diagnosis not present

## 2019-09-12 DIAGNOSIS — D500 Iron deficiency anemia secondary to blood loss (chronic): Secondary | ICD-10-CM | POA: Diagnosis not present

## 2019-09-12 DIAGNOSIS — I519 Heart disease, unspecified: Secondary | ICD-10-CM

## 2019-09-12 DIAGNOSIS — S72142D Displaced intertrochanteric fracture of left femur, subsequent encounter for closed fracture with routine healing: Secondary | ICD-10-CM | POA: Diagnosis not present

## 2019-09-12 DIAGNOSIS — E7800 Pure hypercholesterolemia, unspecified: Secondary | ICD-10-CM | POA: Diagnosis not present

## 2019-09-12 DIAGNOSIS — R339 Retention of urine, unspecified: Secondary | ICD-10-CM | POA: Diagnosis not present

## 2019-09-12 DIAGNOSIS — I5189 Other ill-defined heart diseases: Secondary | ICD-10-CM

## 2019-09-12 NOTE — Progress Notes (Signed)
Virtual Visit via Telephone Note   This visit type was conducted due to national recommendations for restrictions regarding the COVID-19 Pandemic (e.g. social distancing) in an effort to limit this patient's exposure and mitigate transmission in our community.  Due to her co-morbid illnesses, this patient is at least at moderate risk for complications without adequate follow up.  This format is felt to be most appropriate for this patient at this time.  The patient did not have access to video technology/had technical difficulties with video requiring transitioning to audio format only (telephone).  All issues noted in this document were discussed and addressed.  No physical exam could be performed with this format.  Please refer to the patient's chart for her  consent to telehealth for [facility].   Date:  09/12/2019   ID:  Carol Duarte, DOB April 14, 1925, MRN [id number]  Patient Location: Home Provider Location: Home  PCP:  [name], MD  Cardiologist:  Kate Sable, MD  Electrophysiologist:  None   Evaluation Performed:  Follow-Up Visit  Chief Complaint:  HTN  History of Present Illness:    Carol Duarte is a 86 y.o. female with HTN and HDL. She also has grade 2 diastolic dysfunction.  She was recently hospitalized for a comminuted, displaced left-sided intertrochanteric fracture.  She underwent open reduction and internal fixation on 09/03/2019.  She was transfused 1 unit of packed red blood cells.  I spoke with her nurse as she is currently on a rehabilitation facility.  The patient has not complained of chest pain, palpitations, nor shortness of breath.   Past Medical History:  Diagnosis Date  . Cancer ([place])    left breast  . History of kidney stones   . Hypercholesteremia   . Hypertension    Past Surgical History:  Procedure Laterality Date  . APPENDECTOMY    . brain cyst removed  2011  . BREAST BIOPSY Left 03/28/2018   Procedure: BREAST BIOPSY;   Surgeon: [name], MD;  Location: [facility];  Service: General;  Laterality: Left;  . CATARACT EXTRACTION W/PHACO Left 11/09/2015   Procedure: CATARACT EXTRACTION PHACO AND INTRAOCULAR LENS PLACEMENT LEFT EYE cde=8.97;  Surgeon: [name], MD;  Location: [facility];  Service: Ophthalmology;  Laterality: Left;  . CATARACT EXTRACTION W/PHACO Right 02/04/2019   Procedure: CATARACT EXTRACTION PHACO AND INTRAOCULAR LENS PLACEMENT (IOC);  Surgeon: [name], MD;  Location: [facility];  Service: Ophthalmology;  Laterality: Right;  CDE: 15.19  . EYE SURGERY     KPE left  . MASTECTOMY MODIFIED RADICAL Left 05/28/2018   Procedure: LEFT MODIFIED RADICAL MASTECTOMY;  Surgeon: [name], MD;  Location: [facility];  Service: General;  Laterality: Left;  Marland Kitchen MASTECTOMY, PARTIAL Left 04/24/2017   Procedure: MASTECTOMY PARTIAL;  Surgeon: [name], MD;  Location: [facility];  Service: General;  Laterality: Left;  . ORIF FEMUR FRACTURE Left 09/03/2019   Procedure: OPEN REDUCTION INTERNAL FIXATION (ORIF) LEFT HIP FEMUR FRACTURE;  Surgeon: Paralee Cancel, MD;  Location: [facility];  Service: Orthopedics;  Laterality: Left;  . PORTACATH PLACEMENT Right 08/08/2018   Procedure: INSERTION PORT-A-CATH;  Surgeon: [name], MD;  Location: [facility];  Service: General;  Laterality: Right;  . TONSILLECTOMY    . TONSILLECTOMY AND ADENOIDECTOMY       Current Meds  Medication Sig  . Ado-Trastuzumab Emtansine (KADCYLA IV) Inject into the vein every 21 ( twenty-one) days.  . ALPRAZolam (XANAX) 0.5 MG tablet Take 1 tablet 30 min before PET  .  amLODipine (NORVASC) 5 MG tablet Take 5 mg by mouth daily.  Marland Kitchen aspirin 325 MG EC tablet Take 1 tablet (325 mg total) by mouth 2 (two) times daily for 28 days. Then resume normal dose of Aspirin 81 mg twice daily.  . ferrous sulfate 325 (65 FE) MG tablet Take 325 mg by mouth daily with breakfast.  . HYDROcodone-acetaminophen (NORCO/VICODIN) 5-325 MG tablet Take 1 tablet by mouth every 6 (six) hours  as needed for moderate pain or severe pain.  Marland Kitchen labetalol (NORMODYNE) 100 MG tablet Take 1 tablet (100 mg total) by mouth 2 (two) times daily.  . methocarbamol (ROBAXIN) 500 MG tablet Take 1 tablet (500 mg total) by mouth every 6 (six) hours as needed for muscle spasms.  Marland Kitchen oxybutynin (DITROPAN) 5 MG tablet Take 5 mg by mouth daily.   . simvastatin (ZOCOR) 40 MG tablet Take 40 mg by mouth daily.  Marland Kitchen zolpidem (AMBIEN) 5 MG tablet Take 1 tablet (5 mg total) by mouth at bedtime as needed for sleep.  . [DISCONTINUED] prochlorperazine (COMPAZINE) 10 MG tablet Take 1 tablet (10 mg total) by mouth every 6 (six) hours as needed (Nausea or vomiting). (Patient taking differently: Take 10 mg by mouth daily. )     Allergies:   Patient has no known allergies.   Social History   Tobacco Use  . Smoking status: Never Smoker  . Smokeless tobacco: Never Used  Substance Use Topics  . Alcohol use: No  . Drug use: No     Family Hx: The patient's family history includes Congestive Heart Failure in her brother and brother; Heart failure in her father and mother; Tuberculosis in her maternal grandfather, maternal grandmother, and paternal uncle.  ROS:   Please see the history of present illness.     All other systems reviewed and are negative.   Prior CV studies:   The following studies were reviewed today:  Echocardiogram 07/02/2019:   1. Left ventricular ejection fraction, by visual estimation, is 60 to 65%. The left ventricle has normal function. There is no left ventricular hypertrophy.  2. Left ventricular diastolic parameters are consistent with Grade II diastolic dysfunction (pseudonormalization).  3. Global right ventricle has normal systolic function.The right ventricular size is normal. No increase in right ventricular Bouchie thickness.  4. Left atrial size was upper normal.  5. Right atrial size was normal.  6. Presence of pericardial fat pad.  7. Trivial pericardial effusion is present.  8.  The pericardial effusion is posterior to the left ventricle.  9. Mild aortic valve annular calcification. 10. The mitral valve is grossly normal. Trace mitral valve regurgitation. 11. The tricuspid valve is grossly normal. Tricuspid valve regurgitation is mild. 12. The aortic valve is tricuspid. Aortic valve regurgitation is not visualized. 13. The pulmonic valve was grossly normal. Pulmonic valve regurgitation is not visualized. 14. Mildly elevated pulmonary artery systolic pressure. 15. The inferior vena cava is normal in size with greater than 50% respiratory variability, suggesting right atrial pressure of 3 mmHg. 16. The tricuspid regurgitant velocity is 2.65 m/s, and with an assumed right atrial pressure of 3 mmHg, the estimated right ventricular systolic pressure is mildly elevated at 31.1 mmHg.  Labs/Other Tests and Data Reviewed:    EKG:  No ECG reviewed.  Recent Labs: 09/02/2019: ALT 32; Magnesium 2.6 09/07/2019: BUN 35; Creatinine, Ser 0.92; Hemoglobin 8.7; Platelets 198; Potassium 4.4; Sodium 137   Recent Lipid Panel No results found for: CHOL, TRIG, HDL, CHOLHDL, LDLCALC, LDLDIRECT  Wt  Readings from Last 3 Encounters:  09/01/19 122 lb (55.3 kg)  08/20/19 122 lb (55.3 kg)  07/30/19 125 lb 6.4 oz (56.9 kg)     Objective:    Vital Signs:  BP 129/77   Pulse 72   Temp 97.6 F (36.4 C)   Resp 18   SpO2 99%    VITAL SIGNS:  reviewed  ASSESSMENT & PLAN:    1.  Hypertension: Blood pressure is normal.  No changes to therapy.  2.  Grade 2 diastolic dysfunction: Blood pressure is normal.  She does not appear to require diuretic therapy.  3.  Hyperlipidemia: Currently on simvastatin 40 mg.   COVID-19 Education: The signs and symptoms of COVID-19 were discussed with the patient and how to seek care for testing (follow up with PCP or arrange E-visit).  The importance of social distancing was discussed today.  Time:   Today, I have spent 5 minutes with the patient with  telehealth technology discussing the above problems.     Medication Adjustments/Labs and Tests Ordered: Current medicines are reviewed at length with the patient today.  Concerns regarding medicines are outlined above.   Tests Ordered: No orders of the defined types were placed in this encounter.   Medication Changes: No orders of the defined types were placed in this encounter.   Follow Up:  Virtual Visit  prn  Signed, Kate Sable, MD  09/12/2019 9:00 AM    [place]

## 2019-09-12 NOTE — Patient Instructions (Signed)
Medication Instructions:   Your physician recommends that you continue on your current medications as directed. Please refer to the Current Medication list given to you today.  Labwork:  none  Testing/Procedures:  none  Follow-Up:  Your physician recommends that you schedule a follow-up appointment in: as needed.  Any Other Special Instructions Will Be Listed Below (If Applicable).  If you need a refill on your cardiac medications before your next appointment, please call your pharmacy. 

## 2019-09-16 DIAGNOSIS — D500 Iron deficiency anemia secondary to blood loss (chronic): Secondary | ICD-10-CM | POA: Diagnosis not present

## 2019-09-16 DIAGNOSIS — D509 Iron deficiency anemia, unspecified: Secondary | ICD-10-CM | POA: Diagnosis not present

## 2019-09-16 DIAGNOSIS — D649 Anemia, unspecified: Secondary | ICD-10-CM | POA: Diagnosis not present

## 2019-09-16 DIAGNOSIS — K5900 Constipation, unspecified: Secondary | ICD-10-CM | POA: Diagnosis not present

## 2019-09-16 DIAGNOSIS — N184 Chronic kidney disease, stage 4 (severe): Secondary | ICD-10-CM | POA: Diagnosis not present

## 2019-09-16 DIAGNOSIS — S72142D Displaced intertrochanteric fracture of left femur, subsequent encounter for closed fracture with routine healing: Secondary | ICD-10-CM | POA: Diagnosis not present

## 2019-09-16 DIAGNOSIS — R799 Abnormal finding of blood chemistry, unspecified: Secondary | ICD-10-CM | POA: Diagnosis not present

## 2019-09-17 DIAGNOSIS — D500 Iron deficiency anemia secondary to blood loss (chronic): Secondary | ICD-10-CM | POA: Diagnosis not present

## 2019-09-17 DIAGNOSIS — R339 Retention of urine, unspecified: Secondary | ICD-10-CM | POA: Diagnosis not present

## 2019-09-17 DIAGNOSIS — S72142D Displaced intertrochanteric fracture of left femur, subsequent encounter for closed fracture with routine healing: Secondary | ICD-10-CM | POA: Diagnosis not present

## 2019-09-18 DIAGNOSIS — Z4789 Encounter for other orthopedic aftercare: Secondary | ICD-10-CM | POA: Diagnosis not present

## 2019-09-19 DIAGNOSIS — R799 Abnormal finding of blood chemistry, unspecified: Secondary | ICD-10-CM | POA: Diagnosis not present

## 2019-09-19 DIAGNOSIS — D649 Anemia, unspecified: Secondary | ICD-10-CM | POA: Diagnosis not present

## 2019-09-19 DIAGNOSIS — N184 Chronic kidney disease, stage 4 (severe): Secondary | ICD-10-CM | POA: Diagnosis not present

## 2019-09-19 DIAGNOSIS — N1831 Chronic kidney disease, stage 3a: Secondary | ICD-10-CM | POA: Diagnosis not present

## 2019-09-19 DIAGNOSIS — S72142D Displaced intertrochanteric fracture of left femur, subsequent encounter for closed fracture with routine healing: Secondary | ICD-10-CM | POA: Diagnosis not present

## 2019-09-23 DIAGNOSIS — C50912 Malignant neoplasm of unspecified site of left female breast: Secondary | ICD-10-CM | POA: Diagnosis not present

## 2019-09-23 DIAGNOSIS — S72142D Displaced intertrochanteric fracture of left femur, subsequent encounter for closed fracture with routine healing: Secondary | ICD-10-CM | POA: Diagnosis not present

## 2019-09-23 DIAGNOSIS — D500 Iron deficiency anemia secondary to blood loss (chronic): Secondary | ICD-10-CM | POA: Diagnosis not present

## 2019-09-23 DIAGNOSIS — I10 Essential (primary) hypertension: Secondary | ICD-10-CM | POA: Diagnosis not present

## 2019-09-24 ENCOUNTER — Ambulatory Visit (HOSPITAL_COMMUNITY): Payer: Medicare Other | Admitting: Hematology

## 2019-10-02 ENCOUNTER — Inpatient Hospital Stay (HOSPITAL_COMMUNITY): Payer: Medicare Other | Attending: Hematology | Admitting: Hematology

## 2019-10-02 ENCOUNTER — Inpatient Hospital Stay (HOSPITAL_COMMUNITY): Payer: Medicare Other

## 2019-10-02 ENCOUNTER — Other Ambulatory Visit: Payer: Self-pay

## 2019-10-02 ENCOUNTER — Encounter (HOSPITAL_COMMUNITY): Payer: Self-pay | Admitting: Hematology

## 2019-10-02 VITALS — BP 117/52 | HR 66 | Temp 97.1°F | Resp 20 | Wt 116.9 lb

## 2019-10-02 DIAGNOSIS — Z87442 Personal history of urinary calculi: Secondary | ICD-10-CM | POA: Insufficient documentation

## 2019-10-02 DIAGNOSIS — R5383 Other fatigue: Secondary | ICD-10-CM | POA: Insufficient documentation

## 2019-10-02 DIAGNOSIS — Z836 Family history of other diseases of the respiratory system: Secondary | ICD-10-CM | POA: Diagnosis not present

## 2019-10-02 DIAGNOSIS — Z171 Estrogen receptor negative status [ER-]: Secondary | ICD-10-CM | POA: Diagnosis not present

## 2019-10-02 DIAGNOSIS — C50912 Malignant neoplasm of unspecified site of left female breast: Secondary | ICD-10-CM

## 2019-10-02 DIAGNOSIS — R918 Other nonspecific abnormal finding of lung field: Secondary | ICD-10-CM | POA: Diagnosis not present

## 2019-10-02 DIAGNOSIS — S72002A Fracture of unspecified part of neck of left femur, initial encounter for closed fracture: Secondary | ICD-10-CM | POA: Diagnosis not present

## 2019-10-02 DIAGNOSIS — N3281 Overactive bladder: Secondary | ICD-10-CM | POA: Insufficient documentation

## 2019-10-02 DIAGNOSIS — R32 Unspecified urinary incontinence: Secondary | ICD-10-CM | POA: Diagnosis not present

## 2019-10-02 DIAGNOSIS — M79605 Pain in left leg: Secondary | ICD-10-CM | POA: Insufficient documentation

## 2019-10-02 DIAGNOSIS — W19XXXA Unspecified fall, initial encounter: Secondary | ICD-10-CM | POA: Insufficient documentation

## 2019-10-02 DIAGNOSIS — Z5112 Encounter for antineoplastic immunotherapy: Secondary | ICD-10-CM | POA: Insufficient documentation

## 2019-10-02 DIAGNOSIS — Z8249 Family history of ischemic heart disease and other diseases of the circulatory system: Secondary | ICD-10-CM | POA: Insufficient documentation

## 2019-10-02 DIAGNOSIS — I10 Essential (primary) hypertension: Secondary | ICD-10-CM | POA: Diagnosis not present

## 2019-10-02 DIAGNOSIS — C778 Secondary and unspecified malignant neoplasm of lymph nodes of multiple regions: Secondary | ICD-10-CM | POA: Diagnosis not present

## 2019-10-02 DIAGNOSIS — Z79899 Other long term (current) drug therapy: Secondary | ICD-10-CM | POA: Diagnosis not present

## 2019-10-02 DIAGNOSIS — R42 Dizziness and giddiness: Secondary | ICD-10-CM | POA: Diagnosis not present

## 2019-10-02 DIAGNOSIS — C50812 Malignant neoplasm of overlapping sites of left female breast: Secondary | ICD-10-CM | POA: Diagnosis not present

## 2019-10-02 LAB — FOLATE: Folate: 9.9 ng/mL (ref >5.9)

## 2019-10-02 LAB — COMPREHENSIVE METABOLIC PANEL
ALT: 20 U/L (ref 0–44)
AST: 30 U/L (ref 15–41)
Albumin: 3.7 g/dL (ref 3.5–5.0)
Alkaline Phosphatase: 125 U/L (ref 38–126)
Anion gap: 10 (ref 5–15)
BUN: 27 mg/dL — ABNORMAL HIGH (ref 8–23)
CO2: 23 mmol/L (ref 22–32)
Calcium: 9 mg/dL (ref 8.9–10.3)
Chloride: 108 mmol/L (ref 98–111)
Creatinine, Ser: 1.14 mg/dL — ABNORMAL HIGH (ref 0.44–1.00)
GFR calc Af Amer: 48 mL/min — ABNORMAL LOW (ref >60)
GFR calc non Af Amer: 42 mL/min — ABNORMAL LOW (ref >60)
Glucose, Bld: 124 mg/dL — ABNORMAL HIGH (ref 70–99)
Potassium: 3.9 mmol/L (ref 3.5–5.1)
Sodium: 141 mmol/L (ref 135–145)
Total Bilirubin: 0.9 mg/dL (ref 0.3–1.2)
Total Protein: 6.1 g/dL — ABNORMAL LOW (ref 6.5–8.1)

## 2019-10-02 LAB — CBC WITH DIFFERENTIAL/PLATELET
Abs Immature Granulocytes: 0.03 10*3/uL (ref 0.00–0.07)
Basophils Absolute: 0 10*3/uL (ref 0.0–0.1)
Basophils Relative: 1 %
Eosinophils Absolute: 0.1 10*3/uL (ref 0.0–0.5)
Eosinophils Relative: 2 %
HCT: 35.7 % — ABNORMAL LOW (ref 36.0–46.0)
Hemoglobin: 11.3 g/dL — ABNORMAL LOW (ref 12.0–15.0)
Immature Granulocytes: 0 %
Lymphocytes Relative: 16 %
Lymphs Abs: 1.1 10*3/uL (ref 0.7–4.0)
MCH: 31.2 pg (ref 26.0–34.0)
MCHC: 31.7 g/dL (ref 30.0–36.0)
MCV: 98.6 fL (ref 80.0–100.0)
Monocytes Absolute: 0.4 10*3/uL (ref 0.1–1.0)
Monocytes Relative: 6 %
Neutro Abs: 5.1 10*3/uL (ref 1.7–7.7)
Neutrophils Relative %: 75 %
Platelets: 217 10*3/uL (ref 150–400)
RBC: 3.62 MIL/uL — ABNORMAL LOW (ref 3.87–5.11)
RDW: 15.9 % — ABNORMAL HIGH (ref 11.5–15.5)
WBC: 6.7 10*3/uL (ref 4.0–10.5)
nRBC: 0 % (ref 0.0–0.2)

## 2019-10-02 LAB — FERRITIN: Ferritin: 147 ng/mL (ref 11–307)

## 2019-10-02 LAB — IRON AND TIBC
Iron: 84 ug/dL (ref 28–170)
Saturation Ratios: 27 % (ref 10.4–31.8)
TIBC: 309 ug/dL (ref 250–450)
UIBC: 225 ug/dL

## 2019-10-02 LAB — VITAMIN B12: Vitamin B-12: 292 pg/mL (ref 180–914)

## 2019-10-02 NOTE — Progress Notes (Signed)
[place], [place] [address]   CLINIC:  Medical Oncology/Hematology  PCP:  [name], MD [place] [address] [phone]   REASON FOR VISIT:  Follow-up for Breast Cancer   CURRENT THERAPY: Kadcyla.  BRIEF ONCOLOGIC HISTORY:  Oncology History  Invasive ductal carcinoma of left breast ([place])  03/30/2017 Initial Diagnosis   Invasive ductal carcinoma of left breast ([place])   08/09/2018 - 03/20/2019 Chemotherapy   The patient had trastuzumab (HERCEPTIN) 500 mg in sodium chloride 0.9 % 250 mL chemo infusion, 525 mg, Intravenous,  Once, 10 of 10 cycles Administration: 500 mg (08/09/2018), 350 mg (09/06/2018), 350 mg (09/27/2018), 378 mg (10/18/2018), 378 mg (11/15/2018), 378 mg (12/06/2018), 378 mg (12/28/2018), 378 mg (01/18/2019), 378 mg (02/08/2019) pertuzumab (PERJETA) 840 mg in sodium chloride 0.9 % 250 mL chemo infusion, 840 mg (100 % of original dose 840 mg), Intravenous, Once, 6 of 6 cycles Dose modification: 840 mg (original dose 840 mg, Cycle 5), 420 mg (original dose 420 mg, Cycle 6) Administration: 840 mg (11/15/2018), 420 mg (12/06/2018), 420 mg (12/28/2018), 420 mg (01/18/2019), 420 mg (02/08/2019)  for chemotherapy treatment.    09/18/2018 Genetic Testing   Negative genetic testing for the breast and gyn cancer panel.  The Breast/GYN gene panel offered by [facility] includes sequencing and rearrangement analysis for the following 23 genes:  ATM, BRCA1, BRCA2, BRIP1, CDH1, CHEK2, EPCAM, MLH1, MSH2, MSH6, NBN, NF1, PALB2, PMS2, PTEN, RAD51C, RAD51D, STK11, and TP53.   The report date is 09/18/2018.   03/12/2019 -  Chemotherapy   The patient had ado-trastuzumab emtansine (KADCYLA) 140 mg in sodium chloride 0.9 % 250 mL chemo infusion, 2.4 mg/kg = 140 mg (100 % of original dose 2.4 mg/kg), Intravenous, Once, 8 of 9 cycles Dose modification: 2.4 mg/kg (original dose 2.4 mg/kg, Cycle 1, Reason: Patient Age), 3 mg/kg (original dose 2.4  mg/kg, Cycle 3, Reason: Provider Judgment) Administration: 140 mg (03/12/2019), 180 mg (04/23/2019), 180 mg (06/18/2019), 180 mg (04/02/2019), 180 mg (05/28/2019), 180 mg (07/09/2019), 180 mg (07/30/2019), 180 mg (08/20/2019)  for chemotherapy treatment.       CANCER STAGING: Cancer Staging No matching staging information was found for the patient.   INTERVAL HISTORY:  Ms. Stegner 86 y.o. female seen for follow-up for her metastatic HER-2 positive left breast cancer.  Unfortunately she fell and broke her left hip and was hospitalized from 09/01/2019 through 09/07/2019.  She had a left hip open reduction internal fixation on 09/03/2019.  She had required 1 unit of PRBC transfusion.  Last Kadcyla was on 08/20/2019.  Last PET scan was on 06/10/2019.  She was released from rehab facility to home about a week ago.  She is ambulating with help of walker.  She is accompanied by her daughter today.  She has not started cooking it.  Denies any symptoms of PND or orthopnea.  Appetite is 75%.  Energy levels are still low.  REVIEW OF SYSTEMS:  Review of Systems  Constitutional: Positive for fatigue.  Psychiatric/Behavioral: Positive for sleep disturbance.  All other systems reviewed and are negative.    PAST MEDICAL/SURGICAL HISTORY:  Past Medical History:  Diagnosis Date  . Cancer ([place])    left breast  . History of kidney stones   . Hypercholesteremia   . Hypertension    Past Surgical History:  Procedure Laterality Date  . APPENDECTOMY    . brain cyst removed  2011  . BREAST BIOPSY Left 03/28/2018  Procedure: BREAST BIOPSY;  Surgeon: [name], MD;  Location: [facility];  Service: General;  Laterality: Left;  . CATARACT EXTRACTION W/PHACO Left 11/09/2015   Procedure: CATARACT EXTRACTION PHACO AND INTRAOCULAR LENS PLACEMENT LEFT EYE cde=8.97;  Surgeon: [name], MD;  Location: [facility];  Service: Ophthalmology;  Laterality: Left;  . CATARACT EXTRACTION W/PHACO Right 02/04/2019   Procedure: CATARACT  EXTRACTION PHACO AND INTRAOCULAR LENS PLACEMENT (IOC);  Surgeon: [name], MD;  Location: [facility];  Service: Ophthalmology;  Laterality: Right;  CDE: 15.19  . EYE SURGERY     KPE left  . MASTECTOMY MODIFIED RADICAL Left 05/28/2018   Procedure: LEFT MODIFIED RADICAL MASTECTOMY;  Surgeon: [name], MD;  Location: [facility];  Service: General;  Laterality: Left;  Marland Kitchen MASTECTOMY, PARTIAL Left 04/24/2017   Procedure: MASTECTOMY PARTIAL;  Surgeon: [name], MD;  Location: [facility];  Service: General;  Laterality: Left;  . ORIF FEMUR FRACTURE Left 09/03/2019   Procedure: OPEN REDUCTION INTERNAL FIXATION (ORIF) LEFT HIP FEMUR FRACTURE;  Surgeon: Paralee Cancel, MD;  Location: [facility];  Service: Orthopedics;  Laterality: Left;  . PORTACATH PLACEMENT Right 08/08/2018   Procedure: INSERTION PORT-A-CATH;  Surgeon: [name], MD;  Location: [facility];  Service: General;  Laterality: Right;  . TONSILLECTOMY    . TONSILLECTOMY AND ADENOIDECTOMY       SOCIAL HISTORY:  Social History   Socioeconomic History  . Marital status: Widowed    Spouse name: Not on file  . Number of children: Not on file  . Years of education: Not on file  . Highest education level: Not on file  Occupational History  . Not on file  Tobacco Use  . Smoking status: Never Smoker  . Smokeless tobacco: Never Used  Substance and Sexual Activity  . Alcohol use: No  . Drug use: No  . Sexual activity: Not Currently    Partners: Male  Other Topics Concern  . Not on file  Social History Narrative  . Not on file   Social Determinants of Health   Financial Resource Strain:   . Difficulty of Paying Living Expenses: Not on file  Food Insecurity:   . Worried About Charity fundraiser in the Last Year: Not on file  . Ran Out of Food in the Last Year: Not on file  Transportation Needs:   . Lack of Transportation (Medical): Not on file  . Lack of Transportation (Non-Medical): Not on file  Physical Activity:   . Days of  Exercise per Week: Not on file  . Minutes of Exercise per Session: Not on file  Stress:   . Feeling of Stress : Not on file  Social Connections:   . Frequency of Communication with Friends and Family: Not on file  . Frequency of Social Gatherings with Friends and Family: Not on file  . Attends Religious Services: Not on file  . Active Member of Clubs or Organizations: Not on file  . Attends Archivist Meetings: Not on file  . Marital Status: Not on file  Intimate Partner Violence:   . Fear of Current or Ex-Partner: Not on file  . Emotionally Abused: Not on file  . Physically Abused: Not on file  . Sexually Abused: Not on file    FAMILY HISTORY:  Family History  Problem Relation Age of Onset  . Heart failure Mother   . Heart failure Father   . Congestive Heart Failure Brother   . Tuberculosis Paternal Uncle   . Tuberculosis  Maternal Grandmother   . Tuberculosis Maternal Grandfather   . Congestive Heart Failure Brother     CURRENT MEDICATIONS:  Outpatient Encounter Medications as of 10/02/2019  Medication Sig  . Ado-Trastuzumab Emtansine (KADCYLA IV) Inject into the vein every 21 ( twenty-one) days.  . ALPRAZolam (XANAX) 0.5 MG tablet Take 1 tablet 30 min before PET  . amLODipine (NORVASC) 5 MG tablet Take 5 mg by mouth daily.  Marland Kitchen aspirin 325 MG EC tablet Take 1 tablet (325 mg total) by mouth 2 (two) times daily for 28 days. Then resume normal dose of Aspirin 81 mg twice daily.  . ferrous sulfate 325 (65 FE) MG tablet Take 325 mg by mouth daily with breakfast.  . HYDROcodone-acetaminophen (NORCO/VICODIN) 5-325 MG tablet Take 1 tablet by mouth every 6 (six) hours as needed for moderate pain or severe pain.  Marland Kitchen labetalol (NORMODYNE) 100 MG tablet Take 1 tablet (100 mg total) by mouth 2 (two) times daily.  . methocarbamol (ROBAXIN) 500 MG tablet Take 1 tablet (500 mg total) by mouth every 6 (six) hours as needed for muscle spasms.  Marland Kitchen oxybutynin (DITROPAN) 5 MG tablet Take 5  mg by mouth daily.   . simvastatin (ZOCOR) 40 MG tablet Take 40 mg by mouth daily.  Marland Kitchen zolpidem (AMBIEN) 5 MG tablet Take 1 tablet (5 mg total) by mouth at bedtime as needed for sleep.  . [DISCONTINUED] prochlorperazine (COMPAZINE) 10 MG tablet Take 1 tablet (10 mg total) by mouth every 6 (six) hours as needed (Nausea or vomiting). (Patient taking differently: Take 10 mg by mouth daily. )   No facility-administered encounter medications on file as of 10/02/2019.    ALLERGIES:  No Known Allergies   PHYSICAL EXAM:  ECOG Performance status: 1  Vitals:   10/02/19 1040  BP: (!) 117/52  Pulse: 66  Resp: 20  Temp: (!) 97.1 F (36.2 C)  SpO2: 99%   Filed Weights   10/02/19 1040  Weight: 116 lb 14.4 oz (53 kg)    Physical Exam Constitutional:      Appearance: Normal appearance.  HENT:     Head: Normocephalic.     Nose: Nose normal.  Eyes:     Conjunctiva/sclera: Conjunctivae normal.  Cardiovascular:     Rate and Rhythm: Normal rate and regular rhythm.     Pulses: Normal pulses.     Heart sounds: Normal heart sounds.  Pulmonary:     Effort: Pulmonary effort is normal.     Breath sounds: Normal breath sounds.  Abdominal:     General: Bowel sounds are normal.  Musculoskeletal:        General: Normal range of motion.     Cervical back: Normal range of motion.  Skin:    General: Skin is warm and dry.  Neurological:     General: No focal deficit present.     Mental Status: She is alert and oriented to person, place, and time.  Psychiatric:        Mood and Affect: Mood normal.        Behavior: Behavior normal.        Thought Content: Thought content normal.        Judgment: Judgment normal.      LABORATORY DATA:  I have reviewed the labs as listed.  CBC    Component Value Date/Time   WBC 6.7 10/02/2019 1145   RBC 3.62 (L) 10/02/2019 1145   HGB 11.3 (L) 10/02/2019 1145   HCT 35.7 (L) 10/02/2019 1145  PLT 217 10/02/2019 1145   MCV 98.6 10/02/2019 1145   MCH 31.2  10/02/2019 1145   MCHC 31.7 10/02/2019 1145   RDW 15.9 (H) 10/02/2019 1145   LYMPHSABS 1.1 10/02/2019 1145   MONOABS 0.4 10/02/2019 1145   EOSABS 0.1 10/02/2019 1145   BASOSABS 0.0 10/02/2019 1145   CMP Latest Ref Rng & Units 10/02/2019 09/07/2019 09/04/2019  Glucose 70 - 99 mg/dL 124(H) 130(H) 190(H)  BUN 8 - 23 mg/dL 27(H) 35(H) 32(H)  Creatinine 0.44 - 1.00 mg/dL 1.14(H) 0.92 1.09(H)  Sodium 135 - 145 mmol/L 141 137 134(L)  Potassium 3.5 - 5.1 mmol/L 3.9 4.4 4.9  Chloride 98 - 111 mmol/L 108 104 102  CO2 22 - 32 mmol/L '23 25 24  ' Calcium 8.9 - 10.3 mg/dL 9.0 8.4(L) 8.5(L)  Total Protein 6.5 - 8.1 g/dL 6.1(L) - -  Total Bilirubin 0.3 - 1.2 mg/dL 0.9 - -  Alkaline Phos 38 - 126 U/L 125 - -  AST 15 - 41 U/L 30 - -  ALT 0 - 44 U/L 20 - -   I have independently reviewed her PET CT scan and discussed with the patient.   ASSESSMENT & PLAN:   Invasive ductal carcinoma of left breast ([place]) 1.  Metastatic HER-2 positive left breast cancer: -Left modified radical mastectomy on 05/28/2018, ER/PR negative, HER-2 positive, 3 out of 3 lymph nodes positive -PET scan on 07/23/2018 shows few scattered mildly hypermetabolic pulmonary nodules, hypermetabolic left axillary, right paratracheal, right hilar, subcarinal adenopathy compatible with metastatic disease. -4 cycles of single agent Herceptin every 3 weeks from 08/09/2018 through 10/18/2018 with progression. -Perjeta and Herceptin from 11/15/2018 through 02/16/2027 PET scan on 02/25/2019 showing progression. -8 cycles of Kadcyla from 03/12/2019 through 08/20/2019. -PET scan on 06/10/2019 showed increased hypermetabolism in the thoracic lymph nodes, similar size.  Pulmonary nodules have stable hypermetabolism but slight increase in size, largest measuring 0.3 cm.  Continuation of therapy was recommended. -She fell in the bathroom and broke her left hip.  Hence her treatments were held. -She came home from rehab last week.  She is walking with help of  walker. -I have recommended restaging CT scan of the chest, abdomen and pelvis.  We will also obtain tumor markers. -We will see her back after the scans to discuss further treatment plan.  2.  Left hip fracture: -She was hospitalized from 09/01/2019 through 09/07/2019 after a fall. -She underwent left hip ORIF on 09/03/2019. -She was released from rehab about a week ago. -She is accompanied by her daughter today.  She is ambulating with help of walker.  Slowly strength is getting back to normal.  3.  High risk drug monitoring: -Last echo from 07/02/2019 showed EF 60-65%.    Total time spent is 30 minutes with more than 50% of the time spent face-to-face reviewing hospital records, discussing further work-up and management and coordination of care.   Orders placed this encounter:  Orders Placed This Encounter  Procedures  . CBC with Differential/Platelet  . Comprehensive metabolic panel  . Iron and TIBC  . Ferritin  . Vitamin B12  . Folate  . Cancer antigen 15-3     Derek Jack, MD  [place] [phone]

## 2019-10-02 NOTE — Assessment & Plan Note (Signed)
1.  Metastatic HER-2 positive left breast cancer: -Left modified radical mastectomy on 05/28/2018, ER/PR negative, HER-2 positive, 3 out of 3 lymph nodes positive -PET scan on 07/23/2018 shows few scattered mildly hypermetabolic pulmonary nodules, hypermetabolic left axillary, right paratracheal, right hilar, subcarinal adenopathy compatible with metastatic disease. -4 cycles of single agent Herceptin every 3 weeks from 08/09/2018 through 10/18/2018 with progression. -Perjeta and Herceptin from 11/15/2018 through 02/16/2027 PET scan on 02/25/2019 showing progression. -8 cycles of Kadcyla from 03/12/2019 through 08/20/2019. -PET scan on 06/10/2019 showed increased hypermetabolism in the thoracic lymph nodes, similar size.  Pulmonary nodules have stable hypermetabolism but slight increase in size, largest measuring 0.3 cm.  Continuation of therapy was recommended. -She fell in the bathroom and broke her left hip.  Hence her treatments were held. -She came home from rehab last week.  She is walking with help of walker. -I have recommended restaging CT scan of the chest, abdomen and pelvis.  We will also obtain tumor markers. -We will see her back after the scans to discuss further treatment plan.  2.  Left hip fracture: -She was hospitalized from 09/01/2019 through 09/07/2019 after a fall. -She underwent left hip ORIF on 09/03/2019. -She was released from rehab about a week ago. -She is accompanied by her daughter today.  She is ambulating with help of walker.  Slowly strength is getting back to normal.  3.  High risk drug monitoring: -Last echo from 07/02/2019 showed EF 60-65%.

## 2019-10-02 NOTE — Patient Instructions (Addendum)
[place] at [facility] Discharge Instructions  You were seen today by Dr. Delton Coombes. He went over your recent lab results. He will have blood work drawn today. He will schedule you for repeat scans prior to your next visit. He will see you back after your scans for follow up.   Thank you for choosing [place] at [facility] to provide your oncology and hematology care.  To afford each patient quality time with our provider, please arrive at least 15 minutes before your scheduled appointment time.   If you have a lab appointment with the [facility] please come in thru the  Main Entrance and check in at the main information desk  You need to re-schedule your appointment should you arrive 10 or more minutes late.  We strive to give you quality time with our providers, and arriving late affects you and other patients whose appointments are after yours.  Also, if you no show three or more times for appointments you may be dismissed from the clinic at the providers discretion.     Again, thank you for choosing [facility]  Our hope is that these requests will decrease the amount of time that you wait before being seen by our physicians.       _____________________________________________________________  Should you have questions after your visit to [facility], please contact our office at [phone] between the hours of 8:00 a.m. and 4:30 p.m.  Voicemails left after 4:00 p.m. will not be returned until the following business day.  For prescription refill requests, have your pharmacy contact our office and allow 72 hours.    Cancer Center Support Programs:   > Cancer Support Group  2nd Tuesday of the month 1pm-2pm, Journey Room

## 2019-10-03 LAB — CANCER ANTIGEN 15-3: CA 15-3: 8.2 U/mL (ref 0.0–25.0)

## 2019-10-09 ENCOUNTER — Other Ambulatory Visit (HOSPITAL_COMMUNITY): Payer: Self-pay | Admitting: Nurse Practitioner

## 2019-10-09 DIAGNOSIS — C50912 Malignant neoplasm of unspecified site of left female breast: Secondary | ICD-10-CM

## 2019-10-09 MED ORDER — ZOLPIDEM TARTRATE 5 MG PO TABS: 5.0000 mg | ORAL_TABLET | Freq: Every evening | ORAL | 0 refills | Status: DC | PRN

## 2019-10-14 ENCOUNTER — Other Ambulatory Visit: Payer: Self-pay

## 2019-10-14 ENCOUNTER — Ambulatory Visit (HOSPITAL_COMMUNITY)
Admission: RE | Admit: 2019-10-14 | Discharge: 2019-10-14 | Disposition: A | Discharge status: Home / Self Care | Payer: Medicare Other | Source: Ambulatory Visit | Attending: Hematology | Admitting: Hematology

## 2019-10-14 DIAGNOSIS — Z170 Estrogen receptor positive status [ER+]: Secondary | ICD-10-CM | POA: Diagnosis not present

## 2019-10-14 DIAGNOSIS — C50512 Malignant neoplasm of lower-outer quadrant of left female breast: Secondary | ICD-10-CM | POA: Insufficient documentation

## 2019-10-14 DIAGNOSIS — R918 Other nonspecific abnormal finding of lung field: Secondary | ICD-10-CM | POA: Diagnosis not present

## 2019-10-14 DIAGNOSIS — Z5111 Encounter for antineoplastic chemotherapy: Secondary | ICD-10-CM | POA: Diagnosis not present

## 2019-10-14 DIAGNOSIS — C50912 Malignant neoplasm of unspecified site of left female breast: Secondary | ICD-10-CM | POA: Diagnosis not present

## 2019-10-14 MED ORDER — IOHEXOL 300 MG/ML  SOLN
75.0000 mL | Freq: Once | INTRAMUSCULAR | Status: AC | PRN
  Administered 2019-10-14: 12:00:00 75 mL via INTRAVENOUS

## 2019-10-17 ENCOUNTER — Inpatient Hospital Stay (HOSPITAL_BASED_OUTPATIENT_CLINIC_OR_DEPARTMENT_OTHER): Payer: Medicare Other | Admitting: Hematology

## 2019-10-17 DIAGNOSIS — Z170 Estrogen receptor positive status [ER+]: Secondary | ICD-10-CM

## 2019-10-17 DIAGNOSIS — C50512 Malignant neoplasm of lower-outer quadrant of left female breast: Secondary | ICD-10-CM | POA: Diagnosis not present

## 2019-10-17 NOTE — Progress Notes (Signed)
ON PATHWAY REGIMEN - Breast  No Change  Continue With Treatment as Ordered.     A cycle is every 21 days:     Ado-trastuzumab emtansine   **Always confirm dose/schedule in your pharmacy ordering system**  Patient Characteristics: Distant Metastases or Locoregional Recurrent Disease - Unresected or Locally Advanced Unresectable Disease Progressing after Neoadjuvant and Local Therapies, HER2 Positive, ER Negative/Unknown, Chemotherapy, Second Line Therapeutic Status: Locally Advanced Disease - Unresectable and Progressing after Neoadjuvant and Local Therapies BRCA Mutation Status: Did Not Order Test ER Status: Negative (-) HER2 Status: Positive (+) PR Status: Negative (-) Line of Therapy: Second Line Intent of Therapy: Non-Curative / Palliative Intent, Discussed with Patient

## 2019-10-17 NOTE — Progress Notes (Signed)
Virtual Visit via Telephone Note  I connected with [employer] on 10/17/19 at 11:45 AM EST by telephone and verified that I am speaking with the correct person using two identifiers.   I discussed the limitations, risks, security and privacy concerns of performing an evaluation and management service by telephone and the availability of in person appointments. I also discussed with the patient that there may be a patient responsible charge related to this service. The patient expressed understanding and agreed to proceed.   History of Present Illness: She is seen in our clinic for metastatic HER-2 positive left breast cancer.  Left MRM on 05/28/2018, ER/PR negative, HER-2 positive, 3 out of 3 lymph nodes positive.  PET scan on 07/23/2018 showed few scattered mildly hypermetabolic pulmonary nodules, hypermetabolic left axillary, right paratracheal right hilar and subcarinal adenopathy compatible with metastatic disease.  She received Perjeta and Herceptin from 11/15/2018 through 02/16/2019 with progression.  She also received 8 cycles of Kadcyla from 03/12/2019 through 08/20/2019 with progression.   Observations/Objective: She is recovering from her hip surgery.  She is using walker for ambulation.  She reports pain in her left leg, very similar to the pain she had prior to her hip fracture.  She was seen by orthopedics and [place] and received injection at that time which helped.  She also complains of urinary incontinence only when she stands up.  She is currently on oxybutynin 5 mg daily.  Denies any dysuria, chills or fever.  Denies any other pains.  Breathing has been stable.  Denies any PND or orthopnea.  Assessment and Plan:  1.  Metastatic HER-2 positive left breast cancer: -8 cycles of Kadcyla from 03/12/2019 through 08/20/2019. -We reviewed CT CAP from 10/14/2019.  I have independently reviewed the images of the CT and compared it with the prior PET scan.  There is enlarging bilateral  pulmonary nodules and enlarging left infrahilar mass.  The infrahilar mass increased by at least 1 cm.  This qualifies as progression.  Her CA 15-3 has always been normal.  Left axillary lymph nodes are similar.  Multiple small lung nodules have also progressed.  No metastatic disease in the abdomen or pelvis. -Based on the findings, I have recommended change in treatment.  We talked about starting her on Enhertu once every 3 weeks until disease progression or unacceptable toxicity. -We talked about pulmonary toxicity with interstitial lung disease/pneumonitis which is a peculiar side effect of this medication.  We will closely monitor her lung function.  We also talked about rare chance of infusion reaction, cardiotoxicity with left ventricular dysfunction and cytopenias.  Last echocardiogram was in November 2020 which showed normal ejection fraction.  We will likely get 1 more echocardiogram in the next month. -We will likely proceed with her treatment next week.  2.  Urinary incontinence: -She reports urine leaking when she stands up every time.  She is on oxybutynin 5 mg daily.  Denies any dysuria, fever or chills. -We will check a UA at next visit in the office. -If there is no sign of infection, will consider discontinuing oxybutynin and starting Myrbetriq 25 mg daily.  3.  Left leg pain: -This is similar to the pain she experienced prior to her left hip fracture.  MRI at that time showed lot of degenerative changes. -She was told to reach out to orthopedic doctor in [place] for injection.  She already had 1 injection few months ago which helped.   Follow Up Instructions: RTC next week to  start treatment.   I discussed the assessment and treatment plan with the patient. The patient was provided an opportunity to ask questions and all were answered. The patient agreed with the plan and demonstrated an understanding of the instructions.   The patient was advised to call back or seek an  in-person evaluation if the symptoms worsen or if the condition fails to improve as anticipated.  I provided 22 minutes of non-face-to-face time during this encounter.   Derek Jack, MD

## 2019-10-17 NOTE — Progress Notes (Signed)
DISCONTINUE ON PATHWAY REGIMEN - Breast     A cycle is every 21 days:     Ado-trastuzumab emtansine   **Always confirm dose/schedule in your pharmacy ordering system**  REASON: Disease Progression PRIOR TREATMENT: [id number]: Ado-trastuzumab Emtansine 3.6 mg/kg q21 Days TREATMENT RESPONSE: Progressive Disease (PD)  START ON PATHWAY REGIMEN - Breast     A cycle is every 21 days:     Fam-trastuzumab deruxtecan-nxki   **Always confirm dose/schedule in your pharmacy ordering system**  Patient Characteristics: Distant Metastases or Locoregional Recurrent Disease - Unresected or Locally Advanced Unresectable Disease Progressing after Neoadjuvant and Local Therapies, HER2 Positive, ER Negative/Unknown, Chemotherapy, Third Line Therapeutic Status: Distant Metastases BRCA Mutation Status: Did Not Order Test ER Status: Negative (-) HER2 Status: Positive (+) PR Status: Negative (-) Line of Therapy: Third Line Intent of Therapy: Non-Curative / Palliative Intent, Discussed with Patient 

## 2019-10-22 ENCOUNTER — Encounter (HOSPITAL_COMMUNITY): Payer: Self-pay | Admitting: *Deleted

## 2019-10-22 ENCOUNTER — Inpatient Hospital Stay (HOSPITAL_COMMUNITY): Payer: Medicare Other

## 2019-10-22 ENCOUNTER — Encounter (HOSPITAL_COMMUNITY): Payer: Self-pay

## 2019-10-22 ENCOUNTER — Inpatient Hospital Stay (HOSPITAL_BASED_OUTPATIENT_CLINIC_OR_DEPARTMENT_OTHER): Payer: Medicare Other | Admitting: Hematology

## 2019-10-22 ENCOUNTER — Other Ambulatory Visit: Payer: Self-pay

## 2019-10-22 VITALS — BP 118/47 | HR 54 | Temp 96.8°F | Resp 18 | Wt 114.2 lb

## 2019-10-22 VITALS — BP 119/48 | HR 57 | Temp 97.2°F | Resp 18

## 2019-10-22 DIAGNOSIS — C778 Secondary and unspecified malignant neoplasm of lymph nodes of multiple regions: Secondary | ICD-10-CM | POA: Diagnosis not present

## 2019-10-22 DIAGNOSIS — I10 Essential (primary) hypertension: Secondary | ICD-10-CM | POA: Diagnosis not present

## 2019-10-22 DIAGNOSIS — Z171 Estrogen receptor negative status [ER-]: Secondary | ICD-10-CM | POA: Diagnosis not present

## 2019-10-22 DIAGNOSIS — Z79899 Other long term (current) drug therapy: Secondary | ICD-10-CM | POA: Diagnosis not present

## 2019-10-22 DIAGNOSIS — C50912 Malignant neoplasm of unspecified site of left female breast: Secondary | ICD-10-CM

## 2019-10-22 DIAGNOSIS — Z5112 Encounter for antineoplastic immunotherapy: Secondary | ICD-10-CM | POA: Diagnosis not present

## 2019-10-22 DIAGNOSIS — Z836 Family history of other diseases of the respiratory system: Secondary | ICD-10-CM | POA: Diagnosis not present

## 2019-10-22 DIAGNOSIS — N3941 Urge incontinence: Secondary | ICD-10-CM | POA: Diagnosis not present

## 2019-10-22 DIAGNOSIS — N3281 Overactive bladder: Secondary | ICD-10-CM | POA: Diagnosis not present

## 2019-10-22 DIAGNOSIS — Z87442 Personal history of urinary calculi: Secondary | ICD-10-CM | POA: Diagnosis not present

## 2019-10-22 DIAGNOSIS — R5383 Other fatigue: Secondary | ICD-10-CM | POA: Diagnosis not present

## 2019-10-22 DIAGNOSIS — R918 Other nonspecific abnormal finding of lung field: Secondary | ICD-10-CM | POA: Diagnosis not present

## 2019-10-22 DIAGNOSIS — R42 Dizziness and giddiness: Secondary | ICD-10-CM | POA: Diagnosis not present

## 2019-10-22 DIAGNOSIS — S72002A Fracture of unspecified part of neck of left femur, initial encounter for closed fracture: Secondary | ICD-10-CM | POA: Diagnosis not present

## 2019-10-22 DIAGNOSIS — M79605 Pain in left leg: Secondary | ICD-10-CM | POA: Diagnosis not present

## 2019-10-22 DIAGNOSIS — C50812 Malignant neoplasm of overlapping sites of left female breast: Secondary | ICD-10-CM | POA: Diagnosis not present

## 2019-10-22 DIAGNOSIS — Z8249 Family history of ischemic heart disease and other diseases of the circulatory system: Secondary | ICD-10-CM | POA: Diagnosis not present

## 2019-10-22 LAB — COMPREHENSIVE METABOLIC PANEL
ALT: 15 U/L (ref 0–44)
AST: 25 U/L (ref 15–41)
Albumin: 3.4 g/dL — ABNORMAL LOW (ref 3.5–5.0)
Alkaline Phosphatase: 88 U/L (ref 38–126)
Anion gap: 9 (ref 5–15)
BUN: 26 mg/dL — ABNORMAL HIGH (ref 8–23)
CO2: 24 mmol/L (ref 22–32)
Calcium: 9.1 mg/dL (ref 8.9–10.3)
Chloride: 106 mmol/L (ref 98–111)
Creatinine, Ser: 1.1 mg/dL — ABNORMAL HIGH (ref 0.44–1.00)
GFR calc Af Amer: 50 mL/min — ABNORMAL LOW (ref >60)
GFR calc non Af Amer: 44 mL/min — ABNORMAL LOW (ref >60)
Glucose, Bld: 132 mg/dL — ABNORMAL HIGH (ref 70–99)
Potassium: 4 mmol/L (ref 3.5–5.1)
Sodium: 139 mmol/L (ref 135–145)
Total Bilirubin: 0.7 mg/dL (ref 0.3–1.2)
Total Protein: 6.1 g/dL — ABNORMAL LOW (ref 6.5–8.1)

## 2019-10-22 LAB — URINALYSIS, ROUTINE W REFLEX MICROSCOPIC
Bilirubin Urine: NEGATIVE
Glucose, UA: NEGATIVE mg/dL
Ketones, ur: NEGATIVE mg/dL
Leukocytes,Ua: NEGATIVE
Nitrite: POSITIVE — AB
Protein, ur: NEGATIVE mg/dL
Specific Gravity, Urine: 1.017 (ref 1.005–1.030)
pH: 5 (ref 5.0–8.0)

## 2019-10-22 LAB — CBC WITH DIFFERENTIAL/PLATELET
Abs Immature Granulocytes: 0.02 10*3/uL (ref 0.00–0.07)
Basophils Absolute: 0.1 10*3/uL (ref 0.0–0.1)
Basophils Relative: 1 %
Eosinophils Absolute: 0.2 10*3/uL (ref 0.0–0.5)
Eosinophils Relative: 3 %
HCT: 35.8 % — ABNORMAL LOW (ref 36.0–46.0)
Hemoglobin: 11.3 g/dL — ABNORMAL LOW (ref 12.0–15.0)
Immature Granulocytes: 0 %
Lymphocytes Relative: 18 %
Lymphs Abs: 1.1 10*3/uL (ref 0.7–4.0)
MCH: 31.1 pg (ref 26.0–34.0)
MCHC: 31.6 g/dL (ref 30.0–36.0)
MCV: 98.6 fL (ref 80.0–100.0)
Monocytes Absolute: 0.6 10*3/uL (ref 0.1–1.0)
Monocytes Relative: 10 %
Neutro Abs: 3.9 10*3/uL (ref 1.7–7.7)
Neutrophils Relative %: 68 %
Platelets: 224 10*3/uL (ref 150–400)
RBC: 3.63 MIL/uL — ABNORMAL LOW (ref 3.87–5.11)
RDW: 14.6 % (ref 11.5–15.5)
WBC: 5.8 10*3/uL (ref 4.0–10.5)
nRBC: 0 % (ref 0.0–0.2)

## 2019-10-22 MED ORDER — DIPHENHYDRAMINE HCL 25 MG PO CAPS
ORAL_CAPSULE | ORAL | Status: AC
  Filled 2019-10-22: qty 2

## 2019-10-22 MED ORDER — SODIUM CHLORIDE 0.9% FLUSH
10.0000 mL | INTRAVENOUS | Status: DC | PRN
  Administered 2019-10-22: 10 mL

## 2019-10-22 MED ORDER — ACETAMINOPHEN 325 MG PO TABS
ORAL_TABLET | ORAL | Status: AC
  Filled 2019-10-22: qty 2

## 2019-10-22 MED ORDER — DEXTROSE 5 % IV SOLN: Freq: Once | INTRAVENOUS | Status: AC

## 2019-10-22 MED ORDER — PALONOSETRON HCL INJECTION 0.25 MG/5ML
0.2500 mg | Freq: Once | INTRAVENOUS | Status: AC
  Administered 2019-10-22: 10:00:00 0.25 mg via INTRAVENOUS

## 2019-10-22 MED ORDER — CIPROFLOXACIN HCL 500 MG PO TABS: 500.0000 mg | ORAL_TABLET | Freq: Two times a day (BID) | ORAL | 0 refills | Status: DC

## 2019-10-22 MED ORDER — ACETAMINOPHEN 325 MG PO TABS
650.0000 mg | ORAL_TABLET | Freq: Once | ORAL | Status: AC
  Administered 2019-10-22: 650 mg via ORAL

## 2019-10-22 MED ORDER — SODIUM CHLORIDE 0.9 % IV SOLN
10.0000 mg | Freq: Once | INTRAVENOUS | Status: AC
  Administered 2019-10-22: 10 mg via INTRAVENOUS
  Filled 2019-10-22: qty 10

## 2019-10-22 MED ORDER — PALONOSETRON HCL INJECTION 0.25 MG/5ML
INTRAVENOUS | Status: AC
  Filled 2019-10-22: qty 5

## 2019-10-22 MED ORDER — DIPHENHYDRAMINE HCL 25 MG PO CAPS
25.0000 mg | ORAL_CAPSULE | Freq: Once | ORAL | Status: AC
  Administered 2019-10-22: 10:00:00 25 mg via ORAL

## 2019-10-22 MED ORDER — FAM-TRASTUZUMAB DERUXTECAN-NXKI CHEMO 100 MG IV SOLR
3.2000 mg/kg | Freq: Once | INTRAVENOUS | Status: AC
  Administered 2019-10-22: 168 mg via INTRAVENOUS
  Filled 2019-10-22: qty 8.4

## 2019-10-22 MED ORDER — HEPARIN SOD (PORK) LOCK FLUSH 100 UNIT/ML IV SOLN
500.0000 [IU] | Freq: Once | INTRAVENOUS | Status: AC | PRN
  Administered 2019-10-22: 500 [IU]

## 2019-10-22 NOTE — Progress Notes (Signed)
Dr. Delton Coombes has seen and assessed the patient.  She will get urinalysis today.  She is to stop her amlodipine due to dizziness.  She will stop her Ditropan and then start myrbetriq.  She is okay to proceed with treatment today.

## 2019-10-22 NOTE — Patient Instructions (Addendum)
[place] at [facility] Discharge Instructions  You were seen today by Dr. Delton Coombes. He talked to you about how you have been feeling. He will get a urinalysis today to make sure you don't have an infection.  When he talked with you about the dizziness, he wants you to stop your amlodipine.  He talked with you about the new treatment and possible side effects.    He will see you back next week to see how you tolerated treatment this week.   You will need an echocardiogram before your next treatment.  You will get your next treatment in 3 weeks. He also discussed with you that you need to stop taking the ditropan and he will start you on Myrbetriq.  This will be sent to your pharmacy.    Thank you for choosing [place] at [facility] to provide your oncology and hematology care.  To afford each patient quality time with our provider, please arrive at least 15 minutes before your scheduled appointment time.   If you have a lab appointment with the [facility] please come in thru the Main Entrance and check in at the main information desk.  You need to re-schedule your appointment should you arrive 10 or more minutes late.  We strive to give you quality time with our providers, and arriving late affects you and other patients whose appointments are after yours.  Also, if you no show three or more times for appointments you may be dismissed from the clinic at the providers discretion.     Again, thank you for choosing [facility].  Our hope is that these requests will decrease the amount of time that you wait before being seen by our physicians.       _____________________________________________________________  Should you have questions after your visit to [facility], please contact our office at [phone] between the hours of 8:00 a.m. and 4:30 p.m.  Voicemails left after 4:00 p.m. will not be returned until the  following business day.  For prescription refill requests, have your pharmacy contact our office and allow 72 hours.    Due to Covid, you will need to wear a mask upon entering the hospital. If you do not have a mask, a mask will be given to you at the Main Entrance upon arrival. For doctor visits, patients may have 1 support person with them. For treatment visits, patients can not have anyone with them due to social distancing guidelines and our immunocompromised population.

## 2019-10-22 NOTE — Progress Notes (Signed)
10/22/19  Decrease diphenhydramine as premedication to 25 mg po x 1  Orders entered.  T.O. Dr Jim Desanctis, RN/Jaysen Wey Ronnald Ramp, PharmD

## 2019-10-22 NOTE — Patient Instructions (Signed)
[facility] Discharge Instructions for Patients Receiving Chemotherapy  Today you received the following chemotherapy agents   To help prevent nausea and vomiting after your treatment, we encourage you to take your nausea medication   If you develop nausea and vomiting that is not controlled by your nausea medication, call the clinic.   BELOW ARE SYMPTOMS THAT SHOULD BE REPORTED IMMEDIATELY:  *FEVER GREATER THAN 100.5 F  *CHILLS WITH OR WITHOUT FEVER  NAUSEA AND VOMITING THAT IS NOT CONTROLLED WITH YOUR NAUSEA MEDICATION  *UNUSUAL SHORTNESS OF BREATH  *UNUSUAL BRUISING OR BLEEDING  TENDERNESS IN MOUTH AND THROAT WITH OR WITHOUT PRESENCE OF ULCERS  *URINARY PROBLEMS  *BOWEL PROBLEMS  UNUSUAL RASH Items with * indicate a potential emergency and should be followed up as soon as possible.  Feel free to call the clinic should you have any questions or concerns. The clinic phone number is [phone].  Please show the CHEMO ALERT CARD at check-in to the Emergency Department and triage nurse.   

## 2019-10-22 NOTE — Assessment & Plan Note (Signed)
1.  Metastatic HER-2 positive left breast cancer: -8 cycles of Kadcyla from 03/12/2019 through 08/20/2019 with progression. -CT CAP on 10/14/2019 images were reviewed by me and compared to PET scan.  There is enlarging bilateral pulmonary nodules and enlarging left infrahilar mass.  And hilar mass increased by at least 1 cm.  CA 15-3 has always been normal.  Left axillary lymph nodes are similar.  Multiple small lung nodules have also progressed.  No metastatic disease in the abdomen or pelvis. -Based on her progression, we have recommended changing her therapy to Enhertu. -We discussed various side effects including but not limited to cytopenias, decreased LV function, infections and interstitial lung disease/pneumonitis. -I have reviewed her CBC and LFTs.  They are adequate to proceed with treatment today.  I will reevaluate her in 1 week. -We will plan to repeat her labs next week.  2.  Urinary incontinence: -She reported that urine is dribbling out when she stands up every time.  Denies any dysuria. -She is currently on oxybutynin 5 mg for overactive bladder.  We have checked a UA which showed positive leukocytes and nitrites. -We will give her Cipro for 5 days.  If the symptoms does not improve, we will switch her to Myrbetriq at a lower dose.  3.  High risk drug monitoring: -Last echo from 07/02/2019 shows EF 60 to 65%.  I plan to repeat echo in 3 weeks.  4.  Left leg pain: -This is similar to the pain she experienced prior to her left hip fracture.  MRI at that time showed a lot of degenerative changes. -She will reach out to her orthopedic doctor in [place] for injections.  She had 1 injection previously which helped.  5.  Dizziness: -She reported occasional dizziness.  I have reviewed her hypertensive medications. -She will continue labetalol 100 mg twice daily. -She was told to hold off on taking Norvasc 5 mg daily.  Blood pressure today is 118/47.

## 2019-10-22 NOTE — Progress Notes (Signed)
Pt seen earlier today in clinic, called and notified pt's daughter of new antibiotic sent to the pt's pharmacy. Pt's daughter verbalized understanding.

## 2019-10-22 NOTE — Progress Notes (Signed)
[place], [place] [address]   CLINIC:  Medical Oncology/Hematology  PCP:  [name], MD [place] [id number] [phone]   REASON FOR VISIT:  Follow-up for Breast Cancer   CURRENT THERAPY: Kadcyla.  BRIEF ONCOLOGIC HISTORY:  Oncology History  Invasive ductal carcinoma of left breast ([place])  03/30/2017 Initial Diagnosis   Invasive ductal carcinoma of left breast ([place])   08/09/2018 - 03/20/2019 Chemotherapy   The patient had trastuzumab (HERCEPTIN) 500 mg in sodium chloride 0.9 % 250 mL chemo infusion, 525 mg, Intravenous,  Once, 10 of 10 cycles Administration: 500 mg (08/09/2018), 350 mg (09/06/2018), 350 mg (09/27/2018), 378 mg (10/18/2018), 378 mg (11/15/2018), 378 mg (12/06/2018), 378 mg (12/28/2018), 378 mg (01/18/2019), 378 mg (02/08/2019) pertuzumab (PERJETA) 840 mg in sodium chloride 0.9 % 250 mL chemo infusion, 840 mg (100 % of original dose 840 mg), Intravenous, Once, 6 of 6 cycles Dose modification: 840 mg (original dose 840 mg, Cycle 5), 420 mg (original dose 420 mg, Cycle 6) Administration: 840 mg (11/15/2018), 420 mg (12/06/2018), 420 mg (12/28/2018), 420 mg (01/18/2019), 420 mg (02/08/2019)  for chemotherapy treatment.    09/18/2018 Genetic Testing   Negative genetic testing for the breast and gyn cancer panel.  The Breast/GYN gene panel offered by [facility] includes sequencing and rearrangement analysis for the following 23 genes:  ATM, BRCA1, BRCA2, BRIP1, CDH1, CHEK2, EPCAM, MLH1, MSH2, MSH6, NBN, NF1, PALB2, PMS2, PTEN, RAD51C, RAD51D, STK11, and TP53.   The report date is 09/18/2018.   03/12/2019 - 09/09/2019 Chemotherapy   The patient had ado-trastuzumab emtansine (KADCYLA) 140 mg in sodium chloride 0.9 % 250 mL chemo infusion, 2.4 mg/kg = 140 mg (100 % of original dose 2.4 mg/kg), Intravenous, Once, 8 of 9 cycles Dose modification: 2.4 mg/kg (original dose 2.4 mg/kg, Cycle 1, Reason: Patient Age), 3 mg/kg (original dose  2.4 mg/kg, Cycle 3, Reason: Provider Judgment) Administration: 140 mg (03/12/2019), 180 mg (04/23/2019), 180 mg (06/18/2019), 180 mg (04/02/2019), 180 mg (05/28/2019), 180 mg (07/09/2019), 180 mg (07/30/2019), 180 mg (08/20/2019)  for chemotherapy treatment.    10/22/2019 -  Chemotherapy   The patient had palonosetron (ALOXI) injection 0.25 mg, 0.25 mg, Intravenous,  Once, 1 of 6 cycles Administration: 0.25 mg (10/22/2019) fam-trastuzumab deruxtecan-nxki (ENHERTU) 168 mg in dextrose 5 % 100 mL chemo infusion, 3.2 mg/kg = 168 mg (100 % of original dose 3.2 mg/kg), Intravenous,  Once, 1 of 6 cycles Dose modification: 3.2 mg/kg (original dose 3.2 mg/kg, Cycle 1, Reason: Provider Judgment) Administration: 168 mg (10/22/2019)  for chemotherapy treatment.       CANCER STAGING: Cancer Staging No matching staging information was found for the patient.   INTERVAL HISTORY:  Carol Duarte 86 y.o. female seen for follow-up of metastatic HER-2 positive left breast cancer.  She reports occasional lightheadedness.  She also reports urine dribbling when she stands up each time.  Appetite is 50%.  Energy levels are 25%.  She reported left leg pain has come back over the last few days.  She had this pain to begin with 2% of last year.  She had 1 injection done in [place] which helped with the pain.  Denies any dysuria.  Denies any fevers or chills.  REVIEW OF SYSTEMS:  Review of Systems  Constitutional: Positive for fatigue.  Genitourinary: Positive for bladder incontinence.   Neurological: Positive for dizziness.  All other systems reviewed and are negative.    PAST MEDICAL/SURGICAL HISTORY:  Past Medical History:  Diagnosis Date  . Cancer ([place])    left breast  . History of kidney stones   . Hypercholesteremia   . Hypertension    Past Surgical History:  Procedure Laterality Date  . APPENDECTOMY    . brain cyst removed  2011  . BREAST BIOPSY Left 03/28/2018   Procedure: BREAST BIOPSY;  Surgeon:  [name], MD;  Location: [facility];  Service: General;  Laterality: Left;  . CATARACT EXTRACTION W/PHACO Left 11/09/2015   Procedure: CATARACT EXTRACTION PHACO AND INTRAOCULAR LENS PLACEMENT LEFT EYE cde=8.97;  Surgeon: [name], MD;  Location: [facility];  Service: Ophthalmology;  Laterality: Left;  . CATARACT EXTRACTION W/PHACO Right 02/04/2019   Procedure: CATARACT EXTRACTION PHACO AND INTRAOCULAR LENS PLACEMENT (IOC);  Surgeon: [name], MD;  Location: [facility];  Service: Ophthalmology;  Laterality: Right;  CDE: 15.19  . EYE SURGERY     KPE left  . MASTECTOMY MODIFIED RADICAL Left 05/28/2018   Procedure: LEFT MODIFIED RADICAL MASTECTOMY;  Surgeon: [name], MD;  Location: [facility];  Service: General;  Laterality: Left;  Marland Kitchen MASTECTOMY, PARTIAL Left 04/24/2017   Procedure: MASTECTOMY PARTIAL;  Surgeon: [name], MD;  Location: [facility];  Service: General;  Laterality: Left;  . ORIF FEMUR FRACTURE Left 09/03/2019   Procedure: OPEN REDUCTION INTERNAL FIXATION (ORIF) LEFT HIP FEMUR FRACTURE;  Surgeon: Paralee Cancel, MD;  Location: [facility];  Service: Orthopedics;  Laterality: Left;  . PORTACATH PLACEMENT Right 08/08/2018   Procedure: INSERTION PORT-A-CATH;  Surgeon: [name], MD;  Location: [facility];  Service: General;  Laterality: Right;  . TONSILLECTOMY    . TONSILLECTOMY AND ADENOIDECTOMY       SOCIAL HISTORY:  Social History   Socioeconomic History  . Marital status: Widowed    Spouse name: Not on file  . Number of children: Not on file  . Years of education: Not on file  . Highest education level: Not on file  Occupational History  . Not on file  Tobacco Use  . Smoking status: Never Smoker  . Smokeless tobacco: Never Used  Substance and Sexual Activity  . Alcohol use: No  . Drug use: No  . Sexual activity: Not Currently    Partners: Male  Other Topics Concern  . Not on file  Social History Narrative  . Not on file   Social Determinants of Health   Financial  Resource Strain:   . Difficulty of Paying Living Expenses: Not on file  Food Insecurity:   . Worried About Charity fundraiser in the Last Year: Not on file  . Ran Out of Food in the Last Year: Not on file  Transportation Needs:   . Lack of Transportation (Medical): Not on file  . Lack of Transportation (Non-Medical): Not on file  Physical Activity:   . Days of Exercise per Week: Not on file  . Minutes of Exercise per Session: Not on file  Stress:   . Feeling of Stress : Not on file  Social Connections:   . Frequency of Communication with Friends and Family: Not on file  . Frequency of Social Gatherings with Friends and Family: Not on file  . Attends Religious Services: Not on file  . Active Member of Clubs or Organizations: Not on file  . Attends Archivist Meetings: Not on file  . Marital Status: Not on file  Intimate Partner Violence:   . Fear of Current or Ex-Partner: Not on file  . Emotionally Abused:  Not on file  . Physically Abused: Not on file  . Sexually Abused: Not on file    FAMILY HISTORY:  Family History  Problem Relation Age of Onset  . Heart failure Mother   . Heart failure Father   . Congestive Heart Failure Brother   . Tuberculosis Paternal Uncle   . Tuberculosis Maternal Grandmother   . Tuberculosis Maternal Grandfather   . Congestive Heart Failure Brother     CURRENT MEDICATIONS:  Outpatient Encounter Medications as of 10/22/2019  Medication Sig  . Ado-Trastuzumab Emtansine (KADCYLA IV) Inject into the vein every 21 ( twenty-one) days.  . ALPRAZolam (XANAX) 0.5 MG tablet Take 1 tablet 30 min before PET (Patient not taking: Reported on 10/16/2019)  . amLODipine (NORVASC) 5 MG tablet Take 5 mg by mouth daily.  . ciprofloxacin (CIPRO) 500 MG tablet Take 1 tablet (500 mg total) by mouth 2 (two) times daily.  . ferrous sulfate 325 (65 FE) MG tablet Take 325 mg by mouth daily with breakfast.  . HYDROcodone-acetaminophen (NORCO/VICODIN) 5-325 MG  tablet Take 1 tablet by mouth every 6 (six) hours as needed for moderate pain or severe pain. (Patient not taking: Reported on 10/16/2019)  . labetalol (NORMODYNE) 100 MG tablet Take 1 tablet (100 mg total) by mouth 2 (two) times daily.  . methocarbamol (ROBAXIN) 500 MG tablet Take 1 tablet (500 mg total) by mouth every 6 (six) hours as needed for muscle spasms. (Patient not taking: Reported on 10/16/2019)  . oxybutynin (DITROPAN) 5 MG tablet Take 5 mg by mouth daily.   . simvastatin (ZOCOR) 40 MG tablet Take 40 mg by mouth daily.  Marland Kitchen zolpidem (AMBIEN) 5 MG tablet Take 1 tablet (5 mg total) by mouth at bedtime as needed for sleep. (Patient taking differently: Take 5 mg by mouth at bedtime. )  . [DISCONTINUED] prochlorperazine (COMPAZINE) 10 MG tablet Take 1 tablet (10 mg total) by mouth every 6 (six) hours as needed (Nausea or vomiting). (Patient taking differently: Take 10 mg by mouth daily. )   No facility-administered encounter medications on file as of 10/22/2019.    ALLERGIES:  No Known Allergies   PHYSICAL EXAM:  ECOG Performance status: 1  Vitals:   10/22/19 0755  BP: (!) 118/47  Pulse: (!) 54  Resp: 18  Temp: (!) 96.8 F (36 C)  SpO2: 100%   Filed Weights   10/22/19 0755  Weight: 114 lb 3.2 oz (51.8 kg)    Physical Exam Constitutional:      Appearance: Normal appearance.  HENT:     Head: Normocephalic.     Nose: Nose normal.  Eyes:     Conjunctiva/sclera: Conjunctivae normal.  Cardiovascular:     Rate and Rhythm: Normal rate and regular rhythm.     Pulses: Normal pulses.     Heart sounds: Normal heart sounds.  Pulmonary:     Effort: Pulmonary effort is normal.     Breath sounds: Normal breath sounds.  Abdominal:     General: Bowel sounds are normal.  Musculoskeletal:        General: Normal range of motion.     Cervical back: Normal range of motion.  Skin:    General: Skin is warm and dry.  Neurological:     General: No focal deficit present.     Mental  Status: She is alert and oriented to person, place, and time.  Psychiatric:        Mood and Affect: Mood normal.  Behavior: Behavior normal.        Thought Content: Thought content normal.        Judgment: Judgment normal.      LABORATORY DATA:  I have reviewed the labs as listed.  CBC    Component Value Date/Time   WBC 5.8 10/22/2019 0751   RBC 3.63 (L) 10/22/2019 0751   HGB 11.3 (L) 10/22/2019 0751   HCT 35.8 (L) 10/22/2019 0751   PLT 224 10/22/2019 0751   MCV 98.6 10/22/2019 0751   MCH 31.1 10/22/2019 0751   MCHC 31.6 10/22/2019 0751   RDW 14.6 10/22/2019 0751   LYMPHSABS 1.1 10/22/2019 0751   MONOABS 0.6 10/22/2019 0751   EOSABS 0.2 10/22/2019 0751   BASOSABS 0.1 10/22/2019 0751   CMP Latest Ref Rng & Units 10/22/2019 10/02/2019 09/07/2019  Glucose 70 - 99 mg/dL 132(H) 124(H) 130(H)  BUN 8 - 23 mg/dL 26(H) 27(H) 35(H)  Creatinine 0.44 - 1.00 mg/dL 1.10(H) 1.14(H) 0.92  Sodium 135 - 145 mmol/L 139 141 137  Potassium 3.5 - 5.1 mmol/L 4.0 3.9 4.4  Chloride 98 - 111 mmol/L 106 108 104  CO2 22 - 32 mmol/L _0 Calcium 8.9 - 10.3 mg/dL 9.1 9.0 8.4(L)  Total Protein 6.5 - 8.1 g/dL 6.1(L) 6.1(L) -  Total Bilirubin 0.3 - 1.2 mg/dL 0.7 0.9 -  Alkaline Phos 38 - 126 U/L 88 125 -  AST 15 - 41 U/L 25 30 -  ALT 0 - 44 U/L 15 20 -   I have independently reviewed her PET CT scan and discussed with the patient.   ASSESSMENT & PLAN:   Invasive ductal carcinoma of left breast ([place]) 1.  Metastatic HER-2 positive left breast cancer: -8 cycles of Kadcyla from 03/12/2019 through 08/20/2019 with progression. -CT CAP on 10/14/2019 images were reviewed by me and compared to PET scan.  There is enlarging bilateral pulmonary nodules and enlarging left infrahilar mass.  And hilar mass increased by at least 1 cm.  CA 15-3 has always been normal.  Left axillary lymph nodes are similar.  Multiple small lung nodules have also progressed.  No metastatic disease in the abdomen or  pelvis. -Based on her progression, we have recommended changing her therapy to Enhertu. -We discussed various side effects including but not limited to cytopenias, decreased LV function, infections and interstitial lung disease/pneumonitis. -I have reviewed her CBC and LFTs.  They are adequate to proceed with treatment today.  I will reevaluate her in 1 week. -We will plan to repeat her labs next week.  2.  Urinary incontinence: -She reported that urine is dribbling out when she stands up every time.  Denies any dysuria. -She is currently on oxybutynin 5 mg for overactive bladder.  We have checked a UA which showed positive leukocytes and nitrites. -We will give her Cipro for 5 days.  If the symptoms does not improve, we will switch her to Myrbetriq at a lower dose.  3.  High risk drug monitoring: -Last echo from 07/02/2019 shows EF 60 to 65%.  I plan to repeat echo in 3 weeks.  4.  Left leg pain: -This is similar to the pain she experienced prior to her left hip fracture.  MRI at that time showed a lot of degenerative changes. -She will reach out to her orthopedic doctor in [place] for injections.  She had 1 injection previously which helped.  5.  Dizziness: -She reported occasional dizziness.  I have reviewed her hypertensive medications. -She  will continue labetalol 100 mg twice daily. -She was told to hold off on taking Norvasc 5 mg daily.  Blood pressure today is 118/47.    Orders placed this encounter:  Orders Placed This Encounter  Procedures  . Urinalysis, Routine w reflex microscopic  . ECHOCARDIOGRAM COMPLETE     Derek Jack, [place] [phone]

## 2019-10-22 NOTE — Progress Notes (Signed)
Patient presents today for treatment and follow up visit with Dr. Delton Coombes. Labs pending. MAR reviewed and updated. Vital signs within parameters for treatment. Patient has no complaints of pain today.  Message received from [place] RN, obtain urine for UA per Dr. Delton Coombes.   Treatment given today per MD orders. Tolerated infusion without adverse affects. Vital signs stable. No complaints at this time. Patient reminded to stop taking Amlodipine 5 mg per daily [place] RN. Understanding verbalized. Discharged from clinic via wheel chair.  F/U with [facility] as scheduled.

## 2019-10-23 DIAGNOSIS — S72142D Displaced intertrochanteric fracture of left femur, subsequent encounter for closed fracture with routine healing: Secondary | ICD-10-CM | POA: Diagnosis not present

## 2019-10-24 ENCOUNTER — Telehealth: Payer: Self-pay | Admitting: Orthopedic Surgery

## 2019-10-24 NOTE — Telephone Encounter (Signed)
Patient called personally from ph# [phone] - also in chart is patient's designated party contact on file, daughter Jeannene Patella at [phone]. Patient said her 'hip is well' but that she would like to have another injection 'like the one done in December at [facility]. Said she understood she can get up to 3 injections. I asked patient about her insurance which we last verified in December of 2020 - she said it is the same.  Please advise patient if she may call to schedule another one?

## 2019-10-28 ENCOUNTER — Other Ambulatory Visit: Payer: Self-pay | Admitting: Orthopedic Surgery

## 2019-10-28 DIAGNOSIS — M5410 Radiculopathy, site unspecified: Secondary | ICD-10-CM

## 2019-10-29 ENCOUNTER — Inpatient Hospital Stay (HOSPITAL_COMMUNITY): Payer: Medicare Other | Attending: Hematology | Admitting: Hematology

## 2019-10-29 ENCOUNTER — Inpatient Hospital Stay (HOSPITAL_COMMUNITY): Payer: Medicare Other

## 2019-10-29 ENCOUNTER — Other Ambulatory Visit: Payer: Self-pay

## 2019-10-29 DIAGNOSIS — C7802 Secondary malignant neoplasm of left lung: Secondary | ICD-10-CM | POA: Diagnosis not present

## 2019-10-29 DIAGNOSIS — Z5112 Encounter for antineoplastic immunotherapy: Secondary | ICD-10-CM | POA: Diagnosis not present

## 2019-10-29 DIAGNOSIS — R32 Unspecified urinary incontinence: Secondary | ICD-10-CM | POA: Diagnosis not present

## 2019-10-29 DIAGNOSIS — I10 Essential (primary) hypertension: Secondary | ICD-10-CM | POA: Diagnosis not present

## 2019-10-29 DIAGNOSIS — C50912 Malignant neoplasm of unspecified site of left female breast: Secondary | ICD-10-CM

## 2019-10-29 DIAGNOSIS — C7801 Secondary malignant neoplasm of right lung: Secondary | ICD-10-CM | POA: Insufficient documentation

## 2019-10-29 DIAGNOSIS — Z836 Family history of other diseases of the respiratory system: Secondary | ICD-10-CM | POA: Insufficient documentation

## 2019-10-29 DIAGNOSIS — M79605 Pain in left leg: Secondary | ICD-10-CM | POA: Diagnosis not present

## 2019-10-29 DIAGNOSIS — Z8249 Family history of ischemic heart disease and other diseases of the circulatory system: Secondary | ICD-10-CM | POA: Insufficient documentation

## 2019-10-29 DIAGNOSIS — Z87442 Personal history of urinary calculi: Secondary | ICD-10-CM | POA: Insufficient documentation

## 2019-10-29 DIAGNOSIS — G479 Sleep disorder, unspecified: Secondary | ICD-10-CM | POA: Diagnosis not present

## 2019-10-29 DIAGNOSIS — C50812 Malignant neoplasm of overlapping sites of left female breast: Secondary | ICD-10-CM | POA: Insufficient documentation

## 2019-10-29 DIAGNOSIS — R5383 Other fatigue: Secondary | ICD-10-CM | POA: Insufficient documentation

## 2019-10-29 DIAGNOSIS — R42 Dizziness and giddiness: Secondary | ICD-10-CM | POA: Insufficient documentation

## 2019-10-29 DIAGNOSIS — R7989 Other specified abnormal findings of blood chemistry: Secondary | ICD-10-CM | POA: Insufficient documentation

## 2019-10-29 DIAGNOSIS — Z79899 Other long term (current) drug therapy: Secondary | ICD-10-CM | POA: Diagnosis not present

## 2019-10-29 DIAGNOSIS — R197 Diarrhea, unspecified: Secondary | ICD-10-CM | POA: Insufficient documentation

## 2019-10-29 DIAGNOSIS — R110 Nausea: Secondary | ICD-10-CM | POA: Diagnosis not present

## 2019-10-29 LAB — COMPREHENSIVE METABOLIC PANEL
ALT: 14 U/L (ref 0–44)
AST: 25 U/L (ref 15–41)
Albumin: 3.4 g/dL — ABNORMAL LOW (ref 3.5–5.0)
Alkaline Phosphatase: 73 U/L (ref 38–126)
Anion gap: 10 (ref 5–15)
BUN: 36 mg/dL — ABNORMAL HIGH (ref 8–23)
CO2: 21 mmol/L — ABNORMAL LOW (ref 22–32)
Calcium: 8.9 mg/dL (ref 8.9–10.3)
Chloride: 105 mmol/L (ref 98–111)
Creatinine, Ser: 1.39 mg/dL — ABNORMAL HIGH (ref 0.44–1.00)
GFR calc Af Amer: 38 mL/min — ABNORMAL LOW (ref >60)
GFR calc non Af Amer: 33 mL/min — ABNORMAL LOW (ref >60)
Glucose, Bld: 170 mg/dL — ABNORMAL HIGH (ref 70–99)
Potassium: 4.5 mmol/L (ref 3.5–5.1)
Sodium: 136 mmol/L (ref 135–145)
Total Bilirubin: 0.6 mg/dL (ref 0.3–1.2)
Total Protein: 6.3 g/dL — ABNORMAL LOW (ref 6.5–8.1)

## 2019-10-29 LAB — CBC WITH DIFFERENTIAL/PLATELET
Abs Immature Granulocytes: 0.04 10*3/uL (ref 0.00–0.07)
Basophils Absolute: 0 10*3/uL (ref 0.0–0.1)
Basophils Relative: 1 %
Eosinophils Absolute: 0.6 10*3/uL — ABNORMAL HIGH (ref 0.0–0.5)
Eosinophils Relative: 8 %
HCT: 36.9 % (ref 36.0–46.0)
Hemoglobin: 11.8 g/dL — ABNORMAL LOW (ref 12.0–15.0)
Immature Granulocytes: 1 %
Lymphocytes Relative: 13 %
Lymphs Abs: 0.9 10*3/uL (ref 0.7–4.0)
MCH: 31.1 pg (ref 26.0–34.0)
MCHC: 32 g/dL (ref 30.0–36.0)
MCV: 97.1 fL (ref 80.0–100.0)
Monocytes Absolute: 0.5 10*3/uL (ref 0.1–1.0)
Monocytes Relative: 7 %
Neutro Abs: 5.3 10*3/uL (ref 1.7–7.7)
Neutrophils Relative %: 70 %
Platelets: 250 10*3/uL (ref 150–400)
RBC: 3.8 MIL/uL — ABNORMAL LOW (ref 3.87–5.11)
RDW: 14.1 % (ref 11.5–15.5)
WBC: 7.4 10*3/uL (ref 4.0–10.5)
nRBC: 0 % (ref 0.0–0.2)

## 2019-10-29 MED ORDER — SODIUM CHLORIDE 0.9% FLUSH
10.0000 mL | INTRAVENOUS | Status: DC | PRN
  Administered 2019-10-29: 10 mL via INTRAVENOUS

## 2019-10-29 MED ORDER — TEMAZEPAM 15 MG PO CAPS: 15.0000 mg | ORAL_CAPSULE | Freq: Every evening | ORAL | 0 refills | Status: DC | PRN

## 2019-10-29 MED ORDER — SODIUM CHLORIDE 0.9 % IV SOLN: INTRAVENOUS | Status: DC

## 2019-10-29 MED ORDER — HEPARIN SOD (PORK) LOCK FLUSH 100 UNIT/ML IV SOLN
500.0000 [IU] | Freq: Once | INTRAVENOUS | Status: AC
  Administered 2019-10-29: 500 [IU] via INTRAVENOUS

## 2019-10-29 MED ORDER — ONDANSETRON HCL 4 MG/2ML IJ SOLN
8.0000 mg | Freq: Once | INTRAMUSCULAR | Status: AC
  Administered 2019-10-29: 8 mg via INTRAVENOUS
  Filled 2019-10-29: qty 4

## 2019-10-29 NOTE — Patient Instructions (Addendum)
[facility] at [facility] Discharge Instructions  You were seen today by Dr. Katragadda. He went over your recent lab results. He will see you back in 2 weeks for labs, treatment and follow up.   Thank you for choosing [facility] at [facility] to provide your oncology and hematology care.  To afford each patient quality time with our provider, please arrive at least 15 minutes before your scheduled appointment time.   If you have a lab appointment with the Cancer Center please come in thru the  Main Entrance and check in at the main information desk  You need to re-schedule your appointment should you arrive 10 or more minutes late.  We strive to give you quality time with our providers, and arriving late affects you and other patients whose appointments are after yours.  Also, if you no show three or more times for appointments you may be dismissed from the clinic at the providers discretion.     Again, thank you for choosing [facility].  Our hope is that these requests will decrease the amount of time that you wait before being seen by our physicians.       _____________________________________________________________  Should you have questions after your visit to [facility], please contact our office at [phone] between the hours of 8:00 a.m. and 4:30 p.m.  Voicemails left after 4:00 p.m. will not be returned until the following business day.  For prescription refill requests, have your pharmacy contact our office and allow 72 hours.    Cancer Center Support Programs:   > Cancer Support Group  2nd Tuesday of the month 1pm-2pm, Journey Room    

## 2019-10-29 NOTE — Progress Notes (Signed)
[address] [place], [place] [address]   CLINIC:  Medical Oncology/Hematology  PCP:  [name], MD [place] [address] [phone]   REASON FOR VISIT:  Follow-up for Breast Cancer   CURRENT THERAPY: Enhertu  BRIEF ONCOLOGIC HISTORY:  Oncology History  Invasive ductal carcinoma of left breast ([place])  03/30/2017 Initial Diagnosis   Invasive ductal carcinoma of left breast ([facility])   08/09/2018 - 03/20/2019 Chemotherapy   The patient had trastuzumab (HERCEPTIN) 500 mg in sodium chloride 0.9 % 250 mL chemo infusion, 525 mg, Intravenous,  Once, 10 of 10 cycles Administration: 500 mg (08/09/2018), 350 mg (09/06/2018), 350 mg (09/27/2018), 378 mg (10/18/2018), 378 mg (11/15/2018), 378 mg (12/06/2018), 378 mg (12/28/2018), 378 mg (01/18/2019), 378 mg (02/08/2019) pertuzumab (PERJETA) 840 mg in sodium chloride 0.9 % 250 mL chemo infusion, 840 mg (100 % of original dose 840 mg), Intravenous, Once, 6 of 6 cycles Dose modification: 840 mg (original dose 840 mg, Cycle 5), 420 mg (original dose 420 mg, Cycle 6) Administration: 840 mg (11/15/2018), 420 mg (12/06/2018), 420 mg (12/28/2018), 420 mg (01/18/2019), 420 mg (02/08/2019)  for chemotherapy treatment.    09/18/2018 Genetic Testing   Negative genetic testing for the breast and gyn cancer panel.  The Breast/GYN gene panel offered by [facility] includes sequencing and rearrangement analysis for the following 23 genes:  ATM, BRCA1, BRCA2, BRIP1, CDH1, CHEK2, EPCAM, MLH1, MSH2, MSH6, NBN, NF1, PALB2, PMS2, PTEN, RAD51C, RAD51D, STK11, and TP53.   The report date is 09/18/2018.   03/12/2019 - 09/09/2019 Chemotherapy   The patient had ado-trastuzumab emtansine (KADCYLA) 140 mg in sodium chloride 0.9 % 250 mL chemo infusion, 2.4 mg/kg = 140 mg (100 % of original dose 2.4 mg/kg), Intravenous, Once, 8 of 9 cycles Dose modification: 2.4 mg/kg (original dose 2.4 mg/kg, Cycle 1, Reason: Patient Age), 3 mg/kg (original dose  2.4 mg/kg, Cycle 3, Reason: Provider Judgment) Administration: 140 mg (03/12/2019), 180 mg (04/23/2019), 180 mg (06/18/2019), 180 mg (04/02/2019), 180 mg (05/28/2019), 180 mg (07/09/2019), 180 mg (07/30/2019), 180 mg (08/20/2019)  for chemotherapy treatment.    10/22/2019 -  Chemotherapy   The patient had palonosetron (ALOXI) injection 0.25 mg, 0.25 mg, Intravenous,  Once, 1 of 6 cycles Administration: 0.25 mg (10/22/2019) fam-trastuzumab deruxtecan-nxki (ENHERTU) 168 mg in dextrose 5 % 100 mL chemo infusion, 3.2 mg/kg = 168 mg (100 % of original dose 3.2 mg/kg), Intravenous,  Once, 1 of 6 cycles Dose modification: 3.2 mg/kg (original dose 3.2 mg/kg, Cycle 1, Reason: Provider Judgment) Administration: 168 mg (10/22/2019)  for chemotherapy treatment.       CANCER STAGING: Cancer Staging No matching staging information was found for the patient.   INTERVAL HISTORY:  [name] 86 y.o. female seen for follow-up and toxicity management after her first cycle of Enhertu.  She received first cycle on 10/22/2019.  Reported decrease in appetite.  Also felt nauseous all day Sunday.  She took Compazine which helped some.  She does not report any vomiting.  She is slowly getting active.  She reports continuing left leg pain.  She had apparently received her first COVID-19 vaccine last Monday.  She has contacted her orthopedic doctor in [place].  She reports appetite and energy levels are 25%.  She is taking Cipro at this time.  Her urinary incontinence has improved.  REVIEW OF SYSTEMS:  Review of Systems  Constitutional: Positive for fatigue.  Gastrointestinal: Positive for constipation and diarrhea.  Neurological: Positive for  dizziness.  All other systems reviewed and are negative.    PAST MEDICAL/SURGICAL HISTORY:  Past Medical History:  Diagnosis Date  . Cancer ([place])    left breast  . History of kidney stones   . Hypercholesteremia   . Hypertension    Past Surgical History:  Procedure  Laterality Date  . APPENDECTOMY    . brain cyst removed  2011  . BREAST BIOPSY Left 03/28/2018   Procedure: BREAST BIOPSY;  Surgeon: [name], MD;  Location: [facility];  Service: General;  Laterality: Left;  . CATARACT EXTRACTION W/PHACO Left 11/09/2015   Procedure: CATARACT EXTRACTION PHACO AND INTRAOCULAR LENS PLACEMENT LEFT EYE cde=8.97;  Surgeon: [name], MD;  Location: [facility];  Service: Ophthalmology;  Laterality: Left;  . CATARACT EXTRACTION W/PHACO Right 02/04/2019   Procedure: CATARACT EXTRACTION PHACO AND INTRAOCULAR LENS PLACEMENT (IOC);  Surgeon: [name], MD;  Location: [facility];  Service: Ophthalmology;  Laterality: Right;  CDE: 15.19  . EYE SURGERY     KPE left  . MASTECTOMY MODIFIED RADICAL Left 05/28/2018   Procedure: LEFT MODIFIED RADICAL MASTECTOMY;  Surgeon: [name], MD;  Location: [facility];  Service: General;  Laterality: Left;  Marland Kitchen MASTECTOMY, PARTIAL Left 04/24/2017   Procedure: MASTECTOMY PARTIAL;  Surgeon: [name], MD;  Location: [facility];  Service: General;  Laterality: Left;  . ORIF FEMUR FRACTURE Left 09/03/2019   Procedure: OPEN REDUCTION INTERNAL FIXATION (ORIF) LEFT HIP FEMUR FRACTURE;  Surgeon: Paralee Cancel, MD;  Location: [facility];  Service: Orthopedics;  Laterality: Left;  . PORTACATH PLACEMENT Right 08/08/2018   Procedure: INSERTION PORT-A-CATH;  Surgeon: [name], MD;  Location: [facility];  Service: General;  Laterality: Right;  . TONSILLECTOMY    . TONSILLECTOMY AND ADENOIDECTOMY       SOCIAL HISTORY:  Social History   Socioeconomic History  . Marital status: Widowed    Spouse name: Not on file  . Number of children: Not on file  . Years of education: Not on file  . Highest education level: Not on file  Occupational History  . Not on file  Tobacco Use  . Smoking status: Never Smoker  . Smokeless tobacco: Never Used  Substance and Sexual Activity  . Alcohol use: No  . Drug use: No  . Sexual activity: Not Currently    Partners: Male    Other Topics Concern  . Not on file  Social History Narrative  . Not on file   Social Determinants of Health   Financial Resource Strain:   . Difficulty of Paying Living Expenses: Not on file  Food Insecurity:   . Worried About Charity fundraiser in the Last Year: Not on file  . Ran Out of Food in the Last Year: Not on file  Transportation Needs:   . Lack of Transportation (Medical): Not on file  . Lack of Transportation (Non-Medical): Not on file  Physical Activity:   . Days of Exercise per Week: Not on file  . Minutes of Exercise per Session: Not on file  Stress:   . Feeling of Stress : Not on file  Social Connections:   . Frequency of Communication with Friends and Family: Not on file  . Frequency of Social Gatherings with Friends and Family: Not on file  . Attends Religious Services: Not on file  . Active Member of Clubs or Organizations: Not on file  . Attends Archivist Meetings: Not on file  . Marital Status: Not on file  Intimate  Partner Violence:   . Fear of Current or Ex-Partner: Not on file  . Emotionally Abused: Not on file  . Physically Abused: Not on file  . Sexually Abused: Not on file    FAMILY HISTORY:  Family History  Problem Relation Age of Onset  . Heart failure Mother   . Heart failure Father   . Congestive Heart Failure Brother   . Tuberculosis Paternal Uncle   . Tuberculosis Maternal Grandmother   . Tuberculosis Maternal Grandfather   . Congestive Heart Failure Brother     CURRENT MEDICATIONS:  Outpatient Encounter Medications as of 10/29/2019  Medication Sig  . Ado-Trastuzumab Emtansine (KADCYLA IV) Inject into the vein every 21 ( twenty-one) days.  Marland Kitchen amLODipine (NORVASC) 5 MG tablet Take 5 mg by mouth daily.  . ciprofloxacin (CIPRO) 500 MG tablet Take 1 tablet (500 mg total) by mouth 2 (two) times daily.  . ferrous sulfate 325 (65 FE) MG tablet Take 325 mg by mouth daily with breakfast.  . labetalol (NORMODYNE) 100 MG tablet  Take 1 tablet (100 mg total) by mouth 2 (two) times daily.  Marland Kitchen oxybutynin (DITROPAN) 5 MG tablet Take 5 mg by mouth daily.   . simvastatin (ZOCOR) 40 MG tablet Take 40 mg by mouth daily.  . [DISCONTINUED] zolpidem (AMBIEN) 5 MG tablet Take 1 tablet (5 mg total) by mouth at bedtime as needed for sleep. (Patient taking differently: Take 5 mg by mouth at bedtime. )  . ALPRAZolam (XANAX) 0.5 MG tablet Take 1 tablet 30 min before PET (Patient not taking: Reported on 10/29/2019)  . HYDROcodone-acetaminophen (NORCO/VICODIN) 5-325 MG tablet Take 1 tablet by mouth every 6 (six) hours as needed for moderate pain or severe pain. (Patient not taking: Reported on 10/29/2019)  . methocarbamol (ROBAXIN) 500 MG tablet Take 1 tablet (500 mg total) by mouth every 6 (six) hours as needed for muscle spasms. (Patient not taking: Reported on 10/29/2019)  . temazepam (RESTORIL) 15 MG capsule Take 1 capsule (15 mg total) by mouth at bedtime as needed for sleep. May increase dose to 2 capsules if 1 capsule does not help.  . [DISCONTINUED] prochlorperazine (COMPAZINE) 10 MG tablet Take 1 tablet (10 mg total) by mouth every 6 (six) hours as needed (Nausea or vomiting). (Patient taking differently: Take 10 mg by mouth daily. )   No facility-administered encounter medications on file as of 10/29/2019.    ALLERGIES:  No Known Allergies   PHYSICAL EXAM:  ECOG Performance status: 1  Vitals:   10/29/19 1022  BP: (!) 104/50  Pulse: (!) 54  Resp: 18  Temp: (!) 97.1 F (36.2 C)  SpO2: 97%   Filed Weights   10/29/19 1022  Weight: 111 lb 12.8 oz (50.7 kg)    Physical Exam Constitutional:      Appearance: Normal appearance.  HENT:     Head: Normocephalic.     Nose: Nose normal.  Eyes:     Conjunctiva/sclera: Conjunctivae normal.  Cardiovascular:     Rate and Rhythm: Normal rate and regular rhythm.     Pulses: Normal pulses.     Heart sounds: Normal heart sounds.  Pulmonary:     Effort: Pulmonary effort is normal.      Breath sounds: Normal breath sounds.  Abdominal:     General: Bowel sounds are normal.  Musculoskeletal:        General: Normal range of motion.     Cervical back: Normal range of motion.  Skin:    General:  Skin is warm and dry.  Neurological:     General: No focal deficit present.     Mental Status: She is alert and oriented to person, place, and time.  Psychiatric:        Mood and Affect: Mood normal.        Behavior: Behavior normal.        Thought Content: Thought content normal.        Judgment: Judgment normal.      LABORATORY DATA:  I have reviewed the labs as listed.  CBC    Component Value Date/Time   WBC 7.4 10/29/2019 0951   RBC 3.80 (L) 10/29/2019 0951   HGB 11.8 (L) 10/29/2019 0951   HCT 36.9 10/29/2019 0951   PLT 250 10/29/2019 0951   MCV 97.1 10/29/2019 0951   MCH 31.1 10/29/2019 0951   MCHC 32.0 10/29/2019 0951   RDW 14.1 10/29/2019 0951   LYMPHSABS 0.9 10/29/2019 0951   MONOABS 0.5 10/29/2019 0951   EOSABS 0.6 (H) 10/29/2019 0951   BASOSABS 0.0 10/29/2019 0951   CMP Latest Ref Rng & Units 10/29/2019 10/22/2019 10/02/2019  Glucose 70 - 99 mg/dL 170(H) 132(H) 124(H)  BUN 8 - 23 mg/dL 36(H) 26(H) 27(H)  Creatinine 0.44 - 1.00 mg/dL 1.39(H) 1.10(H) 1.14(H)  Sodium 135 - 145 mmol/L 136 139 141  Potassium 3.5 - 5.1 mmol/L 4.5 4.0 3.9  Chloride 98 - 111 mmol/L 105 106 108  CO2 22 - 32 mmol/L 21(L) 24 23  Calcium 8.9 - 10.3 mg/dL 8.9 9.1 9.0  Total Protein 6.5 - 8.1 g/dL 6.3(L) 6.1(L) 6.1(L)  Total Bilirubin 0.3 - 1.2 mg/dL 0.6 0.7 0.9  Alkaline Phos 38 - 126 U/L 73 88 125  AST 15 - 41 U/L '25 25 30  ' ALT 0 - 44 U/L '14 15 20   ' I have independently reviewed her CT scan and discussed with the patient.   ASSESSMENT & PLAN:   Invasive ductal carcinoma of left breast ([facility]) 1.  Metastatic HER-2 positive left breast cancer: -8 cycles of Kadcyla from 03/12/2019 through 08/20/2019 with progression. -CT CAP on 10/14/2019 reviewed by me showed enlarging bilateral  pulmonary nodules, enlarging left infrahilar mass.  CA 15-3 has always been normal.  Left axillary lymph nodes are similar.  No metastatic disease in abdomen or pelvis. -Cycle 1 of dose reduced Enhertu on 10/22/2019. -She complained of decreased appetite since last week.  Appetite picked up last night.  She lost 2 pounds since last week. -She stayed nauseous all day Sunday.  She took Compazine which helped some. -I have reviewed her labs today.  White count and platelet count is normal.  2.  Urinary incontinence: -Her UA showed positive leukocytes and nitrates.  We have started her on Cipro.  Her symptoms have improved at this time.  3.  High risk drug monitoring: -Last echo from 07/02/2019 showed EF 60-65%.  We will schedule her another echocardiogram in 2 weeks.  4.  Left leg pain: -She has called orthopedics and [place] [place].  They want to give her second injection after her second Covid shot on the 18th of this month.  5.  Elevated creatinine: -Today her creatinine went up to 1.39.  It was previously 1.1 and normal. -This is likely from her new treatment.  I will give her normal saline today.  I have encouraged her to drink lots of water. -We will also give her Zofran 8 mg IV as she is complaining of nausea. -We will plan to  recheck her creatinine at next visit.     Orders placed this encounter:  No orders of the defined types were placed in this encounter.    Derek Jack, MD  [place] [phone]

## 2019-10-29 NOTE — Assessment & Plan Note (Signed)
1.  Metastatic HER-2 positive left breast cancer: -8 cycles of Kadcyla from 03/12/2019 through 08/20/2019 with progression. -CT CAP on 10/14/2019 reviewed by me showed enlarging bilateral pulmonary nodules, enlarging left infrahilar mass.  CA 15-3 has always been normal.  Left axillary lymph nodes are similar.  No metastatic disease in abdomen or pelvis. -Cycle 1 of dose reduced Enhertu on 10/22/2019. -She complained of decreased appetite since last week.  Appetite picked up last night.  She lost 2 pounds since last week. -She stayed nauseous all day Sunday.  She took Compazine which helped some. -I have reviewed her labs today.  White count and platelet count is normal.  2.  Urinary incontinence: -Her UA showed positive leukocytes and nitrates.  We have started her on Cipro.  Her symptoms have improved at this time.  3.  High risk drug monitoring: -Last echo from 07/02/2019 showed EF 60-65%.  We will schedule her another echocardiogram in 2 weeks.  4.  Left leg pain: -She has called orthopedics and [facility].  They want to give her second injection after her second Covid shot on the 18th of this month.  5.  Elevated creatinine: -Today her creatinine went up to 1.39.  It was previously 1.1 and normal. -This is likely from her new treatment.  I will give her normal saline today.  I have encouraged her to drink lots of water. -We will also give her Zofran 8 mg IV as she is complaining of nausea. -We will plan to recheck her creatinine at next visit.

## 2019-10-29 NOTE — Progress Notes (Signed)
Hydration fluids given today per MD. Patient tolerated it well without problems. Vitals stable and discharged home from clinic via wheelchair. Follow up as scheduled.

## 2019-11-04 ENCOUNTER — Other Ambulatory Visit (HOSPITAL_COMMUNITY): Payer: Medicare Other

## 2019-11-08 ENCOUNTER — Other Ambulatory Visit (HOSPITAL_COMMUNITY): Payer: Medicare Other

## 2019-11-11 NOTE — Telephone Encounter (Signed)
I called daughter, she has already scheduled at [facility].

## 2019-11-11 NOTE — Telephone Encounter (Signed)
Yes he ordered Lumbar ESI up to three. I will call [facility] and patient.

## 2019-11-12 ENCOUNTER — Inpatient Hospital Stay (HOSPITAL_COMMUNITY): Payer: Medicare Other

## 2019-11-12 ENCOUNTER — Ambulatory Visit (HOSPITAL_COMMUNITY)
Admission: RE | Admit: 2019-11-12 | Discharge: 2019-11-12 | Disposition: A | Discharge status: Home / Self Care | Payer: Medicare Other | Source: Ambulatory Visit | Attending: Hematology | Admitting: Hematology

## 2019-11-12 ENCOUNTER — Inpatient Hospital Stay (HOSPITAL_BASED_OUTPATIENT_CLINIC_OR_DEPARTMENT_OTHER): Payer: Medicare Other | Admitting: Hematology

## 2019-11-12 ENCOUNTER — Other Ambulatory Visit: Payer: Self-pay

## 2019-11-12 VITALS — BP 128/56 | HR 54 | Temp 96.4°F | Resp 18 | Wt 114.6 lb

## 2019-11-12 DIAGNOSIS — I361 Nonrheumatic tricuspid (valve) insufficiency: Secondary | ICD-10-CM

## 2019-11-12 DIAGNOSIS — R7989 Other specified abnormal findings of blood chemistry: Secondary | ICD-10-CM | POA: Diagnosis not present

## 2019-11-12 DIAGNOSIS — N3941 Urge incontinence: Secondary | ICD-10-CM | POA: Diagnosis not present

## 2019-11-12 DIAGNOSIS — Z5112 Encounter for antineoplastic immunotherapy: Secondary | ICD-10-CM | POA: Diagnosis not present

## 2019-11-12 DIAGNOSIS — C7801 Secondary malignant neoplasm of right lung: Secondary | ICD-10-CM | POA: Diagnosis not present

## 2019-11-12 DIAGNOSIS — C50912 Malignant neoplasm of unspecified site of left female breast: Secondary | ICD-10-CM | POA: Diagnosis not present

## 2019-11-12 DIAGNOSIS — R110 Nausea: Secondary | ICD-10-CM | POA: Diagnosis not present

## 2019-11-12 DIAGNOSIS — Z9012 Acquired absence of left breast and nipple: Secondary | ICD-10-CM | POA: Insufficient documentation

## 2019-11-12 DIAGNOSIS — C7802 Secondary malignant neoplasm of left lung: Secondary | ICD-10-CM | POA: Diagnosis not present

## 2019-11-12 DIAGNOSIS — I340 Nonrheumatic mitral (valve) insufficiency: Secondary | ICD-10-CM

## 2019-11-12 DIAGNOSIS — I10 Essential (primary) hypertension: Secondary | ICD-10-CM | POA: Insufficient documentation

## 2019-11-12 DIAGNOSIS — M79605 Pain in left leg: Secondary | ICD-10-CM | POA: Diagnosis not present

## 2019-11-12 DIAGNOSIS — R42 Dizziness and giddiness: Secondary | ICD-10-CM | POA: Diagnosis not present

## 2019-11-12 DIAGNOSIS — R197 Diarrhea, unspecified: Secondary | ICD-10-CM | POA: Diagnosis not present

## 2019-11-12 DIAGNOSIS — C50812 Malignant neoplasm of overlapping sites of left female breast: Secondary | ICD-10-CM | POA: Diagnosis not present

## 2019-11-12 DIAGNOSIS — E119 Type 2 diabetes mellitus without complications: Secondary | ICD-10-CM | POA: Diagnosis not present

## 2019-11-12 DIAGNOSIS — Z87442 Personal history of urinary calculi: Secondary | ICD-10-CM | POA: Diagnosis not present

## 2019-11-12 DIAGNOSIS — G479 Sleep disorder, unspecified: Secondary | ICD-10-CM | POA: Diagnosis not present

## 2019-11-12 DIAGNOSIS — Z8249 Family history of ischemic heart disease and other diseases of the circulatory system: Secondary | ICD-10-CM | POA: Diagnosis not present

## 2019-11-12 DIAGNOSIS — R5383 Other fatigue: Secondary | ICD-10-CM | POA: Diagnosis not present

## 2019-11-12 DIAGNOSIS — Z836 Family history of other diseases of the respiratory system: Secondary | ICD-10-CM | POA: Diagnosis not present

## 2019-11-12 DIAGNOSIS — Z79899 Other long term (current) drug therapy: Secondary | ICD-10-CM | POA: Diagnosis not present

## 2019-11-12 LAB — COMPREHENSIVE METABOLIC PANEL
ALT: 16 U/L (ref 0–44)
AST: 22 U/L (ref 15–41)
Albumin: 3.2 g/dL — ABNORMAL LOW (ref 3.5–5.0)
Alkaline Phosphatase: 67 U/L (ref 38–126)
Anion gap: 9 (ref 5–15)
BUN: 22 mg/dL (ref 8–23)
CO2: 23 mmol/L (ref 22–32)
Calcium: 8.7 mg/dL — ABNORMAL LOW (ref 8.9–10.3)
Chloride: 107 mmol/L (ref 98–111)
Creatinine, Ser: 0.95 mg/dL (ref 0.44–1.00)
GFR calc Af Amer: >60 mL/min (ref >60)
GFR calc non Af Amer: 52 mL/min — ABNORMAL LOW (ref >60)
Glucose, Bld: 122 mg/dL — ABNORMAL HIGH (ref 70–99)
Potassium: 3.9 mmol/L (ref 3.5–5.1)
Sodium: 139 mmol/L (ref 135–145)
Total Bilirubin: 0.5 mg/dL (ref 0.3–1.2)
Total Protein: 5.9 g/dL — ABNORMAL LOW (ref 6.5–8.1)

## 2019-11-12 LAB — CBC WITH DIFFERENTIAL/PLATELET
Abs Immature Granulocytes: 0.01 10*3/uL (ref 0.00–0.07)
Basophils Absolute: 0 10*3/uL (ref 0.0–0.1)
Basophils Relative: 1 %
Eosinophils Absolute: 0.4 10*3/uL (ref 0.0–0.5)
Eosinophils Relative: 7 %
HCT: 34 % — ABNORMAL LOW (ref 36.0–46.0)
Hemoglobin: 10.8 g/dL — ABNORMAL LOW (ref 12.0–15.0)
Immature Granulocytes: 0 %
Lymphocytes Relative: 16 %
Lymphs Abs: 0.8 10*3/uL (ref 0.7–4.0)
MCH: 31.2 pg (ref 26.0–34.0)
MCHC: 31.8 g/dL (ref 30.0–36.0)
MCV: 98.3 fL (ref 80.0–100.0)
Monocytes Absolute: 0.5 10*3/uL (ref 0.1–1.0)
Monocytes Relative: 10 %
Neutro Abs: 3.4 10*3/uL (ref 1.7–7.7)
Neutrophils Relative %: 66 %
Platelets: 216 10*3/uL (ref 150–400)
RBC: 3.46 MIL/uL — ABNORMAL LOW (ref 3.87–5.11)
RDW: 14.9 % (ref 11.5–15.5)
WBC: 5.1 10*3/uL (ref 4.0–10.5)
nRBC: 0 % (ref 0.0–0.2)

## 2019-11-12 MED ORDER — DIPHENHYDRAMINE HCL 25 MG PO CAPS
25.0000 mg | ORAL_CAPSULE | Freq: Once | ORAL | Status: AC
  Administered 2019-11-12: 25 mg via ORAL

## 2019-11-12 MED ORDER — ACETAMINOPHEN 325 MG PO TABS
ORAL_TABLET | ORAL | Status: AC
  Filled 2019-11-12: qty 2

## 2019-11-12 MED ORDER — DEXTROSE 5 % IV SOLN: Freq: Once | INTRAVENOUS | Status: AC

## 2019-11-12 MED ORDER — TEMAZEPAM 30 MG PO CAPS: 30.0000 mg | ORAL_CAPSULE | Freq: Every evening | ORAL | 1 refills | Status: DC | PRN

## 2019-11-12 MED ORDER — PALONOSETRON HCL INJECTION 0.25 MG/5ML
0.2500 mg | Freq: Once | INTRAVENOUS | Status: AC
  Administered 2019-11-12: 11:00:00 0.25 mg via INTRAVENOUS

## 2019-11-12 MED ORDER — ACETAMINOPHEN 325 MG PO TABS
650.0000 mg | ORAL_TABLET | Freq: Once | ORAL | Status: AC
  Administered 2019-11-12: 650 mg via ORAL

## 2019-11-12 MED ORDER — SODIUM CHLORIDE 0.9 % IV SOLN
10.0000 mg | Freq: Once | INTRAVENOUS | Status: AC
  Administered 2019-11-12: 10 mg via INTRAVENOUS
  Filled 2019-11-12: qty 10

## 2019-11-12 MED ORDER — SODIUM CHLORIDE 0.9% FLUSH
10.0000 mL | INTRAVENOUS | Status: DC | PRN
  Administered 2019-11-12: 10 mL

## 2019-11-12 MED ORDER — HEPARIN SOD (PORK) LOCK FLUSH 100 UNIT/ML IV SOLN
500.0000 [IU] | Freq: Once | INTRAVENOUS | Status: AC | PRN
  Administered 2019-11-12: 500 [IU]

## 2019-11-12 MED ORDER — FAM-TRASTUZUMAB DERUXTECAN-NXKI CHEMO 100 MG IV SOLR
3.2000 mg/kg | Freq: Once | INTRAVENOUS | Status: AC
  Administered 2019-11-12: 168 mg via INTRAVENOUS
  Filled 2019-11-12: qty 8.4

## 2019-11-12 MED ORDER — DIPHENHYDRAMINE HCL 25 MG PO CAPS
ORAL_CAPSULE | ORAL | Status: AC
  Filled 2019-11-12: qty 1

## 2019-11-12 MED ORDER — PALONOSETRON HCL INJECTION 0.25 MG/5ML
INTRAVENOUS | Status: AC
  Filled 2019-11-12: qty 5

## 2019-11-12 NOTE — Progress Notes (Signed)
[place], [place] [address]   CLINIC:  Medical Oncology/Hematology  PCP:  [name], MD [place] [address] [phone]   REASON FOR VISIT:  Follow-up for Breast Cancer   CURRENT THERAPY: Enhertu  BRIEF ONCOLOGIC HISTORY:  Oncology History  Invasive ductal carcinoma of left breast ([place])  03/30/2017 Initial Diagnosis   Invasive ductal carcinoma of left breast ([place])   08/09/2018 - 03/20/2019 Chemotherapy   The patient had trastuzumab (HERCEPTIN) 500 mg in sodium chloride 0.9 % 250 mL chemo infusion, 525 mg, Intravenous,  Once, 10 of 10 cycles Administration: 500 mg (08/09/2018), 350 mg (09/06/2018), 350 mg (09/27/2018), 378 mg (10/18/2018), 378 mg (11/15/2018), 378 mg (12/06/2018), 378 mg (12/28/2018), 378 mg (01/18/2019), 378 mg (02/08/2019) pertuzumab (PERJETA) 840 mg in sodium chloride 0.9 % 250 mL chemo infusion, 840 mg (100 % of original dose 840 mg), Intravenous, Once, 6 of 6 cycles Dose modification: 840 mg (original dose 840 mg, Cycle 5), 420 mg (original dose 420 mg, Cycle 6) Administration: 840 mg (11/15/2018), 420 mg (12/06/2018), 420 mg (12/28/2018), 420 mg (01/18/2019), 420 mg (02/08/2019)  for chemotherapy treatment.    09/18/2018 Genetic Testing   Negative genetic testing for the breast and gyn cancer panel.  The Breast/GYN gene panel offered by [facility] includes sequencing and rearrangement analysis for the following 23 genes:  ATM, BRCA1, BRCA2, BRIP1, CDH1, CHEK2, EPCAM, MLH1, MSH2, MSH6, NBN, NF1, PALB2, PMS2, PTEN, RAD51C, RAD51D, STK11, and TP53.   The report date is 09/18/2018.   03/12/2019 - 09/09/2019 Chemotherapy   The patient had ado-trastuzumab emtansine (KADCYLA) 140 mg in sodium chloride 0.9 % 250 mL chemo infusion, 2.4 mg/kg = 140 mg (100 % of original dose 2.4 mg/kg), Intravenous, Once, 8 of 9 cycles Dose modification: 2.4 mg/kg (original dose 2.4 mg/kg, Cycle 1, Reason: Patient Age), 3 mg/kg (original dose  2.4 mg/kg, Cycle 3, Reason: Provider Judgment) Administration: 140 mg (03/12/2019), 180 mg (04/23/2019), 180 mg (06/18/2019), 180 mg (04/02/2019), 180 mg (05/28/2019), 180 mg (07/09/2019), 180 mg (07/30/2019), 180 mg (08/20/2019)  for chemotherapy treatment.    10/22/2019 -  Chemotherapy   The patient had palonosetron (ALOXI) injection 0.25 mg, 0.25 mg, Intravenous,  Once, 2 of 6 cycles Administration: 0.25 mg (10/22/2019) fam-trastuzumab deruxtecan-nxki (ENHERTU) 168 mg in dextrose 5 % 100 mL chemo infusion, 3.2 mg/kg = 168 mg (100 % of original dose 3.2 mg/kg), Intravenous,  Once, 2 of 6 cycles Dose modification: 3.2 mg/kg (original dose 3.2 mg/kg, Cycle 1, Reason: Provider Judgment), 3.2 mg/kg (original dose 3.2 mg/kg, Cycle 2, Reason: Patient Age) Administration: 168 mg (10/22/2019)  for chemotherapy treatment.       CANCER STAGING: Cancer Staging No matching staging information was found for the patient.   INTERVAL HISTORY:  Ms. Beedy 86 y.o. female seen for follow-up and toxicity assessment prior to cycle 2 of her treatment for metastatic breast cancer.  Appetite is 50%.  Energy levels are low.  Reports some pain in the left leg which is constant.  She also reports difficulty going back to sleep after she wakes up at 12:30 AM.  She is taking mirtazapine 15 mg at bedtime.  She took her second Covid vaccine yesterday.  Does not report any generalized body pains.  REVIEW OF SYSTEMS:  Review of Systems  Psychiatric/Behavioral: Positive for sleep disturbance.  All other systems reviewed and are negative.    PAST MEDICAL/SURGICAL HISTORY:  Past Medical History:  Diagnosis Date  .  Cancer ([place])    left breast  . History of kidney stones   . Hypercholesteremia   . Hypertension    Past Surgical History:  Procedure Laterality Date  . APPENDECTOMY    . brain cyst removed  2011  . BREAST BIOPSY Left 03/28/2018   Procedure: BREAST BIOPSY;  Surgeon: [name], MD;  Location: [facility];   Service: General;  Laterality: Left;  . CATARACT EXTRACTION W/PHACO Left 11/09/2015   Procedure: CATARACT EXTRACTION PHACO AND INTRAOCULAR LENS PLACEMENT LEFT EYE cde=8.97;  Surgeon: [name], MD;  Location: [facility];  Service: Ophthalmology;  Laterality: Left;  . CATARACT EXTRACTION W/PHACO Right 02/04/2019   Procedure: CATARACT EXTRACTION PHACO AND INTRAOCULAR LENS PLACEMENT (IOC);  Surgeon: [name], MD;  Location: [facility];  Service: Ophthalmology;  Laterality: Right;  CDE: 15.19  . EYE SURGERY     KPE left  . MASTECTOMY MODIFIED RADICAL Left 05/28/2018   Procedure: LEFT MODIFIED RADICAL MASTECTOMY;  Surgeon: [name], MD;  Location: [facility];  Service: General;  Laterality: Left;  Marland Kitchen MASTECTOMY, PARTIAL Left 04/24/2017   Procedure: MASTECTOMY PARTIAL;  Surgeon: [name], MD;  Location: [facility];  Service: General;  Laterality: Left;  . ORIF FEMUR FRACTURE Left 09/03/2019   Procedure: OPEN REDUCTION INTERNAL FIXATION (ORIF) LEFT HIP FEMUR FRACTURE;  Surgeon: Paralee Cancel, MD;  Location: [facility];  Service: Orthopedics;  Laterality: Left;  . PORTACATH PLACEMENT Right 08/08/2018   Procedure: INSERTION PORT-A-CATH;  Surgeon: [name], MD;  Location: [facility];  Service: General;  Laterality: Right;  . TONSILLECTOMY    . TONSILLECTOMY AND ADENOIDECTOMY       SOCIAL HISTORY:  Social History   Socioeconomic History  . Marital status: Widowed    Spouse name: Not on file  . Number of children: Not on file  . Years of education: Not on file  . Highest education level: Not on file  Occupational History  . Not on file  Tobacco Use  . Smoking status: Never Smoker  . Smokeless tobacco: Never Used  Substance and Sexual Activity  . Alcohol use: No  . Drug use: No  . Sexual activity: Not Currently    Partners: Male  Other Topics Concern  . Not on file  Social History Narrative  . Not on file   Social Determinants of Health   Financial Resource Strain:   . Difficulty of Paying  Living Expenses:   Food Insecurity:   . Worried About Charity fundraiser in the Last Year:   . Arboriculturist in the Last Year:   Transportation Needs:   . Film/video editor (Medical):   Marland Kitchen Lack of Transportation (Non-Medical):   Physical Activity:   . Days of Exercise per Week:   . Minutes of Exercise per Session:   Stress:   . Feeling of Stress :   Social Connections:   . Frequency of Communication with Friends and Family:   . Frequency of Social Gatherings with Friends and Family:   . Attends Religious Services:   . Active Member of Clubs or Organizations:   . Attends Archivist Meetings:   Marland Kitchen Marital Status:   Intimate Partner Violence:   . Fear of Current or Ex-Partner:   . Emotionally Abused:   Marland Kitchen Physically Abused:   . Sexually Abused:     FAMILY HISTORY:  Family History  Problem Relation Age of Onset  . Heart failure Mother   . Heart failure Father   .  Congestive Heart Failure Brother   . Tuberculosis Paternal Uncle   . Tuberculosis Maternal Grandmother   . Tuberculosis Maternal Grandfather   . Congestive Heart Failure Brother     CURRENT MEDICATIONS:  Outpatient Encounter Medications as of 11/12/2019  Medication Sig  . Ado-Trastuzumab Emtansine (KADCYLA IV) Inject into the vein every 21 ( twenty-one) days.  Marland Kitchen amLODipine (NORVASC) 5 MG tablet Take 5 mg by mouth daily.  . ferrous sulfate 325 (65 FE) MG tablet Take 325 mg by mouth daily with breakfast.  . labetalol (NORMODYNE) 100 MG tablet Take 1 tablet (100 mg total) by mouth 2 (two) times daily.  Marland Kitchen oxybutynin (DITROPAN) 5 MG tablet Take 5 mg by mouth daily.   . simvastatin (ZOCOR) 40 MG tablet Take 40 mg by mouth daily.  . temazepam (RESTORIL) 30 MG capsule Take 1 capsule (30 mg total) by mouth at bedtime as needed for sleep. May increase dose to 2 capsules if 1 capsule does not help.  . [DISCONTINUED] prochlorperazine (COMPAZINE) 10 MG tablet Take 1 tablet (10 mg total) by mouth every 6 (six)  hours as needed (Nausea or vomiting). (Patient taking differently: Take 10 mg by mouth daily. )  . [DISCONTINUED] temazepam (RESTORIL) 15 MG capsule Take 1 capsule (15 mg total) by mouth at bedtime as needed for sleep. May increase dose to 2 capsules if 1 capsule does not help.  Marland Kitchen ALPRAZolam (XANAX) 0.5 MG tablet Take 1 tablet 30 min before PET (Patient not taking: Reported on 10/29/2019)  . HYDROcodone-acetaminophen (NORCO/VICODIN) 5-325 MG tablet Take 1 tablet by mouth every 6 (six) hours as needed for moderate pain or severe pain. (Patient not taking: Reported on 10/29/2019)  . methocarbamol (ROBAXIN) 500 MG tablet Take 1 tablet (500 mg total) by mouth every 6 (six) hours as needed for muscle spasms. (Patient not taking: Reported on 10/29/2019)  . [DISCONTINUED] ciprofloxacin (CIPRO) 500 MG tablet Take 1 tablet (500 mg total) by mouth 2 (two) times daily.   No facility-administered encounter medications on file as of 11/12/2019.    ALLERGIES:  No Known Allergies   PHYSICAL EXAM:  ECOG Performance status: 1  There were no vitals filed for this visit. There were no vitals filed for this visit.  Physical Exam Constitutional:      Appearance: Normal appearance.  HENT:     Head: Normocephalic.     Nose: Nose normal.  Eyes:     Conjunctiva/sclera: Conjunctivae normal.  Cardiovascular:     Rate and Rhythm: Normal rate and regular rhythm.     Pulses: Normal pulses.     Heart sounds: Normal heart sounds.  Pulmonary:     Effort: Pulmonary effort is normal.     Breath sounds: Normal breath sounds.  Abdominal:     General: Bowel sounds are normal.  Musculoskeletal:        General: Normal range of motion.     Cervical back: Normal range of motion.  Skin:    General: Skin is warm and dry.  Neurological:     General: No focal deficit present.     Mental Status: She is alert and oriented to person, place, and time.  Psychiatric:        Mood and Affect: Mood normal.        Behavior:  Behavior normal.        Thought Content: Thought content normal.        Judgment: Judgment normal.      LABORATORY DATA:  I have  reviewed the labs as listed.  CBC    Component Value Date/Time   WBC 5.1 11/12/2019 0926   RBC 3.46 (L) 11/12/2019 0926   HGB 10.8 (L) 11/12/2019 0926   HCT 34.0 (L) 11/12/2019 0926   PLT 216 11/12/2019 0926   MCV 98.3 11/12/2019 0926   MCH 31.2 11/12/2019 0926   MCHC 31.8 11/12/2019 0926   RDW 14.9 11/12/2019 0926   LYMPHSABS 0.8 11/12/2019 0926   MONOABS 0.5 11/12/2019 0926   EOSABS 0.4 11/12/2019 0926   BASOSABS 0.0 11/12/2019 0926   CMP Latest Ref Rng & Units 11/12/2019 10/29/2019 10/22/2019  Glucose 70 - 99 mg/dL 122(H) 170(H) 132(H)  BUN 8 - 23 mg/dL 22 36(H) 26(H)  Creatinine 0.44 - 1.00 mg/dL 0.95 1.39(H) 1.10(H)  Sodium 135 - 145 mmol/L 139 136 139  Potassium 3.5 - 5.1 mmol/L 3.9 4.5 4.0  Chloride 98 - 111 mmol/L 107 105 106  CO2 22 - 32 mmol/L 23 21(L) 24  Calcium 8.9 - 10.3 mg/dL 8.7(L) 8.9 9.1  Total Protein 6.5 - 8.1 g/dL 5.9(L) 6.3(L) 6.1(L)  Total Bilirubin 0.3 - 1.2 mg/dL 0.5 0.6 0.7  Alkaline Phos 38 - 126 U/L 67 73 88  AST 15 - 41 U/L _0 ALT 0 - 44 U/L _1 I have independently reviewed the scans.   ASSESSMENT & PLAN:   Invasive ductal carcinoma of left breast ([place]) 1.  Metastatic HER-2 positive left breast cancer: -8 cycles of Kadcyla from 03/12/2019 through 08/20/2019 with progression. -CT CAP on 10/14/2019 reviewed by me showed enlarging bilateral pulmonary nodules, enlarging left infrahilar mass.  Left axillary lymph nodes are similar.  No metastatic disease in the abdomen or pelvis. -CA 15-3 was normal. -Cycle 1 of reduced dose Enhertu on 10/22/2019. -She lost appetite and felt nauseous for 1 day after last treatment. -She gained some weight back.  We reviewed her labs.  LFTs are within normal limits.  White count and platelets are normal. -She will proceed with cycle 2 of Enhertu today.  We will reevaluate  her in 3 weeks.  2.  High risk drug monitoring: -Echo on 07/02/2019 showed EF 60 to 65%. -Repeat echo on 11/12/2018 shows EF 60-65%.  3.  Left leg pain: -She is complaining of constant left leg pain which improved with the injection previously. -She has an appointment for another injection on 11/19/2019 in [place].  4.  Difficulty sleeping: -She is waking up at 12:30 and cannot go back to sleep. -I will increase her mirtazapine to 30 mg at bedtime.  5.  Elevated creatinine: -She had elevated creatinine 1 week after treatment. -Today it has improved to normal.    Orders placed this encounter:  Orders Placed This Encounter  Procedures  . CBC with Differential/Platelet  . Comprehensive metabolic panel  . Cancer antigen 15-3     Derek Jack, MD  [facility] [phone]

## 2019-11-12 NOTE — Assessment & Plan Note (Signed)
1.  Metastatic HER-2 positive left breast cancer: -8 cycles of Kadcyla from 03/12/2019 through 08/20/2019 with progression. -CT CAP on 10/14/2019 reviewed by me showed enlarging bilateral pulmonary nodules, enlarging left infrahilar mass.  Left axillary lymph nodes are similar.  No metastatic disease in the abdomen or pelvis. -CA 15-3 was normal. -Cycle 1 of reduced dose Enhertu on 10/22/2019. -She lost appetite and felt nauseous for 1 day after last treatment. -She gained some weight back.  We reviewed her labs.  LFTs are within normal limits.  White count and platelets are normal. -She will proceed with cycle 2 of Enhertu today.  We will reevaluate her in 3 weeks.  2.  High risk drug monitoring: -Echo on 07/02/2019 showed EF 60 to 65%. -Repeat echo on 11/12/2018 shows EF 60-65%.  3.  Left leg pain: -She is complaining of constant left leg pain which improved with the injection previously. -She has an appointment for another injection on 11/19/2019 in [place].  4.  Difficulty sleeping: -She is waking up at 12:30 and cannot go back to sleep. -I will increase her mirtazapine to 30 mg at bedtime.  5.  Elevated creatinine: -She had elevated creatinine 1 week after treatment. -Today it has improved to normal.

## 2019-11-12 NOTE — Patient Instructions (Signed)
[facility] at [facility] Discharge Instructions  Labs drawn from portacath today   Thank you for choosing [facility] at [facility] to provide your oncology and hematology care.  To afford each patient quality time with our provider, please arrive at least 15 minutes before your scheduled appointment time.   If you have a lab appointment with the Cancer Center please come in thru the Main Entrance and check in at the main information desk.  You need to re-schedule your appointment should you arrive 10 or more minutes late.  We strive to give you quality time with our providers, and arriving late affects you and other patients whose appointments are after yours.  Also, if you no show three or more times for appointments you may be dismissed from the clinic at the providers discretion.     Again, thank you for choosing [facility].  Our hope is that these requests will decrease the amount of time that you wait before being seen by our physicians.       _____________________________________________________________  Should you have questions after your visit to  Cancer Center, please contact our office at [phone] between the hours of 8:00 a.m. and 4:30 p.m.  Voicemails left after 4:00 p.m. will not be returned until the following business day.  For prescription refill requests, have your pharmacy contact our office and allow 72 hours.    Due to Covid, you will need to wear a mask upon entering the hospital. If you do not have a mask, a mask will be given to you at the Main Entrance upon arrival. For doctor visits, patients may have 1 support person with them. For treatment visits, patients can not have anyone with them due to social distancing guidelines and our immunocompromised population.     

## 2019-11-12 NOTE — Patient Instructions (Signed)
[facility] Discharge Instructions for Patients Receiving Chemotherapy  Today you received the following chemotherapy agents   To help prevent nausea and vomiting after your treatment, we encourage you to take your nausea medication   If you develop nausea and vomiting that is not controlled by your nausea medication, call the clinic.   BELOW ARE SYMPTOMS THAT SHOULD BE REPORTED IMMEDIATELY:  *FEVER GREATER THAN 100.5 F  *CHILLS WITH OR WITHOUT FEVER  NAUSEA AND VOMITING THAT IS NOT CONTROLLED WITH YOUR NAUSEA MEDICATION  *UNUSUAL SHORTNESS OF BREATH  *UNUSUAL BRUISING OR BLEEDING  TENDERNESS IN MOUTH AND THROAT WITH OR WITHOUT PRESENCE OF ULCERS  *URINARY PROBLEMS  *BOWEL PROBLEMS  UNUSUAL RASH Items with * indicate a potential emergency and should be followed up as soon as possible.  Feel free to call the clinic should you have any questions or concerns. The clinic phone number is [phone].  Please show the CHEMO ALERT CARD at check-in to the Emergency Department and triage nurse.   

## 2019-11-12 NOTE — Progress Notes (Signed)
Patient presents today for treatment and follow up visit with Dr. Delton Coombes. Vital signs within parameters for treatment. Labs pending. Patient has no complaints of any changes since her last treatment. MAR reviewed.   Message received from [facility] LPN/ Dr. Delton Coombes proceed with treatment. Labs reviewed and within parameters for today's treatment.   Treatment given today per MD orders. Tolerated infusion without adverse affects. Vital signs stable. No complaints at this time. Discharged from clinic via wheel chair. F/U with [facility] as scheduled.

## 2019-11-12 NOTE — Progress Notes (Signed)
Patient has been assessed, vital signs and labs have been reviewed by Dr. Katragadda. ANC, Creatinine, LFTs, and Platelets are within treatment parameters per Dr. Katragadda. The patient is good to proceed with treatment at this time.  

## 2019-11-12 NOTE — Patient Instructions (Signed)
[facility] at [facility] Discharge Instructions  You were seen today by Dr. Katragadda. He went over your recent lab results. He will see you back in 3 weeks for labs, treatment and follow up.   Thank you for choosing [facility] at [facility] to provide your oncology and hematology care.  To afford each patient quality time with our provider, please arrive at least 15 minutes before your scheduled appointment time.   If you have a lab appointment with the Cancer Center please come in thru the  Main Entrance and check in at the main information desk  You need to re-schedule your appointment should you arrive 10 or more minutes late.  We strive to give you quality time with our providers, and arriving late affects you and other patients whose appointments are after yours.  Also, if you no show three or more times for appointments you may be dismissed from the clinic at the providers discretion.     Again, thank you for choosing [facility].  Our hope is that these requests will decrease the amount of time that you wait before being seen by our physicians.       _____________________________________________________________  Should you have questions after your visit to [facility], please contact our office at [phone] between the hours of 8:00 a.m. and 4:30 p.m.  Voicemails left after 4:00 p.m. will not be returned until the following business day.  For prescription refill requests, have your pharmacy contact our office and allow 72 hours.    Cancer Center Support Programs:   > Cancer Support Group  2nd Tuesday of the month 1pm-2pm, Journey Room    

## 2019-11-12 NOTE — Progress Notes (Signed)
*  PRELIMINARY RESULTS* Echocardiogram 2D Echocardiogram has been performed.  Carol Duarte 11/12/2019, 9:13 AM

## 2019-11-19 ENCOUNTER — Other Ambulatory Visit: Payer: Self-pay

## 2019-11-19 ENCOUNTER — Ambulatory Visit
Admission: RE | Admit: 2019-11-19 | Discharge: 2019-11-19 | Disposition: A | Discharge status: Home / Self Care | Payer: Medicare Other | Source: Ambulatory Visit | Attending: Orthopedic Surgery | Admitting: Orthopedic Surgery

## 2019-11-19 ENCOUNTER — Other Ambulatory Visit: Payer: Self-pay | Admitting: Orthopedic Surgery

## 2019-11-19 DIAGNOSIS — M5410 Radiculopathy, site unspecified: Secondary | ICD-10-CM

## 2019-11-19 DIAGNOSIS — M5416 Radiculopathy, lumbar region: Secondary | ICD-10-CM | POA: Diagnosis not present

## 2019-11-19 MED ORDER — IOPAMIDOL (ISOVUE-M 200) INJECTION 41%
1.0000 mL | Freq: Once | INTRAMUSCULAR | Status: AC
  Administered 2019-11-19: 1 mL via EPIDURAL

## 2019-11-19 MED ORDER — METHYLPREDNISOLONE ACETATE 40 MG/ML INJ SUSP (RADIOLOG
120.0000 mg | Freq: Once | INTRAMUSCULAR | Status: AC
  Administered 2019-11-19: 120 mg via EPIDURAL

## 2019-11-19 NOTE — Discharge Instructions (Signed)

## 2019-11-25 ENCOUNTER — Other Ambulatory Visit (HOSPITAL_COMMUNITY): Payer: Self-pay | Admitting: *Deleted

## 2019-11-25 DIAGNOSIS — C50912 Malignant neoplasm of unspecified site of left female breast: Secondary | ICD-10-CM

## 2019-11-27 NOTE — Progress Notes (Signed)
.  Pharmacist Chemotherapy Monitoring - Follow Up Assessment    I verify that I have reviewed each item in the below checklist:  . Regimen for the patient is scheduled for the appropriate day and plan matches scheduled date. Marland Kitchen Appropriate non-routine labs are ordered dependent on drug ordered. . If applicable, additional medications reviewed and ordered per protocol based on lifetime cumulative doses and/or treatment regimen.   Plan for follow-up and/or issues identified: No . I-vent associated with next due treatment: No . MD and/or nursing notified: No  Carol Duarte 11/27/2019 12:47 PM

## 2019-12-03 ENCOUNTER — Inpatient Hospital Stay (HOSPITAL_COMMUNITY): Payer: Medicare Other

## 2019-12-03 ENCOUNTER — Inpatient Hospital Stay (HOSPITAL_COMMUNITY): Payer: Medicare Other | Attending: Hematology | Admitting: Hematology

## 2019-12-03 ENCOUNTER — Encounter (HOSPITAL_COMMUNITY): Payer: Self-pay | Admitting: Hematology

## 2019-12-03 ENCOUNTER — Encounter (HOSPITAL_COMMUNITY): Payer: Self-pay

## 2019-12-03 ENCOUNTER — Other Ambulatory Visit: Payer: Self-pay

## 2019-12-03 VITALS — BP 144/64 | HR 55 | Temp 96.5°F | Resp 18

## 2019-12-03 DIAGNOSIS — R634 Abnormal weight loss: Secondary | ICD-10-CM | POA: Insufficient documentation

## 2019-12-03 DIAGNOSIS — Z836 Family history of other diseases of the respiratory system: Secondary | ICD-10-CM | POA: Diagnosis not present

## 2019-12-03 DIAGNOSIS — Z79899 Other long term (current) drug therapy: Secondary | ICD-10-CM | POA: Insufficient documentation

## 2019-12-03 DIAGNOSIS — C50812 Malignant neoplasm of overlapping sites of left female breast: Secondary | ICD-10-CM | POA: Diagnosis not present

## 2019-12-03 DIAGNOSIS — C50912 Malignant neoplasm of unspecified site of left female breast: Secondary | ICD-10-CM

## 2019-12-03 DIAGNOSIS — G479 Sleep disorder, unspecified: Secondary | ICD-10-CM | POA: Insufficient documentation

## 2019-12-03 DIAGNOSIS — Z87442 Personal history of urinary calculi: Secondary | ICD-10-CM | POA: Diagnosis not present

## 2019-12-03 DIAGNOSIS — Z5112 Encounter for antineoplastic immunotherapy: Secondary | ICD-10-CM | POA: Diagnosis not present

## 2019-12-03 DIAGNOSIS — Z8249 Family history of ischemic heart disease and other diseases of the circulatory system: Secondary | ICD-10-CM | POA: Insufficient documentation

## 2019-12-03 DIAGNOSIS — R630 Anorexia: Secondary | ICD-10-CM | POA: Insufficient documentation

## 2019-12-03 DIAGNOSIS — R918 Other nonspecific abnormal finding of lung field: Secondary | ICD-10-CM | POA: Insufficient documentation

## 2019-12-03 DIAGNOSIS — R5383 Other fatigue: Secondary | ICD-10-CM | POA: Diagnosis not present

## 2019-12-03 DIAGNOSIS — M79605 Pain in left leg: Secondary | ICD-10-CM | POA: Diagnosis not present

## 2019-12-03 LAB — COMPREHENSIVE METABOLIC PANEL
ALT: 18 U/L (ref 0–44)
AST: 20 U/L (ref 15–41)
Albumin: 3.5 g/dL (ref 3.5–5.0)
Alkaline Phosphatase: 58 U/L (ref 38–126)
Anion gap: 8 (ref 5–15)
BUN: 27 mg/dL — ABNORMAL HIGH (ref 8–23)
CO2: 24 mmol/L (ref 22–32)
Calcium: 9 mg/dL (ref 8.9–10.3)
Chloride: 108 mmol/L (ref 98–111)
Creatinine, Ser: 0.92 mg/dL (ref 0.44–1.00)
GFR calc Af Amer: >60 mL/min (ref >60)
GFR calc non Af Amer: 54 mL/min — ABNORMAL LOW (ref >60)
Glucose, Bld: 118 mg/dL — ABNORMAL HIGH (ref 70–99)
Potassium: 4.2 mmol/L (ref 3.5–5.1)
Sodium: 140 mmol/L (ref 135–145)
Total Bilirubin: 0.5 mg/dL (ref 0.3–1.2)
Total Protein: 6.1 g/dL — ABNORMAL LOW (ref 6.5–8.1)

## 2019-12-03 LAB — CBC WITH DIFFERENTIAL/PLATELET
Abs Immature Granulocytes: 0.01 10*3/uL (ref 0.00–0.07)
Basophils Absolute: 0.1 10*3/uL (ref 0.0–0.1)
Basophils Relative: 1 %
Eosinophils Absolute: 0.1 10*3/uL (ref 0.0–0.5)
Eosinophils Relative: 3 %
HCT: 36.3 % (ref 36.0–46.0)
Hemoglobin: 11.3 g/dL — ABNORMAL LOW (ref 12.0–15.0)
Immature Granulocytes: 0 %
Lymphocytes Relative: 25 %
Lymphs Abs: 1.1 10*3/uL (ref 0.7–4.0)
MCH: 31 pg (ref 26.0–34.0)
MCHC: 31.1 g/dL (ref 30.0–36.0)
MCV: 99.7 fL (ref 80.0–100.0)
Monocytes Absolute: 0.4 10*3/uL (ref 0.1–1.0)
Monocytes Relative: 10 %
Neutro Abs: 2.5 10*3/uL (ref 1.7–7.7)
Neutrophils Relative %: 61 %
Platelets: 197 10*3/uL (ref 150–400)
RBC: 3.64 MIL/uL — ABNORMAL LOW (ref 3.87–5.11)
RDW: 15.7 % — ABNORMAL HIGH (ref 11.5–15.5)
WBC: 4.2 10*3/uL (ref 4.0–10.5)
nRBC: 0 % (ref 0.0–0.2)

## 2019-12-03 MED ORDER — ACETAMINOPHEN 325 MG PO TABS
650.0000 mg | ORAL_TABLET | Freq: Once | ORAL | Status: AC
  Administered 2019-12-03: 650 mg via ORAL
  Filled 2019-12-03: qty 2

## 2019-12-03 MED ORDER — HEPARIN SOD (PORK) LOCK FLUSH 100 UNIT/ML IV SOLN
500.0000 [IU] | Freq: Once | INTRAVENOUS | Status: AC | PRN
  Administered 2019-12-03: 500 [IU]

## 2019-12-03 MED ORDER — SODIUM CHLORIDE 0.9% FLUSH
10.0000 mL | INTRAVENOUS | Status: DC | PRN
  Administered 2019-12-03: 10 mL

## 2019-12-03 MED ORDER — TEMAZEPAM 30 MG PO CAPS: 30.0000 mg | ORAL_CAPSULE | Freq: Every evening | ORAL | 1 refills | Status: DC | PRN

## 2019-12-03 MED ORDER — SODIUM CHLORIDE 0.9 % IV SOLN
10.0000 mg | Freq: Once | INTRAVENOUS | Status: AC
  Administered 2019-12-03: 10 mg via INTRAVENOUS
  Filled 2019-12-03: qty 10

## 2019-12-03 MED ORDER — DEXTROSE 5 % IV SOLN: Freq: Once | INTRAVENOUS | Status: AC

## 2019-12-03 MED ORDER — FAM-TRASTUZUMAB DERUXTECAN-NXKI CHEMO 100 MG IV SOLR
3.2000 mg/kg | Freq: Once | INTRAVENOUS | Status: AC
  Administered 2019-12-03: 168 mg via INTRAVENOUS
  Filled 2019-12-03: qty 8.4

## 2019-12-03 MED ORDER — DIPHENHYDRAMINE HCL 25 MG PO CAPS
25.0000 mg | ORAL_CAPSULE | Freq: Once | ORAL | Status: AC
  Administered 2019-12-03: 25 mg via ORAL
  Filled 2019-12-03: qty 1

## 2019-12-03 MED ORDER — PALONOSETRON HCL INJECTION 0.25 MG/5ML
0.2500 mg | Freq: Once | INTRAVENOUS | Status: AC
  Administered 2019-12-03: 0.25 mg via INTRAVENOUS
  Filled 2019-12-03: qty 5

## 2019-12-03 NOTE — Progress Notes (Signed)
Patient has been assessed, vital signs and labs have been reviewed by Dr. Katragadda. ANC, Creatinine, LFTs, and Platelets are within treatment parameters per Dr. Katragadda. The patient is good to proceed with treatment at this time.  

## 2019-12-03 NOTE — Patient Instructions (Addendum)
[facility] Discharge Instructions for Patients Receiving Chemotherapy   Beginning January 23rd 2017 lab work for the [facility] will be done in the  Main lab at [facility] on 1st floor. If you have a lab appointment with the [facility] please come in thru the  Main Entrance and check in at the main information desk   Today you received the following chemotherapy agents Enhertu. Follow-up as scheduled. Call clinic for any questions or concerns  To help prevent nausea and vomiting after your treatment, we encourage you to take your nausea medication   If you develop nausea and vomiting, or diarrhea that is not controlled by your medication, call the clinic.  The clinic phone number is [phone]. Office hours are Monday-Friday 8:30am-5:00pm.  BELOW ARE SYMPTOMS THAT SHOULD BE REPORTED IMMEDIATELY:  *FEVER GREATER THAN 101.0 F  *CHILLS WITH OR WITHOUT FEVER  NAUSEA AND VOMITING THAT IS NOT CONTROLLED WITH YOUR NAUSEA MEDICATION  *UNUSUAL SHORTNESS OF BREATH  *UNUSUAL BRUISING OR BLEEDING  TENDERNESS IN MOUTH AND THROAT WITH OR WITHOUT PRESENCE OF ULCERS  *URINARY PROBLEMS  *BOWEL PROBLEMS  UNUSUAL RASH Items with * indicate a potential emergency and should be followed up as soon as possible. If you have an emergency after office hours please contact your primary care physician or go to the nearest emergency department.  Please call the clinic during office hours if you have any questions or concerns.   You may also contact the Patient Navigator at [phone] should you have any questions or need assistance in obtaining follow up care.      Resources For Cancer Patients and their Caregivers ? American Cancer Society: Can assist with transportation, wigs, general needs, runs Look Good Feel Better.        206 212 9805 ? Cancer Care: Provides financial assistance, online support groups, medication/co-pay assistance.  1-800-813-HOPE  321-245-1973) ? [facility] Assists [place] cancer patients and their families through emotional , educational and financial support.  475-888-0057 ? [place] DSS Where to apply for food stamps, Medicaid and utility assistance. 641 219 5648 ? [facility]: Transportation to medical appointments. 503 258 2210 ? Social Security Administration: May apply for disability if have a Stage IV cancer. (513) [phone] ? [employer], Disability and Transit Services: Assists with nutrition, care and transit needs. [phone]

## 2019-12-03 NOTE — Patient Instructions (Addendum)
[facility] at [facility] Discharge Instructions  You were seen today by Dr. Katragadda. He went over your recent lab results. He will see you back in 3 weeks for labs, treatment and follow up.   Thank you for choosing  [facility] at [facility] to provide your oncology and hematology care.  To afford each patient quality time with our provider, please arrive at least 15 minutes before your scheduled appointment time.   If you have a lab appointment with the Cancer Center please come in thru the  Main Entrance and check in at the main information desk  You need to re-schedule your appointment should you arrive 10 or more minutes late.  We strive to give you quality time with our providers, and arriving late affects you and other patients whose appointments are after yours.  Also, if you no show three or more times for appointments you may be dismissed from the clinic at the providers discretion.     Again, thank you for choosing [facility].  Our hope is that these requests will decrease the amount of time that you wait before being seen by our physicians.       _____________________________________________________________  Should you have questions after your visit to [facility], please contact our office at [phone] between the hours of 8:00 a.m. and 4:30 p.m.  Voicemails left after 4:00 p.m. will not be returned until the following business day.  For prescription refill requests, have your pharmacy contact our office and allow 72 hours.    Cancer Center Support Programs:   > Cancer Support Group  2nd Tuesday of the month 1pm-2pm, Journey Room    

## 2019-12-03 NOTE — Patient Instructions (Signed)
[facility] at [facility] Discharge Instructions  Labs drawn from portacath today   Thank you for choosing [facility] at [facility] to provide your oncology and hematology care.  To afford each patient quality time with our provider, please arrive at least 15 minutes before your scheduled appointment time.   If you have a lab appointment with the Cancer Center please come in thru the Main Entrance and check in at the main information desk.  You need to re-schedule your appointment should you arrive 10 or more minutes late.  We strive to give you quality time with our providers, and arriving late affects you and other patients whose appointments are after yours.  Also, if you no show three or more times for appointments you may be dismissed from the clinic at the providers discretion.     Again, thank you for choosing [facility].  Our hope is that these requests will decrease the amount of time that you wait before being seen by our physicians.       _____________________________________________________________  Should you have questions after your visit to [facility], please contact our office at [phone] between the hours of 8:00 a.m. and 4:30 p.m.  Voicemails left after 4:00 p.m. will not be returned until the following business day.  For prescription refill requests, have your pharmacy contact our office and allow 72 hours.    Due to Covid, you will need to wear a mask upon entering the hospital. If you do not have a mask, a mask will be given to you at the Main Entrance upon arrival. For doctor visits, patients may have 1 support person with them. For treatment visits, patients can not have anyone with them due to social distancing guidelines and our immunocompromised population.     

## 2019-12-03 NOTE — Progress Notes (Signed)
Carol Duarte, [place] [address]   CLINIC:  Medical Oncology/Hematology  PCP:  [name], MD [address] [address] [phone]   REASON FOR VISIT:  Follow-up for Breast Cancer   CURRENT THERAPY: Enhertu  BRIEF ONCOLOGIC HISTORY:  Oncology History  Invasive ductal carcinoma of left breast ([place])  03/30/2017 Initial Diagnosis   Invasive ductal carcinoma of left breast ([place])   08/09/2018 - 03/20/2019 Chemotherapy   The patient had trastuzumab (HERCEPTIN) 500 mg in sodium chloride 0.9 % 250 mL chemo infusion, 525 mg, Intravenous,  Once, 10 of 10 cycles Administration: 500 mg (08/09/2018), 350 mg (09/06/2018), 350 mg (09/27/2018), 378 mg (10/18/2018), 378 mg (11/15/2018), 378 mg (12/06/2018), 378 mg (12/28/2018), 378 mg (01/18/2019), 378 mg (02/08/2019) pertuzumab (PERJETA) 840 mg in sodium chloride 0.9 % 250 mL chemo infusion, 840 mg (100 % of original dose 840 mg), Intravenous, Once, 6 of 6 cycles Dose modification: 840 mg (original dose 840 mg, Cycle 5), 420 mg (original dose 420 mg, Cycle 6) Administration: 840 mg (11/15/2018), 420 mg (12/06/2018), 420 mg (12/28/2018), 420 mg (01/18/2019), 420 mg (02/08/2019)  for chemotherapy treatment.    09/18/2018 Genetic Testing   Negative genetic testing for the breast and gyn cancer panel.  The Breast/GYN gene panel offered by [facility] includes sequencing and rearrangement analysis for the following 23 genes:  ATM, BRCA1, BRCA2, BRIP1, CDH1, CHEK2, EPCAM, MLH1, MSH2, MSH6, NBN, NF1, PALB2, PMS2, PTEN, RAD51C, RAD51D, STK11, and TP53.   The report date is 09/18/2018.   03/12/2019 - 09/09/2019 Chemotherapy   The patient had ado-trastuzumab emtansine (KADCYLA) 140 mg in sodium chloride 0.9 % 250 mL chemo infusion, 2.4 mg/kg = 140 mg (100 % of original dose 2.4 mg/kg), Intravenous, Once, 8 of 9 cycles Dose modification: 2.4 mg/kg (original dose 2.4 mg/kg, Cycle 1, Reason: Patient Age), 3 mg/kg (original dose  2.4 mg/kg, Cycle 3, Reason: Provider Judgment) Administration: 140 mg (03/12/2019), 180 mg (04/23/2019), 180 mg (06/18/2019), 180 mg (04/02/2019), 180 mg (05/28/2019), 180 mg (07/09/2019), 180 mg (07/30/2019), 180 mg (08/20/2019)  for chemotherapy treatment.    10/22/2019 -  Chemotherapy   The patient had palonosetron (ALOXI) injection 0.25 mg, 0.25 mg, Intravenous,  Once, 3 of 6 cycles Administration: 0.25 mg (10/22/2019), 0.25 mg (11/12/2019), 0.25 mg (12/03/2019) fam-trastuzumab deruxtecan-nxki (ENHERTU) 168 mg in dextrose 5 % 100 mL chemo infusion, 3.2 mg/kg = 168 mg (100 % of original dose 3.2 mg/kg), Intravenous,  Once, 3 of 6 cycles Dose modification: 3.2 mg/kg (original dose 3.2 mg/kg, Cycle 1, Reason: Provider Judgment), 3.2 mg/kg (original dose 3.2 mg/kg, Cycle 2, Reason: Patient Age), 3.2 mg/kg (original dose 3.2 mg/kg, Cycle 3, Reason: Provider Judgment) Administration: 168 mg (10/22/2019), 168 mg (11/12/2019), 168 mg (12/03/2019)  for chemotherapy treatment.       CANCER STAGING: Cancer Staging No matching staging information was found for the patient.   INTERVAL HISTORY:  Carol Duarte 86 y.o. female seen for follow-up and toxicity assessment prior to cycle 3 of treatment for her metastatic breast cancer.  She had epidural injection done on 11/19/2019 with improvement in the pain in the left leg.  Denies any signs or symptoms of PND or orthopnea.  Has minor nosebleeds when she blows her nose.  Appetite is 75%.  Energy levels are 50%.  Mild fatigue is stable.  Reports that she is still using walker.  REVIEW OF SYSTEMS:  Review of Systems  HENT:   Positive for nosebleeds.  All other systems reviewed and are negative.    PAST MEDICAL/SURGICAL HISTORY:  Past Medical History:  Diagnosis Date  . Cancer ([place])    left breast  . History of kidney stones   . Hypercholesteremia   . Hypertension    Past Surgical History:  Procedure Laterality Date  . APPENDECTOMY    . brain cyst removed  2011   . BREAST BIOPSY Left 03/28/2018   Procedure: BREAST BIOPSY;  Surgeon: [name], MD;  Location: [facility];  Service: General;  Laterality: Left;  . CATARACT EXTRACTION W/PHACO Left 11/09/2015   Procedure: CATARACT EXTRACTION PHACO AND INTRAOCULAR LENS PLACEMENT LEFT EYE cde=8.97;  Surgeon: [name], MD;  Location: [facility];  Service: Ophthalmology;  Laterality: Left;  . CATARACT EXTRACTION W/PHACO Right 02/04/2019   Procedure: CATARACT EXTRACTION PHACO AND INTRAOCULAR LENS PLACEMENT (IOC);  Surgeon: [name], MD;  Location: [facility];  Service: Ophthalmology;  Laterality: Right;  CDE: 15.19  . EYE SURGERY     KPE left  . MASTECTOMY MODIFIED RADICAL Left 05/28/2018   Procedure: LEFT MODIFIED RADICAL MASTECTOMY;  Surgeon: [name], MD;  Location: [facility];  Service: General;  Laterality: Left;  Marland Kitchen MASTECTOMY, PARTIAL Left 04/24/2017   Procedure: MASTECTOMY PARTIAL;  Surgeon: [name], MD;  Location: [facility];  Service: General;  Laterality: Left;  . ORIF FEMUR FRACTURE Left 09/03/2019   Procedure: OPEN REDUCTION INTERNAL FIXATION (ORIF) LEFT HIP FEMUR FRACTURE;  Surgeon: Paralee Cancel, MD;  Location: [facility];  Service: Orthopedics;  Laterality: Left;  . PORTACATH PLACEMENT Right 08/08/2018   Procedure: INSERTION PORT-A-CATH;  Surgeon: [name], MD;  Location: [facility];  Service: General;  Laterality: Right;  . TONSILLECTOMY    . TONSILLECTOMY AND ADENOIDECTOMY       SOCIAL HISTORY:  Social History   Socioeconomic History  . Marital status: Widowed    Spouse name: Not on file  . Number of children: Not on file  . Years of education: Not on file  . Highest education level: Not on file  Occupational History  . Not on file  Tobacco Use  . Smoking status: Never Smoker  . Smokeless tobacco: Never Used  Substance and Sexual Activity  . Alcohol use: No  . Drug use: No  . Sexual activity: Not Currently    Partners: Male  Other Topics Concern  . Not on file  Social History  Narrative  . Not on file   Social Determinants of Health   Financial Resource Strain:   . Difficulty of Paying Living Expenses:   Food Insecurity:   . Worried About Charity fundraiser in the Last Year:   . Arboriculturist in the Last Year:   Transportation Needs:   . Film/video editor (Medical):   Marland Kitchen Lack of Transportation (Non-Medical):   Physical Activity:   . Days of Exercise per Week:   . Minutes of Exercise per Session:   Stress:   . Feeling of Stress :   Social Connections:   . Frequency of Communication with Friends and Family:   . Frequency of Social Gatherings with Friends and Family:   . Attends Religious Services:   . Active Member of Clubs or Organizations:   . Attends Archivist Meetings:   Marland Kitchen Marital Status:   Intimate Partner Violence:   . Fear of Current or Ex-Partner:   . Emotionally Abused:   Marland Kitchen Physically Abused:   . Sexually Abused:     FAMILY HISTORY:  Family History  Problem Relation Age of Onset  . Heart failure Mother   . Heart failure Father   . Congestive Heart Failure Brother   . Tuberculosis Paternal Uncle   . Tuberculosis Maternal Grandmother   . Tuberculosis Maternal Grandfather   . Congestive Heart Failure Brother     CURRENT MEDICATIONS:  Outpatient Encounter Medications as of 12/03/2019  Medication Sig  . Ado-Trastuzumab Emtansine (KADCYLA IV) Inject into the vein every 21 ( twenty-one) days.  Marland Kitchen amLODipine (NORVASC) 5 MG tablet Take 5 mg by mouth daily.  . ferrous sulfate 325 (65 FE) MG tablet Take 325 mg by mouth daily with breakfast.  . labetalol (NORMODYNE) 100 MG tablet Take 1 tablet (100 mg total) by mouth 2 (two) times daily.  Marland Kitchen oxybutynin (DITROPAN) 5 MG tablet Take 5 mg by mouth daily.   . simvastatin (ZOCOR) 40 MG tablet Take 40 mg by mouth daily.  . temazepam (RESTORIL) 30 MG capsule Take 1 capsule (30 mg total) by mouth at bedtime as needed for sleep. May increase dose to 2 capsules if 1 capsule does not help.   . [DISCONTINUED] prochlorperazine (COMPAZINE) 10 MG tablet Take 1 tablet (10 mg total) by mouth every 6 (six) hours as needed (Nausea or vomiting). (Patient taking differently: Take 10 mg by mouth daily. )   No facility-administered encounter medications on file as of 12/03/2019.    ALLERGIES:  No Known Allergies   PHYSICAL EXAM:  ECOG Performance status: 1  Vitals:   12/03/19 0805  BP: 98/84  Pulse: (!) 46  Resp: 18  Temp: (!) 96.6 F (35.9 C)  SpO2: 99%   Filed Weights   12/03/19 0805  Weight: 112 lb 12.8 oz (51.2 kg)    Physical Exam Constitutional:      Appearance: Normal appearance.  HENT:     Head: Normocephalic.     Nose: Nose normal.  Eyes:     Conjunctiva/sclera: Conjunctivae normal.  Cardiovascular:     Rate and Rhythm: Normal rate and regular rhythm.     Pulses: Normal pulses.     Heart sounds: Normal heart sounds.  Pulmonary:     Effort: Pulmonary effort is normal.     Breath sounds: Normal breath sounds.  Abdominal:     General: Bowel sounds are normal.  Musculoskeletal:        General: Normal range of motion.     Cervical back: Normal range of motion.  Skin:    General: Skin is warm and dry.  Neurological:     General: No focal deficit present.     Mental Status: She is alert and oriented to person, place, and time.  Psychiatric:        Mood and Affect: Mood normal.        Behavior: Behavior normal.        Thought Content: Thought content normal.        Judgment: Judgment normal.      LABORATORY DATA:  I have reviewed the labs as listed.  CBC    Component Value Date/Time   WBC 4.2 12/03/2019 0822   RBC 3.64 (L) 12/03/2019 0822   HGB 11.3 (L) 12/03/2019 0822   HCT 36.3 12/03/2019 0822   PLT 197 12/03/2019 0822   MCV 99.7 12/03/2019 0822   MCH 31.0 12/03/2019 0822   MCHC 31.1 12/03/2019 0822   RDW 15.7 (H) 12/03/2019 0822   LYMPHSABS 1.1 12/03/2019 0822   MONOABS 0.4 12/03/2019 0822   EOSABS 0.1  12/03/2019 0822   BASOSABS 0.1  12/03/2019 0822   CMP Latest Ref Rng & Units 12/03/2019 11/12/2019 10/29/2019  Glucose 70 - 99 mg/dL 118(H) 122(H) 170(H)  BUN 8 - 23 mg/dL 27(H) 22 36(H)  Creatinine 0.44 - 1.00 mg/dL 0.92 0.95 1.39(H)  Sodium 135 - 145 mmol/L 140 139 136  Potassium 3.5 - 5.1 mmol/L 4.2 3.9 4.5  Chloride 98 - 111 mmol/L 108 107 105  CO2 22 - 32 mmol/L 24 23 21(L)  Calcium 8.9 - 10.3 mg/dL 9.0 8.7(L) 8.9  Total Protein 6.5 - 8.1 g/dL 6.1(L) 5.9(L) 6.3(L)  Total Bilirubin 0.3 - 1.2 mg/dL 0.5 0.5 0.6  Alkaline Phos 38 - 126 U/L 58 67 73  AST 15 - 41 U/L '20 22 25  ' ALT 0 - 44 U/L '18 16 14   ' I have reviewed her scans.   ASSESSMENT & PLAN:   Invasive ductal carcinoma of left breast ([place]) 1.  Metastatic HER-2 positive left breast cancer: -8 cycles of Kadcyla from 03/12/2019 through 08/20/2019 with progression. -CT CAP on 10/14/2019 showed enlarging bilateral pulmonary nodules, enlarging left infrahilar mass.  Left axillary lymph nodes are similar.  No metastatic disease in abdomen or pelvis. -CA 15-3 was normal. -2 cycles of reduced dose Enhertu on 10/22/2019 and 11/12/2019. -She has done fairly well after last treatment.  She gained back some weight. -We reviewed her CBC which is adequate for treatment.  LFTs are also normal. -She will proceed with cycle 3 today.  She will be seen back in 3 weeks for follow-up.  I plan to repeat scans after cycle 4.  2.  High risk drug monitoring: -Echocardiogram on 11/12/2019 shows EF 60 to 65%.  Prior echo on 07/02/2019 shows EF 60-65%.  3.  Left leg pain: -She had a left L5 nerve root block and transforaminal epidural on 11/19/2019. -She noticed that the pain in the left leg has gone away completely.  4.  Difficulty sleeping: -Mirtazapine was increased to 30 mg at bedtime.  She reports that it is helping.  We will send a refill.  5.  Elevated creatinine: -She had elevated creatinine after cycle 2.  This has improved to 0.96 today.  We will keep a close watch on  it.    Orders placed this encounter:  No orders of the defined types were placed in this encounter.    Derek Jack, MD  [place] [phone]

## 2019-12-03 NOTE — Progress Notes (Signed)
0857 Labs reviewed with and pt seen by Dr. Delton Coombes and pt approved for Enhertu infusion per MD                                             Carol Duarte tolerated Enhertu infusion well without complaints or incident. VSS upon discharge. Pt discharged via wheelchair in satisfactory condition

## 2019-12-03 NOTE — Assessment & Plan Note (Addendum)
1.  Metastatic HER-2 positive left breast cancer: -8 cycles of Kadcyla from 03/12/2019 through 08/20/2019 with progression. -CT CAP on 10/14/2019 showed enlarging bilateral pulmonary nodules, enlarging left infrahilar mass.  Left axillary lymph nodes are similar.  No metastatic disease in abdomen or pelvis. -CA 15-3 was normal. -2 cycles of reduced dose Enhertu on 10/22/2019 and 11/12/2019. -She has done fairly well after last treatment.  She gained back some weight. -We reviewed her CBC which is adequate for treatment.  LFTs are also normal. -She will proceed with cycle 3 today.  She will be seen back in 3 weeks for follow-up.  I plan to repeat scans after cycle 4.  2.  High risk drug monitoring: -Echocardiogram on 11/12/2019 shows EF 60 to 65%.  Prior echo on 07/02/2019 shows EF 60-65%.  3.  Left leg pain: -She had a left L5 nerve root block and transforaminal epidural on 11/19/2019. -She noticed that the pain in the left leg has gone away completely.  4.  Difficulty sleeping: -Mirtazapine was increased to 30 mg at bedtime.  She reports that it is helping.  We will send a refill.  5.  Elevated creatinine: -She had elevated creatinine after cycle 2.  This has improved to 0.96 today.  We will keep a close watch on it.

## 2019-12-04 LAB — CANCER ANTIGEN 15-3: CA 15-3: 7.9 U/mL (ref 0.0–25.0)

## 2019-12-18 NOTE — Progress Notes (Signed)

## 2019-12-24 ENCOUNTER — Inpatient Hospital Stay (HOSPITAL_COMMUNITY): Payer: Medicare Other

## 2019-12-24 ENCOUNTER — Other Ambulatory Visit: Payer: Self-pay

## 2019-12-24 ENCOUNTER — Inpatient Hospital Stay (HOSPITAL_BASED_OUTPATIENT_CLINIC_OR_DEPARTMENT_OTHER): Payer: Medicare Other | Admitting: Hematology

## 2019-12-24 VITALS — BP 134/61 | HR 47 | Temp 96.9°F | Resp 16 | Wt 109.2 lb

## 2019-12-24 VITALS — BP 128/56 | HR 52 | Temp 97.2°F | Resp 16

## 2019-12-24 DIAGNOSIS — Z836 Family history of other diseases of the respiratory system: Secondary | ICD-10-CM | POA: Diagnosis not present

## 2019-12-24 DIAGNOSIS — Z8249 Family history of ischemic heart disease and other diseases of the circulatory system: Secondary | ICD-10-CM | POA: Diagnosis not present

## 2019-12-24 DIAGNOSIS — C50912 Malignant neoplasm of unspecified site of left female breast: Secondary | ICD-10-CM

## 2019-12-24 DIAGNOSIS — M79605 Pain in left leg: Secondary | ICD-10-CM | POA: Diagnosis not present

## 2019-12-24 DIAGNOSIS — Z79899 Other long term (current) drug therapy: Secondary | ICD-10-CM | POA: Diagnosis not present

## 2019-12-24 DIAGNOSIS — Z5112 Encounter for antineoplastic immunotherapy: Secondary | ICD-10-CM | POA: Diagnosis not present

## 2019-12-24 DIAGNOSIS — G479 Sleep disorder, unspecified: Secondary | ICD-10-CM | POA: Diagnosis not present

## 2019-12-24 DIAGNOSIS — Z87442 Personal history of urinary calculi: Secondary | ICD-10-CM | POA: Diagnosis not present

## 2019-12-24 DIAGNOSIS — R918 Other nonspecific abnormal finding of lung field: Secondary | ICD-10-CM | POA: Diagnosis not present

## 2019-12-24 DIAGNOSIS — C50812 Malignant neoplasm of overlapping sites of left female breast: Secondary | ICD-10-CM | POA: Diagnosis not present

## 2019-12-24 DIAGNOSIS — R5383 Other fatigue: Secondary | ICD-10-CM | POA: Diagnosis not present

## 2019-12-24 LAB — CBC WITH DIFFERENTIAL/PLATELET
Abs Immature Granulocytes: 0.01 10*3/uL (ref 0.00–0.07)
Basophils Absolute: 0.1 10*3/uL (ref 0.0–0.1)
Basophils Relative: 1 %
Eosinophils Absolute: 0.2 10*3/uL (ref 0.0–0.5)
Eosinophils Relative: 4 %
HCT: 36.3 % (ref 36.0–46.0)
Hemoglobin: 11.5 g/dL — ABNORMAL LOW (ref 12.0–15.0)
Immature Granulocytes: 0 %
Lymphocytes Relative: 23 %
Lymphs Abs: 1 10*3/uL (ref 0.7–4.0)
MCH: 31.9 pg (ref 26.0–34.0)
MCHC: 31.7 g/dL (ref 30.0–36.0)
MCV: 100.8 fL — ABNORMAL HIGH (ref 80.0–100.0)
Monocytes Absolute: 0.4 10*3/uL (ref 0.1–1.0)
Monocytes Relative: 8 %
Neutro Abs: 2.7 10*3/uL (ref 1.7–7.7)
Neutrophils Relative %: 64 %
Platelets: 196 10*3/uL (ref 150–400)
RBC: 3.6 MIL/uL — ABNORMAL LOW (ref 3.87–5.11)
RDW: 15.2 % (ref 11.5–15.5)
WBC: 4.3 10*3/uL (ref 4.0–10.5)
nRBC: 0 % (ref 0.0–0.2)

## 2019-12-24 LAB — COMPREHENSIVE METABOLIC PANEL
ALT: 21 U/L (ref 0–44)
AST: 23 U/L (ref 15–41)
Albumin: 3.4 g/dL — ABNORMAL LOW (ref 3.5–5.0)
Alkaline Phosphatase: 61 U/L (ref 38–126)
Anion gap: 11 (ref 5–15)
BUN: 23 mg/dL (ref 8–23)
CO2: 23 mmol/L (ref 22–32)
Calcium: 8.9 mg/dL (ref 8.9–10.3)
Chloride: 106 mmol/L (ref 98–111)
Creatinine, Ser: 0.93 mg/dL (ref 0.44–1.00)
GFR calc Af Amer: >60 mL/min (ref >60)
GFR calc non Af Amer: 53 mL/min — ABNORMAL LOW (ref >60)
Glucose, Bld: 114 mg/dL — ABNORMAL HIGH (ref 70–99)
Potassium: 4.3 mmol/L (ref 3.5–5.1)
Sodium: 140 mmol/L (ref 135–145)
Total Bilirubin: 0.6 mg/dL (ref 0.3–1.2)
Total Protein: 5.8 g/dL — ABNORMAL LOW (ref 6.5–8.1)

## 2019-12-24 LAB — MAGNESIUM: Magnesium: 2 mg/dL (ref 1.7–2.4)

## 2019-12-24 MED ORDER — DIPHENHYDRAMINE HCL 25 MG PO CAPS
25.0000 mg | ORAL_CAPSULE | Freq: Once | ORAL | Status: AC
  Administered 2019-12-24: 11:00:00 25 mg via ORAL
  Filled 2019-12-24: qty 1

## 2019-12-24 MED ORDER — DEXTROSE 5 % IV SOLN: Freq: Once | INTRAVENOUS | Status: AC

## 2019-12-24 MED ORDER — SODIUM CHLORIDE 0.9 % IV SOLN
10.0000 mg | Freq: Once | INTRAVENOUS | Status: AC
  Administered 2019-12-24: 10 mg via INTRAVENOUS
  Filled 2019-12-24: qty 10

## 2019-12-24 MED ORDER — SODIUM CHLORIDE 0.9% FLUSH
10.0000 mL | INTRAVENOUS | Status: DC | PRN
  Administered 2019-12-24: 10 mL

## 2019-12-24 MED ORDER — HEPARIN SOD (PORK) LOCK FLUSH 100 UNIT/ML IV SOLN
500.0000 [IU] | Freq: Once | INTRAVENOUS | Status: AC | PRN
  Administered 2019-12-24: 13:00:00 500 [IU]

## 2019-12-24 MED ORDER — FAM-TRASTUZUMAB DERUXTECAN-NXKI CHEMO 100 MG IV SOLR
3.2000 mg/kg | Freq: Once | INTRAVENOUS | Status: AC
  Administered 2019-12-24: 168 mg via INTRAVENOUS
  Filled 2019-12-24: qty 8.4

## 2019-12-24 MED ORDER — ACETAMINOPHEN 325 MG PO TABS
650.0000 mg | ORAL_TABLET | Freq: Once | ORAL | Status: AC
  Administered 2019-12-24: 11:00:00 650 mg via ORAL
  Filled 2019-12-24: qty 2

## 2019-12-24 MED ORDER — PALONOSETRON HCL INJECTION 0.25 MG/5ML
0.2500 mg | Freq: Once | INTRAVENOUS | Status: AC
  Administered 2019-12-24: 0.25 mg via INTRAVENOUS
  Filled 2019-12-24: qty 5

## 2019-12-24 NOTE — Progress Notes (Signed)
Patient has been assessed, vital signs and labs have been reviewed by Dr. Katragadda. ANC, Creatinine, LFTs, and Platelets are within treatment parameters per Dr. Katragadda. The patient is good to proceed with treatment at this time.  

## 2019-12-24 NOTE — Patient Instructions (Signed)
[facility] Discharge Instructions for Patients Receiving Chemotherapy  Today you received the following chemotherapy agents   To help prevent nausea and vomiting after your treatment, we encourage you to take your nausea medication   If you develop nausea and vomiting that is not controlled by your nausea medication, call the clinic.   BELOW ARE SYMPTOMS THAT SHOULD BE REPORTED IMMEDIATELY:  *FEVER GREATER THAN 100.5 F  *CHILLS WITH OR WITHOUT FEVER  NAUSEA AND VOMITING THAT IS NOT CONTROLLED WITH YOUR NAUSEA MEDICATION  *UNUSUAL SHORTNESS OF BREATH  *UNUSUAL BRUISING OR BLEEDING  TENDERNESS IN MOUTH AND THROAT WITH OR WITHOUT PRESENCE OF ULCERS  *URINARY PROBLEMS  *BOWEL PROBLEMS  UNUSUAL RASH Items with * indicate a potential emergency and should be followed up as soon as possible.  Feel free to call the clinic should you have any questions or concerns. The clinic phone number is [phone].  Please show the CHEMO ALERT CARD at check-in to the Emergency Department and triage nurse.   

## 2019-12-24 NOTE — Progress Notes (Signed)
Labs reviewed with MD today. Will proceed with treatment per MD.  ? ?Treatment given per orders. Patient tolerated it well without problems. Vitals stable and discharged home from clinic via wheelchair. Follow up as scheduled. ? ? ?

## 2019-12-24 NOTE — Patient Instructions (Signed)
[place] at [facility] Discharge Instructions  You were seen today by Dr. Delton Coombes. He went over your recent lab results. Dr. Delton Coombes recommends drinking Boost or Ensure to prevent weight loss.  He will also send in Marinol to your pharmacy to help increase your appetite.  He will see you back in 3 weeks for labs, scan and follow up.   Thank you for choosing [place] at [facility] to provide your oncology and hematology care.  To afford each patient quality time with our provider, please arrive at least 15 minutes before your scheduled appointment time.   If you have a lab appointment with the [facility] please come in thru the  Main Entrance and check in at the main information desk  You need to re-schedule your appointment should you arrive 10 or more minutes late.  We strive to give you quality time with our providers, and arriving late affects you and other patients whose appointments are after yours.  Also, if you no show three or more times for appointments you may be dismissed from the clinic at the providers discretion.     Again, thank you for choosing [facility].  Our hope is that these requests will decrease the amount of time that you wait before being seen by our physicians.       _____________________________________________________________  Should you have questions after your visit to [facility], please contact our office at [phone] between the hours of 8:00 a.m. and 4:30 p.m.  Voicemails left after 4:00 p.m. will not be returned until the following business day.  For prescription refill requests, have your pharmacy contact our office and allow 72 hours.    Cancer Center Support Programs:   > Cancer Support Group  2nd Tuesday of the month 1pm-2pm, Journey Room

## 2019-12-24 NOTE — Progress Notes (Signed)
[address] [place], [place] [address]   CLINIC:  Medical Oncology/Hematology  PCP:  [name], MD [place] [address] [phone]   REASON FOR VISIT:  Follow-up for Breast Cancer   CURRENT THERAPY: Enhertu  BRIEF ONCOLOGIC HISTORY:  Oncology History  Invasive ductal carcinoma of left breast ([place])  03/30/2017 Initial Diagnosis   Invasive ductal carcinoma of left breast ([place])   08/09/2018 - 03/20/2019 Chemotherapy   The patient had trastuzumab (HERCEPTIN) 500 mg in sodium chloride 0.9 % 250 mL chemo infusion, 525 mg, Intravenous,  Once, 10 of 10 cycles Administration: 500 mg (08/09/2018), 350 mg (09/06/2018), 350 mg (09/27/2018), 378 mg (10/18/2018), 378 mg (11/15/2018), 378 mg (12/06/2018), 378 mg (12/28/2018), 378 mg (01/18/2019), 378 mg (02/08/2019) pertuzumab (PERJETA) 840 mg in sodium chloride 0.9 % 250 mL chemo infusion, 840 mg (100 % of original dose 840 mg), Intravenous, Once, 6 of 6 cycles Dose modification: 840 mg (original dose 840 mg, Cycle 5), 420 mg (original dose 420 mg, Cycle 6) Administration: 840 mg (11/15/2018), 420 mg (12/06/2018), 420 mg (12/28/2018), 420 mg (01/18/2019), 420 mg (02/08/2019)  for chemotherapy treatment.    09/18/2018 Genetic Testing   Negative genetic testing for the breast and gyn cancer panel.  The Breast/GYN gene panel offered by [facility] includes sequencing and rearrangement analysis for the following 23 genes:  ATM, BRCA1, BRCA2, BRIP1, CDH1, CHEK2, EPCAM, MLH1, MSH2, MSH6, NBN, NF1, PALB2, PMS2, PTEN, RAD51C, RAD51D, STK11, and TP53.   The report date is 09/18/2018.   03/12/2019 - 09/09/2019 Chemotherapy   The patient had ado-trastuzumab emtansine (KADCYLA) 140 mg in sodium chloride 0.9 % 250 mL chemo infusion, 2.4 mg/kg = 140 mg (100 % of original dose 2.4 mg/kg), Intravenous, Once, 8 of 9 cycles Dose modification: 2.4 mg/kg (original dose 2.4 mg/kg, Cycle 1, Reason: Patient Age), 3 mg/kg (original dose  2.4 mg/kg, Cycle 3, Reason: Provider Judgment) Administration: 140 mg (03/12/2019), 180 mg (04/23/2019), 180 mg (06/18/2019), 180 mg (04/02/2019), 180 mg (05/28/2019), 180 mg (07/09/2019), 180 mg (07/30/2019), 180 mg (08/20/2019)  for chemotherapy treatment.    10/22/2019 -  Chemotherapy   The patient had palonosetron (ALOXI) injection 0.25 mg, 0.25 mg, Intravenous,  Once, 4 of 6 cycles Administration: 0.25 mg (10/22/2019), 0.25 mg (11/12/2019), 0.25 mg (12/03/2019), 0.25 mg (12/24/2019) fam-trastuzumab deruxtecan-nxki (ENHERTU) 168 mg in dextrose 5 % 100 mL chemo infusion, 3.2 mg/kg = 168 mg (100 % of original dose 3.2 mg/kg), Intravenous,  Once, 4 of 6 cycles Dose modification: 3.2 mg/kg (original dose 3.2 mg/kg, Cycle 1, Reason: Provider Judgment), 3.2 mg/kg (original dose 3.2 mg/kg, Cycle 2, Reason: Patient Age), 3.2 mg/kg (original dose 3.2 mg/kg, Cycle 3, Reason: Provider Judgment), 3.2 mg/kg (original dose 3.2 mg/kg, Cycle 4, Reason: Provider Judgment) Administration: 168 mg (10/22/2019), 168 mg (11/12/2019), 168 mg (12/03/2019), 168 mg (12/24/2019)  for chemotherapy treatment.       CANCER STAGING: Cancer Staging No matching staging information was found for the patient.   INTERVAL HISTORY:  Ms. Scaff 86 y.o. female seen for follow-up of her metastatic breast cancer, prior to next cycle of Enhertu.  She still has some pain in the left leg and uses a walker.  Reports decreased appetite and lost 3 pounds since last treatment.  Appetite is 25%.  Energy levels are 50%.  REVIEW OF SYSTEMS:  Review of Systems  All other systems reviewed and are negative.    PAST MEDICAL/SURGICAL HISTORY:  Past Medical History:  Diagnosis Date  . Cancer ([place])    left breast  . History of kidney stones   . Hypercholesteremia   . Hypertension    Past Surgical History:  Procedure Laterality Date  . APPENDECTOMY    . brain cyst removed  2011  . BREAST BIOPSY Left 03/28/2018   Procedure: BREAST BIOPSY;  Surgeon:  [name], MD;  Location: [facility];  Service: General;  Laterality: Left;  . CATARACT EXTRACTION W/PHACO Left 11/09/2015   Procedure: CATARACT EXTRACTION PHACO AND INTRAOCULAR LENS PLACEMENT LEFT EYE cde=8.97;  Surgeon: [name], MD;  Location: [facility];  Service: Ophthalmology;  Laterality: Left;  . CATARACT EXTRACTION W/PHACO Right 02/04/2019   Procedure: CATARACT EXTRACTION PHACO AND INTRAOCULAR LENS PLACEMENT (IOC);  Surgeon: [name], MD;  Location: [facility];  Service: Ophthalmology;  Laterality: Right;  CDE: 15.19  . EYE SURGERY     KPE left  . MASTECTOMY MODIFIED RADICAL Left 05/28/2018   Procedure: LEFT MODIFIED RADICAL MASTECTOMY;  Surgeon: [name], MD;  Location: [facility];  Service: General;  Laterality: Left;  Marland Kitchen MASTECTOMY, PARTIAL Left 04/24/2017   Procedure: MASTECTOMY PARTIAL;  Surgeon: [name], MD;  Location: [facility];  Service: General;  Laterality: Left;  . ORIF FEMUR FRACTURE Left 09/03/2019   Procedure: OPEN REDUCTION INTERNAL FIXATION (ORIF) LEFT HIP FEMUR FRACTURE;  Surgeon: Paralee Cancel, MD;  Location: [facility];  Service: Orthopedics;  Laterality: Left;  . PORTACATH PLACEMENT Right 08/08/2018   Procedure: INSERTION PORT-A-CATH;  Surgeon: [name], MD;  Location: [facility];  Service: General;  Laterality: Right;  . TONSILLECTOMY    . TONSILLECTOMY AND ADENOIDECTOMY       SOCIAL HISTORY:  Social History   Socioeconomic History  . Marital status: Widowed    Spouse name: Not on file  . Number of children: Not on file  . Years of education: Not on file  . Highest education level: Not on file  Occupational History  . Not on file  Tobacco Use  . Smoking status: Never Smoker  . Smokeless tobacco: Never Used  Substance and Sexual Activity  . Alcohol use: No  . Drug use: No  . Sexual activity: Not Currently    Partners: Male  Other Topics Concern  . Not on file  Social History Narrative  . Not on file   Social Determinants of Health   Financial  Resource Strain:   . Difficulty of Paying Living Expenses:   Food Insecurity:   . Worried About Charity fundraiser in the Last Year:   . Arboriculturist in the Last Year:   Transportation Needs:   . Film/video editor (Medical):   Marland Kitchen Lack of Transportation (Non-Medical):   Physical Activity:   . Days of Exercise per Week:   . Minutes of Exercise per Session:   Stress:   . Feeling of Stress :   Social Connections:   . Frequency of Communication with Friends and Family:   . Frequency of Social Gatherings with Friends and Family:   . Attends Religious Services:   . Active Member of Clubs or Organizations:   . Attends Archivist Meetings:   Marland Kitchen Marital Status:   Intimate Partner Violence:   . Fear of Current or Ex-Partner:   . Emotionally Abused:   Marland Kitchen Physically Abused:   . Sexually Abused:     FAMILY HISTORY:  Family History  Problem Relation Age of Onset  . Heart failure Mother   . Heart  failure Father   . Congestive Heart Failure Brother   . Tuberculosis Paternal Uncle   . Tuberculosis Maternal Grandmother   . Tuberculosis Maternal Grandfather   . Congestive Heart Failure Brother     CURRENT MEDICATIONS:  Outpatient Encounter Medications as of 12/24/2019  Medication Sig  . Ado-Trastuzumab Emtansine (KADCYLA IV) Inject into the vein every 21 ( twenty-one) days.  Marland Kitchen amLODipine (NORVASC) 5 MG tablet Take 5 mg by mouth daily.  . ferrous sulfate 325 (65 FE) MG tablet Take 325 mg by mouth daily with breakfast.  . labetalol (NORMODYNE) 100 MG tablet Take 1 tablet (100 mg total) by mouth 2 (two) times daily.  Marland Kitchen oxybutynin (DITROPAN) 5 MG tablet Take 5 mg by mouth daily.   . simvastatin (ZOCOR) 40 MG tablet Take 40 mg by mouth daily.  . [DISCONTINUED] prochlorperazine (COMPAZINE) 10 MG tablet Take 1 tablet (10 mg total) by mouth every 6 (six) hours as needed (Nausea or vomiting). (Patient taking differently: Take 10 mg by mouth daily. )  . dronabinol (MARINOL) 2.5 MG  capsule Take 1 capsule (2.5 mg total) by mouth 2 (two) times daily before a meal.  . temazepam (RESTORIL) 30 MG capsule Take 1 capsule (30 mg total) by mouth at bedtime as needed for sleep. May increase dose to 2 capsules if 1 capsule does not help.  . [DISCONTINUED] temazepam (RESTORIL) 30 MG capsule Take 1 capsule (30 mg total) by mouth at bedtime as needed for sleep. May increase dose to 2 capsules if 1 capsule does not help. (Patient not taking: Reported on 12/24/2019)   No facility-administered encounter medications on file as of 12/24/2019.    ALLERGIES:  No Known Allergies   PHYSICAL EXAM:  ECOG Performance status: 1  Vitals:   12/24/19 0934  BP: 134/61  Pulse: (!) 47  Resp: 16  Temp: (!) 96.9 F (36.1 C)  SpO2: 100%   Filed Weights   12/24/19 0934  Weight: 109 lb 3.2 oz (49.5 kg)    Physical Exam Constitutional:      Appearance: Normal appearance.  HENT:     Head: Normocephalic.     Nose: Nose normal.  Eyes:     Conjunctiva/sclera: Conjunctivae normal.  Cardiovascular:     Rate and Rhythm: Normal rate and regular rhythm.     Pulses: Normal pulses.     Heart sounds: Normal heart sounds.  Pulmonary:     Effort: Pulmonary effort is normal.     Breath sounds: Normal breath sounds.  Abdominal:     General: Bowel sounds are normal.  Musculoskeletal:        General: Normal range of motion.     Cervical back: Normal range of motion.  Skin:    General: Skin is warm and dry.  Neurological:     General: No focal deficit present.     Mental Status: She is alert and oriented to person, place, and time.  Psychiatric:        Mood and Affect: Mood normal.        Behavior: Behavior normal.        Thought Content: Thought content normal.        Judgment: Judgment normal.      LABORATORY DATA:  I have reviewed the labs as listed.  CBC    Component Value Date/Time   WBC 4.3 12/24/2019 0946   RBC 3.60 (L) 12/24/2019 0946   HGB 11.5 (L) 12/24/2019 0946   HCT 36.3  12/24/2019 0946  PLT 196 12/24/2019 0946   MCV 100.8 (H) 12/24/2019 0946   MCH 31.9 12/24/2019 0946   MCHC 31.7 12/24/2019 0946   RDW 15.2 12/24/2019 0946   LYMPHSABS 1.0 12/24/2019 0946   MONOABS 0.4 12/24/2019 0946   EOSABS 0.2 12/24/2019 0946   BASOSABS 0.1 12/24/2019 0946   CMP Latest Ref Rng & Units 12/24/2019 12/03/2019 11/12/2019  Glucose 70 - 99 mg/dL 114(H) 118(H) 122(H)  BUN 8 - 23 mg/dL 23 27(H) 22  Creatinine 0.44 - 1.00 mg/dL 0.93 0.92 0.95  Sodium 135 - 145 mmol/L 140 140 139  Potassium 3.5 - 5.1 mmol/L 4.3 4.2 3.9  Chloride 98 - 111 mmol/L 106 108 107  CO2 22 - 32 mmol/L _0 Calcium 8.9 - 10.3 mg/dL 8.9 9.0 8.7(L)  Total Protein 6.5 - 8.1 g/dL 5.8(L) 6.1(L) 5.9(L)  Total Bilirubin 0.3 - 1.2 mg/dL 0.6 0.5 0.5  Alkaline Phos 38 - 126 U/L 61 58 67  AST 15 - 41 U/L _1 ALT 0 - 44 U/L _2 I have reviewed scans.   ASSESSMENT & PLAN:   Invasive ductal carcinoma of left breast ([place]) 1.  Metastatic HER-2 positive left breast cancer: -3 cycles of Enhertu from 10/22/2019 through 12/03/2019. -CT CAP on 10/14/2019 showed enlarging bilateral pulmonary nodules, enlarging left infrahilar mass.  Left axillary lymph nodes are similar.  No metastatic disease in the abdomen or pelvis.  Progression happened on Kadcyla. -I have reviewed her labs.  White count is 4.3.  ANC is normal.  Platelet count 196. -She will proceed with her next cycle today.  I plan to repeat CT CAP prior to next visit.  2.  High risk drug monitoring: -Echocardiogram on 11/12/2019 shows EF 60 to 65%.  3.  Left leg pain: -He had a left L5 nerve root block and transforaminal epidural on 11/19/2019. -She still has some pain.  She will follow up with Dr. Lucia Gaskins for another shot.  She is currently using walker.  4.  Difficulty sleeping: -Mirtazapine 30 mg at bedtime.  We will send a 90-day supply.  5.  Weight loss: -She reported 3 pound weight loss and decreased appetite. -We will start her on  Marinol 2.5 mg twice daily.    Orders placed this encounter:  Orders Placed This Encounter  Procedures  . CT Abdomen Pelvis W Contrast  . CT Chest W Contrast  . CBC with Differential/Platelet  . Comprehensive metabolic panel  . Lactate dehydrogenase  . Magnesium  . Cancer antigen 27.29     Derek Jack, MD  [place] [phone]

## 2019-12-26 DIAGNOSIS — R6889 Other general symptoms and signs: Secondary | ICD-10-CM | POA: Diagnosis not present

## 2019-12-28 MED ORDER — TEMAZEPAM 30 MG PO CAPS: 30.0000 mg | ORAL_CAPSULE | Freq: Every evening | ORAL | 1 refills | Status: DC | PRN

## 2019-12-28 MED ORDER — DRONABINOL 2.5 MG PO CAPS
2.5000 mg | ORAL_CAPSULE | Freq: Two times a day (BID) | ORAL | 1 refills | Status: DC
Start: 2019-12-28 — End: 2020-05-20

## 2019-12-28 NOTE — Assessment & Plan Note (Signed)
1.  Metastatic HER-2 positive left breast cancer: -3 cycles of Enhertu from 10/22/2019 through 12/03/2019. -CT CAP on 10/14/2019 showed enlarging bilateral pulmonary nodules, enlarging left infrahilar mass.  Left axillary lymph nodes are similar.  No metastatic disease in the abdomen or pelvis.  Progression happened on Kadcyla. -I have reviewed her labs.  White count is 4.3.  ANC is normal.  Platelet count 196. -She will proceed with her next cycle today.  I plan to repeat CT CAP prior to next visit.  2.  High risk drug monitoring: -Echocardiogram on 11/12/2019 shows EF 60 to 65%.  3.  Left leg pain: -He had a left L5 nerve root block and transforaminal epidural on 11/19/2019. -She still has some pain.  She will follow up with Dr. Lucia Gaskins for another shot.  She is currently using walker.  4.  Difficulty sleeping: -Mirtazapine 30 mg at bedtime.  We will send a 90-day supply.  5.  Weight loss: -She reported 3 pound weight loss and decreased appetite. -We will start her on Marinol 2.5 mg twice daily.

## 2020-01-01 ENCOUNTER — Other Ambulatory Visit: Payer: Self-pay | Admitting: Orthopedic Surgery

## 2020-01-01 DIAGNOSIS — M545 Low back pain, unspecified: Secondary | ICD-10-CM

## 2020-01-07 ENCOUNTER — Other Ambulatory Visit: Payer: Self-pay

## 2020-01-07 ENCOUNTER — Other Ambulatory Visit: Payer: Medicare Other

## 2020-01-07 ENCOUNTER — Other Ambulatory Visit: Payer: Self-pay | Admitting: Orthopedic Surgery

## 2020-01-07 ENCOUNTER — Ambulatory Visit
Admission: RE | Admit: 2020-01-07 | Discharge: 2020-01-07 | Disposition: A | Discharge status: Home / Self Care | Payer: Medicare Other | Source: Ambulatory Visit | Attending: Orthopedic Surgery | Admitting: Orthopedic Surgery

## 2020-01-07 DIAGNOSIS — M545 Low back pain, unspecified: Secondary | ICD-10-CM

## 2020-01-07 DIAGNOSIS — G8929 Other chronic pain: Secondary | ICD-10-CM

## 2020-01-07 MED ORDER — METHYLPREDNISOLONE ACETATE 40 MG/ML INJ SUSP (RADIOLOG
120.0000 mg | Freq: Once | INTRAMUSCULAR | Status: AC
  Administered 2020-01-07: 120 mg via EPIDURAL

## 2020-01-07 MED ORDER — IOPAMIDOL (ISOVUE-M 200) INJECTION 41%
1.0000 mL | Freq: Once | INTRAMUSCULAR | Status: AC
  Administered 2020-01-07: 1 mL via EPIDURAL

## 2020-01-07 NOTE — Discharge Instructions (Signed)

## 2020-01-09 ENCOUNTER — Other Ambulatory Visit: Payer: Self-pay

## 2020-01-09 ENCOUNTER — Ambulatory Visit (HOSPITAL_COMMUNITY)
Admission: RE | Admit: 2020-01-09 | Discharge: 2020-01-09 | Disposition: A | Discharge status: Home / Self Care | Payer: Medicare Other | Source: Ambulatory Visit | Attending: Hematology | Admitting: Hematology

## 2020-01-09 DIAGNOSIS — C50912 Malignant neoplasm of unspecified site of left female breast: Secondary | ICD-10-CM | POA: Diagnosis not present

## 2020-01-09 DIAGNOSIS — C7800 Secondary malignant neoplasm of unspecified lung: Secondary | ICD-10-CM | POA: Diagnosis not present

## 2020-01-09 DIAGNOSIS — K5900 Constipation, unspecified: Secondary | ICD-10-CM | POA: Diagnosis not present

## 2020-01-09 MED ORDER — IOHEXOL 300 MG/ML  SOLN
75.0000 mL | Freq: Once | INTRAMUSCULAR | Status: AC | PRN
  Administered 2020-01-09: 75 mL via INTRAVENOUS

## 2020-01-13 NOTE — Progress Notes (Signed)
Carol Duarte, [place] [address]   CLINIC:  Medical Oncology/Hematology  PCP:  [name], MD [place] / [place] [address] [phone]   REASON FOR VISIT:  Follow-up for invasive ductal carcinoma of the left breast  PRIOR THERAPY:  10 cycles of trastuzumab (Herceptin), 6 cycles of pertuzumab (Perjeta), 8 cycles of emtansine (Kadcyla)  NGS Results: Not done  CURRENT THERAPY: Continue with planned chemotherapy treatment palonosetron (Aloxi), Enhertu  BRIEF ONCOLOGIC HISTORY:  Oncology History  Invasive ductal carcinoma of left breast (Carol Duarte)  03/30/2017 Initial Diagnosis   Invasive ductal carcinoma of left breast (Carol Duarte)   08/09/2018 - 03/20/2019 Chemotherapy   The patient had trastuzumab (HERCEPTIN) 500 mg in sodium chloride 0.9 % 250 mL chemo infusion, 525 mg, Intravenous,  Once, 10 of 10 cycles Administration: 500 mg (08/09/2018), 350 mg (09/06/2018), 350 mg (09/27/2018), 378 mg (10/18/2018), 378 mg (11/15/2018), 378 mg (12/06/2018), 378 mg (12/28/2018), 378 mg (01/18/2019), 378 mg (02/08/2019) pertuzumab (PERJETA) 840 mg in sodium chloride 0.9 % 250 mL chemo infusion, 840 mg (100 % of original dose 840 mg), Intravenous, Once, 6 of 6 cycles Dose modification: 840 mg (original dose 840 mg, Cycle 5), 420 mg (original dose 420 mg, Cycle 6) Administration: 840 mg (11/15/2018), 420 mg (12/06/2018), 420 mg (12/28/2018), 420 mg (01/18/2019), 420 mg (02/08/2019)  for chemotherapy treatment.    09/18/2018 Genetic Testing   Negative genetic testing for the breast and gyn cancer panel.  The Breast/GYN gene panel offered by [facility] includes sequencing and rearrangement analysis for the following 23 genes:  ATM, BRCA1, BRCA2, BRIP1, CDH1, CHEK2, EPCAM, MLH1, MSH2, MSH6, NBN, NF1, PALB2, PMS2, PTEN, RAD51C, RAD51D, STK11, and TP53.   The report date is 09/18/2018.   03/12/2019 - 09/09/2019 Chemotherapy   The patient had ado-trastuzumab emtansine (KADCYLA) 140 mg  in sodium chloride 0.9 % 250 mL chemo infusion, 2.4 mg/kg = 140 mg (100 % of original dose 2.4 mg/kg), Intravenous, Once, 8 of 9 cycles Dose modification: 2.4 mg/kg (original dose 2.4 mg/kg, Cycle 1, Reason: Patient Age), 3 mg/kg (original dose 2.4 mg/kg, Cycle 3, Reason: Provider Judgment) Administration: 140 mg (03/12/2019), 180 mg (04/23/2019), 180 mg (06/18/2019), 180 mg (04/02/2019), 180 mg (05/28/2019), 180 mg (07/09/2019), 180 mg (07/30/2019), 180 mg (08/20/2019)  for chemotherapy treatment.    10/22/2019 -  Chemotherapy   The patient had palonosetron (ALOXI) injection 0.25 mg, 0.25 mg, Intravenous,  Once, 5 of 6 cycles Administration: 0.25 mg (10/22/2019), 0.25 mg (11/12/2019), 0.25 mg (12/03/2019), 0.25 mg (12/24/2019) fam-trastuzumab deruxtecan-nxki (ENHERTU) 168 mg in dextrose 5 % 100 mL chemo infusion, 3.2 mg/kg = 168 mg (100 % of original dose 3.2 mg/kg), Intravenous,  Once, 5 of 6 cycles Dose modification: 3.2 mg/kg (original dose 3.2 mg/kg, Cycle 1, Reason: Provider Judgment), 3.2 mg/kg (original dose 3.2 mg/kg, Cycle 2, Reason: Patient Age), 3.2 mg/kg (original dose 3.2 mg/kg, Cycle 5, Reason: Patient Age), 3.2 mg/kg (original dose 3.2 mg/kg, Cycle 3, Reason: Provider Judgment), 3.2 mg/kg (original dose 3.2 mg/kg, Cycle 4, Reason: Provider Judgment) Administration: 168 mg (10/22/2019), 168 mg (11/12/2019), 168 mg (12/03/2019), 168 mg (12/24/2019)  for chemotherapy treatment.      CANCER STAGING: Cancer Staging No matching staging information was found for the patient.  INTERVAL HISTORY:  Carol Duarte 86 y.o. female returns for routine follow-up and consideration for next cycle of chemotherapy. Venus was last seen on 12/24/2019.  She reportedly had 1 episode of watery bowel movement yesterday.  She underwent a Chest, Abdomen, and Pelvis CT on 01/09/2020 revealing mild positive response to therapy. Supraclavicular and mediastinal lymphadenopathy is mildly decreased. Widespread pulmonary metastases,  slightly decreased in size. No evidence of metastatic disease in the abdomen or pelvis. Chronic findings include: Aortic Atherosclerosis (ICD10-I70.0). Moderate hiatal hernia. Mild sigmoid diverticulosis.  Due for cycle #5 of Enhertu today.   Overall, she tells me she has been feeling pretty well. She has had a good appetite.   Overall, she feels ready for next cycle of chemo today.    REVIEW OF SYSTEMS:  Review of Systems  Constitutional: Positive for unexpected weight change (down 1 lb). Negative for appetite change, chills, diaphoresis, fatigue and fever.       Ambulating with a walker at home  HENT:   Negative for mouth sores, sore throat and trouble swallowing.   Eyes: Negative for eye problems.  Respiratory: Negative for cough, shortness of breath and wheezing.   Cardiovascular: Negative for chest pain, leg swelling and palpitations.  Gastrointestinal: Positive for diarrhea (yesterday). Negative for abdominal pain, constipation, nausea and vomiting.  Genitourinary: Negative for bladder incontinence, dysuria and frequency.   Musculoskeletal: Negative for arthralgias, back pain and myalgias.  Skin: Negative for rash.  Neurological: Negative for dizziness, extremity weakness, headaches and numbness.  Hematological: Does not bruise/bleed easily.  Psychiatric/Behavioral: Negative for depression and sleep disturbance. The patient is not nervous/anxious.     PAST MEDICAL/SURGICAL HISTORY:  Past Medical History:  Diagnosis Date   Cancer [facility])    left breast   History of kidney stones    Hypercholesteremia    Hypertension    Past Surgical History:  Procedure Laterality Date   APPENDECTOMY     brain cyst removed  2011   BREAST BIOPSY Left 03/28/2018   Procedure: BREAST BIOPSY;  Surgeon: [name], MD;  Location: [facility];  Service: General;  Laterality: Left;   CATARACT EXTRACTION W/PHACO Left 11/09/2015   Procedure: CATARACT EXTRACTION PHACO AND INTRAOCULAR LENS  PLACEMENT LEFT EYE cde=8.97;  Surgeon: [name], MD;  Location: [facility];  Service: Ophthalmology;  Laterality: Left;   CATARACT EXTRACTION W/PHACO Right 02/04/2019   Procedure: CATARACT EXTRACTION PHACO AND INTRAOCULAR LENS PLACEMENT (IOC);  Surgeon: [name], MD;  Location: [facility];  Service: Ophthalmology;  Laterality: Right;  CDE: 15.19   EYE SURGERY     KPE left   MASTECTOMY MODIFIED RADICAL Left 05/28/2018   Procedure: LEFT MODIFIED RADICAL MASTECTOMY;  Surgeon: [name], MD;  Location: [facility];  Service: General;  Laterality: Left;   MASTECTOMY, PARTIAL Left 04/24/2017   Procedure: MASTECTOMY PARTIAL;  Surgeon: [name], MD;  Location: [facility];  Service: General;  Laterality: Left;   ORIF FEMUR FRACTURE Left 09/03/2019   Procedure: OPEN REDUCTION INTERNAL FIXATION (ORIF) LEFT HIP FEMUR FRACTURE;  Surgeon: Paralee Cancel, MD;  Location: [facility];  Service: Orthopedics;  Laterality: Left;   PORTACATH PLACEMENT Right 08/08/2018   Procedure: INSERTION PORT-A-CATH;  Surgeon: [name], MD;  Location: [facility];  Service: General;  Laterality: Right;   TONSILLECTOMY     TONSILLECTOMY AND ADENOIDECTOMY      SOCIAL HISTORY:  Social History   Socioeconomic History   Marital status: Widowed    Spouse name: Not on file   Number of children: Not on file   Years of education: Not on file   Highest education level: Not on file  Occupational History   Not on file  Tobacco Use   Smoking status: Never Smoker  Smokeless tobacco: Never Used  Substance and Sexual Activity   Alcohol use: No   Drug use: No   Sexual activity: Not Currently    Partners: Male  Other Topics Concern   Not on file  Social History Narrative   Not on file   Social Determinants of Health   Financial Resource Strain:    Difficulty of Paying Living Expenses:   Food Insecurity:    Worried About Charity fundraiser in the Last Year:    Arboriculturist in the Last Year:     Transportation Needs:    Film/video editor (Medical):    Lack of Transportation (Non-Medical):   Physical Activity:    Days of Exercise per Week:    Minutes of Exercise per Session:   Stress:    Feeling of Stress :   Social Connections:    Frequency of Communication with Friends and Family:    Frequency of Social Gatherings with Friends and Family:    Attends Religious Services:    Active Member of Clubs or Organizations:    Attends Music therapist:    Marital Status:   Intimate Partner Violence:    Fear of Current or Ex-Partner:    Emotionally Abused:    Physically Abused:    Sexually Abused:     FAMILY HISTORY:  Family History  Problem Relation Age of Onset   Heart failure Mother    Heart failure Father    Congestive Heart Failure Brother    Tuberculosis Paternal Uncle    Tuberculosis Maternal Grandmother    Tuberculosis Maternal Grandfather    Congestive Heart Failure Brother     CURRENT MEDICATIONS:  Current Outpatient Medications  Medication Sig Dispense Refill   Ado-Trastuzumab Emtansine (KADCYLA IV) Inject into the vein every 21 ( twenty-one) days.     amLODipine (NORVASC) 5 MG tablet Take 5 mg by mouth daily.     dronabinol (MARINOL) 2.5 MG capsule Take 1 capsule (2.5 mg total) by mouth 2 (two) times daily before a meal. 60 capsule 1   ferrous sulfate 325 (65 FE) MG tablet Take 325 mg by mouth daily with breakfast.     labetalol (NORMODYNE) 100 MG tablet Take 1 tablet (100 mg total) by mouth 2 (two) times daily. 60 tablet 12   oxybutynin (DITROPAN) 5 MG tablet Take 5 mg by mouth daily.      PREVIDENT 5000 BOOSTER PLUS 1.1 % PSTE APPLY WITH TOOTHBRUSH AT BEDTIME AS DIRECTED.     simvastatin (ZOCOR) 40 MG tablet Take 40 mg by mouth daily.     temazepam (RESTORIL) 30 MG capsule Take 1 capsule (30 mg total) by mouth at bedtime as needed for sleep. May increase dose to 2 capsules if 1 capsule does not help. 90 capsule  1   No current facility-administered medications for this visit.   Facility-Administered Medications Ordered in Other Visits  Medication Dose Route Frequency Provider Last Rate Last Admin   acetaminophen (TYLENOL) tablet 650 mg  650 mg Oral Once Derek Jack, MD       dexamethasone (DECADRON) 10 mg in sodium chloride 0.9 % 50 mL IVPB  10 mg Intravenous Once Derek Jack, MD       dextrose 5 % solution   Intravenous Once Derek Jack, MD       diphenhydrAMINE (BENADRYL) capsule 25 mg  25 mg Oral Once Derek Jack, MD       fam-trastuzumab deruxtecan-nxki [facility]) 168 mg in dextrose  5 % 100 mL chemo infusion  3.2 mg/kg (Order-Specific) Intravenous Once Derek Jack, MD       heparin lock flush 100 unit/mL  500 Units Intracatheter Once PRN Derek Jack, MD       palonosetron (ALOXI) injection 0.25 mg  0.25 mg Intravenous Once Derek Jack, MD       sodium chloride flush (NS) 0.9 % injection 10 mL  10 mL Intracatheter PRN Derek Jack, MD        ALLERGIES:  No Known Allergies  PHYSICAL EXAM:  Performance status (ECOG): 2 - Symptomatic, <50% confined to bed  Vitals:   01/14/20 1306  BP: (!) 137/54  Pulse: (!) 50  Resp: 16  Temp: 97.8 F (36.6 C)  SpO2: 100%   Wt Readings from Last 3 Encounters:  01/14/20 108 lb 11.2 oz (49.3 kg)  12/24/19 109 lb 3.2 oz (49.5 kg)  12/03/19 112 lb 12.8 oz (51.2 kg)   Physical Exam Constitutional:      General: She is not in acute distress.    Appearance: Normal appearance.  HENT:     Nose: No congestion.     Mouth/Throat:     Mouth: Mucous membranes are moist.  Eyes:     Extraocular Movements: Extraocular movements intact.     Pupils: Pupils are equal, round, and reactive to light.  Cardiovascular:     Rate and Rhythm: Normal rate and regular rhythm.     Pulses: Normal pulses.     Heart sounds: Normal heart sounds. No murmur. No friction rub. No gallop.   Pulmonary:       Effort: Pulmonary effort is normal.     Breath sounds: No wheezing, rhonchi or rales.  Abdominal:     General: Bowel sounds are normal.     Tenderness: There is no abdominal tenderness.  Musculoskeletal:        General: No tenderness.     Right lower leg: No edema.     Left lower leg: No edema.  Neurological:     Mental Status: She is alert and oriented to person, place, and time.     Sensory: No sensory deficit.     Motor: No weakness.  Psychiatric:        Mood and Affect: Mood normal.        Behavior: Behavior normal.        Thought Content: Thought content normal.        Judgment: Judgment normal.     LABORATORY DATA:  I have reviewed the labs as listed.  CBC Latest Ref Rng & Units 01/14/2020 12/24/2019 12/03/2019  WBC 4.0 - 10.5 K/uL 4.7 4.3 4.2  Hemoglobin 12.0 - 15.0 g/dL 11.4(L) 11.5(L) 11.3(L)  Hematocrit 36.0 - 46.0 % 36.1 36.3 36.3  Platelets 150 - 400 K/uL 180 196 197   CMP Latest Ref Rng & Units 01/14/2020 12/24/2019 12/03/2019  Glucose 70 - 99 mg/dL 149(H) 114(H) 118(H)  BUN 8 - 23 mg/dL 32(H) 23 27(H)  Creatinine 0.44 - 1.00 mg/dL 0.90 0.93 0.92  Sodium 135 - 145 mmol/L 140 140 140  Potassium 3.5 - 5.1 mmol/L 4.1 4.3 4.2  Chloride 98 - 111 mmol/L 105 106 108  CO2 22 - 32 mmol/L _0 Calcium 8.9 - 10.3 mg/dL 9.1 8.9 9.0  Total Protein 6.5 - 8.1 g/dL 5.8(L) 5.8(L) 6.1(L)  Total Bilirubin 0.3 - 1.2 mg/dL 0.6 0.6 0.5  Alkaline Phos 38 - 126 U/L 59 61 58  AST 15 -  41 U/L _0 ALT 0 - 44 U/L _1 DIAGNOSTIC IMAGING:  I have independently reviewed the scans and discussed with the patient.  01/09/2020 CT Chest, Abdomen, and Pelvis w/ contrast 1. Mild positive response to therapy. Supraclavicular and mediastinal lymphadenopathy is mildly decreased. Widespread pulmonary metastases, slightly decreased in size. 2. No evidence of metastatic disease in the abdomen or pelvis. 3. Chronic findings include: Aortic Atherosclerosis (ICD10-I70.0). Moderate  hiatal hernia. Mild sigmoid diverticulosis.  ASSESSMENT & PLAN:  Invasive ductal carcinoma of left breast ([place]) 1.  Metastatic HER-2 positive left breast cancer: -4 cycles of Enhertu from 10/22/2019 through 12/24/2019. -We reviewed CT CAP from 01/10/2020 which showed response to therapy with decreasing lung nodules as well as supraclavicular and mediastinal adenopathy with no new evidence of metastatic disease. -We reviewed her labs today.  LFTs and CBC are within normal limits. -She will proceed with her treatment today.  We will reevaluate her in 3 weeks.  2.  High risk drug monitoring: -Echocardiogram on 11/12/2019 shows EF 60-65%.  3.  Left leg pain: -She reportedly had another injection which improved her left leg pain.  She does not have any pain at this time. -She has been able to walk even without a walker since the injection.  I have instructed her to use a cane.  4.  Difficulty sleeping: -Mirtazapine 30 mg is helping.  5.  Weight loss: -Lost about 1 pound since last visit. -We started her on Marinol 2.5 mg twice daily at last visit 3 weeks ago.  She reports that its been helping.  If she continues to lose weight, we will increase the dose to 5 mg twice daily.    Orders placed this encounter:  Orders Placed This Encounter  Procedures   CBC with Differential   Comprehensive metabolic panel   Cancer antigen 15-3    Derek Jack, MD, 01/14/20 1:40 PM  [place] [phone]   I, Jacqualyn Posey, am acting as a scribe for Dr. Sanda Linger.  I, Derek Jack MD, have reviewed the above documentation for accuracy and completeness, and I agree with the above.

## 2020-01-14 ENCOUNTER — Other Ambulatory Visit: Payer: Self-pay

## 2020-01-14 ENCOUNTER — Inpatient Hospital Stay (HOSPITAL_BASED_OUTPATIENT_CLINIC_OR_DEPARTMENT_OTHER): Payer: Medicare Other | Admitting: Hematology

## 2020-01-14 ENCOUNTER — Inpatient Hospital Stay (HOSPITAL_COMMUNITY): Payer: Medicare Other | Attending: Hematology

## 2020-01-14 ENCOUNTER — Inpatient Hospital Stay (HOSPITAL_COMMUNITY): Payer: Medicare Other

## 2020-01-14 ENCOUNTER — Encounter (HOSPITAL_COMMUNITY): Payer: Self-pay | Admitting: Hematology

## 2020-01-14 VITALS — BP 128/57 | HR 46 | Temp 97.3°F | Resp 16

## 2020-01-14 VITALS — BP 137/54 | HR 50 | Temp 97.8°F | Resp 16 | Wt 108.7 lb

## 2020-01-14 DIAGNOSIS — G479 Sleep disorder, unspecified: Secondary | ICD-10-CM | POA: Diagnosis not present

## 2020-01-14 DIAGNOSIS — R197 Diarrhea, unspecified: Secondary | ICD-10-CM | POA: Diagnosis not present

## 2020-01-14 DIAGNOSIS — R590 Localized enlarged lymph nodes: Secondary | ICD-10-CM | POA: Insufficient documentation

## 2020-01-14 DIAGNOSIS — M79605 Pain in left leg: Secondary | ICD-10-CM | POA: Insufficient documentation

## 2020-01-14 DIAGNOSIS — Z87442 Personal history of urinary calculi: Secondary | ICD-10-CM | POA: Insufficient documentation

## 2020-01-14 DIAGNOSIS — R634 Abnormal weight loss: Secondary | ICD-10-CM | POA: Insufficient documentation

## 2020-01-14 DIAGNOSIS — Z8249 Family history of ischemic heart disease and other diseases of the circulatory system: Secondary | ICD-10-CM | POA: Insufficient documentation

## 2020-01-14 DIAGNOSIS — C7800 Secondary malignant neoplasm of unspecified lung: Secondary | ICD-10-CM | POA: Diagnosis not present

## 2020-01-14 DIAGNOSIS — I10 Essential (primary) hypertension: Secondary | ICD-10-CM | POA: Insufficient documentation

## 2020-01-14 DIAGNOSIS — I700 Atherosclerosis of aorta: Secondary | ICD-10-CM | POA: Insufficient documentation

## 2020-01-14 DIAGNOSIS — Z836 Family history of other diseases of the respiratory system: Secondary | ICD-10-CM | POA: Insufficient documentation

## 2020-01-14 DIAGNOSIS — Z79899 Other long term (current) drug therapy: Secondary | ICD-10-CM | POA: Insufficient documentation

## 2020-01-14 DIAGNOSIS — K5730 Diverticulosis of large intestine without perforation or abscess without bleeding: Secondary | ICD-10-CM | POA: Diagnosis not present

## 2020-01-14 DIAGNOSIS — K449 Diaphragmatic hernia without obstruction or gangrene: Secondary | ICD-10-CM | POA: Diagnosis not present

## 2020-01-14 DIAGNOSIS — C50912 Malignant neoplasm of unspecified site of left female breast: Secondary | ICD-10-CM

## 2020-01-14 DIAGNOSIS — Z5112 Encounter for antineoplastic immunotherapy: Secondary | ICD-10-CM | POA: Diagnosis not present

## 2020-01-14 DIAGNOSIS — C50812 Malignant neoplasm of overlapping sites of left female breast: Secondary | ICD-10-CM | POA: Diagnosis present

## 2020-01-14 LAB — CBC WITH DIFFERENTIAL/PLATELET
Abs Immature Granulocytes: 0.03 10*3/uL (ref 0.00–0.07)
Basophils Absolute: 0 10*3/uL (ref 0.0–0.1)
Basophils Relative: 1 %
Eosinophils Absolute: 0 10*3/uL (ref 0.0–0.5)
Eosinophils Relative: 1 %
HCT: 36.1 % (ref 36.0–46.0)
Hemoglobin: 11.4 g/dL — ABNORMAL LOW (ref 12.0–15.0)
Immature Granulocytes: 1 %
Lymphocytes Relative: 19 %
Lymphs Abs: 0.9 10*3/uL (ref 0.7–4.0)
MCH: 31.8 pg (ref 26.0–34.0)
MCHC: 31.6 g/dL (ref 30.0–36.0)
MCV: 100.6 fL — ABNORMAL HIGH (ref 80.0–100.0)
Monocytes Absolute: 0.4 10*3/uL (ref 0.1–1.0)
Monocytes Relative: 9 %
Neutro Abs: 3.3 10*3/uL (ref 1.7–7.7)
Neutrophils Relative %: 69 %
Platelets: 180 10*3/uL (ref 150–400)
RBC: 3.59 MIL/uL — ABNORMAL LOW (ref 3.87–5.11)
RDW: 15.4 % (ref 11.5–15.5)
WBC: 4.7 10*3/uL (ref 4.0–10.5)
nRBC: 0 % (ref 0.0–0.2)

## 2020-01-14 LAB — LACTATE DEHYDROGENASE: LDH: 143 U/L (ref 98–192)

## 2020-01-14 LAB — COMPREHENSIVE METABOLIC PANEL
ALT: 23 U/L (ref 0–44)
AST: 20 U/L (ref 15–41)
Albumin: 3.3 g/dL — ABNORMAL LOW (ref 3.5–5.0)
Alkaline Phosphatase: 59 U/L (ref 38–126)
Anion gap: 10 (ref 5–15)
BUN: 32 mg/dL — ABNORMAL HIGH (ref 8–23)
CO2: 25 mmol/L (ref 22–32)
Calcium: 9.1 mg/dL (ref 8.9–10.3)
Chloride: 105 mmol/L (ref 98–111)
Creatinine, Ser: 0.9 mg/dL (ref 0.44–1.00)
GFR calc Af Amer: >60 mL/min (ref >60)
GFR calc non Af Amer: 56 mL/min — ABNORMAL LOW (ref >60)
Glucose, Bld: 149 mg/dL — ABNORMAL HIGH (ref 70–99)
Potassium: 4.1 mmol/L (ref 3.5–5.1)
Sodium: 140 mmol/L (ref 135–145)
Total Bilirubin: 0.6 mg/dL (ref 0.3–1.2)
Total Protein: 5.8 g/dL — ABNORMAL LOW (ref 6.5–8.1)

## 2020-01-14 LAB — MAGNESIUM: Magnesium: 2.2 mg/dL (ref 1.7–2.4)

## 2020-01-14 MED ORDER — HEPARIN SOD (PORK) LOCK FLUSH 100 UNIT/ML IV SOLN: 500.0000 [IU] | Freq: Once | INTRAVENOUS | Status: DC | PRN

## 2020-01-14 MED ORDER — PALONOSETRON HCL INJECTION 0.25 MG/5ML
0.2500 mg | Freq: Once | INTRAVENOUS | Status: AC
  Administered 2020-01-14: 0.25 mg via INTRAVENOUS
  Filled 2020-01-14: qty 5

## 2020-01-14 MED ORDER — ACETAMINOPHEN 325 MG PO TABS
650.0000 mg | ORAL_TABLET | Freq: Once | ORAL | Status: AC
  Administered 2020-01-14: 650 mg via ORAL
  Filled 2020-01-14: qty 2

## 2020-01-14 MED ORDER — FAM-TRASTUZUMAB DERUXTECAN-NXKI CHEMO 100 MG IV SOLR
3.2000 mg/kg | Freq: Once | INTRAVENOUS | Status: AC
  Administered 2020-01-14: 168 mg via INTRAVENOUS
  Filled 2020-01-14: qty 8.4

## 2020-01-14 MED ORDER — SODIUM CHLORIDE 0.9 % IV SOLN
10.0000 mg | Freq: Once | INTRAVENOUS | Status: AC
  Administered 2020-01-14: 10 mg via INTRAVENOUS
  Filled 2020-01-14: qty 1

## 2020-01-14 MED ORDER — DIPHENHYDRAMINE HCL 25 MG PO CAPS
25.0000 mg | ORAL_CAPSULE | Freq: Once | ORAL | Status: AC
  Administered 2020-01-14: 25 mg via ORAL
  Filled 2020-01-14: qty 1

## 2020-01-14 MED ORDER — DEXTROSE 5 % IV SOLN: Freq: Once | INTRAVENOUS | Status: AC

## 2020-01-14 MED ORDER — SODIUM CHLORIDE 0.9% FLUSH: 10.0000 mL | INTRAVENOUS | Status: DC | PRN

## 2020-01-14 NOTE — Assessment & Plan Note (Signed)
1.  Metastatic HER-2 positive left breast cancer: -4 cycles of Enhertu from 10/22/2019 through 12/24/2019. -We reviewed CT CAP from 01/10/2020 which showed response to therapy with decreasing lung nodules as well as supraclavicular and mediastinal adenopathy with no new evidence of metastatic disease. -We reviewed her labs today.  LFTs and CBC are within normal limits. -She will proceed with her treatment today.  We will reevaluate her in 3 weeks.  2.  High risk drug monitoring: -Echocardiogram on 11/12/2019 shows EF 60-65%.  3.  Left leg pain: -She reportedly had another injection which improved her left leg pain.  She does not have any pain at this time. -She has been able to walk even without a walker since the injection.  I have instructed her to use a cane.  4.  Difficulty sleeping: -Mirtazapine 30 mg is helping.  5.  Weight loss: -Lost about 1 pound since last visit. -We started her on Marinol 2.5 mg twice daily at last visit 3 weeks ago.  She reports that its been helping.  If she continues to lose weight, we will increase the dose to 5 mg twice daily.

## 2020-01-14 NOTE — Patient Instructions (Signed)
[place] at [facility] Discharge Instructions  You were seen today by Dr. Delton Coombes. He went over your recent results. He will see you back in 3 weeks for treatment, labs, and follow up.   Thank you for choosing [place] at [facility] to provide your oncology and hematology care.  To afford each patient quality time with our provider, please arrive at least 15 minutes before your scheduled appointment time.   If you have a lab appointment with the [place] please come in thru the  Main Entrance and check in at the main information desk  You need to re-schedule your appointment should you arrive 10 or more minutes late.  We strive to give you quality time with our providers, and arriving late affects you and other patients whose appointments are after yours.  Also, if you no show three or more times for appointments you may be dismissed from the clinic at the providers discretion.     Again, thank you for choosing [facility].  Our hope is that these requests will decrease the amount of time that you wait before being seen by our physicians.       _____________________________________________________________  Should you have questions after your visit to [facility], please contact our office at [phone] between the hours of 8:00 a.m. and 4:30 p.m.  Voicemails left after 4:00 p.m. will not be returned until the following business day.  For prescription refill requests, have your pharmacy contact our office and allow 72 hours.    Cancer Center Support Programs:   > Cancer Support Group  2nd Tuesday of the month 1pm-2pm, Journey Room

## 2020-01-14 NOTE — Progress Notes (Signed)
Patient presents today for treatment and follow up visit with Dr. Delton Coombes. MAR reviewed. Patient has no complaints of any changes since her last visit. Labs within parameters for treatment.   Message received from [facility] LPN/ Dr. Delton Coombes. Proceed with treatment.   Treatment given today per MD orders. Tolerated infusion without adverse affects. Vital signs stable. No complaints at this time. Discharged from clinic via wheel chair. F/U with [facility] as scheduled.

## 2020-01-14 NOTE — Progress Notes (Signed)

## 2020-01-14 NOTE — Patient Instructions (Signed)
[facility] at [facility] Discharge Instructions  Labs drawn from portacath today   Thank you for choosing [facility] at [facility] to provide your oncology and hematology care.  To afford each patient quality time with our provider, please arrive at least 15 minutes before your scheduled appointment time.   If you have a lab appointment with the Cancer Center please come in thru the Main Entrance and check in at the main information desk.  You need to re-schedule your appointment should you arrive 10 or more minutes late.  We strive to give you quality time with our providers, and arriving late affects you and other patients whose appointments are after yours.  Also, if you no show three or more times for appointments you may be dismissed from the clinic at the providers discretion.     Again, thank you for choosing [facility].  Our hope is that these requests will decrease the amount of time that you wait before being seen by our physicians.       _____________________________________________________________  Should you have questions after your visit to  Cancer Center, please contact our office at [phone] between the hours of 8:00 a.m. and 4:30 p.m.  Voicemails left after 4:00 p.m. will not be returned until the following business day.  For prescription refill requests, have your pharmacy contact our office and allow 72 hours.    Due to Covid, you will need to wear a mask upon entering the hospital. If you do not have a mask, a mask will be given to you at the Main Entrance upon arrival. For doctor visits, patients may have 1 support person with them. For treatment visits, patients can not have anyone with them due to social distancing guidelines and our immunocompromised population.     

## 2020-01-14 NOTE — Progress Notes (Signed)
Patient has been assessed, vital signs and labs have been reviewed by Dr. Katragadda. ANC, Creatinine, LFTs, and Platelets are within treatment parameters per Dr. Katragadda. The patient is good to proceed with treatment at this time.  

## 2020-01-14 NOTE — Patient Instructions (Signed)
[facility] Discharge Instructions for Patients Receiving Chemotherapy  Today you received the following chemotherapy agents   To help prevent nausea and vomiting after your treatment, we encourage you to take your nausea medication   If you develop nausea and vomiting that is not controlled by your nausea medication, call the clinic.   BELOW ARE SYMPTOMS THAT SHOULD BE REPORTED IMMEDIATELY:  *FEVER GREATER THAN 100.5 F  *CHILLS WITH OR WITHOUT FEVER  NAUSEA AND VOMITING THAT IS NOT CONTROLLED WITH YOUR NAUSEA MEDICATION  *UNUSUAL SHORTNESS OF BREATH  *UNUSUAL BRUISING OR BLEEDING  TENDERNESS IN MOUTH AND THROAT WITH OR WITHOUT PRESENCE OF ULCERS  *URINARY PROBLEMS  *BOWEL PROBLEMS  UNUSUAL RASH Items with * indicate a potential emergency and should be followed up as soon as possible.  Feel free to call the clinic should you have any questions or concerns. The clinic phone number is [phone].  Please show the CHEMO ALERT CARD at check-in to the Emergency Department and triage nurse.   

## 2020-01-15 LAB — CANCER ANTIGEN 27.29: CA 27.29: 11.5 U/mL (ref 0.0–38.6)

## 2020-02-04 ENCOUNTER — Inpatient Hospital Stay (HOSPITAL_COMMUNITY): Payer: Medicare Other

## 2020-02-04 ENCOUNTER — Inpatient Hospital Stay (HOSPITAL_COMMUNITY): Payer: Medicare Other | Attending: Hematology | Admitting: Hematology

## 2020-02-04 ENCOUNTER — Other Ambulatory Visit: Payer: Self-pay

## 2020-02-04 VITALS — BP 150/67 | HR 55 | Temp 96.8°F | Resp 18

## 2020-02-04 VITALS — BP 125/37 | HR 53 | Temp 96.8°F | Resp 18 | Wt 106.8 lb

## 2020-02-04 DIAGNOSIS — K449 Diaphragmatic hernia without obstruction or gangrene: Secondary | ICD-10-CM | POA: Insufficient documentation

## 2020-02-04 DIAGNOSIS — Z171 Estrogen receptor negative status [ER-]: Secondary | ICD-10-CM | POA: Diagnosis not present

## 2020-02-04 DIAGNOSIS — Z836 Family history of other diseases of the respiratory system: Secondary | ICD-10-CM | POA: Insufficient documentation

## 2020-02-04 DIAGNOSIS — F329 Major depressive disorder, single episode, unspecified: Secondary | ICD-10-CM | POA: Insufficient documentation

## 2020-02-04 DIAGNOSIS — Z9012 Acquired absence of left breast and nipple: Secondary | ICD-10-CM | POA: Insufficient documentation

## 2020-02-04 DIAGNOSIS — R197 Diarrhea, unspecified: Secondary | ICD-10-CM | POA: Insufficient documentation

## 2020-02-04 DIAGNOSIS — Z8249 Family history of ischemic heart disease and other diseases of the circulatory system: Secondary | ICD-10-CM | POA: Insufficient documentation

## 2020-02-04 DIAGNOSIS — C50912 Malignant neoplasm of unspecified site of left female breast: Secondary | ICD-10-CM

## 2020-02-04 DIAGNOSIS — R634 Abnormal weight loss: Secondary | ICD-10-CM | POA: Insufficient documentation

## 2020-02-04 DIAGNOSIS — G479 Sleep disorder, unspecified: Secondary | ICD-10-CM | POA: Diagnosis not present

## 2020-02-04 DIAGNOSIS — I10 Essential (primary) hypertension: Secondary | ICD-10-CM | POA: Diagnosis not present

## 2020-02-04 DIAGNOSIS — Z79899 Other long term (current) drug therapy: Secondary | ICD-10-CM | POA: Insufficient documentation

## 2020-02-04 DIAGNOSIS — R590 Localized enlarged lymph nodes: Secondary | ICD-10-CM | POA: Insufficient documentation

## 2020-02-04 DIAGNOSIS — Z87442 Personal history of urinary calculi: Secondary | ICD-10-CM | POA: Diagnosis not present

## 2020-02-04 DIAGNOSIS — K5730 Diverticulosis of large intestine without perforation or abscess without bleeding: Secondary | ICD-10-CM | POA: Diagnosis not present

## 2020-02-04 DIAGNOSIS — K5900 Constipation, unspecified: Secondary | ICD-10-CM | POA: Diagnosis not present

## 2020-02-04 DIAGNOSIS — Z5112 Encounter for antineoplastic immunotherapy: Secondary | ICD-10-CM | POA: Insufficient documentation

## 2020-02-04 DIAGNOSIS — C50812 Malignant neoplasm of overlapping sites of left female breast: Secondary | ICD-10-CM | POA: Diagnosis not present

## 2020-02-04 DIAGNOSIS — I700 Atherosclerosis of aorta: Secondary | ICD-10-CM | POA: Diagnosis not present

## 2020-02-04 DIAGNOSIS — M79652 Pain in left thigh: Secondary | ICD-10-CM | POA: Insufficient documentation

## 2020-02-04 DIAGNOSIS — C7801 Secondary malignant neoplasm of right lung: Secondary | ICD-10-CM | POA: Insufficient documentation

## 2020-02-04 DIAGNOSIS — N281 Cyst of kidney, acquired: Secondary | ICD-10-CM | POA: Insufficient documentation

## 2020-02-04 DIAGNOSIS — Z9221 Personal history of antineoplastic chemotherapy: Secondary | ICD-10-CM | POA: Diagnosis not present

## 2020-02-04 DIAGNOSIS — M47816 Spondylosis without myelopathy or radiculopathy, lumbar region: Secondary | ICD-10-CM | POA: Diagnosis not present

## 2020-02-04 LAB — COMPREHENSIVE METABOLIC PANEL
ALT: 25 U/L (ref 0–44)
AST: 23 U/L (ref 15–41)
Albumin: 3.3 g/dL — ABNORMAL LOW (ref 3.5–5.0)
Alkaline Phosphatase: 56 U/L (ref 38–126)
Anion gap: 8 (ref 5–15)
BUN: 27 mg/dL — ABNORMAL HIGH (ref 8–23)
CO2: 22 mmol/L (ref 22–32)
Calcium: 9 mg/dL (ref 8.9–10.3)
Chloride: 109 mmol/L (ref 98–111)
Creatinine, Ser: 0.93 mg/dL (ref 0.44–1.00)
GFR calc Af Amer: >60 mL/min (ref >60)
GFR calc non Af Amer: 53 mL/min — ABNORMAL LOW (ref >60)
Glucose, Bld: 119 mg/dL — ABNORMAL HIGH (ref 70–99)
Potassium: 4.2 mmol/L (ref 3.5–5.1)
Sodium: 139 mmol/L (ref 135–145)
Total Bilirubin: 0.5 mg/dL (ref 0.3–1.2)
Total Protein: 5.9 g/dL — ABNORMAL LOW (ref 6.5–8.1)

## 2020-02-04 LAB — CBC WITH DIFFERENTIAL/PLATELET
Abs Immature Granulocytes: 0.02 10*3/uL (ref 0.00–0.07)
Basophils Absolute: 0 10*3/uL (ref 0.0–0.1)
Basophils Relative: 0 %
Eosinophils Absolute: 0.1 10*3/uL (ref 0.0–0.5)
Eosinophils Relative: 2 %
HCT: 34.9 % — ABNORMAL LOW (ref 36.0–46.0)
Hemoglobin: 11.3 g/dL — ABNORMAL LOW (ref 12.0–15.0)
Immature Granulocytes: 0 %
Lymphocytes Relative: 22 %
Lymphs Abs: 1 10*3/uL (ref 0.7–4.0)
MCH: 32.8 pg (ref 26.0–34.0)
MCHC: 32.4 g/dL (ref 30.0–36.0)
MCV: 101.5 fL — ABNORMAL HIGH (ref 80.0–100.0)
Monocytes Absolute: 0.3 10*3/uL (ref 0.1–1.0)
Monocytes Relative: 7 %
Neutro Abs: 3.2 10*3/uL (ref 1.7–7.7)
Neutrophils Relative %: 69 %
Platelets: 162 10*3/uL (ref 150–400)
RBC: 3.44 MIL/uL — ABNORMAL LOW (ref 3.87–5.11)
RDW: 16.1 % — ABNORMAL HIGH (ref 11.5–15.5)
WBC: 4.6 10*3/uL (ref 4.0–10.5)
nRBC: 0 % (ref 0.0–0.2)

## 2020-02-04 MED ORDER — SODIUM CHLORIDE 0.9 % IV SOLN
10.0000 mg | Freq: Once | INTRAVENOUS | Status: AC
  Administered 2020-02-04: 10 mg via INTRAVENOUS
  Filled 2020-02-04: qty 10

## 2020-02-04 MED ORDER — DEXTROSE 5 % IV SOLN: Freq: Once | INTRAVENOUS | Status: AC

## 2020-02-04 MED ORDER — FAM-TRASTUZUMAB DERUXTECAN-NXKI CHEMO 100 MG IV SOLR
3.2000 mg/kg | Freq: Once | INTRAVENOUS | Status: AC
  Administered 2020-02-04: 168 mg via INTRAVENOUS
  Filled 2020-02-04: qty 8.4

## 2020-02-04 MED ORDER — HEPARIN SOD (PORK) LOCK FLUSH 100 UNIT/ML IV SOLN
500.0000 [IU] | Freq: Once | INTRAVENOUS | Status: AC | PRN
  Administered 2020-02-04: 500 [IU]

## 2020-02-04 MED ORDER — SODIUM CHLORIDE 0.9% FLUSH
10.0000 mL | INTRAVENOUS | Status: DC | PRN
  Administered 2020-02-04: 10 mL

## 2020-02-04 MED ORDER — ACETAMINOPHEN 325 MG PO TABS
650.0000 mg | ORAL_TABLET | Freq: Once | ORAL | Status: AC
  Administered 2020-02-04: 650 mg via ORAL
  Filled 2020-02-04: qty 2

## 2020-02-04 MED ORDER — PALONOSETRON HCL INJECTION 0.25 MG/5ML
0.2500 mg | Freq: Once | INTRAVENOUS | Status: AC
  Administered 2020-02-04: 0.25 mg via INTRAVENOUS
  Filled 2020-02-04: qty 5

## 2020-02-04 MED ORDER — DIPHENHYDRAMINE HCL 25 MG PO CAPS
25.0000 mg | ORAL_CAPSULE | Freq: Once | ORAL | Status: AC
  Administered 2020-02-04: 25 mg via ORAL
  Filled 2020-02-04: qty 1

## 2020-02-04 NOTE — Patient Instructions (Addendum)
[place] at [facility] Discharge Instructions  You were seen today by Dr. Delton Coombes. He went over your recent results. You will be scheduled for an echocardiogram. Please return to the clinic in 3 weeks for labs and follow up.   Thank you for choosing [place] at [facility] to provide your oncology and hematology care.  To afford each patient quality time with our provider, please arrive at least 15 minutes before your scheduled appointment time.   If you have a lab appointment with the [place] please come in thru the Main Entrance and check in at the main information desk  You need to re-schedule your appointment should you arrive 10 or more minutes late.  We strive to give you quality time with our providers, and arriving late affects you and other patients whose appointments are after yours.  Also, if you no show three or more times for appointments you may be dismissed from the clinic at the providers discretion.     Again, thank you for choosing [facility].  Our hope is that these requests will decrease the amount of time that you wait before being seen by our physicians.       _____________________________________________________________  Should you have questions after your visit to [facility], please contact our office at [phone] between the hours of 8:00 a.m. and 4:30 p.m.  Voicemails left after 4:00 p.m. will not be returned until the following business day.  For prescription refill requests, have your pharmacy contact our office and allow 72 hours.    Cancer Center Support Programs:   > Cancer Support Group  2nd Tuesday of the month 1pm-2pm, Journey Room

## 2020-02-04 NOTE — Progress Notes (Signed)
[facility] [place], [place] [address]   CLINIC:  Medical Oncology/Hematology  PCP:  Carol Fire, MD [place] / [place] [address] [phone]   REASON FOR VISIT:  Follow-up for invasive ductal carcinoma of the left breast  PRIOR THERAPY: 10 cycles of trastuzumab (Herceptin), 6 cycles of pertuzumab (Perjeta), 8 cycles of emtansine (Kadcyla)  NGS Results: Not done.  CURRENT THERAPY: Enhertu & Aloxi  BRIEF ONCOLOGIC HISTORY:  Oncology History  Invasive ductal carcinoma of left breast ([facility])  03/30/2017 Initial Diagnosis   Invasive ductal carcinoma of left breast ([place])   08/09/2018 - 03/20/2019 Chemotherapy   The patient had trastuzumab (HERCEPTIN) 500 mg in sodium chloride 0.9 % 250 mL chemo infusion, 525 mg, Intravenous,  Once, 10 of 10 cycles Administration: 500 mg (08/09/2018), 350 mg (09/06/2018), 350 mg (09/27/2018), 378 mg (10/18/2018), 378 mg (11/15/2018), 378 mg (12/06/2018), 378 mg (12/28/2018), 378 mg (01/18/2019), 378 mg (02/08/2019) pertuzumab (PERJETA) 840 mg in sodium chloride 0.9 % 250 mL chemo infusion, 840 mg (100 % of original dose 840 mg), Intravenous, Once, 6 of 6 cycles Dose modification: 840 mg (original dose 840 mg, Cycle 5), 420 mg (original dose 420 mg, Cycle 6) Administration: 840 mg (11/15/2018), 420 mg (12/06/2018), 420 mg (12/28/2018), 420 mg (01/18/2019), 420 mg (02/08/2019)  for chemotherapy treatment.    09/18/2018 Genetic Testing   Negative genetic testing for the breast and gyn cancer panel.  The Breast/GYN gene panel offered by [facility] includes sequencing and rearrangement analysis for the following 23 genes:  ATM, BRCA1, BRCA2, BRIP1, CDH1, CHEK2, EPCAM, MLH1, MSH2, MSH6, NBN, NF1, PALB2, PMS2, PTEN, RAD51C, RAD51D, STK11, and TP53.   The report date is 09/18/2018.   03/12/2019 - 09/09/2019 Chemotherapy   The patient had ado-trastuzumab emtansine (KADCYLA) 140 mg in sodium chloride 0.9 % 250 mL chemo infusion, 2.4 mg/kg =  140 mg (100 % of original dose 2.4 mg/kg), Intravenous, Once, 8 of 9 cycles Dose modification: 2.4 mg/kg (original dose 2.4 mg/kg, Cycle 1, Reason: Patient Age), 3 mg/kg (original dose 2.4 mg/kg, Cycle 3, Reason: Provider Judgment) Administration: 140 mg (03/12/2019), 180 mg (04/23/2019), 180 mg (06/18/2019), 180 mg (04/02/2019), 180 mg (05/28/2019), 180 mg (07/09/2019), 180 mg (07/30/2019), 180 mg (08/20/2019)  for chemotherapy treatment.    10/22/2019 -  Chemotherapy   The patient had palonosetron (ALOXI) injection 0.25 mg, 0.25 mg, Intravenous,  Once, 5 of 6 cycles Administration: 0.25 mg (10/22/2019), 0.25 mg (11/12/2019), 0.25 mg (01/14/2020), 0.25 mg (12/03/2019), 0.25 mg (12/24/2019) fam-trastuzumab deruxtecan-nxki (ENHERTU) 168 mg in dextrose 5 % 100 mL chemo infusion, 3.2 mg/kg = 168 mg (100 % of original dose 3.2 mg/kg), Intravenous,  Once, 5 of 6 cycles Dose modification: 3.2 mg/kg (original dose 3.2 mg/kg, Cycle 1, Reason: Provider Judgment), 3.2 mg/kg (original dose 3.2 mg/kg, Cycle 2, Reason: Patient Age), 3.2 mg/kg (original dose 3.2 mg/kg, Cycle 5, Reason: Patient Age), 3.2 mg/kg (original dose 3.2 mg/kg, Cycle 3, Reason: Provider Judgment), 3.2 mg/kg (original dose 3.2 mg/kg, Cycle 4, Reason: Provider Judgment) Administration: 168 mg (10/22/2019), 168 mg (11/12/2019), 168 mg (01/14/2020), 168 mg (12/03/2019), 168 mg (12/24/2019)  for chemotherapy treatment.      CANCER STAGING: Cancer Staging No matching staging information was found for the patient.  INTERVAL HISTORY:  [name], a 86 y.o. female, returns for routine follow-up and consideration for next cycle of chemotherapy. Carol Duarte was last seen on 01/14/2020.  Due for cycle #6 of Enhertu & Aloxi  today.   She ambulates with a walker. She is still taking Marinol even though her appetite is good and she eats everything. She reports that her L leg pain is nearly resolved. She denies having any other pain. She denies having N/V,  abdominal pain, but she is reporting easy bruising on bilat forearms. Overall, she tells me she has been feeling pretty well.   Overall, she feels ready for next cycle of chemo today.    REVIEW OF SYSTEMS:  Review of Systems  Constitutional: Positive for appetite change (moderately decreased) and fatigue (moderate).  Respiratory: Negative for shortness of breath.   Gastrointestinal: Positive for constipation and diarrhea.  Musculoskeletal: Negative for arthralgias.  Psychiatric/Behavioral: Positive for depression.  All other systems reviewed and are negative.   PAST MEDICAL/SURGICAL HISTORY:  Past Medical History:  Diagnosis Date  . Cancer ([facility])    left breast  . History of kidney stones   . Hypercholesteremia   . Hypertension    Past Surgical History:  Procedure Laterality Date  . APPENDECTOMY    . brain cyst removed  2011  . BREAST BIOPSY Left 03/28/2018   Procedure: BREAST BIOPSY;  Surgeon: [name], MD;  Location: [facility];  Service: General;  Laterality: Left;  . CATARACT EXTRACTION W/PHACO Left 11/09/2015   Procedure: CATARACT EXTRACTION PHACO AND INTRAOCULAR LENS PLACEMENT LEFT EYE cde=8.97;  Surgeon: [name], MD;  Location: [facility];  Service: Ophthalmology;  Laterality: Left;  . CATARACT EXTRACTION W/PHACO Right 02/04/2019   Procedure: CATARACT EXTRACTION PHACO AND INTRAOCULAR LENS PLACEMENT (IOC);  Surgeon: [name], MD;  Location: [facility];  Service: Ophthalmology;  Laterality: Right;  CDE: 15.19  . EYE SURGERY     KPE left  . MASTECTOMY MODIFIED RADICAL Left 05/28/2018   Procedure: LEFT MODIFIED RADICAL MASTECTOMY;  Surgeon: [name], MD;  Location: [facility];  Service: General;  Laterality: Left;  Marland Kitchen MASTECTOMY, PARTIAL Left 04/24/2017   Procedure: MASTECTOMY PARTIAL;  Surgeon: [name], MD;  Location: [facility];  Service: General;  Laterality: Left;  . ORIF FEMUR FRACTURE Left 09/03/2019   Procedure: OPEN REDUCTION INTERNAL FIXATION (ORIF) LEFT HIP FEMUR  FRACTURE;  Surgeon: Paralee Cancel, MD;  Location: [facility];  Service: Orthopedics;  Laterality: Left;  . PORTACATH PLACEMENT Right 08/08/2018   Procedure: INSERTION PORT-A-CATH;  Surgeon: [name], MD;  Location: [facility];  Service: General;  Laterality: Right;  . TONSILLECTOMY    . TONSILLECTOMY AND ADENOIDECTOMY      SOCIAL HISTORY:  Social History   Socioeconomic History  . Marital status: Widowed    Spouse name: Not on file  . Number of children: Not on file  . Years of education: Not on file  . Highest education level: Not on file  Occupational History  . Not on file  Tobacco Use  . Smoking status: Never Smoker  . Smokeless tobacco: Never Used  Substance and Sexual Activity  . Alcohol use: No  . Drug use: No  . Sexual activity: Not Currently    Partners: Male  Other Topics Concern  . Not on file  Social History Narrative  . Not on file   Social Determinants of Health   Financial Resource Strain:   . Difficulty of Paying Living Expenses:   Food Insecurity:   . Worried About Charity fundraiser in the Last Year:   . Arboriculturist in the Last Year:   Transportation Needs:   . Film/video editor (Medical):   Marland Kitchen  Lack of Transportation (Non-Medical):   Physical Activity:   . Days of Exercise per Week:   . Minutes of Exercise per Session:   Stress:   . Feeling of Stress :   Social Connections:   . Frequency of Communication with Friends and Family:   . Frequency of Social Gatherings with Friends and Family:   . Attends Religious Services:   . Active Member of Clubs or Organizations:   . Attends Archivist Meetings:   Marland Kitchen Marital Status:   Intimate Partner Violence:   . Fear of Current or Ex-Partner:   . Emotionally Abused:   Marland Kitchen Physically Abused:   . Sexually Abused:     FAMILY HISTORY:  Family History  Problem Relation Age of Onset  . Heart failure Mother   . Heart failure Father   . Congestive Heart Failure Brother   . Tuberculosis  Paternal Uncle   . Tuberculosis Maternal Grandmother   . Tuberculosis Maternal Grandfather   . Congestive Heart Failure Brother     CURRENT MEDICATIONS:  Current Outpatient Medications  Medication Sig Dispense Refill  . Ado-Trastuzumab Emtansine (KADCYLA IV) Inject into the vein every 21 ( twenty-one) days.    Marland Kitchen amLODipine (NORVASC) 5 MG tablet Take 5 mg by mouth daily.    Marland Kitchen dronabinol (MARINOL) 2.5 MG capsule Take 1 capsule (2.5 mg total) by mouth 2 (two) times daily before a meal. 60 capsule 1  . ferrous sulfate 325 (65 FE) MG tablet Take 325 mg by mouth daily with breakfast.    . labetalol (NORMODYNE) 100 MG tablet Take 1 tablet (100 mg total) by mouth 2 (two) times daily. 60 tablet 12  . oxybutynin (DITROPAN) 5 MG tablet Take 5 mg by mouth daily.     Marland Kitchen PREVIDENT 5000 BOOSTER PLUS 1.1 % PSTE APPLY WITH TOOTHBRUSH AT BEDTIME AS DIRECTED.    Marland Kitchen simvastatin (ZOCOR) 40 MG tablet Take 40 mg by mouth daily.    . temazepam (RESTORIL) 30 MG capsule Take 1 capsule (30 mg total) by mouth at bedtime as needed for sleep. May increase dose to 2 capsules if 1 capsule does not help. 90 capsule 1   No current facility-administered medications for this visit.    ALLERGIES:  No Known Allergies  PHYSICAL EXAM:  Performance status (ECOG): 2 - Symptomatic, <50% confined to bed  There were no vitals filed for this visit. Wt Readings from Last 3 Encounters:  01/14/20 108 lb 11.2 oz (49.3 kg)  12/24/19 109 lb 3.2 oz (49.5 kg)  12/03/19 112 lb 12.8 oz (51.2 kg)   Physical Exam Vitals reviewed.  Constitutional:      Appearance: Normal appearance.  Cardiovascular:     Rate and Rhythm: Normal rate and regular rhythm.     Pulses: Normal pulses.     Heart sounds: Normal heart sounds.  Pulmonary:     Effort: Pulmonary effort is normal.     Breath sounds: Normal breath sounds.  Abdominal:     Palpations: Abdomen is soft.     Tenderness: There is no abdominal tenderness.  Musculoskeletal:     Right  lower leg: No edema.     Left lower leg: No edema.  Skin:    Findings: Bruising (bilat forearms) present.  Neurological:     General: No focal deficit present.     Mental Status: She is alert and oriented to person, place, and time.  Psychiatric:        Mood and Affect: Mood normal.  Behavior: Behavior normal.     LABORATORY DATA:  I have reviewed the labs as listed.  CBC Latest Ref Rng & Units 02/04/2020 01/14/2020 12/24/2019  WBC 4.0 - 10.5 K/uL 4.6 4.7 4.3  Hemoglobin 12.0 - 15.0 g/dL 11.3(L) 11.4(L) 11.5(L)  Hematocrit 36.0 - 46.0 % 34.9(L) 36.1 36.3  Platelets 150 - 400 K/uL 162 180 196   CMP Latest Ref Rng & Units 01/14/2020 12/24/2019 12/03/2019  Glucose 70 - 99 mg/dL 149(H) 114(H) 118(H)  BUN 8 - 23 mg/dL 32(H) 23 27(H)  Creatinine 0.44 - 1.00 mg/dL 0.90 0.93 0.92  Sodium 135 - 145 mmol/L 140 140 140  Potassium 3.5 - 5.1 mmol/L 4.1 4.3 4.2  Chloride 98 - 111 mmol/L 105 106 108  CO2 22 - 32 mmol/L _0 Calcium 8.9 - 10.3 mg/dL 9.1 8.9 9.0  Total Protein 6.5 - 8.1 g/dL 5.8(L) 5.8(L) 6.1(L)  Total Bilirubin 0.3 - 1.2 mg/dL 0.6 0.6 0.5  Alkaline Phos 38 - 126 U/L 59 61 58  AST 15 - 41 U/L _1 ALT 0 - 44 U/L _2 DIAGNOSTIC IMAGING:  I have independently reviewed the scans and discussed with the patient. CT Chest W Contrast  Result Date: 01/10/2020 CLINICAL DATA:  Metastatic left breast cancer status post mastectomy and chemotherapy. Patient reports 15 pound weight loss over the last 6 months with nausea and constipation. Prior appendectomy. Restaging. EXAM: CT CHEST, ABDOMEN, AND PELVIS WITH CONTRAST TECHNIQUE: Multidetector CT imaging of the chest, abdomen and pelvis was performed following the standard protocol during bolus administration of intravenous contrast. CONTRAST:  66m OMNIPAQUE IOHEXOL 300 MG/ML  SOLN COMPARISON:  10/14/2019 CT chest, abdomen and pelvis. FINDINGS: CT CHEST FINDINGS Cardiovascular: Normal heart size. No significant pericardial  effusion/thickening. Atherosclerotic nonaneurysmal thoracic aorta. Normal caliber main pulmonary artery. No central pulmonary emboli. Mediastinum/Nodes: No discrete thyroid nodules. Unremarkable esophagus. Stable calcified 1.0 cm left axillary nodule (series 2/image 16). No pathologically enlarged noncalcified axillary nodes. Mildly prominent supraclavicular nodes have decreased, for example measuring 0.8 cm on the right (series 2/image 6), previously 1.1 cm. Right paratracheal adenopathy is decreased from 1.1 cm to 0.9 cm (series 2/image 17). Mildly enlarged 1.2 cm subcarinal node (series 2/image 26), stable. Irregular left infrahilar 5.4 x 5.4 cm mass (series 2/image 36), previously 5.8 x 5.5 cm, slightly decreased. No new pathologically enlarged mediastinal or hilar nodes. Lungs/Pleura: No pneumothorax. No pleural effusion. Innumerable (> than 20) irregular solid pulmonary nodules scattered throughout both lungs. Representative medial right middle lobe 2.1 cm nodule (series 3/image 70), decreased from 2.5 cm. Representative posterior right lower lobe 2.4 cm nodule (series 3/image 82), decreased from 2.8 cm. Representative 1.6 cm anterior left upper lobe nodule (series 3/image 49), previously 1.6 cm, stable. No new or enlarging pulmonary nodules. Musculoskeletal: No aggressive appearing focal osseous lesions. Mild thoracic spondylosis. Left mastectomy. CT ABDOMEN PELVIS FINDINGS Hepatobiliary: Normal liver size. Simple left liver lobe cysts, largest 2.4 cm. Several subcentimeter hypodense liver lesions, too small to characterize, unchanged. No new liver lesions. Normal gallbladder with no radiopaque cholelithiasis. No biliary ductal dilatation. Pancreas: Normal, with no mass or duct dilation. Spleen: Normal size. No mass. Adrenals/Urinary Tract: Normal adrenals. Simple 1.1 cm lower right renal cyst. Additional subcentimeter scattered hypodense renal cortical lesions are too small to characterize and require no  follow-up. No hydronephrosis. Normal bladder. Stomach/Bowel: Moderate hiatal hernia. Otherwise normal nondistended stomach. Normal caliber small bowel with no small bowel Bradwell thickening. Appendectomy.  Oral contrast transits to the colon. Mild sigmoid diverticulosis, with no large bowel Fritzsche thickening or acute pericolonic fat stranding. Vascular/Lymphatic: Atherosclerotic nonaneurysmal abdominal aorta. Patent portal, splenic, hepatic and renal veins. No pathologically enlarged lymph nodes in the abdomen or pelvis. Reproductive: Grossly normal uterus.  No adnexal mass. Other: No pneumoperitoneum, ascites or focal fluid collection. Musculoskeletal: No aggressive appearing focal osseous lesions. Partially visualized fixation hardware in the proximal left femur. Marked lumbar spondylosis. IMPRESSION: 1. Mild positive response to therapy. Supraclavicular and mediastinal lymphadenopathy is mildly decreased. Widespread pulmonary metastases, slightly decreased in size. 2. No evidence of metastatic disease in the abdomen or pelvis. 3. Chronic findings include: Aortic Atherosclerosis (ICD10-I70.0). Moderate hiatal hernia. Mild sigmoid diverticulosis. Electronically Signed   By: Ilona Sorrel M.D.   On: 01/10/2020 13:46   CT Abdomen Pelvis W Contrast  Result Date: 01/10/2020 CLINICAL DATA:  Metastatic left breast cancer status post mastectomy and chemotherapy. Patient reports 15 pound weight loss over the last 6 months with nausea and constipation. Prior appendectomy. Restaging. EXAM: CT CHEST, ABDOMEN, AND PELVIS WITH CONTRAST TECHNIQUE: Multidetector CT imaging of the chest, abdomen and pelvis was performed following the standard protocol during bolus administration of intravenous contrast. CONTRAST:  53m OMNIPAQUE IOHEXOL 300 MG/ML  SOLN COMPARISON:  10/14/2019 CT chest, abdomen and pelvis. FINDINGS: CT CHEST FINDINGS Cardiovascular: Normal heart size. No significant pericardial effusion/thickening. Atherosclerotic  nonaneurysmal thoracic aorta. Normal caliber main pulmonary artery. No central pulmonary emboli. Mediastinum/Nodes: No discrete thyroid nodules. Unremarkable esophagus. Stable calcified 1.0 cm left axillary nodule (series 2/image 16). No pathologically enlarged noncalcified axillary nodes. Mildly prominent supraclavicular nodes have decreased, for example measuring 0.8 cm on the right (series 2/image 6), previously 1.1 cm. Right paratracheal adenopathy is decreased from 1.1 cm to 0.9 cm (series 2/image 17). Mildly enlarged 1.2 cm subcarinal node (series 2/image 26), stable. Irregular left infrahilar 5.4 x 5.4 cm mass (series 2/image 36), previously 5.8 x 5.5 cm, slightly decreased. No new pathologically enlarged mediastinal or hilar nodes. Lungs/Pleura: No pneumothorax. No pleural effusion. Innumerable (> than 20) irregular solid pulmonary nodules scattered throughout both lungs. Representative medial right middle lobe 2.1 cm nodule (series 3/image 70), decreased from 2.5 cm. Representative posterior right lower lobe 2.4 cm nodule (series 3/image 82), decreased from 2.8 cm. Representative 1.6 cm anterior left upper lobe nodule (series 3/image 49), previously 1.6 cm, stable. No new or enlarging pulmonary nodules. Musculoskeletal: No aggressive appearing focal osseous lesions. Mild thoracic spondylosis. Left mastectomy. CT ABDOMEN PELVIS FINDINGS Hepatobiliary: Normal liver size. Simple left liver lobe cysts, largest 2.4 cm. Several subcentimeter hypodense liver lesions, too small to characterize, unchanged. No new liver lesions. Normal gallbladder with no radiopaque cholelithiasis. No biliary ductal dilatation. Pancreas: Normal, with no mass or duct dilation. Spleen: Normal size. No mass. Adrenals/Urinary Tract: Normal adrenals. Simple 1.1 cm lower right renal cyst. Additional subcentimeter scattered hypodense renal cortical lesions are too small to characterize and require no follow-up. No hydronephrosis. Normal  bladder. Stomach/Bowel: Moderate hiatal hernia. Otherwise normal nondistended stomach. Normal caliber small bowel with no small bowel Habib thickening. Appendectomy. Oral contrast transits to the colon. Mild sigmoid diverticulosis, with no large bowel Collard thickening or acute pericolonic fat stranding. Vascular/Lymphatic: Atherosclerotic nonaneurysmal abdominal aorta. Patent portal, splenic, hepatic and renal veins. No pathologically enlarged lymph nodes in the abdomen or pelvis. Reproductive: Grossly normal uterus.  No adnexal mass. Other: No pneumoperitoneum, ascites or focal fluid collection. Musculoskeletal: No aggressive appearing focal osseous lesions. Partially visualized fixation hardware in the proximal  left femur. Marked lumbar spondylosis. IMPRESSION: 1. Mild positive response to therapy. Supraclavicular and mediastinal lymphadenopathy is mildly decreased. Widespread pulmonary metastases, slightly decreased in size. 2. No evidence of metastatic disease in the abdomen or pelvis. 3. Chronic findings include: Aortic Atherosclerosis (ICD10-I70.0). Moderate hiatal hernia. Mild sigmoid diverticulosis. Electronically Signed   By: Ilona Sorrel M.D.   On: 01/10/2020 13:46   DG Epidural/Nerve Root  Result Date: 01/07/2020 CLINICAL DATA:  Left thigh and knee region pain. Good response to previous left L5 injections. EXAM: Left L5 nerve root block and trans foraminal epidural. COMPARISON:  Previous injection radiographs and lumbar imaging. TECHNIQUE: The overlying skin was prepped with Betadine and draped in sterile fashion. Skin anesthesia was carried out using 1% Lidocaine. A curved 22 gauge spinal needle was directed into the superior ventral neural foramen on the left at L5-S1.Injection of a few drops of Isovue 200 demonstrates spread outlining the left L5 nerve root. No intravascular extension. 19m Depo-Medrol and 2cc 1% Lidocaine were subsequently administered. No apparent complication. The patient  tolerated the procedure without difficulty and was transferred to recovery in excellent condition. FLUOROSCOPY TIME:  1 minutes 10 seconds. 59.93 micro gray meter squared IMPRESSION: Technically successful left L5 selective nerve root block and transforaminal epidural steroid injection. Electronically Signed   By: MNelson ChimesM.D.   On: 01/07/2020 09:27     ASSESSMENT:  1.  Metastatic HER-2 positive left breast cancer: -Enhertu started on 10/22/2019. -CT CAP on 01/10/2020 showed response to therapy with decreasing lung nodules as well as supraclavicular and mediastinal adenopathy with no new evidence of metastatic disease.   PLAN:  1.  Metastatic HER-2 positive left breast cancer: -I have reviewed her labs today.  They are adequate to proceed with next cycle of Enhertu. -She will come back in 3 weeks for follow-up.  2.  High risk drug monitoring: -Last echo was on 11/12/2019, EF 60 to 65%. -We will repeat echo prior to next visit in 3 weeks.  3.  Left leg pain: -She had another injection done.  She no longer has any pain at this time.  She ambulates with walker.  4.  Difficulty sleeping: -Continue mirtazapine 30 mg which is helping.  5.  Weight loss: -Lost 2 pounds since last visit.  She is taking Marinol 2.5 mg twice daily which is helping.  She reports that she has been eating but losing weight.  We will closely monitor.    Orders placed this encounter:  No orders of the defined types were placed in this encounter.    SDerek Jack MD [phone]  I, DMilinda Antis am acting as a scribe for Dr. SSanda Linger  I, SDerek JackMD, have reviewed the above documentation for accuracy and completeness, and I agree with the above.

## 2020-02-04 NOTE — Patient Instructions (Signed)
[facility] Discharge Instructions for Patients Receiving Chemotherapy  Today you received the following chemotherapy agents   To help prevent nausea and vomiting after your treatment, we encourage you to take your nausea medication   If you develop nausea and vomiting that is not controlled by your nausea medication, call the clinic.   BELOW ARE SYMPTOMS THAT SHOULD BE REPORTED IMMEDIATELY:  *FEVER GREATER THAN 100.5 F  *CHILLS WITH OR WITHOUT FEVER  NAUSEA AND VOMITING THAT IS NOT CONTROLLED WITH YOUR NAUSEA MEDICATION  *UNUSUAL SHORTNESS OF BREATH  *UNUSUAL BRUISING OR BLEEDING  TENDERNESS IN MOUTH AND THROAT WITH OR WITHOUT PRESENCE OF ULCERS  *URINARY PROBLEMS  *BOWEL PROBLEMS  UNUSUAL RASH Items with * indicate a potential emergency and should be followed up as soon as possible.  Feel free to call the clinic should you have any questions or concerns. The clinic phone number is [phone].  Please show the CHEMO ALERT CARD at check-in to the Emergency Department and triage nurse.   

## 2020-02-04 NOTE — Progress Notes (Signed)
Patient has been assessed, vital signs and labs have been reviewed by Dr. Katragadda. ANC, Creatinine, LFTs, and Platelets are within treatment parameters per Dr. Katragadda. The patient is good to proceed with treatment at this time.  

## 2020-02-04 NOTE — Progress Notes (Signed)
Treatment given today per MD orders. Tolerated infusion without adverse affects. Vital signs stable. No complaints at this time. Discharged from clinic ambulatory. F/U with [facility] as scheduled.   

## 2020-02-05 LAB — CANCER ANTIGEN 15-3: CA 15-3: 7 U/mL (ref 0.0–25.0)

## 2020-02-07 DIAGNOSIS — Z0001 Encounter for general adult medical examination with abnormal findings: Secondary | ICD-10-CM | POA: Diagnosis not present

## 2020-02-07 DIAGNOSIS — I10 Essential (primary) hypertension: Secondary | ICD-10-CM | POA: Diagnosis not present

## 2020-02-07 DIAGNOSIS — Z1389 Encounter for screening for other disorder: Secondary | ICD-10-CM | POA: Diagnosis not present

## 2020-02-07 DIAGNOSIS — E46 Unspecified protein-calorie malnutrition: Secondary | ICD-10-CM | POA: Diagnosis not present

## 2020-02-07 DIAGNOSIS — C50919 Malignant neoplasm of unspecified site of unspecified female breast: Secondary | ICD-10-CM | POA: Diagnosis not present

## 2020-02-17 ENCOUNTER — Other Ambulatory Visit (HOSPITAL_COMMUNITY): Payer: Self-pay | Admitting: *Deleted

## 2020-02-17 MED ORDER — TEMAZEPAM 30 MG PO CAPS: 30.0000 mg | ORAL_CAPSULE | Freq: Every evening | ORAL | 1 refills | Status: DC | PRN

## 2020-02-24 ENCOUNTER — Other Ambulatory Visit: Payer: Self-pay

## 2020-02-24 ENCOUNTER — Ambulatory Visit (HOSPITAL_COMMUNITY)
Admission: RE | Admit: 2020-02-24 | Discharge: 2020-02-24 | Disposition: A | Discharge status: Home / Self Care | Payer: Medicare Other | Source: Ambulatory Visit | Attending: Hematology | Admitting: Hematology

## 2020-02-24 DIAGNOSIS — Z0189 Encounter for other specified special examinations: Secondary | ICD-10-CM

## 2020-02-24 DIAGNOSIS — Z9012 Acquired absence of left breast and nipple: Secondary | ICD-10-CM | POA: Insufficient documentation

## 2020-02-24 DIAGNOSIS — E785 Hyperlipidemia, unspecified: Secondary | ICD-10-CM | POA: Insufficient documentation

## 2020-02-24 DIAGNOSIS — I358 Other nonrheumatic aortic valve disorders: Secondary | ICD-10-CM | POA: Insufficient documentation

## 2020-02-24 DIAGNOSIS — Z01818 Encounter for other preprocedural examination: Secondary | ICD-10-CM | POA: Diagnosis not present

## 2020-02-24 DIAGNOSIS — C50912 Malignant neoplasm of unspecified site of left female breast: Secondary | ICD-10-CM | POA: Diagnosis not present

## 2020-02-24 DIAGNOSIS — C50512 Malignant neoplasm of lower-outer quadrant of left female breast: Secondary | ICD-10-CM | POA: Insufficient documentation

## 2020-02-24 DIAGNOSIS — I119 Hypertensive heart disease without heart failure: Secondary | ICD-10-CM | POA: Insufficient documentation

## 2020-02-24 NOTE — Progress Notes (Signed)
*  PRELIMINARY RESULTS* Echocardiogram 2D Echocardiogram has been performed.  Carol Duarte 02/24/2020, 12:36 PM

## 2020-02-27 ENCOUNTER — Encounter (HOSPITAL_COMMUNITY): Payer: Self-pay

## 2020-02-27 ENCOUNTER — Inpatient Hospital Stay (HOSPITAL_BASED_OUTPATIENT_CLINIC_OR_DEPARTMENT_OTHER): Payer: Medicare Other | Admitting: Nurse Practitioner

## 2020-02-27 ENCOUNTER — Inpatient Hospital Stay (HOSPITAL_COMMUNITY): Payer: Medicare Other | Attending: Hematology

## 2020-02-27 ENCOUNTER — Inpatient Hospital Stay (HOSPITAL_COMMUNITY): Payer: Medicare Other

## 2020-02-27 VITALS — BP 147/60 | HR 60 | Temp 97.7°F | Resp 18

## 2020-02-27 DIAGNOSIS — C7800 Secondary malignant neoplasm of unspecified lung: Secondary | ICD-10-CM | POA: Insufficient documentation

## 2020-02-27 DIAGNOSIS — G479 Sleep disorder, unspecified: Secondary | ICD-10-CM | POA: Insufficient documentation

## 2020-02-27 DIAGNOSIS — Z8249 Family history of ischemic heart disease and other diseases of the circulatory system: Secondary | ICD-10-CM | POA: Insufficient documentation

## 2020-02-27 DIAGNOSIS — Z5112 Encounter for antineoplastic immunotherapy: Secondary | ICD-10-CM | POA: Insufficient documentation

## 2020-02-27 DIAGNOSIS — R590 Localized enlarged lymph nodes: Secondary | ICD-10-CM | POA: Diagnosis not present

## 2020-02-27 DIAGNOSIS — R5383 Other fatigue: Secondary | ICD-10-CM | POA: Diagnosis not present

## 2020-02-27 DIAGNOSIS — C50912 Malignant neoplasm of unspecified site of left female breast: Secondary | ICD-10-CM | POA: Diagnosis not present

## 2020-02-27 DIAGNOSIS — M7989 Other specified soft tissue disorders: Secondary | ICD-10-CM | POA: Insufficient documentation

## 2020-02-27 DIAGNOSIS — C50812 Malignant neoplasm of overlapping sites of left female breast: Secondary | ICD-10-CM | POA: Diagnosis not present

## 2020-02-27 DIAGNOSIS — Z836 Family history of other diseases of the respiratory system: Secondary | ICD-10-CM | POA: Diagnosis not present

## 2020-02-27 DIAGNOSIS — Z87442 Personal history of urinary calculi: Secondary | ICD-10-CM | POA: Insufficient documentation

## 2020-02-27 DIAGNOSIS — M25559 Pain in unspecified hip: Secondary | ICD-10-CM | POA: Insufficient documentation

## 2020-02-27 DIAGNOSIS — I10 Essential (primary) hypertension: Secondary | ICD-10-CM | POA: Diagnosis not present

## 2020-02-27 DIAGNOSIS — Z79899 Other long term (current) drug therapy: Secondary | ICD-10-CM | POA: Insufficient documentation

## 2020-02-27 DIAGNOSIS — Z9221 Personal history of antineoplastic chemotherapy: Secondary | ICD-10-CM | POA: Insufficient documentation

## 2020-02-27 DIAGNOSIS — R110 Nausea: Secondary | ICD-10-CM | POA: Diagnosis not present

## 2020-02-27 DIAGNOSIS — R635 Abnormal weight gain: Secondary | ICD-10-CM | POA: Diagnosis not present

## 2020-02-27 LAB — COMPREHENSIVE METABOLIC PANEL
ALT: 18 U/L (ref 0–44)
AST: 23 U/L (ref 15–41)
Albumin: 3.3 g/dL — ABNORMAL LOW (ref 3.5–5.0)
Alkaline Phosphatase: 53 U/L (ref 38–126)
Anion gap: 9 (ref 5–15)
BUN: 30 mg/dL — ABNORMAL HIGH (ref 8–23)
CO2: 25 mmol/L (ref 22–32)
Calcium: 9.3 mg/dL (ref 8.9–10.3)
Chloride: 106 mmol/L (ref 98–111)
Creatinine, Ser: 0.93 mg/dL (ref 0.44–1.00)
GFR calc Af Amer: >60 mL/min (ref >60)
GFR calc non Af Amer: 53 mL/min — ABNORMAL LOW (ref >60)
Glucose, Bld: 124 mg/dL — ABNORMAL HIGH (ref 70–99)
Potassium: 3.9 mmol/L (ref 3.5–5.1)
Sodium: 140 mmol/L (ref 135–145)
Total Bilirubin: 0.7 mg/dL (ref 0.3–1.2)
Total Protein: 5.9 g/dL — ABNORMAL LOW (ref 6.5–8.1)

## 2020-02-27 LAB — CBC WITH DIFFERENTIAL/PLATELET
Abs Immature Granulocytes: 0.02 10*3/uL (ref 0.00–0.07)
Basophils Absolute: 0 10*3/uL (ref 0.0–0.1)
Basophils Relative: 1 %
Eosinophils Absolute: 0.1 10*3/uL (ref 0.0–0.5)
Eosinophils Relative: 3 %
HCT: 34.3 % — ABNORMAL LOW (ref 36.0–46.0)
Hemoglobin: 10.9 g/dL — ABNORMAL LOW (ref 12.0–15.0)
Immature Granulocytes: 1 %
Lymphocytes Relative: 22 %
Lymphs Abs: 1 10*3/uL (ref 0.7–4.0)
MCH: 33.3 pg (ref 26.0–34.0)
MCHC: 31.8 g/dL (ref 30.0–36.0)
MCV: 104.9 fL — ABNORMAL HIGH (ref 80.0–100.0)
Monocytes Absolute: 0.4 10*3/uL (ref 0.1–1.0)
Monocytes Relative: 10 %
Neutro Abs: 2.8 10*3/uL (ref 1.7–7.7)
Neutrophils Relative %: 63 %
Platelets: 228 10*3/uL (ref 150–400)
RBC: 3.27 MIL/uL — ABNORMAL LOW (ref 3.87–5.11)
RDW: 15.5 % (ref 11.5–15.5)
WBC: 4.4 10*3/uL (ref 4.0–10.5)
nRBC: 0 % (ref 0.0–0.2)

## 2020-02-27 LAB — MAGNESIUM: Magnesium: 2.1 mg/dL (ref 1.7–2.4)

## 2020-02-27 LAB — LACTATE DEHYDROGENASE: LDH: 132 U/L (ref 98–192)

## 2020-02-27 MED ORDER — PALONOSETRON HCL INJECTION 0.25 MG/5ML
INTRAVENOUS | Status: AC
  Filled 2020-02-27: qty 5

## 2020-02-27 MED ORDER — HEPARIN SOD (PORK) LOCK FLUSH 100 UNIT/ML IV SOLN
500.0000 [IU] | Freq: Once | INTRAVENOUS | Status: AC | PRN
  Administered 2020-02-27: 500 [IU]

## 2020-02-27 MED ORDER — DEXTROSE 5 % IV SOLN: Freq: Once | INTRAVENOUS | Status: AC

## 2020-02-27 MED ORDER — DIPHENHYDRAMINE HCL 25 MG PO CAPS
25.0000 mg | ORAL_CAPSULE | Freq: Once | ORAL | Status: AC
  Administered 2020-02-27: 25 mg via ORAL

## 2020-02-27 MED ORDER — DIPHENHYDRAMINE HCL 25 MG PO CAPS
ORAL_CAPSULE | ORAL | Status: AC
  Filled 2020-02-27: qty 1

## 2020-02-27 MED ORDER — ACETAMINOPHEN 325 MG PO TABS
ORAL_TABLET | ORAL | Status: AC
  Filled 2020-02-27: qty 2

## 2020-02-27 MED ORDER — SODIUM CHLORIDE 0.9 % IV SOLN
10.0000 mg | Freq: Once | INTRAVENOUS | Status: AC
  Administered 2020-02-27: 10 mg via INTRAVENOUS
  Filled 2020-02-27: qty 10

## 2020-02-27 MED ORDER — SODIUM CHLORIDE 0.9% FLUSH
10.0000 mL | INTRAVENOUS | Status: DC | PRN
  Administered 2020-02-27: 10 mL

## 2020-02-27 MED ORDER — PALONOSETRON HCL INJECTION 0.25 MG/5ML
0.2500 mg | Freq: Once | INTRAVENOUS | Status: AC
  Administered 2020-02-27: 0.25 mg via INTRAVENOUS

## 2020-02-27 MED ORDER — ACETAMINOPHEN 325 MG PO TABS
650.0000 mg | ORAL_TABLET | Freq: Once | ORAL | Status: AC
  Administered 2020-02-27: 650 mg via ORAL

## 2020-02-27 MED ORDER — FAM-TRASTUZUMAB DERUXTECAN-NXKI CHEMO 100 MG IV SOLR
3.2000 mg/kg | Freq: Once | INTRAVENOUS | Status: AC
  Administered 2020-02-27: 168 mg via INTRAVENOUS
  Filled 2020-02-27: qty 8.4

## 2020-02-27 NOTE — Patient Instructions (Signed)
[facility] °Discharge Instructions ° °Follow up in 3 weeks ° ° °Thank you for choosing [facility] at [facility] to provide your oncology and hematology care.  To afford each patient quality time with our provider, please arrive at least 15 minutes before your scheduled appointment time.  ° °If you have a lab appointment with the Cancer Center please come in thru the Main Entrance and check in at the main information desk. ° °You need to re-schedule your appointment should you arrive 10 or more minutes late.  We strive to give you quality time with our providers, and arriving late affects you and other patients whose appointments are after yours.  Also, if you no show three or more times for appointments you may be dismissed from the clinic at the providers discretion.     °Again, thank you for choosing [facility].  Our hope is that these requests will decrease the amount of time that you wait before being seen by our physicians.       °_____________________________________________________________ ° °Should you have questions after your visit to [facility], please contact our office at [phone] between the hours of 8:00 a.m. and 4:30 p.m.  Voicemails left after 4:00 p.m. will not be returned until the following business day.  For prescription refill requests, have your pharmacy contact our office and allow 72 hours.   ° °Due to Covid, you will need to wear a mask upon entering the hospital. If you do not have a mask, a mask will be given to you at the Main Entrance upon arrival. For doctor visits, patients may have 1 support person with them. For treatment visits, patients can not have anyone with them due to social distancing guidelines and our immunocompromised population.  ° ° ° ° °

## 2020-02-27 NOTE — Assessment & Plan Note (Addendum)
1.  Metastatic HER-2 positive left breast cancer: -Enhertu started on 10/22/2019. -CT CAP on 01/10/2020 showed response to therapy with decreasing lung nodules as well as supraclavicular and mediastinal adenopathy with no new evidence of metastatic disease. -Labs done on 02/27/2020 showed LDH 132, WBC 4.4, hemoglobin 10.9, platelets 228, creatinine 0.93 -We will proceed with her next cycle -She will come back in 3 weeks with repeat labs treatment and follow-up.  2.  High risk drug monitoring: -Last echo was on 02/24/2020, EF 60 to 65%. -We will repeat echo in 3 months.  3.  Difficulty sleeping: -Continue Mirtazapine 30 mg which is helping  4.  Weight loss: -She is taking Marinol 2.5 mg twice daily which is helping. -She reports that she is eating better however she is still continue to lose weight.

## 2020-02-27 NOTE — Progress Notes (Signed)
Carol Duarte, [place] [id number]   CLINIC:  Medical Oncology/Hematology  PCP:  [name], MD [place] [id number] [phone]   REASON FOR VISIT: Follow-up for breast cancer   CURRENT THERAPY: Herceptin  BRIEF ONCOLOGIC HISTORY:  Oncology History  Invasive ductal carcinoma of left breast ([place])  03/30/2017 Initial Diagnosis   Invasive ductal carcinoma of left breast ([place])   08/09/2018 - 03/20/2019 Chemotherapy   The patient had trastuzumab (HERCEPTIN) 500 mg in sodium chloride 0.9 % 250 mL chemo infusion, 525 mg, Intravenous,  Once, 10 of 10 cycles Administration: 500 mg (08/09/2018), 350 mg (09/06/2018), 350 mg (09/27/2018), 378 mg (10/18/2018), 378 mg (11/15/2018), 378 mg (12/06/2018), 378 mg (12/28/2018), 378 mg (01/18/2019), 378 mg (02/08/2019) pertuzumab (PERJETA) 840 mg in sodium chloride 0.9 % 250 mL chemo infusion, 840 mg (100 % of original dose 840 mg), Intravenous, Once, 6 of 6 cycles Dose modification: 840 mg (original dose 840 mg, Cycle 5), 420 mg (original dose 420 mg, Cycle 6) Administration: 840 mg (11/15/2018), 420 mg (12/06/2018), 420 mg (12/28/2018), 420 mg (01/18/2019), 420 mg (02/08/2019)  for chemotherapy treatment.    09/18/2018 Genetic Testing   Negative genetic testing for the breast and gyn cancer panel.  The Breast/GYN gene panel offered by [facility] includes sequencing and rearrangement analysis for the following 23 genes:  ATM, BRCA1, BRCA2, BRIP1, CDH1, CHEK2, EPCAM, MLH1, MSH2, MSH6, NBN, NF1, PALB2, PMS2, PTEN, RAD51C, RAD51D, STK11, and TP53.   The report date is 09/18/2018.   03/12/2019 - 09/09/2019 Chemotherapy   The patient had ado-trastuzumab emtansine (KADCYLA) 140 mg in sodium chloride 0.9 % 250 mL chemo infusion, 2.4 mg/kg = 140 mg (100 % of original dose 2.4 mg/kg), Intravenous, Once, 8 of 9 cycles Dose modification: 2.4 mg/kg (original dose 2.4 mg/kg, Cycle 1, Reason: Patient Age), 3 mg/kg (original dose  2.4 mg/kg, Cycle 3, Reason: Provider Judgment) Administration: 140 mg (03/12/2019), 180 mg (04/23/2019), 180 mg (06/18/2019), 180 mg (04/02/2019), 180 mg (05/28/2019), 180 mg (07/09/2019), 180 mg (07/30/2019), 180 mg (08/20/2019)  for chemotherapy treatment.    10/22/2019 -  Chemotherapy   The patient had palonosetron (ALOXI) injection 0.25 mg, 0.25 mg, Intravenous,  Once, 7 of 8 cycles Administration: 0.25 mg (10/22/2019), 0.25 mg (11/12/2019), 0.25 mg (01/14/2020), 0.25 mg (02/04/2020), 0.25 mg (12/03/2019), 0.25 mg (12/24/2019) fam-trastuzumab deruxtecan-nxki (ENHERTU) 168 mg in dextrose 5 % 100 mL chemo infusion, 3.2 mg/kg = 168 mg (100 % of original dose 3.2 mg/kg), Intravenous,  Once, 7 of 8 cycles Dose modification: 3.2 mg/kg (original dose 3.2 mg/kg, Cycle 1, Reason: Provider Judgment), 3.2 mg/kg (original dose 3.2 mg/kg, Cycle 2, Reason: Patient Age), 3.2 mg/kg (original dose 3.2 mg/kg, Cycle 5, Reason: Patient Age), 3.2 mg/kg (original dose 3.2 mg/kg, Cycle 3, Reason: Provider Judgment), 3.2 mg/kg (original dose 3.2 mg/kg, Cycle 4, Reason: Provider Judgment) Administration: 168 mg (10/22/2019), 168 mg (11/12/2019), 168 mg (01/14/2020), 168 mg (02/04/2020), 168 mg (12/03/2019), 168 mg (12/24/2019)  for chemotherapy treatment.       INTERVAL HISTORY:  Carol Duarte 86 y.o. female returns for routine follow-up for metastatic breast cancer.  Patient reports she is doing well since her last treatment.  She has no new complaints at this time.  She does have an occasional nausea if she overeats.  However she is doing well. Denies any nausea, vomiting, or diarrhea. Denies any new pains. Had not noticed any recent bleeding such as epistaxis, hematuria or hematochezia.  recent chest pain on exertion, shortness of breath on minimal exertion, pre-syncopal episodes, or palpitations. Denies any numbness or tingling in hands or feet. Denies any recent fevers, infections, or recent hospitalizations. Patient reports appetite at  25% and energy level at 50%. ° ° ° ° °REVIEW OF SYSTEMS:  °Review of Systems  °Gastrointestinal: Positive for nausea (If she overeats).  °All other systems reviewed and are negative. ° ° ° °PAST MEDICAL/SURGICAL HISTORY:  °Past Medical History:  °Diagnosis Date  °• Cancer (HCC)   ° left breast  °• History of kidney stones   °• Hypercholesteremia   °• Hypertension   ° °Past Surgical History:  °Procedure Laterality Date  °• APPENDECTOMY    °• brain cyst removed  2011  °• BREAST BIOPSY Left 03/28/2018  ° Procedure: BREAST BIOPSY;  Surgeon: Jenkins, Mark, MD;  Location: [facility];  Service: General;  Laterality: Left;  °• CATARACT EXTRACTION W/PHACO Left 11/09/2015  ° Procedure: CATARACT EXTRACTION PHACO AND INTRAOCULAR LENS PLACEMENT LEFT EYE cde=8.97;  Surgeon: Kerry Hunt, MD;  Location: [facility];  Service: Ophthalmology;  Laterality: Left;  °• CATARACT EXTRACTION W/PHACO Right 02/04/2019  ° Procedure: CATARACT EXTRACTION PHACO AND INTRAOCULAR LENS PLACEMENT (IOC);  Surgeon: Wrzosek, James, MD;  Location: [facility];  Service: Ophthalmology;  Laterality: Right;  CDE: 15.19  °• EYE SURGERY    ° KPE left  °• MASTECTOMY MODIFIED RADICAL Left 05/28/2018  ° Procedure: LEFT MODIFIED RADICAL MASTECTOMY;  Surgeon: Jenkins, Mark, MD;  Location: [facility];  Service: General;  Laterality: Left;  °• MASTECTOMY, PARTIAL Left 04/24/2017  ° Procedure: MASTECTOMY PARTIAL;  Surgeon: Jenkins, Mark, MD;  Location: [facility];  Service: General;  Laterality: Left;  °• ORIF FEMUR FRACTURE Left 09/03/2019  ° Procedure: OPEN REDUCTION INTERNAL FIXATION (ORIF) LEFT HIP FEMUR FRACTURE;  Surgeon: Olin, Matthew, MD;  Location: [facility];  Service: Orthopedics;  Laterality: Left;  °• PORTACATH PLACEMENT Right 08/08/2018  ° Procedure: INSERTION PORT-A-CATH;  Surgeon: Jenkins, Mark, MD;  Location: [facility];  Service: General;  Laterality: Right;  °• TONSILLECTOMY    °• TONSILLECTOMY AND ADENOIDECTOMY    ° ° ° °SOCIAL HISTORY:  °Social History  ° °Socioeconomic History  °•  Marital status: Widowed  °  Spouse name: Not on file  °• Number of children: Not on file  °• Years of education: Not on file  °• Highest education level: Not on file  °Occupational History  °• Not on file  °Tobacco Use  °• Smoking status: Never Smoker  °• Smokeless tobacco: Never Used  °Vaping Use  °• Vaping Use: Never used  °Substance and Sexual Activity  °• Alcohol use: No  °• Drug use: No  °• Sexual activity: Not Currently  °  Partners: Male  °Other Topics Concern  °• Not on file  °Social History Narrative  °• Not on file  ° °Social Determinants of Health  ° °Financial Resource Strain:   °• Difficulty of Paying Living Expenses:   °Food Insecurity:   °• Worried About Running Out of Food in the Last Year:   °• Ran Out of Food in the Last Year:   °Transportation Needs:   °• Lack of Transportation (Medical):   °• Lack of Transportation (Non-Medical):   °Physical Activity:   °• Days of Exercise per Week:   °• Minutes of Exercise per Session:   °Stress:   °• Feeling of Stress :   °Social Connections:   °• Frequency of Communication with Friends and Family:   °•   Frequency of Social Gatherings with Friends and Family:   °• Attends Religious Services:   °• Active Member of Clubs or Organizations:   °• Attends Club or Organization Meetings:   °• Marital Status:   °Intimate Partner Violence:   °• Fear of Current or Ex-Partner:   °• Emotionally Abused:   °• Physically Abused:   °• Sexually Abused:   ° ° °FAMILY HISTORY:  °Family History  °Problem Relation Age of Onset  °• Heart failure Mother   °• Heart failure Father   °• Congestive Heart Failure Brother   °• Tuberculosis Paternal Uncle   °• Tuberculosis Maternal Grandmother   °• Tuberculosis Maternal Grandfather   °• Congestive Heart Failure Brother   ° ° °CURRENT MEDICATIONS:  °Outpatient Encounter Medications as of 02/27/2020  °Medication Sig  °• Ado-Trastuzumab Emtansine (KADCYLA IV) Inject into the vein every 21 ( twenty-one) days.  °• amLODipine (NORVASC) 5 MG tablet  Take 5 mg by mouth daily.  °• dronabinol (MARINOL) 2.5 MG capsule Take 1 capsule (2.5 mg total) by mouth 2 (two) times daily before a meal.  °• ferrous sulfate 325 (65 FE) MG tablet Take 325 mg by mouth daily with breakfast.  °• labetalol (NORMODYNE) 100 MG tablet Take 1 tablet (100 mg total) by mouth 2 (two) times daily.  °• oxybutynin (DITROPAN) 5 MG tablet Take 5 mg by mouth daily.   °• simvastatin (ZOCOR) 40 MG tablet Take 40 mg by mouth daily.  °• temazepam (RESTORIL) 30 MG capsule Take 1 capsule (30 mg total) by mouth at bedtime as needed for sleep. May increase dose to 2 capsules if 1 capsule does not help.  °• [DISCONTINUED] prochlorperazine (COMPAZINE) 10 MG tablet Take 1 tablet (10 mg total) by mouth every 6 (six) hours as needed (Nausea or vomiting). (Patient taking differently: Take 10 mg by mouth daily. )  °• PREVIDENT 5000 BOOSTER PLUS 1.1 % PSTE APPLY WITH TOOTHBRUSH AT BEDTIME AS DIRECTED.  ° °No facility-administered encounter medications on file as of 02/27/2020.  ° ° °ALLERGIES:  °No Known Allergies ° ° °PHYSICAL EXAM:  °ECOG Performance status: 1 ° °Vitals:  ° 02/27/20 0940  °BP: (!) 122/54  °Pulse: (!) 55  °Resp: 18  °Temp: 98.4 °F (36.9 °C)  °SpO2: 100%  ° °Filed Weights  ° 02/27/20 0940  °Weight: 104 lb 9.6 oz (47.4 kg)  ° °Physical Exam °Constitutional:   °   Appearance: Normal appearance. She is normal weight.  °Cardiovascular:  °   Rate and Rhythm: Normal rate and regular rhythm.  °   Heart sounds: Normal heart sounds.  °Pulmonary:  °   Effort: Pulmonary effort is normal.  °   Breath sounds: Normal breath sounds.  °Abdominal:  °   General: Bowel sounds are normal.  °   Palpations: Abdomen is soft.  °Musculoskeletal:     °   General: Normal range of motion.  °Skin: °   General: Skin is warm.  °Neurological:  °   Mental Status: She is alert and oriented to person, place, and time. Mental status is at baseline.  °Psychiatric:     °   Mood and Affect: Mood normal.     °   Behavior: Behavior  normal.     °   Thought Content: Thought content normal.     °   Judgment: Judgment normal.  ° ° ° ° °LABORATORY DATA:  °I have reviewed the labs as listed.  °CBC °   °Component Value   Date/Time  ° WBC 4.4 02/27/2020 1019  ° RBC 3.27 (L) 02/27/2020 1019  ° HGB 10.9 (L) 02/27/2020 1019  ° HCT 34.3 (L) 02/27/2020 1019  ° PLT 228 02/27/2020 1019  ° MCV 104.9 (H) 02/27/2020 1019  ° MCH 33.3 02/27/2020 1019  ° MCHC 31.8 02/27/2020 1019  ° RDW 15.5 02/27/2020 1019  ° LYMPHSABS 1.0 02/27/2020 1019  ° MONOABS 0.4 02/27/2020 1019  ° EOSABS 0.1 02/27/2020 1019  ° BASOSABS 0.0 02/27/2020 1019  ° °CMP Latest Ref Rng & Units 02/27/2020 02/04/2020 01/14/2020  °Glucose 70 - 99 mg/dL 124(H) 119(H) 149(H)  °BUN 8 - 23 mg/dL 30(H) 27(H) 32(H)  °Creatinine 0.44 - 1.00 mg/dL 0.93 0.93 0.90  °Sodium 135 - 145 mmol/L 140 139 140  °Potassium 3.5 - 5.1 mmol/L 3.9 4.2 4.1  °Chloride 98 - 111 mmol/L 106 109 105  °CO2 22 - 32 mmol/L 25 22 25  °Calcium 8.9 - 10.3 mg/dL 9.3 9.0 9.1  °Total Protein 6.5 - 8.1 g/dL 5.9(L) 5.9(L) 5.8(L)  °Total Bilirubin 0.3 - 1.2 mg/dL 0.7 0.5 0.6  °Alkaline Phos 38 - 126 U/L 53 56 59  °AST 15 - 41 U/L 23 23 20  °ALT 0 - 44 U/L 18 25 23  ° ° °All questions were answered to patient's stated satisfaction. Encouraged patient to call with any new concerns or questions before his next visit to the cancer center and we can certain see him sooner, if needed.   ° ° °ASSESSMENT & PLAN:  °Invasive ductal carcinoma of left breast (HCC) °1.  Metastatic HER-2 positive left breast cancer: °-Enhertu started on 10/22/2019. °-CT CAP on 01/10/2020 showed response to therapy with decreasing lung nodules as well as supraclavicular and mediastinal adenopathy with no new evidence of metastatic disease. °-Labs done on 02/27/2020 showed LDH 132, WBC 4.4, hemoglobin 10.9, platelets 228, creatinine 0.93 °-We will proceed with her next cycle °-She will come back in 3 weeks with repeat labs treatment and follow-up. ° °2.  High risk drug  monitoring: °-Last echo was on 02/24/2020, EF 60 to 65%. °-We will repeat echo in 3 months. ° °3.  Difficulty sleeping: °-Continue Mirtazapine 30 mg which is helping ° °4.  Weight loss: °-She is taking Marinol 2.5 mg twice daily which is helping. °-She reports that she is eating better however she is still continue to lose weight. ° ° °  °Orders placed this encounter:  °Orders Placed This Encounter  °Procedures  °• Lactate dehydrogenase  °• Magnesium  °• CBC with Differential/Platelet  °• Comprehensive metabolic panel  ° ° ° ° °Randi Lockamy, FNP-C °[facility] °[phone] ° °

## 2020-02-27 NOTE — Patient Instructions (Signed)
[facility] Discharge Instructions for Patients Receiving Chemotherapy  Today you received the following chemotherapy agents   To help prevent nausea and vomiting after your treatment, we encourage you to take your nausea medication   If you develop nausea and vomiting that is not controlled by your nausea medication, call the clinic.   BELOW ARE SYMPTOMS THAT SHOULD BE REPORTED IMMEDIATELY:  *FEVER GREATER THAN 100.5 F  *CHILLS WITH OR WITHOUT FEVER  NAUSEA AND VOMITING THAT IS NOT CONTROLLED WITH YOUR NAUSEA MEDICATION  *UNUSUAL SHORTNESS OF BREATH  *UNUSUAL BRUISING OR BLEEDING  TENDERNESS IN MOUTH AND THROAT WITH OR WITHOUT PRESENCE OF ULCERS  *URINARY PROBLEMS  *BOWEL PROBLEMS  UNUSUAL RASH Items with * indicate a potential emergency and should be followed up as soon as possible.  Feel free to call the clinic should you have any questions or concerns. The clinic phone number is [phone].  Please show the CHEMO ALERT CARD at check-in to the Emergency Department and triage nurse.   

## 2020-02-27 NOTE — Progress Notes (Signed)
Patient presents today for follow up visit with RLockamy NP and treatment. Labs pending. Patient denies pain today. Patient denies any significant changes since her last visit. MAR reviewed and updated. Patient states, " The pill the doctor gave me does not work to help me eat." Marinol 2.5mg  prescribed twice per day before a meal. No weight loss today compared to her last visit. Patient teaching performed. Understanding verbalized.   Verbal order received from [facility] NP. Proceed with treatment. Labs reviewed by NP.   Treatment given today per MD orders. Tolerated infusion without adverse affects. Vital signs stable. No complaints at this time. Discharged from clinic via wheel chair. F/U with [facility] as scheduled.

## 2020-02-28 LAB — CANCER ANTIGEN 15-3: CA 15-3: 6.4 U/mL (ref 0.0–25.0)

## 2020-03-08 DIAGNOSIS — I10 Essential (primary) hypertension: Secondary | ICD-10-CM | POA: Diagnosis not present

## 2020-03-08 DIAGNOSIS — E785 Hyperlipidemia, unspecified: Secondary | ICD-10-CM | POA: Diagnosis not present

## 2020-03-19 ENCOUNTER — Inpatient Hospital Stay (HOSPITAL_COMMUNITY): Payer: Medicare Other

## 2020-03-19 ENCOUNTER — Inpatient Hospital Stay (HOSPITAL_BASED_OUTPATIENT_CLINIC_OR_DEPARTMENT_OTHER): Payer: Medicare Other | Admitting: Hematology

## 2020-03-19 VITALS — BP 140/53 | HR 58 | Temp 96.7°F | Resp 18

## 2020-03-19 VITALS — BP 118/49 | HR 56 | Temp 96.9°F | Resp 18 | Wt 113.1 lb

## 2020-03-19 DIAGNOSIS — I10 Essential (primary) hypertension: Secondary | ICD-10-CM | POA: Diagnosis not present

## 2020-03-19 DIAGNOSIS — Z79899 Other long term (current) drug therapy: Secondary | ICD-10-CM | POA: Diagnosis not present

## 2020-03-19 DIAGNOSIS — M7989 Other specified soft tissue disorders: Secondary | ICD-10-CM | POA: Diagnosis not present

## 2020-03-19 DIAGNOSIS — C50512 Malignant neoplasm of lower-outer quadrant of left female breast: Secondary | ICD-10-CM | POA: Diagnosis not present

## 2020-03-19 DIAGNOSIS — C7800 Secondary malignant neoplasm of unspecified lung: Secondary | ICD-10-CM | POA: Diagnosis not present

## 2020-03-19 DIAGNOSIS — R5383 Other fatigue: Secondary | ICD-10-CM | POA: Diagnosis not present

## 2020-03-19 DIAGNOSIS — G479 Sleep disorder, unspecified: Secondary | ICD-10-CM | POA: Diagnosis not present

## 2020-03-19 DIAGNOSIS — Z170 Estrogen receptor positive status [ER+]: Secondary | ICD-10-CM

## 2020-03-19 DIAGNOSIS — C50912 Malignant neoplasm of unspecified site of left female breast: Secondary | ICD-10-CM

## 2020-03-19 DIAGNOSIS — Z9221 Personal history of antineoplastic chemotherapy: Secondary | ICD-10-CM | POA: Diagnosis not present

## 2020-03-19 DIAGNOSIS — Z836 Family history of other diseases of the respiratory system: Secondary | ICD-10-CM | POA: Diagnosis not present

## 2020-03-19 DIAGNOSIS — C50812 Malignant neoplasm of overlapping sites of left female breast: Secondary | ICD-10-CM | POA: Diagnosis not present

## 2020-03-19 DIAGNOSIS — R590 Localized enlarged lymph nodes: Secondary | ICD-10-CM | POA: Diagnosis not present

## 2020-03-19 DIAGNOSIS — R110 Nausea: Secondary | ICD-10-CM | POA: Diagnosis not present

## 2020-03-19 DIAGNOSIS — Z8249 Family history of ischemic heart disease and other diseases of the circulatory system: Secondary | ICD-10-CM | POA: Diagnosis not present

## 2020-03-19 DIAGNOSIS — M25559 Pain in unspecified hip: Secondary | ICD-10-CM | POA: Diagnosis not present

## 2020-03-19 DIAGNOSIS — Z5112 Encounter for antineoplastic immunotherapy: Secondary | ICD-10-CM | POA: Diagnosis not present

## 2020-03-19 DIAGNOSIS — Z87442 Personal history of urinary calculi: Secondary | ICD-10-CM | POA: Diagnosis not present

## 2020-03-19 LAB — CBC WITH DIFFERENTIAL/PLATELET
Abs Immature Granulocytes: 0.01 10*3/uL (ref 0.00–0.07)
Basophils Absolute: 0 10*3/uL (ref 0.0–0.1)
Basophils Relative: 1 %
Eosinophils Absolute: 0.1 10*3/uL (ref 0.0–0.5)
Eosinophils Relative: 4 %
HCT: 32.6 % — ABNORMAL LOW (ref 36.0–46.0)
Hemoglobin: 10.4 g/dL — ABNORMAL LOW (ref 12.0–15.0)
Immature Granulocytes: 0 %
Lymphocytes Relative: 20 %
Lymphs Abs: 0.8 10*3/uL (ref 0.7–4.0)
MCH: 34 pg (ref 26.0–34.0)
MCHC: 31.9 g/dL (ref 30.0–36.0)
MCV: 106.5 fL — ABNORMAL HIGH (ref 80.0–100.0)
Monocytes Absolute: 0.4 10*3/uL (ref 0.1–1.0)
Monocytes Relative: 11 %
Neutro Abs: 2.5 10*3/uL (ref 1.7–7.7)
Neutrophils Relative %: 64 %
Platelets: 194 10*3/uL (ref 150–400)
RBC: 3.06 MIL/uL — ABNORMAL LOW (ref 3.87–5.11)
RDW: 14.7 % (ref 11.5–15.5)
WBC: 3.9 10*3/uL — ABNORMAL LOW (ref 4.0–10.5)
nRBC: 0 % (ref 0.0–0.2)

## 2020-03-19 LAB — COMPREHENSIVE METABOLIC PANEL
ALT: 19 U/L (ref 0–44)
AST: 24 U/L (ref 15–41)
Albumin: 3.2 g/dL — ABNORMAL LOW (ref 3.5–5.0)
Alkaline Phosphatase: 48 U/L (ref 38–126)
Anion gap: 8 (ref 5–15)
BUN: 22 mg/dL (ref 8–23)
CO2: 25 mmol/L (ref 22–32)
Calcium: 8.7 mg/dL — ABNORMAL LOW (ref 8.9–10.3)
Chloride: 107 mmol/L (ref 98–111)
Creatinine, Ser: 1.04 mg/dL — ABNORMAL HIGH (ref 0.44–1.00)
GFR calc Af Amer: 54 mL/min — ABNORMAL LOW (ref >60)
GFR calc non Af Amer: 46 mL/min — ABNORMAL LOW (ref >60)
Glucose, Bld: 143 mg/dL — ABNORMAL HIGH (ref 70–99)
Potassium: 4.4 mmol/L (ref 3.5–5.1)
Sodium: 140 mmol/L (ref 135–145)
Total Bilirubin: 0.3 mg/dL (ref 0.3–1.2)
Total Protein: 5.6 g/dL — ABNORMAL LOW (ref 6.5–8.1)

## 2020-03-19 LAB — LACTATE DEHYDROGENASE: LDH: 137 U/L (ref 98–192)

## 2020-03-19 LAB — MAGNESIUM: Magnesium: 2.1 mg/dL (ref 1.7–2.4)

## 2020-03-19 MED ORDER — SODIUM CHLORIDE 0.9 % IV SOLN
10.0000 mg | Freq: Once | INTRAVENOUS | Status: AC
  Administered 2020-03-19: 10 mg via INTRAVENOUS
  Filled 2020-03-19: qty 10

## 2020-03-19 MED ORDER — DIPHENHYDRAMINE HCL 25 MG PO CAPS
25.0000 mg | ORAL_CAPSULE | Freq: Once | ORAL | Status: AC
  Administered 2020-03-19: 25 mg via ORAL
  Filled 2020-03-19: qty 1

## 2020-03-19 MED ORDER — SODIUM CHLORIDE 0.9% FLUSH
10.0000 mL | INTRAVENOUS | Status: DC | PRN
  Administered 2020-03-19: 10 mL via INTRAVENOUS

## 2020-03-19 MED ORDER — FAM-TRASTUZUMAB DERUXTECAN-NXKI CHEMO 100 MG IV SOLR
3.2000 mg/kg | Freq: Once | INTRAVENOUS | Status: AC
  Administered 2020-03-19: 168 mg via INTRAVENOUS
  Filled 2020-03-19: qty 8.4

## 2020-03-19 MED ORDER — PALONOSETRON HCL INJECTION 0.25 MG/5ML
0.2500 mg | Freq: Once | INTRAVENOUS | Status: AC
  Administered 2020-03-19: 0.25 mg via INTRAVENOUS
  Filled 2020-03-19: qty 5

## 2020-03-19 MED ORDER — SODIUM CHLORIDE 0.9% FLUSH: 10.0000 mL | INTRAVENOUS | Status: DC | PRN

## 2020-03-19 MED ORDER — DEXTROSE 5 % IV SOLN: Freq: Once | INTRAVENOUS | Status: AC

## 2020-03-19 MED ORDER — HEPARIN SOD (PORK) LOCK FLUSH 100 UNIT/ML IV SOLN
500.0000 [IU] | Freq: Once | INTRAVENOUS | Status: AC | PRN
  Administered 2020-03-19: 500 [IU]

## 2020-03-19 MED ORDER — ACETAMINOPHEN 325 MG PO TABS
650.0000 mg | ORAL_TABLET | Freq: Once | ORAL | Status: AC
  Administered 2020-03-19: 650 mg via ORAL
  Filled 2020-03-19: qty 2

## 2020-03-19 NOTE — Patient Instructions (Signed)
[facility] Discharge Instructions for Patients Receiving Chemotherapy  Today you received the following chemotherapy agents   To help prevent nausea and vomiting after your treatment, we encourage you to take your nausea medication   If you develop nausea and vomiting that is not controlled by your nausea medication, call the clinic.   BELOW ARE SYMPTOMS THAT SHOULD BE REPORTED IMMEDIATELY:  *FEVER GREATER THAN 100.5 F  *CHILLS WITH OR WITHOUT FEVER  NAUSEA AND VOMITING THAT IS NOT CONTROLLED WITH YOUR NAUSEA MEDICATION  *UNUSUAL SHORTNESS OF BREATH  *UNUSUAL BRUISING OR BLEEDING  TENDERNESS IN MOUTH AND THROAT WITH OR WITHOUT PRESENCE OF ULCERS  *URINARY PROBLEMS  *BOWEL PROBLEMS  UNUSUAL RASH Items with * indicate a potential emergency and should be followed up as soon as possible.  Feel free to call the clinic should you have any questions or concerns. The clinic phone number is [phone].  Please show the CHEMO ALERT CARD at check-in to the Emergency Department and triage nurse.   

## 2020-03-19 NOTE — Patient Instructions (Addendum)
[place] at [facility] Discharge Instructions  You were seen today by Dr. Delton Coombes. He went over your recent results. You received your treatment today. You will be scheduled for a CT scan of your chest and abdomen. Dr. Delton Coombes will see you back in 3 weeks for labs and follow up.   Thank you for choosing [place] at [facility] to provide your oncology and hematology care.  To afford each patient quality time with our provider, please arrive at least 15 minutes before your scheduled appointment time.   If you have a lab appointment with the [facility] please come in thru the Main Entrance and check in at the main information desk  You need to re-schedule your appointment should you arrive 10 or more minutes late.  We strive to give you quality time with our providers, and arriving late affects you and other patients whose appointments are after yours.  Also, if you no show three or more times for appointments you may be dismissed from the clinic at the providers discretion.     Again, thank you for choosing [facility].  Our hope is that these requests will decrease the amount of time that you wait before being seen by our physicians.       _____________________________________________________________  Should you have questions after your visit to [facility], please contact our office at [phone] between the hours of 8:00 a.m. and 4:30 p.m.  Voicemails left after 4:00 p.m. will not be returned until the following business day.  For prescription refill requests, have your pharmacy contact our office and allow 72 hours.    Cancer Center Support Programs:   > Cancer Support Group  2nd Tuesday of the month 1pm-2pm, Journey Room

## 2020-03-19 NOTE — Progress Notes (Signed)
Patient presents today for treatment and follow up visit with Dr. Delton Coombes. Labs reviewed. Message received to proceed with treatment. Vital signs stable.   Treatment given today per MD orders. Tolerated infusion without adverse affects. Vital signs stable. No complaints at this time. Discharged from clinic via wheel chair. F/U with [facility] as scheduled.

## 2020-03-19 NOTE — Progress Notes (Signed)
Carol Duarte, Carol Duarte   CLINIC:  Medical Oncology/Hematology  PCP:  [name], MD [place] / [place] [address] [phone]   REASON FOR VISIT:  Follow-up for invasive ductal carcinoma of the left breast  PRIOR THERAPY: Trastuzumab x 10 cycles, pertuzumab x 6 cycles, emtansine x 8 cycles  NGS Results: Not done  CURRENT THERAPY: Enhertu & Aloxi  BRIEF ONCOLOGIC HISTORY:  Oncology History  Invasive ductal carcinoma of left breast ([place])  03/30/2017 Initial Diagnosis   Invasive ductal carcinoma of left breast ([place])   08/09/2018 - 03/20/2019 Chemotherapy   The patient had trastuzumab (HERCEPTIN) 500 mg in sodium chloride 0.9 % 250 mL chemo infusion, 525 mg, Intravenous,  Once, 10 of 10 cycles Administration: 500 mg (08/09/2018), 350 mg (09/06/2018), 350 mg (09/27/2018), 378 mg (10/18/2018), 378 mg (11/15/2018), 378 mg (12/06/2018), 378 mg (12/28/2018), 378 mg (01/18/2019), 378 mg (02/08/2019) pertuzumab (PERJETA) 840 mg in sodium chloride 0.9 % 250 mL chemo infusion, 840 mg (100 % of original dose 840 mg), Intravenous, Once, 6 of 6 cycles Dose modification: 840 mg (original dose 840 mg, Cycle 5), 420 mg (original dose 420 mg, Cycle 6) Administration: 840 mg (11/15/2018), 420 mg (12/06/2018), 420 mg (12/28/2018), 420 mg (01/18/2019), 420 mg (02/08/2019)  for chemotherapy treatment.    09/18/2018 Genetic Testing   Negative genetic testing for the breast and gyn cancer panel.  The Breast/GYN gene panel offered by [facility] includes sequencing and rearrangement analysis for the following 23 genes:  ATM, BRCA1, BRCA2, BRIP1, CDH1, CHEK2, EPCAM, MLH1, MSH2, MSH6, NBN, NF1, PALB2, PMS2, PTEN, RAD51C, RAD51D, STK11, and TP53.   The report date is 09/18/2018.   03/12/2019 - 09/09/2019 Chemotherapy   The patient had ado-trastuzumab emtansine (KADCYLA) 140 mg in sodium chloride 0.9 % 250 mL chemo infusion, 2.4 mg/kg = 140 mg (100 % of original dose 2.4  mg/kg), Intravenous, Once, 8 of 9 cycles Dose modification: 2.4 mg/kg (original dose 2.4 mg/kg, Cycle 1, Reason: Patient Age), 3 mg/kg (original dose 2.4 mg/kg, Cycle 3, Reason: Provider Judgment) Administration: 140 mg (03/12/2019), 180 mg (04/23/2019), 180 mg (06/18/2019), 180 mg (04/02/2019), 180 mg (05/28/2019), 180 mg (07/09/2019), 180 mg (07/30/2019), 180 mg (08/20/2019)  for chemotherapy treatment.    10/22/2019 -  Chemotherapy   The patient had palonosetron (ALOXI) injection 0.25 mg, 0.25 mg, Intravenous,  Once, 7 of 10 cycles Administration: 0.25 mg (10/22/2019), 0.25 mg (11/12/2019), 0.25 mg (01/14/2020), 0.25 mg (02/04/2020), 0.25 mg (12/03/2019), 0.25 mg (12/24/2019), 0.25 mg (02/27/2020) fam-trastuzumab deruxtecan-nxki (ENHERTU) 168 mg in dextrose 5 % 100 mL chemo infusion, 3.2 mg/kg = 168 mg (100 % of original dose 3.2 mg/kg), Intravenous,  Once, 7 of 10 cycles Dose modification: 3.2 mg/kg (original dose 3.2 mg/kg, Cycle 1, Reason: Provider Judgment), 3.2 mg/kg (original dose 3.2 mg/kg, Cycle 2, Reason: Patient Age), 3.2 mg/kg (original dose 3.2 mg/kg, Cycle 5, Reason: Patient Age), 3.2 mg/kg (original dose 3.2 mg/kg, Cycle 3, Reason: Provider Judgment), 3.2 mg/kg (original dose 3.2 mg/kg, Cycle 4, Reason: Provider Judgment) Administration: 168 mg (10/22/2019), 168 mg (11/12/2019), 168 mg (01/14/2020), 168 mg (02/04/2020), 168 mg (12/03/2019), 168 mg (12/24/2019), 168 mg (02/27/2020)  for chemotherapy treatment.      CANCER STAGING: Cancer Staging No matching staging information was found for the patient.  INTERVAL HISTORY:  [name], a 86 y.o. female, returns for routine follow-up and consideration for next cycle of chemotherapy. Carol Duarte was last seen on 02/04/2020.  Due for cycle #8 of trastuzumab today.   Today she complains having swelling in her legs for the past 3-4 days and she assumes that it is due to too much salt in her cooking; she denies having any pain in her legs. She is trying to  cook more and eat more protein; her appetite is excellent.  She is not driving anymore due to hip pain and arthritis and is requesting a disabled parking placard. She got her second COVID vaccine in 10/14/2019.  Overall, she feels ready for next cycle of chemo today.    REVIEW OF SYSTEMS:  Review of Systems  Constitutional: Positive for appetite change (moderately decreased) and fatigue (moderate).  Cardiovascular: Positive for leg swelling.  Psychiatric/Behavioral: Positive for sleep disturbance.  All other systems reviewed and are negative.   PAST MEDICAL/SURGICAL HISTORY:  Past Medical History:  Diagnosis Date  . Cancer ([place])    left breast  . History of kidney stones   . Hypercholesteremia   . Hypertension    Past Surgical History:  Procedure Laterality Date  . APPENDECTOMY    . brain cyst removed  2011  . BREAST BIOPSY Left 03/28/2018   Procedure: BREAST BIOPSY;  Surgeon: [name], MD;  Location: [facility];  Service: General;  Laterality: Left;  . CATARACT EXTRACTION W/PHACO Left 11/09/2015   Procedure: CATARACT EXTRACTION PHACO AND INTRAOCULAR LENS PLACEMENT LEFT EYE cde=8.97;  Surgeon: [name], MD;  Location: [facility];  Service: Ophthalmology;  Laterality: Left;  . CATARACT EXTRACTION W/PHACO Right 02/04/2019   Procedure: CATARACT EXTRACTION PHACO AND INTRAOCULAR LENS PLACEMENT (IOC);  Surgeon: [name], MD;  Location: [facility];  Service: Ophthalmology;  Laterality: Right;  CDE: 15.19  . EYE SURGERY     KPE left  . MASTECTOMY MODIFIED RADICAL Left 05/28/2018   Procedure: LEFT MODIFIED RADICAL MASTECTOMY;  Surgeon: [name], MD;  Location: [facility];  Service: General;  Laterality: Left;  Marland Kitchen MASTECTOMY, PARTIAL Left 04/24/2017   Procedure: MASTECTOMY PARTIAL;  Surgeon: [name], MD;  Location: [facility];  Service: General;  Laterality: Left;  . ORIF FEMUR FRACTURE Left 09/03/2019   Procedure: OPEN REDUCTION INTERNAL FIXATION (ORIF) LEFT HIP FEMUR FRACTURE;  Surgeon:  Paralee Cancel, MD;  Location: [facility];  Service: Orthopedics;  Laterality: Left;  . PORTACATH PLACEMENT Right 08/08/2018   Procedure: INSERTION PORT-A-CATH;  Surgeon: [name], MD;  Location: [facility];  Service: General;  Laterality: Right;  . TONSILLECTOMY    . TONSILLECTOMY AND ADENOIDECTOMY      SOCIAL HISTORY:  Social History   Socioeconomic History  . Marital status: Widowed    Spouse name: Not on file  . Number of children: Not on file  . Years of education: Not on file  . Highest education level: Not on file  Occupational History  . Not on file  Tobacco Use  . Smoking status: Never Smoker  . Smokeless tobacco: Never Used  Vaping Use  . Vaping Use: Never used  Substance and Sexual Activity  . Alcohol use: No  . Drug use: No  . Sexual activity: Not Currently    Partners: Male  Other Topics Concern  . Not on file  Social History Narrative  . Not on file   Social Determinants of Health   Financial Resource Strain:   . Difficulty of Paying Living Expenses:   Food Insecurity:   . Worried About Charity fundraiser in the Last Year:   . [place] in the  Last Year:   Transportation Needs:   . Film/video editor (Medical):   Marland Kitchen Lack of Transportation (Non-Medical):   Physical Activity:   . Days of Exercise per Week:   . Minutes of Exercise per Session:   Stress:   . Feeling of Stress :   Social Connections:   . Frequency of Communication with Friends and Family:   . Frequency of Social Gatherings with Friends and Family:   . Attends Religious Services:   . Active Member of Clubs or Organizations:   . Attends Archivist Meetings:   Marland Kitchen Marital Status:   Intimate Partner Violence:   . Fear of Current or Ex-Partner:   . Emotionally Abused:   Marland Kitchen Physically Abused:   . Sexually Abused:     FAMILY HISTORY:  Family History  Problem Relation Age of Onset  . Heart failure Mother   . Heart failure Father   . Congestive Heart Failure Brother   .  Tuberculosis Paternal Uncle   . Tuberculosis Maternal Grandmother   . Tuberculosis Maternal Grandfather   . Congestive Heart Failure Brother     CURRENT MEDICATIONS:  Current Outpatient Medications  Medication Sig Dispense Refill  . Ado-Trastuzumab Emtansine (KADCYLA IV) Inject into the vein every 21 ( twenty-one) days.    Marland Kitchen amLODipine (NORVASC) 5 MG tablet Take 5 mg by mouth daily.    Marland Kitchen dronabinol (MARINOL) 2.5 MG capsule Take 1 capsule (2.5 mg total) by mouth 2 (two) times daily before a meal. 60 capsule 1  . ferrous sulfate 325 (65 FE) MG tablet Take 325 mg by mouth daily with breakfast.    . labetalol (NORMODYNE) 100 MG tablet Take 1 tablet (100 mg total) by mouth 2 (two) times daily. 60 tablet 12  . oxybutynin (DITROPAN) 5 MG tablet Take 5 mg by mouth daily.     Marland Kitchen PREVIDENT 5000 BOOSTER PLUS 1.1 % PSTE APPLY WITH TOOTHBRUSH AT BEDTIME AS DIRECTED.    Marland Kitchen simvastatin (ZOCOR) 40 MG tablet Take 40 mg by mouth daily.    . temazepam (RESTORIL) 30 MG capsule Take 1 capsule (30 mg total) by mouth at bedtime as needed for sleep. May increase dose to 2 capsules if 1 capsule does not help. 90 capsule 1   No current facility-administered medications for this visit.   Facility-Administered Medications Ordered in Other Visits  Medication Dose Route Frequency Provider Last Rate Last Admin  . sodium chloride flush (NS) 0.9 % injection 10 mL  10 mL Intravenous PRN Derek Jack, MD   10 mL at 03/19/20 1254    ALLERGIES:  No Known Allergies  PHYSICAL EXAM:  Performance status (ECOG): 2 - Symptomatic, <50% confined to bed  Vitals:   03/19/20 1240  BP: (!) 118/49  Pulse: 56  Resp: 18  Temp: (!) 96.9 F (36.1 C)  SpO2: 99%   Wt Readings from Last 3 Encounters:  03/19/20 113 lb 1.5 oz (51.3 kg)  02/27/20 104 lb 9.6 oz (47.4 kg)  02/04/20 106 lb 12.8 oz (48.4 kg)   Physical Exam Vitals reviewed.  Constitutional:      Appearance: Normal appearance.  Cardiovascular:     Rate and  Rhythm: Normal rate and regular rhythm.     Pulses: Normal pulses.     Heart sounds: Normal heart sounds.  Pulmonary:     Effort: Pulmonary effort is normal.     Breath sounds: Normal breath sounds.  Chest:     Comments: Port-a-Cath on R chest Musculoskeletal:  General: No tenderness.     Right lower leg: Edema (1+ at ankle level) present.     Left lower leg: Edema (1+ at ankle level) present.  Neurological:     General: No focal deficit present.     Mental Status: She is alert and oriented to person, place, and time.  Psychiatric:        Mood and Affect: Mood normal.        Behavior: Behavior normal.     LABORATORY DATA:  I have reviewed the labs as listed.  CBC Latest Ref Rng & Units 03/19/2020 02/27/2020 02/04/2020  WBC 4.0 - 10.5 K/uL 3.9(L) 4.4 4.6  Hemoglobin 12.0 - 15.0 g/dL 10.4(L) 10.9(L) 11.3(L)  Hematocrit 36 - 46 % 32.6(L) 34.3(L) 34.9(L)  Platelets 150 - 400 K/uL 194 228 162   CMP Latest Ref Rng & Units 02/27/2020 02/04/2020 01/14/2020  Glucose 70 - 99 mg/dL 124(H) 119(H) 149(H)  BUN 8 - 23 mg/dL 30(H) 27(H) 32(H)  Creatinine 0.44 - 1.00 mg/dL 0.93 0.93 0.90  Sodium 135 - 145 mmol/L 140 139 140  Potassium 3.5 - 5.1 mmol/L 3.9 4.2 4.1  Chloride 98 - 111 mmol/L 106 109 105  CO2 22 - 32 mmol/L '25 22 25  ' Calcium 8.9 - 10.3 mg/dL 9.3 9.0 9.1  Total Protein 6.5 - 8.1 g/dL 5.9(L) 5.9(L) 5.8(L)  Total Bilirubin 0.3 - 1.2 mg/dL 0.7 0.5 0.6  Alkaline Phos 38 - 126 U/L 53 56 59  AST 15 - 41 U/L '23 23 20  ' ALT 0 - 44 U/L '18 25 23   ' Lab Results  Component Value Date   LDH 137 03/19/2020   LDH 132 02/27/2020   LDH 143 01/14/2020     DIAGNOSTIC IMAGING:  I have independently reviewed the scans and discussed with the patient. ECHOCARDIOGRAM COMPLETE  Result Date: 02/24/2020    ECHOCARDIOGRAM REPORT   Patient Name:   Margretta APPLE Friedly Date of Exam: 02/24/2020 Medical Rec #:  [id number]        Height:       61.0 in Accession #:    [id number]       Weight:       106.8 lb  Date of Birth:  12-27-1924         BSA:          1.447 m Patient Age:    46 years         BP:           118/65 mmHg Patient Gender: F                HR:           53 bpm. Exam Location:  [facility] Procedure: 2D Echo, Cardiac Doppler and Color Doppler Indications:    Chemo V67.2 / Z09, C50.912 (ICD-10-CM) - Invasive ductal                 carcinoma of left breast  History:        Patient has prior history of Echocardiogram examinations, most                 recent 11/12/2019. Risk Factors:Hypertension and Dyslipidemia.                 Malignant neoplasm of lower-outer quadrant of left female                 breast- S/P left mastectomy.  Sonographer:    Alvino Chapel RCS  Referring Phys: [place]  1. Left ventricular ejection fraction, by estimation, is 60 to 65%. The left ventricle has normal function. The left ventricle has no regional Babin motion abnormalities. Left ventricular diastolic parameters are indeterminate. Elevated left atrial pressure.  2. Right ventricular systolic function is normal. The right ventricular size is normal. There is normal pulmonary artery systolic pressure.  3. Left atrial size was moderately dilated.  4. The mitral valve is normal in structure. Trivial mitral valve regurgitation. No evidence of mitral stenosis.  5. The aortic valve is tricuspid. Aortic valve regurgitation is not visualized. No aortic stenosis is present.  6. The inferior vena cava is normal in size with greater than 50% respiratory variability, suggesting right atrial pressure of 3 mmHg. FINDINGS  Left Ventricle: Left ventricular ejection fraction, by estimation, is 60 to 65%. The left ventricle has normal function. The left ventricle has no regional Villamizar motion abnormalities. The left ventricular internal cavity size was normal in size. There is  no left ventricular hypertrophy. Left ventricular diastolic parameters are indeterminate. Elevated left atrial pressure. Right Ventricle: The  right ventricular size is normal. No increase in right ventricular Molinari thickness. Right ventricular systolic function is normal. There is normal pulmonary artery systolic pressure. The tricuspid regurgitant velocity is 2.51 m/s, and  with an assumed right atrial pressure of 3 mmHg, the estimated right ventricular systolic pressure is 95.1 mmHg. Left Atrium: Left atrial size was moderately dilated. Right Atrium: Right atrial size was normal in size. Pericardium: There is no evidence of pericardial effusion. Mitral Valve: The mitral valve is normal in structure. Trivial mitral valve regurgitation. No evidence of mitral valve stenosis. Tricuspid Valve: The tricuspid valve is normal in structure. Tricuspid valve regurgitation is mild . No evidence of tricuspid stenosis. Aortic Valve: The aortic valve is tricuspid. . There is mild thickening and mild calcification of the aortic valve. Aortic valve regurgitation is not visualized. No aortic stenosis is present. Mild aortic valve annular calcification. There is mild thickening of the aortic valve. There is mild calcification of the aortic valve. Aortic valve mean gradient measures 5.9 mmHg. Aortic valve peak gradient measures 12.3 mmHg. Aortic valve area, by VTI measures 2.66 cm. Pulmonic Valve: The pulmonic valve was not well visualized. Pulmonic valve regurgitation is mild. No evidence of pulmonic stenosis. Aorta: The aortic root is normal in size and structure. Venous: The inferior vena cava is normal in size with greater than 50% respiratory variability, suggesting right atrial pressure of 3 mmHg. IAS/Shunts: The interatrial septum was not well visualized.  LEFT VENTRICLE PLAX 2D LVIDd:         4.27 cm     Diastology LVIDs:         2.00 cm     LV e' lateral:   6.85 cm/s LV PW:         0.88 cm     LV E/e' lateral: 17.7 LV IVS:        0.89 cm     LV e' medial:    6.53 cm/s LVOT diam:     1.80 cm     LV E/e' medial:  18.5 LV SV:         111 LV SV Index:   77 LVOT Area:      2.54 cm  LV Volumes (MOD) LV vol d, MOD A2C: 52.4 ml LV vol d, MOD A4C: 55.8 ml LV vol s, MOD A2C: 16.4 ml LV vol s, MOD A4C: 14.8 ml  LV SV MOD A2C:     36.0 ml LV SV MOD A4C:     55.8 ml LV SV MOD BP:      39.6 ml RIGHT VENTRICLE RV S prime:     15.60 cm/s TAPSE (M-mode): 2.4 cm LEFT ATRIUM             Index       RIGHT ATRIUM           Index LA diam:        3.00 cm 2.07 cm/m  RA Area:     13.60 cm LA Vol (A2C):   54.5 ml 37.67 ml/m RA Volume:   27.30 ml  18.87 ml/m LA Vol (A4C):   48.8 ml 33.73 ml/m LA Biplane Vol: 52.6 ml 36.36 ml/m  AORTIC VALVE AV Area (Vmax):    2.47 cm AV Area (Vmean):   2.19 cm AV Area (VTI):     2.66 cm AV Vmax:           175.10 cm/s AV Vmean:          113.536 cm/s AV VTI:            0.416 m AV Peak Grad:      12.3 mmHg AV Mean Grad:      5.9 mmHg LVOT Vmax:         170.00 cm/s LVOT Vmean:        97.600 cm/s LVOT VTI:          0.435 m LVOT/AV VTI ratio: 1.05  AORTA Ao Root diam: 3.00 cm MITRAL VALVE                TRICUSPID VALVE MV Area (PHT): 2.80 cm     TR Peak grad:   25.2 mmHg MV Decel Time: 271 msec     TR Vmax:        251.00 cm/s MV E velocity: 121.00 cm/s MV A velocity: 118.00 cm/s  SHUNTS MV E/A ratio:  1.03         Systemic VTI:  0.44 m                             Systemic Diam: 1.80 cm Carlyle Dolly MD Electronically signed by Carlyle Dolly MD Signature Date/Time: 02/24/2020/12:45:27 PM    Final      ASSESSMENT:  1. Metastatic HER-2 positive left breast cancer: -Enhertu started on 10/22/2019. -CT CAP on 01/10/2020 showed response to therapy with decreasing lung nodules as well as supraclavicular and mediastinal adenopathy with no new evidence of metastatic disease. -CA 15-3 of 6.4 on 02/27/2020.  2.  High risk drug monitoring: -Echo on 11/12/2019, EF 60-65%. -Echo on 02/24/2020 with EF 60-65%.   PLAN:  1. Metastatic HER-2 positive left breast cancer: -Reviewed her labs from today.  LFTs are grossly within normal limits.  Albumin is slightly low at  3.2.  White count is adequate to proceed with next cycle of TDXT. -She will come back in 3 weeks for follow-up.  I plan to repeat CT CAP prior to next visit along with tumor marker.  2. High risk drug monitoring: -I reviewed echo report from 02/24/2020, which shows preserved ejection fraction.  3.  Leg swellings: -She reported ankle swellings for the past 3 to 4 days.  She is reportedly up on her feet and cooking at home. -Albumin is low at 3.2.  Encouraged high-protein diet.  If no improvement will  consider Lasix.  4. Difficulty sleeping: -Continue mirtazapine 30 mg.  5. Weight loss: -Appetite picked up.  She is gaining weight.   Orders placed this encounter:  No orders of the defined types were placed in this encounter.    Derek Jack, MD [place] [phone]   I, Milinda Antis, am acting as a scribe for Dr. Sanda Linger.  I, Derek Jack MD, have reviewed the above documentation for accuracy and completeness, and I agree with the above.

## 2020-04-08 ENCOUNTER — Other Ambulatory Visit: Payer: Self-pay

## 2020-04-08 ENCOUNTER — Ambulatory Visit (HOSPITAL_COMMUNITY)
Admission: RE | Admit: 2020-04-08 | Discharge: 2020-04-08 | Disposition: A | Discharge status: Home / Self Care | Payer: Medicare Other | Source: Ambulatory Visit | Attending: Hematology | Admitting: Hematology

## 2020-04-08 DIAGNOSIS — C7802 Secondary malignant neoplasm of left lung: Secondary | ICD-10-CM | POA: Diagnosis not present

## 2020-04-08 DIAGNOSIS — C50512 Malignant neoplasm of lower-outer quadrant of left female breast: Secondary | ICD-10-CM | POA: Diagnosis not present

## 2020-04-08 DIAGNOSIS — Z170 Estrogen receptor positive status [ER+]: Secondary | ICD-10-CM | POA: Insufficient documentation

## 2020-04-08 DIAGNOSIS — C50912 Malignant neoplasm of unspecified site of left female breast: Secondary | ICD-10-CM | POA: Diagnosis not present

## 2020-04-08 DIAGNOSIS — I10 Essential (primary) hypertension: Secondary | ICD-10-CM | POA: Diagnosis not present

## 2020-04-08 DIAGNOSIS — C7801 Secondary malignant neoplasm of right lung: Secondary | ICD-10-CM | POA: Diagnosis not present

## 2020-04-08 DIAGNOSIS — E785 Hyperlipidemia, unspecified: Secondary | ICD-10-CM | POA: Diagnosis not present

## 2020-04-08 MED ORDER — IOHEXOL 300 MG/ML  SOLN
75.0000 mL | Freq: Once | INTRAMUSCULAR | Status: AC | PRN
  Administered 2020-04-08: 70 mL via INTRAVENOUS

## 2020-04-09 ENCOUNTER — Inpatient Hospital Stay (HOSPITAL_COMMUNITY): Payer: Medicare Other

## 2020-04-09 ENCOUNTER — Inpatient Hospital Stay (HOSPITAL_COMMUNITY): Payer: Medicare Other | Attending: Hematology

## 2020-04-09 ENCOUNTER — Inpatient Hospital Stay (HOSPITAL_BASED_OUTPATIENT_CLINIC_OR_DEPARTMENT_OTHER): Payer: Medicare Other | Admitting: Hematology

## 2020-04-09 VITALS — BP 149/56 | HR 81 | Temp 96.9°F | Resp 18

## 2020-04-09 VITALS — BP 119/55 | HR 54 | Temp 96.9°F | Resp 18 | Wt 111.5 lb

## 2020-04-09 DIAGNOSIS — Z5112 Encounter for antineoplastic immunotherapy: Secondary | ICD-10-CM | POA: Insufficient documentation

## 2020-04-09 DIAGNOSIS — M47816 Spondylosis without myelopathy or radiculopathy, lumbar region: Secondary | ICD-10-CM | POA: Diagnosis not present

## 2020-04-09 DIAGNOSIS — Z9221 Personal history of antineoplastic chemotherapy: Secondary | ICD-10-CM | POA: Insufficient documentation

## 2020-04-09 DIAGNOSIS — R159 Full incontinence of feces: Secondary | ICD-10-CM | POA: Diagnosis not present

## 2020-04-09 DIAGNOSIS — C7802 Secondary malignant neoplasm of left lung: Secondary | ICD-10-CM | POA: Diagnosis not present

## 2020-04-09 DIAGNOSIS — I700 Atherosclerosis of aorta: Secondary | ICD-10-CM | POA: Insufficient documentation

## 2020-04-09 DIAGNOSIS — R634 Abnormal weight loss: Secondary | ICD-10-CM | POA: Diagnosis not present

## 2020-04-09 DIAGNOSIS — I10 Essential (primary) hypertension: Secondary | ICD-10-CM | POA: Insufficient documentation

## 2020-04-09 DIAGNOSIS — I2510 Atherosclerotic heart disease of native coronary artery without angina pectoris: Secondary | ICD-10-CM | POA: Insufficient documentation

## 2020-04-09 DIAGNOSIS — C50912 Malignant neoplasm of unspecified site of left female breast: Secondary | ICD-10-CM

## 2020-04-09 DIAGNOSIS — G479 Sleep disorder, unspecified: Secondary | ICD-10-CM | POA: Diagnosis not present

## 2020-04-09 DIAGNOSIS — K7689 Other specified diseases of liver: Secondary | ICD-10-CM | POA: Insufficient documentation

## 2020-04-09 DIAGNOSIS — J90 Pleural effusion, not elsewhere classified: Secondary | ICD-10-CM | POA: Diagnosis not present

## 2020-04-09 DIAGNOSIS — Z836 Family history of other diseases of the respiratory system: Secondary | ICD-10-CM | POA: Insufficient documentation

## 2020-04-09 DIAGNOSIS — C50812 Malignant neoplasm of overlapping sites of left female breast: Secondary | ICD-10-CM | POA: Diagnosis not present

## 2020-04-09 DIAGNOSIS — M7989 Other specified soft tissue disorders: Secondary | ICD-10-CM | POA: Diagnosis not present

## 2020-04-09 DIAGNOSIS — Z79899 Other long term (current) drug therapy: Secondary | ICD-10-CM | POA: Diagnosis not present

## 2020-04-09 DIAGNOSIS — R590 Localized enlarged lymph nodes: Secondary | ICD-10-CM | POA: Diagnosis not present

## 2020-04-09 DIAGNOSIS — Z9012 Acquired absence of left breast and nipple: Secondary | ICD-10-CM | POA: Diagnosis not present

## 2020-04-09 DIAGNOSIS — K449 Diaphragmatic hernia without obstruction or gangrene: Secondary | ICD-10-CM | POA: Diagnosis not present

## 2020-04-09 DIAGNOSIS — R911 Solitary pulmonary nodule: Secondary | ICD-10-CM | POA: Diagnosis not present

## 2020-04-09 DIAGNOSIS — R197 Diarrhea, unspecified: Secondary | ICD-10-CM | POA: Diagnosis not present

## 2020-04-09 DIAGNOSIS — N281 Cyst of kidney, acquired: Secondary | ICD-10-CM | POA: Insufficient documentation

## 2020-04-09 DIAGNOSIS — C7801 Secondary malignant neoplasm of right lung: Secondary | ICD-10-CM | POA: Diagnosis not present

## 2020-04-09 DIAGNOSIS — Z87442 Personal history of urinary calculi: Secondary | ICD-10-CM | POA: Insufficient documentation

## 2020-04-09 DIAGNOSIS — Z8249 Family history of ischemic heart disease and other diseases of the circulatory system: Secondary | ICD-10-CM | POA: Insufficient documentation

## 2020-04-09 DIAGNOSIS — E042 Nontoxic multinodular goiter: Secondary | ICD-10-CM | POA: Diagnosis not present

## 2020-04-09 DIAGNOSIS — Z9049 Acquired absence of other specified parts of digestive tract: Secondary | ICD-10-CM | POA: Insufficient documentation

## 2020-04-09 LAB — COMPREHENSIVE METABOLIC PANEL
ALT: 18 U/L (ref 0–44)
AST: 23 U/L (ref 15–41)
Albumin: 3.2 g/dL — ABNORMAL LOW (ref 3.5–5.0)
Alkaline Phosphatase: 46 U/L (ref 38–126)
Anion gap: 11 (ref 5–15)
BUN: 22 mg/dL (ref 8–23)
CO2: 23 mmol/L (ref 22–32)
Calcium: 9 mg/dL (ref 8.9–10.3)
Chloride: 106 mmol/L (ref 98–111)
Creatinine, Ser: 1.23 mg/dL — ABNORMAL HIGH (ref 0.44–1.00)
GFR calc Af Amer: 44 mL/min — ABNORMAL LOW (ref >60)
GFR calc non Af Amer: 38 mL/min — ABNORMAL LOW (ref >60)
Glucose, Bld: 131 mg/dL — ABNORMAL HIGH (ref 70–99)
Potassium: 3.8 mmol/L (ref 3.5–5.1)
Sodium: 140 mmol/L (ref 135–145)
Total Bilirubin: 0.6 mg/dL (ref 0.3–1.2)
Total Protein: 5.7 g/dL — ABNORMAL LOW (ref 6.5–8.1)

## 2020-04-09 LAB — CBC WITH DIFFERENTIAL/PLATELET
Abs Immature Granulocytes: 0.01 10*3/uL (ref 0.00–0.07)
Basophils Absolute: 0 10*3/uL (ref 0.0–0.1)
Basophils Relative: 1 %
Eosinophils Absolute: 0.1 10*3/uL (ref 0.0–0.5)
Eosinophils Relative: 3 %
HCT: 33.6 % — ABNORMAL LOW (ref 36.0–46.0)
Hemoglobin: 10.7 g/dL — ABNORMAL LOW (ref 12.0–15.0)
Immature Granulocytes: 0 %
Lymphocytes Relative: 20 %
Lymphs Abs: 0.9 10*3/uL (ref 0.7–4.0)
MCH: 33.6 pg (ref 26.0–34.0)
MCHC: 31.8 g/dL (ref 30.0–36.0)
MCV: 105.7 fL — ABNORMAL HIGH (ref 80.0–100.0)
Monocytes Absolute: 0.4 10*3/uL (ref 0.1–1.0)
Monocytes Relative: 9 %
Neutro Abs: 2.9 10*3/uL (ref 1.7–7.7)
Neutrophils Relative %: 67 %
Platelets: 192 10*3/uL (ref 150–400)
RBC: 3.18 MIL/uL — ABNORMAL LOW (ref 3.87–5.11)
RDW: 14.1 % (ref 11.5–15.5)
WBC: 4.3 10*3/uL (ref 4.0–10.5)
nRBC: 0 % (ref 0.0–0.2)

## 2020-04-09 LAB — MAGNESIUM: Magnesium: 2 mg/dL (ref 1.7–2.4)

## 2020-04-09 MED ORDER — ACETAMINOPHEN 325 MG PO TABS
650.0000 mg | ORAL_TABLET | Freq: Once | ORAL | Status: AC
  Administered 2020-04-09: 650 mg via ORAL
  Filled 2020-04-09: qty 2

## 2020-04-09 MED ORDER — DIPHENHYDRAMINE HCL 25 MG PO CAPS
25.0000 mg | ORAL_CAPSULE | Freq: Once | ORAL | Status: AC
  Administered 2020-04-09: 25 mg via ORAL
  Filled 2020-04-09: qty 1

## 2020-04-09 MED ORDER — HEPARIN SOD (PORK) LOCK FLUSH 100 UNIT/ML IV SOLN
500.0000 [IU] | Freq: Once | INTRAVENOUS | Status: AC | PRN
  Administered 2020-04-09: 500 [IU]

## 2020-04-09 MED ORDER — SODIUM CHLORIDE 0.9% FLUSH
10.0000 mL | INTRAVENOUS | Status: DC | PRN
  Administered 2020-04-09 (×2): 10 mL

## 2020-04-09 MED ORDER — SODIUM CHLORIDE 0.9 % IV SOLN
10.0000 mg | Freq: Once | INTRAVENOUS | Status: AC
  Administered 2020-04-09: 10 mg via INTRAVENOUS
  Filled 2020-04-09: qty 10

## 2020-04-09 MED ORDER — DEXTROSE 5 % IV SOLN: Freq: Once | INTRAVENOUS | Status: AC

## 2020-04-09 MED ORDER — PALONOSETRON HCL INJECTION 0.25 MG/5ML
0.2500 mg | Freq: Once | INTRAVENOUS | Status: AC
  Administered 2020-04-09: 0.25 mg via INTRAVENOUS
  Filled 2020-04-09: qty 5

## 2020-04-09 MED ORDER — FAM-TRASTUZUMAB DERUXTECAN-NXKI CHEMO 100 MG IV SOLR
3.2000 mg/kg | Freq: Once | INTRAVENOUS | Status: AC
  Administered 2020-04-09: 168 mg via INTRAVENOUS
  Filled 2020-04-09: qty 8.4
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
## 2020-04-09 NOTE — Progress Notes (Signed)
Labs reviewed by MD today. Will proceed with treatment as planned.   Treatment given per orders. Patient tolerated it well without problems. Vitals stable and discharged home from clinic via wheelchair. Follow up as scheduled.

## 2020-04-09 NOTE — Patient Instructions (Signed)
[facility] Discharge Instructions for Patients Receiving Chemotherapy  Today you received the following chemotherapy agents   To help prevent nausea and vomiting after your treatment, we encourage you to take your nausea medication   If you develop nausea and vomiting that is not controlled by your nausea medication, call the clinic.   BELOW ARE SYMPTOMS THAT SHOULD BE REPORTED IMMEDIATELY:  *FEVER GREATER THAN 100.5 F  *CHILLS WITH OR WITHOUT FEVER  NAUSEA AND VOMITING THAT IS NOT CONTROLLED WITH YOUR NAUSEA MEDICATION  *UNUSUAL SHORTNESS OF BREATH  *UNUSUAL BRUISING OR BLEEDING  TENDERNESS IN MOUTH AND THROAT WITH OR WITHOUT PRESENCE OF ULCERS  *URINARY PROBLEMS  *BOWEL PROBLEMS  UNUSUAL RASH Items with * indicate a potential emergency and should be followed up as soon as possible.  Feel free to call the clinic should you have any questions or concerns. The clinic phone number is [phone].  Please show the CHEMO ALERT CARD at check-in to the Emergency Department and triage nurse.   

## 2020-04-09 NOTE — Patient Instructions (Signed)
[place] at [facility] Discharge Instructions  You were seen today by Dr. Delton Coombes. He went over your recent results and scans. You received your treatment today. Incorporate more protein into your diet to increase your blood protein level. Continue being physically active as much as tolerable. Dr. Delton Coombes will see you back in 3 weeks for labs and follow up.   Thank you for choosing [place] at [facility] to provide your oncology and hematology care.  To afford each patient quality time with our provider, please arrive at least 15 minutes before your scheduled appointment time.   If you have a lab appointment with the [facility] please come in thru the Main Entrance and check in at the main information desk  You need to re-schedule your appointment should you arrive 10 or more minutes late.  We strive to give you quality time with our providers, and arriving late affects you and other patients whose appointments are after yours.  Also, if you no show three or more times for appointments you may be dismissed from the clinic at the providers discretion.     Again, thank you for choosing [facility].  Our hope is that these requests will decrease the amount of time that you wait before being seen by our physicians.       _____________________________________________________________  Should you have questions after your visit to [facility], please contact our office at [phone] between the hours of 8:00 a.m. and 4:30 p.m.  Voicemails left after 4:00 p.m. will not be returned until the following business day.  For prescription refill requests, have your pharmacy contact our office and allow 72 hours.    Cancer Center Support Programs:   > Cancer Support Group  2nd Tuesday of the month 1pm-2pm, Journey Room

## 2020-04-29 ENCOUNTER — Encounter (HOSPITAL_COMMUNITY): Payer: Self-pay

## 2020-04-29 ENCOUNTER — Other Ambulatory Visit: Payer: Self-pay

## 2020-04-29 ENCOUNTER — Other Ambulatory Visit (HOSPITAL_COMMUNITY): Payer: Medicare Other

## 2020-04-29 ENCOUNTER — Inpatient Hospital Stay (HOSPITAL_COMMUNITY): Payer: Medicare Other

## 2020-04-29 ENCOUNTER — Inpatient Hospital Stay (HOSPITAL_COMMUNITY): Payer: Medicare Other | Attending: Hematology

## 2020-04-29 ENCOUNTER — Ambulatory Visit (HOSPITAL_COMMUNITY): Payer: Medicare Other | Admitting: Hematology

## 2020-04-29 DIAGNOSIS — Z836 Family history of other diseases of the respiratory system: Secondary | ICD-10-CM | POA: Diagnosis not present

## 2020-04-29 DIAGNOSIS — M2550 Pain in unspecified joint: Secondary | ICD-10-CM | POA: Insufficient documentation

## 2020-04-29 DIAGNOSIS — Z79899 Other long term (current) drug therapy: Secondary | ICD-10-CM | POA: Diagnosis not present

## 2020-04-29 DIAGNOSIS — Z9049 Acquired absence of other specified parts of digestive tract: Secondary | ICD-10-CM | POA: Insufficient documentation

## 2020-04-29 DIAGNOSIS — J90 Pleural effusion, not elsewhere classified: Secondary | ICD-10-CM | POA: Diagnosis not present

## 2020-04-29 DIAGNOSIS — I10 Essential (primary) hypertension: Secondary | ICD-10-CM | POA: Insufficient documentation

## 2020-04-29 DIAGNOSIS — Z8249 Family history of ischemic heart disease and other diseases of the circulatory system: Secondary | ICD-10-CM | POA: Insufficient documentation

## 2020-04-29 DIAGNOSIS — G479 Sleep disorder, unspecified: Secondary | ICD-10-CM | POA: Diagnosis not present

## 2020-04-29 DIAGNOSIS — C50812 Malignant neoplasm of overlapping sites of left female breast: Secondary | ICD-10-CM | POA: Insufficient documentation

## 2020-04-29 DIAGNOSIS — R634 Abnormal weight loss: Secondary | ICD-10-CM | POA: Insufficient documentation

## 2020-04-29 DIAGNOSIS — M79622 Pain in left upper arm: Secondary | ICD-10-CM | POA: Insufficient documentation

## 2020-04-29 DIAGNOSIS — Z87442 Personal history of urinary calculi: Secondary | ICD-10-CM | POA: Insufficient documentation

## 2020-04-29 DIAGNOSIS — R5383 Other fatigue: Secondary | ICD-10-CM | POA: Diagnosis not present

## 2020-04-29 DIAGNOSIS — M7989 Other specified soft tissue disorders: Secondary | ICD-10-CM | POA: Diagnosis not present

## 2020-04-29 DIAGNOSIS — Z5112 Encounter for antineoplastic immunotherapy: Secondary | ICD-10-CM | POA: Diagnosis not present

## 2020-04-29 DIAGNOSIS — C50912 Malignant neoplasm of unspecified site of left female breast: Secondary | ICD-10-CM

## 2020-04-29 LAB — CBC WITH DIFFERENTIAL/PLATELET
Abs Immature Granulocytes: 0.02 10*3/uL (ref 0.00–0.07)
Basophils Absolute: 0.1 10*3/uL (ref 0.0–0.1)
Basophils Relative: 1 %
Eosinophils Absolute: 0.1 10*3/uL (ref 0.0–0.5)
Eosinophils Relative: 3 %
HCT: 34.2 % — ABNORMAL LOW (ref 36.0–46.0)
Hemoglobin: 11 g/dL — ABNORMAL LOW (ref 12.0–15.0)
Immature Granulocytes: 1 %
Lymphocytes Relative: 17 %
Lymphs Abs: 0.7 10*3/uL (ref 0.7–4.0)
MCH: 34.2 pg — ABNORMAL HIGH (ref 26.0–34.0)
MCHC: 32.2 g/dL (ref 30.0–36.0)
MCV: 106.2 fL — ABNORMAL HIGH (ref 80.0–100.0)
Monocytes Absolute: 0.4 10*3/uL (ref 0.1–1.0)
Monocytes Relative: 9 %
Neutro Abs: 2.9 10*3/uL (ref 1.7–7.7)
Neutrophils Relative %: 69 %
Platelets: 181 10*3/uL (ref 150–400)
RBC: 3.22 MIL/uL — ABNORMAL LOW (ref 3.87–5.11)
RDW: 14.1 % (ref 11.5–15.5)
WBC: 4.2 10*3/uL (ref 4.0–10.5)
nRBC: 0 % (ref 0.0–0.2)

## 2020-04-29 LAB — COMPREHENSIVE METABOLIC PANEL
ALT: 18 U/L (ref 0–44)
AST: 26 U/L (ref 15–41)
Albumin: 3.4 g/dL — ABNORMAL LOW (ref 3.5–5.0)
Alkaline Phosphatase: 49 U/L (ref 38–126)
Anion gap: 11 (ref 5–15)
BUN: 23 mg/dL (ref 8–23)
CO2: 23 mmol/L (ref 22–32)
Calcium: 9.1 mg/dL (ref 8.9–10.3)
Chloride: 107 mmol/L (ref 98–111)
Creatinine, Ser: 1.04 mg/dL — ABNORMAL HIGH (ref 0.44–1.00)
GFR calc Af Amer: 54 mL/min — ABNORMAL LOW (ref >60)
GFR calc non Af Amer: 46 mL/min — ABNORMAL LOW (ref >60)
Glucose, Bld: 112 mg/dL — ABNORMAL HIGH (ref 70–99)
Potassium: 4.1 mmol/L (ref 3.5–5.1)
Sodium: 141 mmol/L (ref 135–145)
Total Bilirubin: 0.7 mg/dL (ref 0.3–1.2)
Total Protein: 5.9 g/dL — ABNORMAL LOW (ref 6.5–8.1)

## 2020-04-29 LAB — MAGNESIUM: Magnesium: 2 mg/dL (ref 1.7–2.4)

## 2020-04-29 MED ORDER — FAM-TRASTUZUMAB DERUXTECAN-NXKI CHEMO 100 MG IV SOLR
3.2000 mg/kg | Freq: Once | INTRAVENOUS | Status: AC
  Administered 2020-04-29: 168 mg via INTRAVENOUS
  Filled 2020-04-29: qty 8.4

## 2020-04-29 MED ORDER — DIPHENHYDRAMINE HCL 25 MG PO CAPS
ORAL_CAPSULE | ORAL | Status: AC
  Filled 2020-04-29: qty 1

## 2020-04-29 MED ORDER — SODIUM CHLORIDE 0.9 % IV SOLN
10.0000 mg | Freq: Once | INTRAVENOUS | Status: AC
  Administered 2020-04-29: 10 mg via INTRAVENOUS
  Filled 2020-04-29: qty 10

## 2020-04-29 MED ORDER — DIPHENHYDRAMINE HCL 25 MG PO CAPS
25.0000 mg | ORAL_CAPSULE | Freq: Once | ORAL | Status: AC
  Administered 2020-04-29: 25 mg via ORAL

## 2020-04-29 MED ORDER — DEXTROSE 5 % IV SOLN: Freq: Once | INTRAVENOUS | Status: AC

## 2020-04-29 MED ORDER — PALONOSETRON HCL INJECTION 0.25 MG/5ML
0.2500 mg | Freq: Once | INTRAVENOUS | Status: AC
  Administered 2020-04-29: 0.25 mg via INTRAVENOUS

## 2020-04-29 MED ORDER — HEPARIN SOD (PORK) LOCK FLUSH 100 UNIT/ML IV SOLN
500.0000 [IU] | Freq: Once | INTRAVENOUS | Status: AC | PRN
  Administered 2020-04-29: 500 [IU]

## 2020-04-29 MED ORDER — ACETAMINOPHEN 325 MG PO TABS
ORAL_TABLET | ORAL | Status: AC
  Filled 2020-04-29: qty 2

## 2020-04-29 MED ORDER — SODIUM CHLORIDE 0.9% FLUSH
10.0000 mL | INTRAVENOUS | Status: DC | PRN
  Administered 2020-04-29 (×2): 10 mL

## 2020-04-29 MED ORDER — ACETAMINOPHEN 325 MG PO TABS
650.0000 mg | ORAL_TABLET | Freq: Once | ORAL | Status: AC
  Administered 2020-04-29: 650 mg via ORAL

## 2020-04-29 MED ORDER — PALONOSETRON HCL INJECTION 0.25 MG/5ML
INTRAVENOUS | Status: AC
  Filled 2020-04-29: qty 5

## 2020-04-29 NOTE — Patient Instructions (Signed)
[facility] Discharge Instructions for Patients Receiving Chemotherapy  Today you received the following chemotherapy agents   To help prevent nausea and vomiting after your treatment, we encourage you to take your nausea medication   If you develop nausea and vomiting that is not controlled by your nausea medication, call the clinic.   BELOW ARE SYMPTOMS THAT SHOULD BE REPORTED IMMEDIATELY:  *FEVER GREATER THAN 100.5 F  *CHILLS WITH OR WITHOUT FEVER  NAUSEA AND VOMITING THAT IS NOT CONTROLLED WITH YOUR NAUSEA MEDICATION  *UNUSUAL SHORTNESS OF BREATH  *UNUSUAL BRUISING OR BLEEDING  TENDERNESS IN MOUTH AND THROAT WITH OR WITHOUT PRESENCE OF ULCERS  *URINARY PROBLEMS  *BOWEL PROBLEMS  UNUSUAL RASH Items with * indicate a potential emergency and should be followed up as soon as possible.  Feel free to call the clinic should you have any questions or concerns. The clinic phone number is [phone].  Please show the CHEMO ALERT CARD at check-in to the Emergency Department and triage nurse.   

## 2020-04-29 NOTE — Progress Notes (Signed)
Pt here for enhertu.  Pts vital signs are stable.  Labs are within parameter for treatment.  Okay to proceed.   Tolerated treatment well today without incidence.  Vital signs stable prior to discharge.  Discharged via wheelchair.

## 2020-04-30 LAB — CANCER ANTIGEN 15-3: CA 15-3: 8.3 U/mL (ref 0.0–25.0)

## 2020-05-09 DIAGNOSIS — E785 Hyperlipidemia, unspecified: Secondary | ICD-10-CM | POA: Diagnosis not present

## 2020-05-09 DIAGNOSIS — I10 Essential (primary) hypertension: Secondary | ICD-10-CM | POA: Diagnosis not present

## 2020-05-20 ENCOUNTER — Inpatient Hospital Stay (HOSPITAL_BASED_OUTPATIENT_CLINIC_OR_DEPARTMENT_OTHER): Payer: Medicare Other | Admitting: Hematology

## 2020-05-20 ENCOUNTER — Inpatient Hospital Stay (HOSPITAL_COMMUNITY): Payer: Medicare Other

## 2020-05-20 ENCOUNTER — Other Ambulatory Visit: Payer: Self-pay

## 2020-05-20 VITALS — BP 124/60 | HR 54 | Temp 97.0°F | Resp 16 | Wt 108.9 lb

## 2020-05-20 VITALS — BP 114/46 | HR 53 | Temp 96.9°F | Resp 18

## 2020-05-20 DIAGNOSIS — C50912 Malignant neoplasm of unspecified site of left female breast: Secondary | ICD-10-CM

## 2020-05-20 DIAGNOSIS — Z87442 Personal history of urinary calculi: Secondary | ICD-10-CM | POA: Diagnosis not present

## 2020-05-20 DIAGNOSIS — J90 Pleural effusion, not elsewhere classified: Secondary | ICD-10-CM | POA: Diagnosis not present

## 2020-05-20 DIAGNOSIS — I10 Essential (primary) hypertension: Secondary | ICD-10-CM | POA: Diagnosis not present

## 2020-05-20 DIAGNOSIS — R5383 Other fatigue: Secondary | ICD-10-CM | POA: Diagnosis not present

## 2020-05-20 DIAGNOSIS — M7989 Other specified soft tissue disorders: Secondary | ICD-10-CM | POA: Diagnosis not present

## 2020-05-20 DIAGNOSIS — R634 Abnormal weight loss: Secondary | ICD-10-CM | POA: Diagnosis not present

## 2020-05-20 DIAGNOSIS — M2550 Pain in unspecified joint: Secondary | ICD-10-CM | POA: Diagnosis not present

## 2020-05-20 DIAGNOSIS — Z79899 Other long term (current) drug therapy: Secondary | ICD-10-CM | POA: Diagnosis not present

## 2020-05-20 DIAGNOSIS — Z9049 Acquired absence of other specified parts of digestive tract: Secondary | ICD-10-CM | POA: Diagnosis not present

## 2020-05-20 DIAGNOSIS — C50812 Malignant neoplasm of overlapping sites of left female breast: Secondary | ICD-10-CM | POA: Diagnosis not present

## 2020-05-20 DIAGNOSIS — G479 Sleep disorder, unspecified: Secondary | ICD-10-CM | POA: Diagnosis not present

## 2020-05-20 DIAGNOSIS — Z5112 Encounter for antineoplastic immunotherapy: Secondary | ICD-10-CM | POA: Diagnosis not present

## 2020-05-20 DIAGNOSIS — Z836 Family history of other diseases of the respiratory system: Secondary | ICD-10-CM | POA: Diagnosis not present

## 2020-05-20 DIAGNOSIS — M79622 Pain in left upper arm: Secondary | ICD-10-CM | POA: Diagnosis not present

## 2020-05-20 DIAGNOSIS — Z8249 Family history of ischemic heart disease and other diseases of the circulatory system: Secondary | ICD-10-CM | POA: Diagnosis not present

## 2020-05-20 LAB — CBC WITH DIFFERENTIAL/PLATELET
Abs Immature Granulocytes: 0.01 10*3/uL (ref 0.00–0.07)
Basophils Absolute: 0.1 10*3/uL (ref 0.0–0.1)
Basophils Relative: 1 %
Eosinophils Absolute: 0.2 10*3/uL (ref 0.0–0.5)
Eosinophils Relative: 4 %
HCT: 35.1 % — ABNORMAL LOW (ref 36.0–46.0)
Hemoglobin: 11.1 g/dL — ABNORMAL LOW (ref 12.0–15.0)
Immature Granulocytes: 0 %
Lymphocytes Relative: 17 %
Lymphs Abs: 0.7 10*3/uL (ref 0.7–4.0)
MCH: 33.6 pg (ref 26.0–34.0)
MCHC: 31.6 g/dL (ref 30.0–36.0)
MCV: 106.4 fL — ABNORMAL HIGH (ref 80.0–100.0)
Monocytes Absolute: 0.4 10*3/uL (ref 0.1–1.0)
Monocytes Relative: 10 %
Neutro Abs: 2.6 10*3/uL (ref 1.7–7.7)
Neutrophils Relative %: 68 %
Platelets: 180 10*3/uL (ref 150–400)
RBC: 3.3 MIL/uL — ABNORMAL LOW (ref 3.87–5.11)
RDW: 14.1 % (ref 11.5–15.5)
WBC: 3.9 10*3/uL — ABNORMAL LOW (ref 4.0–10.5)
nRBC: 0 % (ref 0.0–0.2)

## 2020-05-20 LAB — COMPREHENSIVE METABOLIC PANEL
ALT: 20 U/L (ref 0–44)
AST: 24 U/L (ref 15–41)
Albumin: 3.4 g/dL — ABNORMAL LOW (ref 3.5–5.0)
Alkaline Phosphatase: 52 U/L (ref 38–126)
Anion gap: 10 (ref 5–15)
BUN: 28 mg/dL — ABNORMAL HIGH (ref 8–23)
CO2: 22 mmol/L (ref 22–32)
Calcium: 9.2 mg/dL (ref 8.9–10.3)
Chloride: 108 mmol/L (ref 98–111)
Creatinine, Ser: 1.05 mg/dL — ABNORMAL HIGH (ref 0.44–1.00)
GFR calc Af Amer: 53 mL/min — ABNORMAL LOW (ref >60)
GFR calc non Af Amer: 46 mL/min — ABNORMAL LOW (ref >60)
Glucose, Bld: 127 mg/dL — ABNORMAL HIGH (ref 70–99)
Potassium: 3.9 mmol/L (ref 3.5–5.1)
Sodium: 140 mmol/L (ref 135–145)
Total Bilirubin: 0.6 mg/dL (ref 0.3–1.2)
Total Protein: 5.7 g/dL — ABNORMAL LOW (ref 6.5–8.1)

## 2020-05-20 LAB — MAGNESIUM: Magnesium: 2 mg/dL (ref 1.7–2.4)

## 2020-05-20 MED ORDER — PALONOSETRON HCL INJECTION 0.25 MG/5ML
0.2500 mg | Freq: Once | INTRAVENOUS | Status: AC
  Administered 2020-05-20: 0.25 mg via INTRAVENOUS
  Filled 2020-05-20: qty 5

## 2020-05-20 MED ORDER — DEXTROSE 5 % IV SOLN: Freq: Once | INTRAVENOUS | Status: AC

## 2020-05-20 MED ORDER — SODIUM CHLORIDE 0.9% FLUSH
10.0000 mL | INTRAVENOUS | Status: DC | PRN
  Administered 2020-05-20: 10 mL

## 2020-05-20 MED ORDER — SODIUM CHLORIDE 0.9 % IV SOLN
10.0000 mg | Freq: Once | INTRAVENOUS | Status: AC
  Administered 2020-05-20: 10 mg via INTRAVENOUS
  Filled 2020-05-20: qty 1

## 2020-05-20 MED ORDER — HEPARIN SOD (PORK) LOCK FLUSH 100 UNIT/ML IV SOLN
500.0000 [IU] | Freq: Once | INTRAVENOUS | Status: AC | PRN
  Administered 2020-05-20: 500 [IU]

## 2020-05-20 MED ORDER — ACETAMINOPHEN 325 MG PO TABS
650.0000 mg | ORAL_TABLET | Freq: Once | ORAL | Status: AC
  Administered 2020-05-20: 650 mg via ORAL
  Filled 2020-05-20: qty 2

## 2020-05-20 MED ORDER — FAM-TRASTUZUMAB DERUXTECAN-NXKI CHEMO 100 MG IV SOLR
3.2000 mg/kg | Freq: Once | INTRAVENOUS | Status: AC
  Administered 2020-05-20: 168 mg via INTRAVENOUS
  Filled 2020-05-20: qty 8.4

## 2020-05-20 MED ORDER — DIPHENHYDRAMINE HCL 25 MG PO CAPS
25.0000 mg | ORAL_CAPSULE | Freq: Once | ORAL | Status: AC
  Administered 2020-05-20: 25 mg via ORAL
  Filled 2020-05-20: qty 1

## 2020-05-20 NOTE — Progress Notes (Signed)
Patient tolerated chemotherapy with no complaints voiced.  Side effects with management reviewed with understanding verbalized.  Port site clean and dry with no bruising or swelling noted at site.  Good blood return noted before and after administration of chemotherapy.  Band aid applied.  Patient left in satisfactory condition with VSS and no s/s of distress noted.   

## 2020-05-20 NOTE — Progress Notes (Signed)
Patient was assessed by Dr. Delton Coombes and labs have been reviewed.  Bun and Creatinine slightly elevated, okay to proceed with treatment today. Primary RN and pharmacy aware.

## 2020-05-20 NOTE — Patient Instructions (Signed)
[facility] Discharge Instructions  You were seen today by Dr. Delton Coombes. He went over your recent results. You received your treatment today. You will be scheduled for an echocardiogram before your next visit. You can stop taking dronabinol and aspirin. Dr. Delton Coombes will see you back in 3 weeks for labs and follow up.   Thank you for choosing [place] at [facility] to provide your oncology and hematology care.  To afford each patient quality time with our provider, please arrive at least 15 minutes before your scheduled appointment time.   If you have a lab appointment with the [place] [facility] please come in thru the Main Entrance and check in at the main information desk  You need to re-schedule your appointment should you arrive 10 or more minutes late.  We strive to give you quality time with our providers, and arriving late affects you and other patients whose appointments are after yours.  Also, if you no show three or more times for appointments you may be dismissed from the clinic at the providers discretion.     Again, thank you for choosing [facility].  Our hope is that these requests will decrease the amount of time that you wait before being seen by our physicians.       _____________________________________________________________  Should you have questions after your visit to [facility] please contact our office at [phone] between the hours of 8:00 a.m. and 4:30 p.m.  Voicemails left after 4:00 p.m. will not be returned until the following business day.  For prescription refill requests, have your pharmacy contact our office and allow 72 hours.    Cancer Center Support Programs:   > Cancer Support Group  2nd Tuesday of the month 1pm-2pm, Journey Room

## 2020-05-20 NOTE — Progress Notes (Signed)
[facility] [address] [place] [address]   CLINIC:  Medical Oncology/Hematology  PCP:  [name], MD [place] / [place] [address] [phone]   REASON FOR VISIT:  Follow-up for invasive ductal carcinoma of left breast  PRIOR THERAPY:  1. Lumpectomy on 04/24/2017. 2. Left modified radical mastectomy on 05/28/2018. 2. Trastuzumab x 10 cycles, pertuzumab x 6 cycles, emtansine x 8 cycles.  NGS Results: Not done  CURRENT THERAPY: Enhertu and Aloxi every 3 weeks  BRIEF ONCOLOGIC HISTORY:  Oncology History  Invasive ductal carcinoma of left breast ([place])  03/30/2017 Initial Diagnosis   Invasive ductal carcinoma of left breast ([place])   08/09/2018 - 03/20/2019 Chemotherapy   The patient had trastuzumab (HERCEPTIN) 500 mg in sodium chloride 0.9 % 250 mL chemo infusion, 525 mg, Intravenous,  Once, 10 of 10 cycles Administration: 500 mg (08/09/2018), 350 mg (09/06/2018), 350 mg (09/27/2018), 378 mg (10/18/2018), 378 mg (11/15/2018), 378 mg (12/06/2018), 378 mg (12/28/2018), 378 mg (01/18/2019), 378 mg (02/08/2019) pertuzumab (PERJETA) 840 mg in sodium chloride 0.9 % 250 mL chemo infusion, 840 mg (100 % of original dose 840 mg), Intravenous, Once, 6 of 6 cycles Dose modification: 840 mg (original dose 840 mg, Cycle 5), 420 mg (original dose 420 mg, Cycle 6) Administration: 840 mg (11/15/2018), 420 mg (12/06/2018), 420 mg (12/28/2018), 420 mg (01/18/2019), 420 mg (02/08/2019)  for chemotherapy treatment.    09/18/2018 Genetic Testing   Negative genetic testing for the breast and gyn cancer panel.  The Breast/GYN gene panel offered by [facility] includes sequencing and rearrangement analysis for the following 23 genes:  ATM, BRCA1, BRCA2, BRIP1, CDH1, CHEK2, EPCAM, MLH1, MSH2, MSH6, NBN, NF1, PALB2, PMS2, PTEN, RAD51C, RAD51D, STK11, and TP53.   The report date is 09/18/2018.   03/12/2019 - 09/09/2019 Chemotherapy   The patient had ado-trastuzumab emtansine (KADCYLA)  140 mg in sodium chloride 0.9 % 250 mL chemo infusion, 2.4 mg/kg = 140 mg (100 % of original dose 2.4 mg/kg), Intravenous, Once, 8 of 9 cycles Dose modification: 2.4 mg/kg (original dose 2.4 mg/kg, Cycle 1, Reason: Patient Age), 3 mg/kg (original dose 2.4 mg/kg, Cycle 3, Reason: Provider Judgment) Administration: 140 mg (03/12/2019), 180 mg (04/23/2019), 180 mg (06/18/2019), 180 mg (04/02/2019), 180 mg (05/28/2019), 180 mg (07/09/2019), 180 mg (07/30/2019), 180 mg (08/20/2019)  for chemotherapy treatment.    10/22/2019 -  Chemotherapy   The patient had palonosetron (ALOXI) injection 0.25 mg, 0.25 mg, Intravenous,  Once, 10 of 12 cycles Administration: 0.25 mg (10/22/2019), 0.25 mg (11/12/2019), 0.25 mg (01/14/2020), 0.25 mg (02/04/2020), 0.25 mg (12/03/2019), 0.25 mg (12/24/2019), 0.25 mg (02/27/2020), 0.25 mg (03/19/2020), 0.25 mg (04/09/2020), 0.25 mg (04/29/2020) fam-trastuzumab deruxtecan-nxki (ENHERTU) 168 mg in dextrose 5 % 100 mL chemo infusion, 3.2 mg/kg = 168 mg (100 % of original dose 3.2 mg/kg), Intravenous,  Once, 10 of 12 cycles Dose modification: 3.2 mg/kg (original dose 3.2 mg/kg, Cycle 1, Reason: Provider Judgment), 3.2 mg/kg (original dose 3.2 mg/kg, Cycle 2, Reason: Patient Age), 3.2 mg/kg (original dose 3.2 mg/kg, Cycle 5, Reason: Patient Age), 3.2 mg/kg (original dose 3.2 mg/kg, Cycle 3, Reason: Provider Judgment), 3.2 mg/kg (original dose 3.2 mg/kg, Cycle 4, Reason: Provider Judgment) Administration: 168 mg (10/22/2019), 168 mg (11/12/2019), 168 mg (01/14/2020), 168 mg (02/04/2020), 168 mg (12/03/2019), 168 mg (12/24/2019), 168 mg (02/27/2020), 168 mg (03/19/2020), 168 mg (04/09/2020), 168 mg (04/29/2020)  for chemotherapy treatment.      CANCER STAGING: Cancer Staging No matching staging information was  found for the patient.  INTERVAL HISTORY:  Ms. Cheyne Bungert, a 86 y.o. female, returns for routine follow-up and consideration for next cycle of chemotherapy. Adahlia was last seen on 04/09/2020.  Due for  cycle #11 of Enhertu and Aloxi today.   Overall, she tells me she has been feeling pretty well. She complains of having intermittent pain with burning sensation in her left axilla which hurts 3-4 times per week, but today is not hurting; there is no apparent positional preference and the pain can start even when she is reclining. She denies having cardiac issues or MI's. Her legs have stopped swelling since she started cooking. She denies having SOB or CP.  Overall, she feels ready for next cycle of chemo today.    REVIEW OF SYSTEMS:  Review of Systems  Constitutional: Positive for appetite change (moderately decreased) and fatigue (severe).  Respiratory: Negative for shortness of breath.   Cardiovascular: Negative for chest pain and leg swelling.  Musculoskeletal: Positive for arthralgias (pain and burning sensation in L axilla).  All other systems reviewed and are negative.   PAST MEDICAL/SURGICAL HISTORY:  Past Medical History:  Diagnosis Date  . Cancer ([place])    left breast  . History of kidney stones   . Hypercholesteremia   . Hypertension    Past Surgical History:  Procedure Laterality Date  . APPENDECTOMY    . brain cyst removed  2011  . BREAST BIOPSY Left 03/28/2018   Procedure: BREAST BIOPSY;  Surgeon: [name], MD;  Location: [facility];  Service: General;  Laterality: Left;  . CATARACT EXTRACTION W/PHACO Left 11/09/2015   Procedure: CATARACT EXTRACTION PHACO AND INTRAOCULAR LENS PLACEMENT LEFT EYE cde=8.97;  Surgeon: [name], MD;  Location: [facility];  Service: Ophthalmology;  Laterality: Left;  . CATARACT EXTRACTION W/PHACO Right 02/04/2019   Procedure: CATARACT EXTRACTION PHACO AND INTRAOCULAR LENS PLACEMENT (IOC);  Surgeon: [name], MD;  Location: [facility];  Service: Ophthalmology;  Laterality: Right;  CDE: 15.19  . EYE SURGERY     KPE left  . MASTECTOMY MODIFIED RADICAL Left 05/28/2018   Procedure: LEFT MODIFIED RADICAL MASTECTOMY;  Surgeon: [name], MD;   Location: [facility];  Service: General;  Laterality: Left;  Marland Kitchen MASTECTOMY, PARTIAL Left 04/24/2017   Procedure: MASTECTOMY PARTIAL;  Surgeon: [name], MD;  Location: [facility];  Service: General;  Laterality: Left;  . ORIF FEMUR FRACTURE Left 09/03/2019   Procedure: OPEN REDUCTION INTERNAL FIXATION (ORIF) LEFT HIP FEMUR FRACTURE;  Surgeon: Paralee Cancel, MD;  Location: [facility];  Service: Orthopedics;  Laterality: Left;  . PORTACATH PLACEMENT Right 08/08/2018   Procedure: INSERTION PORT-A-CATH;  Surgeon: [name], MD;  Location: [facility];  Service: General;  Laterality: Right;  . TONSILLECTOMY    . TONSILLECTOMY AND ADENOIDECTOMY      SOCIAL HISTORY:  Social History   Socioeconomic History  . Marital status: Widowed    Spouse name: Not on file  . Number of children: Not on file  . Years of education: Not on file  . Highest education level: Not on file  Occupational History  . Not on file  Tobacco Use  . Smoking status: Never Smoker  . Smokeless tobacco: Never Used  Vaping Use  . Vaping Use: Never used  Substance and Sexual Activity  . Alcohol use: No  . Drug use: No  . Sexual activity: Not Currently    Partners: Male  Other Topics Concern  . Not on file  Social History Narrative  .  Not on file   Social Determinants of Health   Financial Resource Strain:   . Difficulty of Paying Living Expenses: Not on file  Food Insecurity:   . Worried About Charity fundraiser in the Last Year: Not on file  . Ran Out of Food in the Last Year: Not on file  Transportation Needs:   . Lack of Transportation (Medical): Not on file  . Lack of Transportation (Non-Medical): Not on file  Physical Activity:   . Days of Exercise per Week: Not on file  . Minutes of Exercise per Session: Not on file  Stress:   . Feeling of Stress : Not on file  Social Connections:   . Frequency of Communication with Friends and Family: Not on file  . Frequency of Social Gatherings with Friends and Family: Not  on file  . Attends Religious Services: Not on file  . Active Member of Clubs or Organizations: Not on file  . Attends Archivist Meetings: Not on file  . Marital Status: Not on file  Intimate Partner Violence:   . Fear of Current or Ex-Partner: Not on file  . Emotionally Abused: Not on file  . Physically Abused: Not on file  . Sexually Abused: Not on file    FAMILY HISTORY:  Family History  Problem Relation Age of Onset  . Heart failure Mother   . Heart failure Father   . Congestive Heart Failure Brother   . Tuberculosis Paternal Uncle   . Tuberculosis Maternal Grandmother   . Tuberculosis Maternal Grandfather   . Congestive Heart Failure Brother     CURRENT MEDICATIONS:  Current Outpatient Medications  Medication Sig Dispense Refill  . Ado-Trastuzumab Emtansine (KADCYLA IV) Inject into the vein every 21 ( twenty-one) days.    Marland Kitchen amLODipine (NORVASC) 5 MG tablet Take 5 mg by mouth daily.    . ferrous sulfate 325 (65 FE) MG tablet Take 325 mg by mouth daily with breakfast.    . labetalol (NORMODYNE) 100 MG tablet Take 1 tablet (100 mg total) by mouth 2 (two) times daily. 60 tablet 12  . oxybutynin (DITROPAN) 5 MG tablet Take 5 mg by mouth daily.     Marland Kitchen PREVIDENT 5000 BOOSTER PLUS 1.1 % PSTE APPLY WITH TOOTHBRUSH AT BEDTIME AS DIRECTED.    Marland Kitchen simvastatin (ZOCOR) 40 MG tablet Take 40 mg by mouth daily.    . temazepam (RESTORIL) 30 MG capsule Take 1 capsule (30 mg total) by mouth at bedtime as needed for sleep. May increase dose to 2 capsules if 1 capsule does not help. 90 capsule 1   No current facility-administered medications for this visit.    ALLERGIES:  Allergies  Allergen Reactions  . Contrast Media [Iodinated Diagnostic Agents] Diarrhea    PHYSICAL EXAM:  Performance status (ECOG): 2 - Symptomatic, <50% confined to bed  Vitals:   05/20/20 0959  BP: 124/60  Pulse: (!) 54  Resp: 16  Temp: (!) 97 F (36.1 C)  SpO2: 100%   Wt Readings from Last 3  Encounters:  05/20/20 108 lb 14.5 oz (49.4 kg)  04/29/20 108 lb 12.8 oz (49.4 kg)  04/09/20 111 lb 8 oz (50.6 kg)   Physical Exam Vitals reviewed.  Constitutional:      Appearance: Normal appearance.  Cardiovascular:     Rate and Rhythm: Normal rate and regular rhythm.     Pulses: Normal pulses.     Heart sounds: Normal heart sounds.  Pulmonary:  Effort: Pulmonary effort is normal.     Breath sounds: Normal breath sounds.  Chest:     Comments: Port-a-Cath in R chest Lymphadenopathy:     Upper Body:     Right upper body: No supraclavicular, axillary or pectoral adenopathy.     Left upper body: No supraclavicular, axillary or pectoral adenopathy.  Neurological:     General: No focal deficit present.     Mental Status: She is alert and oriented to person, place, and time.  Psychiatric:        Mood and Affect: Mood normal.        Behavior: Behavior normal.     LABORATORY DATA:  I have reviewed the labs as listed.  CBC Latest Ref Rng & Units 05/20/2020 04/29/2020 04/09/2020  WBC 4.0 - 10.5 K/uL 3.9(L) 4.2 4.3  Hemoglobin 12.0 - 15.0 g/dL 11.1(L) 11.0(L) 10.7(L)  Hematocrit 36 - 46 % 35.1(L) 34.2(L) 33.6(L)  Platelets 150 - 400 K/uL 180 181 192   CMP Latest Ref Rng & Units 05/20/2020 04/29/2020 04/09/2020  Glucose 70 - 99 mg/dL 127(H) 112(H) 131(H)  BUN 8 - 23 mg/dL 28(H) 23 22  Creatinine 0.44 - 1.00 mg/dL 1.05(H) 1.04(H) 1.23(H)  Sodium 135 - 145 mmol/L 140 141 140  Potassium 3.5 - 5.1 mmol/L 3.9 4.1 3.8  Chloride 98 - 111 mmol/L 108 107 106  CO2 22 - 32 mmol/L _0 Calcium 8.9 - 10.3 mg/dL 9.2 9.1 9.0  Total Protein 6.5 - 8.1 g/dL 5.7(L) 5.9(L) 5.7(L)  Total Bilirubin 0.3 - 1.2 mg/dL 0.6 0.7 0.6  Alkaline Phos 38 - 126 U/L 52 49 46  AST 15 - 41 U/L _1 ALT 0 - 44 U/L _2 DIAGNOSTIC IMAGING:  I have independently reviewed the scans and discussed with the patient. No results found.   ASSESSMENT:  1. Metastatic HER-2 positive left breast  cancer: -Enhertu started on 10/22/2019. -CT CAP on 01/10/2020 showed response to therapy with decreasing lung nodules as well as supraclavicular and mediastinal adenopathy with no new evidence of metastatic disease. -CA 15-3 of 6.4 on 02/27/2020. -CT CAP on 04/08/2020 shows more or less stable lung and liver lesions.  Left infrahilar mass measures 5.6 x 5.3 cm, previously 5.4 x 5.4 cm.  No new areas were seen.  Small dependent bilateral pleural effusions are new. -We will do next CT scan WITHOUT PO CONTRAST.  2. High risk drug monitoring: -Echo on 11/12/2019, EF 60-65%. -Echo on 02/24/2020 with EF 60-65%.   PLAN:  1. Metastatic HER-2 positive left breast cancer: -She is tolerating Enhertu very well. -Reviewed LFTs which are within normal limits.  White count is slightly low at 3.9 but ANC is normal.  Platelet is normal. -We will proceed with her next treatment today.  We will reevaluate her in 3 weeks. -I plan to repeat PET scan in 6 weeks.  She could not tolerate the oral dye from CT scan. -She also reported some left axillary pain 3-4 times a week which is dull pain.  No palpable lymphadenopathy in this area.  She never had bone lesions when I reviewed older scans.  2. High risk drug monitoring: -Last echo was on 02/24/2020 with normal ejection fraction.  Plan to repeat echo prior to next visit in 3 weeks.  3.Leg swellings: -Albumin is 3.4.  Leg swellings have improved.  4. Difficulty sleeping: -Continue temazepam as needed which is helping.  5. Weight loss: -Her appetite has  picked up.  We will discontinue Marinol.   Orders placed this encounter:  Orders Placed This Encounter  Procedures  . CBC with Differential/Platelet  . Comprehensive metabolic panel  . Cancer antigen 15-3  . Magnesium  . Lactate dehydrogenase  . ECHOCARDIOGRAM COMPLETE     Derek Jack, MD [place] [phone]   I, Milinda Antis, am acting as a scribe for Dr.  Sanda Linger.  I, Derek Jack MD, have reviewed the above documentation for accuracy and completeness, and I agree with the above.

## 2020-06-08 ENCOUNTER — Other Ambulatory Visit: Payer: Self-pay

## 2020-06-08 ENCOUNTER — Ambulatory Visit (HOSPITAL_COMMUNITY)
Admission: RE | Admit: 2020-06-08 | Discharge: 2020-06-08 | Disposition: A | Discharge status: Home / Self Care | Payer: Medicare Other | Source: Ambulatory Visit | Attending: Hematology | Admitting: Hematology

## 2020-06-08 DIAGNOSIS — Z0189 Encounter for other specified special examinations: Secondary | ICD-10-CM | POA: Diagnosis not present

## 2020-06-08 DIAGNOSIS — C50912 Malignant neoplasm of unspecified site of left female breast: Secondary | ICD-10-CM

## 2020-06-08 DIAGNOSIS — Z01818 Encounter for other preprocedural examination: Secondary | ICD-10-CM | POA: Insufficient documentation

## 2020-06-08 DIAGNOSIS — E785 Hyperlipidemia, unspecified: Secondary | ICD-10-CM | POA: Insufficient documentation

## 2020-06-08 DIAGNOSIS — Z9012 Acquired absence of left breast and nipple: Secondary | ICD-10-CM | POA: Diagnosis not present

## 2020-06-08 DIAGNOSIS — I10 Essential (primary) hypertension: Secondary | ICD-10-CM | POA: Diagnosis not present

## 2020-06-08 LAB — ECHOCARDIOGRAM COMPLETE
Area-P 1/2: 3.43 cm2
Calc EF: 57.8 %
S' Lateral: 2.34 cm
Single Plane A2C EF: 46.1 %
Single Plane A4C EF: 66.3 %

## 2020-06-08 NOTE — Progress Notes (Signed)
*  PRELIMINARY RESULTS* Echocardiogram 2D Echocardiogram has been performed.  Carol Duarte 06/08/2020, 11:28 AM

## 2020-06-10 ENCOUNTER — Other Ambulatory Visit: Payer: Self-pay

## 2020-06-10 ENCOUNTER — Inpatient Hospital Stay (HOSPITAL_COMMUNITY): Payer: Medicare Other

## 2020-06-10 ENCOUNTER — Inpatient Hospital Stay (HOSPITAL_COMMUNITY): Payer: Medicare Other | Attending: Hematology | Admitting: Hematology

## 2020-06-10 VITALS — BP 125/54 | HR 52 | Temp 97.2°F | Resp 16 | Wt 110.8 lb

## 2020-06-10 DIAGNOSIS — R531 Weakness: Secondary | ICD-10-CM | POA: Diagnosis not present

## 2020-06-10 DIAGNOSIS — M79622 Pain in left upper arm: Secondary | ICD-10-CM | POA: Insufficient documentation

## 2020-06-10 DIAGNOSIS — Z8249 Family history of ischemic heart disease and other diseases of the circulatory system: Secondary | ICD-10-CM | POA: Diagnosis not present

## 2020-06-10 DIAGNOSIS — R5383 Other fatigue: Secondary | ICD-10-CM | POA: Diagnosis not present

## 2020-06-10 DIAGNOSIS — Z836 Family history of other diseases of the respiratory system: Secondary | ICD-10-CM | POA: Insufficient documentation

## 2020-06-10 DIAGNOSIS — Z5112 Encounter for antineoplastic immunotherapy: Secondary | ICD-10-CM | POA: Diagnosis not present

## 2020-06-10 DIAGNOSIS — C50912 Malignant neoplasm of unspecified site of left female breast: Secondary | ICD-10-CM

## 2020-06-10 DIAGNOSIS — Z79899 Other long term (current) drug therapy: Secondary | ICD-10-CM | POA: Diagnosis not present

## 2020-06-10 DIAGNOSIS — C50512 Malignant neoplasm of lower-outer quadrant of left female breast: Secondary | ICD-10-CM | POA: Diagnosis not present

## 2020-06-10 DIAGNOSIS — C787 Secondary malignant neoplasm of liver and intrahepatic bile duct: Secondary | ICD-10-CM | POA: Insufficient documentation

## 2020-06-10 LAB — CBC WITH DIFFERENTIAL/PLATELET
Abs Immature Granulocytes: 0.01 10*3/uL (ref 0.00–0.07)
Basophils Absolute: 0.1 10*3/uL (ref 0.0–0.1)
Basophils Relative: 1 %
Eosinophils Absolute: 0.1 10*3/uL (ref 0.0–0.5)
Eosinophils Relative: 3 %
HCT: 33.8 % — ABNORMAL LOW (ref 36.0–46.0)
Hemoglobin: 11.1 g/dL — ABNORMAL LOW (ref 12.0–15.0)
Immature Granulocytes: 0 %
Lymphocytes Relative: 23 %
Lymphs Abs: 0.9 10*3/uL (ref 0.7–4.0)
MCH: 33.6 pg (ref 26.0–34.0)
MCHC: 32.8 g/dL (ref 30.0–36.0)
MCV: 102.4 fL — ABNORMAL HIGH (ref 80.0–100.0)
Monocytes Absolute: 0.4 10*3/uL (ref 0.1–1.0)
Monocytes Relative: 9 %
Neutro Abs: 2.6 10*3/uL (ref 1.7–7.7)
Neutrophils Relative %: 64 %
Platelets: 162 10*3/uL (ref 150–400)
RBC: 3.3 MIL/uL — ABNORMAL LOW (ref 3.87–5.11)
RDW: 14 % (ref 11.5–15.5)
WBC: 4.1 10*3/uL (ref 4.0–10.5)
nRBC: 0 % (ref 0.0–0.2)

## 2020-06-10 LAB — COMPREHENSIVE METABOLIC PANEL
ALT: 21 U/L (ref 0–44)
AST: 27 U/L (ref 15–41)
Albumin: 3.2 g/dL — ABNORMAL LOW (ref 3.5–5.0)
Alkaline Phosphatase: 51 U/L (ref 38–126)
Anion gap: 7 (ref 5–15)
BUN: 22 mg/dL (ref 8–23)
CO2: 27 mmol/L (ref 22–32)
Calcium: 8.9 mg/dL (ref 8.9–10.3)
Chloride: 107 mmol/L (ref 98–111)
Creatinine, Ser: 1.53 mg/dL — ABNORMAL HIGH (ref 0.44–1.00)
GFR, Estimated: 29 mL/min — ABNORMAL LOW (ref >60)
Glucose, Bld: 145 mg/dL — ABNORMAL HIGH (ref 70–99)
Potassium: 3.9 mmol/L (ref 3.5–5.1)
Sodium: 141 mmol/L (ref 135–145)
Total Bilirubin: 0.6 mg/dL (ref 0.3–1.2)
Total Protein: 5.5 g/dL — ABNORMAL LOW (ref 6.5–8.1)

## 2020-06-10 LAB — LACTATE DEHYDROGENASE: LDH: 128 U/L (ref 98–192)

## 2020-06-10 LAB — MAGNESIUM: Magnesium: 2 mg/dL (ref 1.7–2.4)

## 2020-06-10 MED ORDER — HEPARIN SOD (PORK) LOCK FLUSH 100 UNIT/ML IV SOLN
500.0000 [IU] | Freq: Once | INTRAVENOUS | Status: AC
  Administered 2020-06-10: 500 [IU] via INTRAVENOUS

## 2020-06-10 MED ORDER — SODIUM CHLORIDE 0.9 % IV SOLN: INTRAVENOUS | Status: DC

## 2020-06-10 NOTE — Progress Notes (Signed)
Pt here for enhertu today.  Labs reviewed with Dr Raliegh Ip. Creatinine 1.53. We are holding tx today. please give normal saline 1 liter over 2 hours today. have her come back tomorrow and check BMP and as long as creatinine is down we will give treatment tomorrow. Pt will stay accessed.   Tolerated fluids well today. Discharged in stable condition via wheelchair.

## 2020-06-10 NOTE — Patient Instructions (Signed)
[facility] Discharge Instructions for Patients Receiving Chemotherapy  Today you received the following chemotherapy agents   To help prevent nausea and vomiting after your treatment, we encourage you to take your nausea medication   If you develop nausea and vomiting that is not controlled by your nausea medication, call the clinic.   BELOW ARE SYMPTOMS THAT SHOULD BE REPORTED IMMEDIATELY:  *FEVER GREATER THAN 100.5 F  *CHILLS WITH OR WITHOUT FEVER  NAUSEA AND VOMITING THAT IS NOT CONTROLLED WITH YOUR NAUSEA MEDICATION  *UNUSUAL SHORTNESS OF BREATH  *UNUSUAL BRUISING OR BLEEDING  TENDERNESS IN MOUTH AND THROAT WITH OR WITHOUT PRESENCE OF ULCERS  *URINARY PROBLEMS  *BOWEL PROBLEMS  UNUSUAL RASH Items with * indicate a potential emergency and should be followed up as soon as possible.  Feel free to call the clinic should you have any questions or concerns. The clinic phone number is [phone].  Please show the CHEMO ALERT CARD at check-in to the Emergency Department and triage nurse.   

## 2020-06-10 NOTE — Progress Notes (Signed)
[name] [name]   CLINIC:  Medical Oncology/Hematology  PCP:  [name], MD [place] / [place] [address] [phone]   REASON FOR VISIT:  Follow-up for invasive ductal carcinoma of left breast  PRIOR THERAPY:  1. Lumpectomy on 04/24/2017. 2. Left modified radical mastectomy on 05/28/2018. 2. Trastuzumab x 10 cycles, pertuzumab x 6 cycles, emtansine x 8 cycles.  NGS Results: Not done  CURRENT THERAPY: Enhertu and Aloxi every 3 weeks  BRIEF ONCOLOGIC HISTORY:  Oncology History  Invasive ductal carcinoma of left breast ([place])  03/30/2017 Initial Diagnosis   Invasive ductal carcinoma of left breast ([place])   08/09/2018 - 02/28/2019 Chemotherapy   The patient had lapatinib (TYKERB) 250 MG tablet, 1,000 mg, Oral, Daily, 1 of 1 cycle, Start date: --, End date: -- trastuzumab (HERCEPTIN) 500 mg in sodium chloride 0.9 % 250 mL chemo infusion, 525 mg, Intravenous,  Once, 10 of 10 cycles Administration: 500 mg (08/09/2018), 350 mg (09/06/2018), 350 mg (09/27/2018), 378 mg (10/18/2018), 378 mg (11/15/2018), 378 mg (12/06/2018), 378 mg (12/28/2018), 378 mg (01/18/2019), 378 mg (02/08/2019) pertuzumab (PERJETA) 840 mg in sodium chloride 0.9 % 250 mL chemo infusion, 840 mg (100 % of original dose 840 mg), Intravenous, Once, 6 of 6 cycles Dose modification: 840 mg (original dose 840 mg, Cycle 5), 420 mg (original dose 420 mg, Cycle 6) Administration: 840 mg (11/15/2018), 420 mg (12/06/2018), 420 mg (12/28/2018), 420 mg (01/18/2019), 420 mg (02/08/2019)  for chemotherapy treatment.    09/18/2018 Genetic Testing   Negative genetic testing for the breast and gyn cancer panel.  The Breast/GYN gene panel offered by [facility] includes sequencing and rearrangement analysis for the following 23 genes:  ATM, BRCA1, BRCA2, BRIP1, CDH1, CHEK2, EPCAM, MLH1, MSH2, MSH6, NBN, NF1, PALB2, PMS2, PTEN, RAD51C, RAD51D, STK11, and TP53.   The report date is  09/18/2018.   03/12/2019 - 08/20/2019 Chemotherapy   The patient had ado-trastuzumab emtansine (KADCYLA) 140 mg in sodium chloride 0.9 % 250 mL chemo infusion, 2.4 mg/kg = 140 mg (100 % of original dose 2.4 mg/kg), Intravenous, Once, 8 of 9 cycles Dose modification: 2.4 mg/kg (original dose 2.4 mg/kg, Cycle 1, Reason: Patient Age), 3 mg/kg (original dose 2.4 mg/kg, Cycle 3, Reason: Provider Judgment) Administration: 140 mg (03/12/2019), 180 mg (04/23/2019), 180 mg (06/18/2019), 180 mg (04/02/2019), 180 mg (05/28/2019), 180 mg (07/09/2019), 180 mg (07/30/2019), 180 mg (08/20/2019)  for chemotherapy treatment.    10/22/2019 -  Chemotherapy   The patient had palonosetron (ALOXI) injection 0.25 mg, 0.25 mg, Intravenous,  Once, 11 of 12 cycles Administration: 0.25 mg (10/22/2019), 0.25 mg (11/12/2019), 0.25 mg (01/14/2020), 0.25 mg (02/04/2020), 0.25 mg (12/03/2019), 0.25 mg (12/24/2019), 0.25 mg (02/27/2020), 0.25 mg (03/19/2020), 0.25 mg (04/09/2020), 0.25 mg (04/29/2020), 0.25 mg (05/20/2020) fam-trastuzumab deruxtecan-nxki (ENHERTU) 168 mg in dextrose 5 % 100 mL chemo infusion, 3.2 mg/kg = 168 mg (100 % of original dose 3.2 mg/kg), Intravenous,  Once, 11 of 12 cycles Dose modification: 3.2 mg/kg (original dose 3.2 mg/kg, Cycle 1, Reason: Provider Judgment), 3.2 mg/kg (original dose 3.2 mg/kg, Cycle 2, Reason: Patient Age), 3.2 mg/kg (original dose 3.2 mg/kg, Cycle 5, Reason: Patient Age), 3.2 mg/kg (original dose 3.2 mg/kg, Cycle 3, Reason: Provider Judgment), 3.2 mg/kg (original dose 3.2 mg/kg, Cycle 4, Reason: Provider Judgment) Administration: 168 mg (10/22/2019), 168 mg (11/12/2019), 168 mg (01/14/2020), 168 mg (02/04/2020), 168 mg (12/03/2019), 168 mg (12/24/2019), 168 mg (02/27/2020), 168 mg (03/19/2020), 168 mg (  04/09/2020), 168 mg (04/29/2020), 168 mg (05/20/2020)  for chemotherapy treatment.      CANCER STAGING: Cancer Staging No matching staging information was found for the patient.  INTERVAL HISTORY:  [name], a 86 y.o. female, returns for routine follow-up and consideration for next cycle of chemotherapy. Carol Duarte was last seen on 05/20/2020.  Due for cycle #12 of Enhertu tomorrow.   Overall, she tells me she has been feeling pretty well. She had 2 episodes of pain in her left armpit since her last visit, the last one being last week. She felt fatigued for 4 days after the last treatment and was too weak to ambulate even with her walker, but afterwards felt much better. Her appetite is good, though she does not drink enough water.  She received her flu shot on 10/11.  Overall, she feels ready for next cycle of chemo tomorrow.    REVIEW OF SYSTEMS:  Review of Systems  Constitutional: Positive for appetite change (75%) and fatigue (50%).  All other systems reviewed and are negative.   PAST MEDICAL/SURGICAL HISTORY:  Past Medical History:  Diagnosis Date  . Cancer ([place])    left breast  . History of kidney stones   . Hypercholesteremia   . Hypertension    Past Surgical History:  Procedure Laterality Date  . APPENDECTOMY    . brain cyst removed  2011  . BREAST BIOPSY Left 03/28/2018   Procedure: BREAST BIOPSY;  Surgeon: [name], MD;  Location: [facility];  Service: General;  Laterality: Left;  . CATARACT EXTRACTION W/PHACO Left 11/09/2015   Procedure: CATARACT EXTRACTION PHACO AND INTRAOCULAR LENS PLACEMENT LEFT EYE cde=8.97;  Surgeon: [name], MD;  Location: [facility];  Service: Ophthalmology;  Laterality: Left;  . CATARACT EXTRACTION W/PHACO Right 02/04/2019   Procedure: CATARACT EXTRACTION PHACO AND INTRAOCULAR LENS PLACEMENT (IOC);  Surgeon: [name], MD;  Location: [facility];  Service: Ophthalmology;  Laterality: Right;  CDE: 15.19  . EYE SURGERY     KPE left  . MASTECTOMY MODIFIED RADICAL Left 05/28/2018   Procedure: LEFT MODIFIED RADICAL MASTECTOMY;  Surgeon: [name], MD;  Location: [facility];  Service: General;  Laterality: Left;  Marland Kitchen MASTECTOMY, PARTIAL Left 04/24/2017    Procedure: MASTECTOMY PARTIAL;  Surgeon: [name], MD;  Location: [facility];  Service: General;  Laterality: Left;  . ORIF FEMUR FRACTURE Left 09/03/2019   Procedure: OPEN REDUCTION INTERNAL FIXATION (ORIF) LEFT HIP FEMUR FRACTURE;  Surgeon: Paralee Cancel, MD;  Location: [facility];  Service: Orthopedics;  Laterality: Left;  . PORTACATH PLACEMENT Right 08/08/2018   Procedure: INSERTION PORT-A-CATH;  Surgeon: [name], MD;  Location: [facility];  Service: General;  Laterality: Right;  . TONSILLECTOMY    . TONSILLECTOMY AND ADENOIDECTOMY      SOCIAL HISTORY:  Social History   Socioeconomic History  . Marital status: Widowed    Spouse name: Not on file  . Number of children: Not on file  . Years of education: Not on file  . Highest education level: Not on file  Occupational History  . Not on file  Tobacco Use  . Smoking status: Never Smoker  . Smokeless tobacco: Never Used  Vaping Use  . Vaping Use: Never used  Substance and Sexual Activity  . Alcohol use: No  . Drug use: No  . Sexual activity: Not Currently    Partners: Male  Other Topics Concern  . Not on file  Social History Narrative  . Not on file  Social Determinants of Health   Financial Resource Strain:   . Difficulty of Paying Living Expenses: Not on file  Food Insecurity:   . Worried About Charity fundraiser in the Last Year: Not on file  . Ran Out of Food in the Last Year: Not on file  Transportation Needs:   . Lack of Transportation (Medical): Not on file  . Lack of Transportation (Non-Medical): Not on file  Physical Activity:   . Days of Exercise per Week: Not on file  . Minutes of Exercise per Session: Not on file  Stress:   . Feeling of Stress : Not on file  Social Connections:   . Frequency of Communication with Friends and Family: Not on file  . Frequency of Social Gatherings with Friends and Family: Not on file  . Attends Religious Services: Not on file  . Active Member of Clubs or Organizations:  Not on file  . Attends Archivist Meetings: Not on file  . Marital Status: Not on file  Intimate Partner Violence:   . Fear of Current or Ex-Partner: Not on file  . Emotionally Abused: Not on file  . Physically Abused: Not on file  . Sexually Abused: Not on file    FAMILY HISTORY:  Family History  Problem Relation Age of Onset  . Heart failure Mother   . Heart failure Father   . Congestive Heart Failure Brother   . Tuberculosis Paternal Uncle   . Tuberculosis Maternal Grandmother   . Tuberculosis Maternal Grandfather   . Congestive Heart Failure Brother     CURRENT MEDICATIONS:  Current Outpatient Medications  Medication Sig Dispense Refill  . Ado-Trastuzumab Emtansine (KADCYLA IV) Inject into the vein every 21 ( twenty-one) days.    Marland Kitchen amLODipine (NORVASC) 5 MG tablet Take 5 mg by mouth daily.    . ferrous sulfate 325 (65 FE) MG tablet Take 325 mg by mouth daily with breakfast.    . labetalol (NORMODYNE) 100 MG tablet Take 1 tablet (100 mg total) by mouth 2 (two) times daily. 60 tablet 12  . oxybutynin (DITROPAN) 5 MG tablet Take 5 mg by mouth daily.     Marland Kitchen PREVIDENT 5000 BOOSTER PLUS 1.1 % PSTE APPLY WITH TOOTHBRUSH AT BEDTIME AS DIRECTED.    Marland Kitchen simvastatin (ZOCOR) 40 MG tablet Take 40 mg by mouth daily.    . temazepam (RESTORIL) 30 MG capsule Take 1 capsule (30 mg total) by mouth at bedtime as needed for sleep. May increase dose to 2 capsules if 1 capsule does not help. 90 capsule 1   No current facility-administered medications for this visit.    ALLERGIES:  Allergies  Allergen Reactions  . Contrast Media [Iodinated Diagnostic Agents] Diarrhea    PHYSICAL EXAM:  Performance status (ECOG): 2 - Symptomatic, <50% confined to bed  Vitals:   06/10/20 1228  BP: (!) 125/54  Pulse: (!) 52  Resp: 16  Temp: (!) 97.2 F (36.2 C)  SpO2: 97%   Wt Readings from Last 3 Encounters:  06/10/20 110 lb 12.8 oz (50.3 kg)  05/20/20 108 lb 14.5 oz (49.4 kg)  04/29/20 108  lb 12.8 oz (49.4 kg)   Physical Exam Vitals reviewed.  Constitutional:      Appearance: Normal appearance.  Cardiovascular:     Rate and Rhythm: Normal rate and regular rhythm.     Pulses: Normal pulses.     Heart sounds: Normal heart sounds.  Pulmonary:     Effort: Pulmonary effort is  normal.     Breath sounds: Normal breath sounds.  Chest:     Comments: Port-a-Cath in R chest Lymphadenopathy:     Upper Body:     Left upper body: No axillary or pectoral adenopathy.  Neurological:     General: No focal deficit present.     Mental Status: She is alert and oriented to person, place, and time.  Psychiatric:        Mood and Affect: Mood normal.        Behavior: Behavior normal.     LABORATORY DATA:  I have reviewed the labs as listed.  CBC Latest Ref Rng & Units 06/10/2020 05/20/2020 04/29/2020  WBC 4.0 - 10.5 K/uL 4.1 3.9(L) 4.2  Hemoglobin 12.0 - 15.0 g/dL 11.1(L) 11.1(L) 11.0(L)  Hematocrit 36 - 46 % 33.8(L) 35.1(L) 34.2(L)  Platelets 150 - 400 K/uL 162 180 181   CMP Latest Ref Rng & Units 06/10/2020 05/20/2020 04/29/2020  Glucose 70 - 99 mg/dL 145(H) 127(H) 112(H)  BUN 8 - 23 mg/dL 22 28(H) 23  Creatinine 0.44 - 1.00 mg/dL 1.53(H) 1.05(H) 1.04(H)  Sodium 135 - 145 mmol/L 141 140 141  Potassium 3.5 - 5.1 mmol/L 3.9 3.9 4.1  Chloride 98 - 111 mmol/L 107 108 107  CO2 22 - 32 mmol/L _0 Calcium 8.9 - 10.3 mg/dL 8.9 9.2 9.1  Total Protein 6.5 - 8.1 g/dL 5.5(L) 5.7(L) 5.9(L)  Total Bilirubin 0.3 - 1.2 mg/dL 0.6 0.6 0.7  Alkaline Phos 38 - 126 U/L 51 52 49  AST 15 - 41 U/L _1 ALT 0 - 44 U/L _2 Lab Results  Component Value Date   LDH 128 06/10/2020   LDH 137 03/19/2020   LDH 132 02/27/2020    DIAGNOSTIC IMAGING:  I have independently reviewed the scans and discussed with the patient. ECHOCARDIOGRAM COMPLETE  Result Date: 06/08/2020    ECHOCARDIOGRAM REPORT   Patient Name:   Carol Duarte Date of Exam: 06/08/2020 Medical Rec #:  [id number]         Height:       61.0 in Accession #:    [id number]       Weight:       108.9 lb Date of Birth:  Feb 21, 1925         BSA:          1.459 m Patient Age:    89 years         BP:           123/65 mmHg Patient Gender: F                HR:           48 bpm. Exam Location:  [facility] Procedure: 2D Echo, Cardiac Doppler and Color Doppler Indications:    Chemotherapy evaluation v87.41 / v58.11  History:        Patient has prior history of Echocardiogram examinations, most                 recent 02/24/2020. Risk Factors:Hypertension and Dyslipidemia.                 Malignant neoplasm of lower-outer quadrant of left female                 breast- S/P left mastectomy.  Sonographer:    Alvino Chapel RCS Referring Phys: [phone] [place]  1. Left ventricular ejection fraction, by estimation,  is 65 to 70%. The left ventricle has normal function. The left ventricle has no regional Martes motion abnormalities. Left ventricular diastolic parameters were normal.  2. Right ventricular systolic function is normal. The right ventricular size is normal.  3. Left atrial size was mild to moderately dilated.  4. The mitral valve is normal in structure. Mild mitral valve regurgitation.  5. The aortic valve is normal in structure. Aortic valve regurgitation is not visualized. Comparison(s): The left ventricular function is unchanged. FINDINGS  Left Ventricle: Left ventricular ejection fraction, by estimation, is 65 to 70%. The left ventricle has normal function. The left ventricle has no regional Alfieri motion abnormalities. The left ventricular internal cavity size was normal in size. There is  no left ventricular hypertrophy. Left ventricular diastolic parameters were normal. Right Ventricle: The right ventricular size is normal. Right vetricular Niemeier thickness was not assessed. Right ventricular systolic function is normal. Left Atrium: Left atrial size was mild to moderately dilated. Right Atrium: Right atrial size was normal  in size. Pericardium: There is no evidence of pericardial effusion. Mitral Valve: The mitral valve is normal in structure. Mild mitral valve regurgitation. Tricuspid Valve: The tricuspid valve is normal in structure. Tricuspid valve regurgitation is mild. Aortic Valve: The aortic valve is normal in structure. Aortic valve regurgitation is not visualized. Pulmonic Valve: The pulmonic valve was normal in structure. Pulmonic valve regurgitation is not visualized. Aorta: The aortic root is normal in size and structure. IAS/Shunts: No atrial level shunt detected by color flow Doppler.  LEFT VENTRICLE PLAX 2D LVIDd:         4.23 cm     Diastology LVIDs:         2.34 cm     LV e' medial:    6.31 cm/s LV PW:         0.77 cm     LV E/e' medial:  19.0 LV IVS:        0.85 cm     LV e' lateral:   6.31 cm/s LVOT diam:     1.60 cm     LV E/e' lateral: 19.0 LV SV:         75 LV SV Index:   51 LVOT Area:     2.01 cm  LV Volumes (MOD) LV vol d, MOD A2C: 38.2 ml LV vol d, MOD A4C: 57.9 ml LV vol s, MOD A2C: 20.6 ml LV vol s, MOD A4C: 19.5 ml LV SV MOD A2C:     17.6 ml LV SV MOD A4C:     57.9 ml LV SV MOD BP:      27.5 ml RIGHT VENTRICLE RV S prime:     12.10 cm/s TAPSE (M-mode): 2.3 cm LEFT ATRIUM             Index       RIGHT ATRIUM           Index LA diam:        3.55 cm 2.43 cm/m  RA Area:     13.50 cm LA Vol (A2C):   41.8 ml 28.65 ml/m RA Volume:   28.20 ml  19.33 ml/m LA Vol (A4C):   57.5 ml 39.42 ml/m LA Biplane Vol: 50.0 ml 34.28 ml/m  AORTIC VALVE LVOT Vmax:   123.00 cm/s LVOT Vmean:  88.700 cm/s LVOT VTI:    0.373 m  AORTA Ao Root diam: 3.00 cm MITRAL VALVE MV Area (PHT): 3.43 cm     SHUNTS MV Decel  Time: 221 msec     Systemic VTI:  0.37 m MV E velocity: 120.00 cm/s  Systemic Diam: 1.60 cm MV A velocity: 104.00 cm/s MV E/A ratio:  1.15 Dorris Carnes MD Electronically signed by Dorris Carnes MD Signature Date/Time: 06/08/2020/9:55:52 PM    Final      ASSESSMENT:  1. Metastatic HER-2 positive left breast  cancer: -Enhertu started on 10/22/2019. -CT CAP on 01/10/2020 showed response to therapy with decreasing lung nodules as well as supraclavicular and mediastinal adenopathy with no new evidence of metastatic disease. -CA 15-3 of 6.4 on 02/27/2020. -CT CAP on 04/08/2020 shows more or less stable lung and liver lesions. Left infrahilar mass measures 5.6 x 5.3 cm, previously 5.4 x 5.4 cm. No new areas were seen. Small dependent bilateral pleural effusions are new. -We will do nextCT scan WITHOUT PO CONTRAST.  2. High risk drug monitoring: -Echo on 11/12/2019, EF 60-65%. -Echo on 02/24/2020 with EF 60-65%. -2D echo on 06/08/2020 with EF 65 to 70%.   PLAN:  1. Metastatic HER-2 positive left breast cancer: -After her last dose of Enhertu, she had severe weakness for 4 days.  It has completely resolved. -She had 2 episodes of pain in the left axillary region.  No mass palpable. -Reviewed her labs.  Creatinine increased to 1.53.  LFTs were grossly normal although albumin is low at 3.2.  White count and platelet count was normal. -Would hold off on her treatment today.  She will receive 1 L of normal saline over 2 hours. -We will check BMP tomorrow.  If her creatinine come down to her baseline, will proceed with her next cycle. -RTC 3 weeks with repeat PET scan for response evaluation.  2. High risk drug monitoring: -Reviewed results of echocardiogram from 06/08/2020 which was normal.  3.Leg swellings: -Leg swellings have improved.  Albumin is 3.2.  4. Difficulty sleeping: -Continue temazepam as needed which is helping.  5. Weight loss: -Marinol was discontinued as her appetite improved.  Her weight went up by 2 to 3 pounds.   Orders placed this encounter:  No orders of the defined types were placed in this encounter.    Derek Jack, MD [place] [phone]   I, Milinda Antis, am acting as a scribe for Dr. Sanda Linger.  I, Derek Jack MD, have reviewed the above documentation for accuracy and completeness, and I agree with the above.

## 2020-06-10 NOTE — Patient Instructions (Signed)
[facility] at [facility] Discharge Instructions  Labs drawn from portacath today   Thank you for choosing [facility] at [facility] to provide your oncology and hematology care.  To afford each patient quality time with our provider, please arrive at least 15 minutes before your scheduled appointment time.   If you have a lab appointment with the Cancer Center please come in thru the Main Entrance and check in at the main information desk.  You need to re-schedule your appointment should you arrive 10 or more minutes late.  We strive to give you quality time with our providers, and arriving late affects you and other patients whose appointments are after yours.  Also, if you no show three or more times for appointments you may be dismissed from the clinic at the providers discretion.     Again, thank you for choosing [facility].  Our hope is that these requests will decrease the amount of time that you wait before being seen by our physicians.       _____________________________________________________________  Should you have questions after your visit to [facility], please contact our office at [phone] and follow the prompts.  Our office hours are 8:00 a.m. and 4:30 p.m. Monday - Friday.  Please note that voicemails left after 4:00 p.m. may not be returned until the following business day.  We are closed weekends and major holidays.  You do have access to a nurse 24-7, just call the main number to the clinic [phone] and do not press any options, hold on the line and a nurse will answer the phone.    For prescription refill requests, have your pharmacy contact our office and allow 72 hours.    Due to Covid, you will need to wear a mask upon entering the hospital. If you do not have a mask, a mask will be given to you at the Main Entrance upon arrival. For doctor visits, patients may have 1 support person age 18  or older with them. For treatment visits, patients can not have anyone with them due to social distancing guidelines and our immunocompromised population.     

## 2020-06-10 NOTE — Progress Notes (Signed)
Patient was assessed by Dr. Delton Coombes and labs have been reviewed.  Patient's creatinine 1.53 today.  We will hold treatment.  She will get normal saline 1 liter over 2 hours. She will come back tomorrow, recheck Basic Metabolic Panel and then possible treatment tomorrow. Primary RN and pharmacy aware.

## 2020-06-10 NOTE — Patient Instructions (Signed)
[facility] at [facility] Discharge Instructions  You were seen today by Dr. Delton Coombes. He went over your recent results. You received fluids today and will receive your treatment tomorrow. You will be scheduled for a PET scan before your next visit. Dr. Delton Coombes will see you back in 3 weeks for labs and follow up.   Thank you for choosing [facility] at [facility] to provide your oncology and hematology care.  To afford each patient quality time with our provider, please arrive at least 15 minutes before your scheduled appointment time.   If you have a lab appointment with the [facility] please come in thru the Main Entrance and check in at the main information desk  You need to re-schedule your appointment should you arrive 10 or more minutes late.  We strive to give you quality time with our providers, and arriving late affects you and other patients whose appointments are after yours.  Also, if you no show three or more times for appointments you may be dismissed from the clinic at the providers discretion.     Again, thank you for choosing [facility].  Our hope is that these requests will decrease the amount of time that you wait before being seen by our physicians.       _____________________________________________________________  Should you have questions after your visit to [facility], please contact our office at [phone] between the hours of 8:00 a.m. and 4:30 p.m.  Voicemails left after 4:00 p.m. will not be returned until the following business day.  For prescription refill requests, have your pharmacy contact our office and allow 72 hours.    Cancer Center Support Programs:   > Cancer Support Group  2nd Tuesday of the month 1pm-2pm, Journey Room

## 2020-06-11 ENCOUNTER — Inpatient Hospital Stay (HOSPITAL_COMMUNITY): Payer: Medicare Other

## 2020-06-11 ENCOUNTER — Other Ambulatory Visit (HOSPITAL_COMMUNITY): Payer: Self-pay | Admitting: *Deleted

## 2020-06-11 VITALS — BP 130/48 | HR 54 | Temp 96.6°F | Resp 18

## 2020-06-11 DIAGNOSIS — R5383 Other fatigue: Secondary | ICD-10-CM | POA: Diagnosis not present

## 2020-06-11 DIAGNOSIS — C50512 Malignant neoplasm of lower-outer quadrant of left female breast: Secondary | ICD-10-CM | POA: Diagnosis not present

## 2020-06-11 DIAGNOSIS — Z79899 Other long term (current) drug therapy: Secondary | ICD-10-CM | POA: Diagnosis not present

## 2020-06-11 DIAGNOSIS — Z5112 Encounter for antineoplastic immunotherapy: Secondary | ICD-10-CM | POA: Diagnosis not present

## 2020-06-11 DIAGNOSIS — C787 Secondary malignant neoplasm of liver and intrahepatic bile duct: Secondary | ICD-10-CM | POA: Diagnosis not present

## 2020-06-11 DIAGNOSIS — C50912 Malignant neoplasm of unspecified site of left female breast: Secondary | ICD-10-CM

## 2020-06-11 DIAGNOSIS — Z836 Family history of other diseases of the respiratory system: Secondary | ICD-10-CM | POA: Diagnosis not present

## 2020-06-11 DIAGNOSIS — Z8249 Family history of ischemic heart disease and other diseases of the circulatory system: Secondary | ICD-10-CM | POA: Diagnosis not present

## 2020-06-11 DIAGNOSIS — M79622 Pain in left upper arm: Secondary | ICD-10-CM | POA: Diagnosis not present

## 2020-06-11 DIAGNOSIS — R531 Weakness: Secondary | ICD-10-CM | POA: Diagnosis not present

## 2020-06-11 LAB — BASIC METABOLIC PANEL
Anion gap: 8 (ref 5–15)
BUN: 20 mg/dL (ref 8–23)
CO2: 24 mmol/L (ref 22–32)
Calcium: 8.6 mg/dL — ABNORMAL LOW (ref 8.9–10.3)
Chloride: 109 mmol/L (ref 98–111)
Creatinine, Ser: 1.03 mg/dL — ABNORMAL HIGH (ref 0.44–1.00)
GFR, Estimated: 47 mL/min — ABNORMAL LOW (ref >60)
Glucose, Bld: 142 mg/dL — ABNORMAL HIGH (ref 70–99)
Potassium: 3.5 mmol/L (ref 3.5–5.1)
Sodium: 141 mmol/L (ref 135–145)

## 2020-06-11 LAB — CANCER ANTIGEN 15-3: CA 15-3: 7.8 U/mL (ref 0.0–25.0)

## 2020-06-11 MED ORDER — SODIUM CHLORIDE 0.9 % IV SOLN
10.0000 mg | Freq: Once | INTRAVENOUS | Status: AC
  Administered 2020-06-11: 10 mg via INTRAVENOUS
  Filled 2020-06-11: qty 10

## 2020-06-11 MED ORDER — DIPHENHYDRAMINE HCL 25 MG PO CAPS
25.0000 mg | ORAL_CAPSULE | Freq: Once | ORAL | Status: AC
  Administered 2020-06-11: 25 mg via ORAL
  Filled 2020-06-11: qty 1

## 2020-06-11 MED ORDER — ACETAMINOPHEN 325 MG PO TABS
ORAL_TABLET | ORAL | Status: AC
  Filled 2020-06-11: qty 2

## 2020-06-11 MED ORDER — ACETAMINOPHEN 325 MG PO TABS
650.0000 mg | ORAL_TABLET | Freq: Once | ORAL | Status: AC
  Administered 2020-06-11: 650 mg via ORAL
  Filled 2020-06-11: qty 2

## 2020-06-11 MED ORDER — DEXTROSE 5 % IV SOLN: Freq: Once | INTRAVENOUS | Status: AC

## 2020-06-11 MED ORDER — SODIUM CHLORIDE 0.9% FLUSH
10.0000 mL | INTRAVENOUS | Status: DC | PRN
  Administered 2020-06-11: 10 mL

## 2020-06-11 MED ORDER — HEPARIN SOD (PORK) LOCK FLUSH 100 UNIT/ML IV SOLN
500.0000 [IU] | Freq: Once | INTRAVENOUS | Status: AC | PRN
  Administered 2020-06-11: 500 [IU]

## 2020-06-11 MED ORDER — PALONOSETRON HCL INJECTION 0.25 MG/5ML
0.2500 mg | Freq: Once | INTRAVENOUS | Status: AC
  Administered 2020-06-11: 0.25 mg via INTRAVENOUS
  Filled 2020-06-11: qty 5

## 2020-06-11 MED ORDER — ALPRAZOLAM 0.5 MG PO TABS: 0.5000 mg | ORAL_TABLET | Freq: Once | ORAL | 0 refills | Status: AC

## 2020-06-11 MED ORDER — FAM-TRASTUZUMAB DERUXTECAN-NXKI CHEMO 100 MG IV SOLR
3.2000 mg/kg | Freq: Once | INTRAVENOUS | Status: AC
  Administered 2020-06-11: 168 mg via INTRAVENOUS
  Filled 2020-06-11: qty 8.4

## 2020-06-11 NOTE — Progress Notes (Signed)
Treatment given per orders. Patient tolerated it well without problems. Vitals stable and discharged home from clinic via wheelchair in stable condition.  Follow up as scheduled.  

## 2020-06-11 NOTE — Progress Notes (Signed)
Labs reviewed with MD. Madaline Brilliant to treat today per MD.

## 2020-06-11 NOTE — Patient Instructions (Signed)
[facility] Discharge Instructions for Patients Receiving Chemotherapy  Today you received the following chemotherapy agents   To help prevent nausea and vomiting after your treatment, we encourage you to take your nausea medication   If you develop nausea and vomiting that is not controlled by your nausea medication, call the clinic.   BELOW ARE SYMPTOMS THAT SHOULD BE REPORTED IMMEDIATELY:  *FEVER GREATER THAN 100.5 F  *CHILLS WITH OR WITHOUT FEVER  NAUSEA AND VOMITING THAT IS NOT CONTROLLED WITH YOUR NAUSEA MEDICATION  *UNUSUAL SHORTNESS OF BREATH  *UNUSUAL BRUISING OR BLEEDING  TENDERNESS IN MOUTH AND THROAT WITH OR WITHOUT PRESENCE OF ULCERS  *URINARY PROBLEMS  *BOWEL PROBLEMS  UNUSUAL RASH Items with * indicate a potential emergency and should be followed up as soon as possible.  Feel free to call the clinic should you have any questions or concerns. The clinic phone number is [phone].  Please show the CHEMO ALERT CARD at check-in to the Emergency Department and triage nurse.   

## 2020-06-22 ENCOUNTER — Encounter (HOSPITAL_COMMUNITY)
Admission: RE | Admit: 2020-06-22 | Discharge: 2020-06-22 | Disposition: A | Discharge status: Home / Self Care | Payer: Medicare Other | Source: Ambulatory Visit | Attending: Hematology | Admitting: Hematology

## 2020-06-22 ENCOUNTER — Other Ambulatory Visit: Payer: Self-pay

## 2020-06-22 DIAGNOSIS — C50919 Malignant neoplasm of unspecified site of unspecified female breast: Secondary | ICD-10-CM | POA: Diagnosis not present

## 2020-06-22 DIAGNOSIS — C50912 Malignant neoplasm of unspecified site of left female breast: Secondary | ICD-10-CM | POA: Diagnosis not present

## 2020-06-22 DIAGNOSIS — J90 Pleural effusion, not elsewhere classified: Secondary | ICD-10-CM | POA: Diagnosis not present

## 2020-06-22 DIAGNOSIS — I2510 Atherosclerotic heart disease of native coronary artery without angina pectoris: Secondary | ICD-10-CM | POA: Diagnosis not present

## 2020-06-22 DIAGNOSIS — M47816 Spondylosis without myelopathy or radiculopathy, lumbar region: Secondary | ICD-10-CM | POA: Diagnosis not present

## 2020-06-22 MED ORDER — FLUDEOXYGLUCOSE F - 18 (FDG) INJECTION
7.7700 | Freq: Once | INTRAVENOUS | Status: AC | PRN
  Administered 2020-06-22: 7.77 via INTRAVENOUS

## 2020-07-02 ENCOUNTER — Inpatient Hospital Stay (HOSPITAL_COMMUNITY): Payer: Medicare Other

## 2020-07-02 ENCOUNTER — Inpatient Hospital Stay (HOSPITAL_BASED_OUTPATIENT_CLINIC_OR_DEPARTMENT_OTHER): Payer: Medicare Other | Admitting: Hematology

## 2020-07-02 ENCOUNTER — Other Ambulatory Visit: Payer: Self-pay

## 2020-07-02 ENCOUNTER — Inpatient Hospital Stay (HOSPITAL_COMMUNITY): Payer: Medicare Other | Attending: Hematology

## 2020-07-02 VITALS — BP 130/55 | HR 49 | Temp 96.7°F | Resp 16 | Wt 108.2 lb

## 2020-07-02 DIAGNOSIS — I2510 Atherosclerotic heart disease of native coronary artery without angina pectoris: Secondary | ICD-10-CM | POA: Diagnosis not present

## 2020-07-02 DIAGNOSIS — M79622 Pain in left upper arm: Secondary | ICD-10-CM | POA: Insufficient documentation

## 2020-07-02 DIAGNOSIS — M47816 Spondylosis without myelopathy or radiculopathy, lumbar region: Secondary | ICD-10-CM | POA: Insufficient documentation

## 2020-07-02 DIAGNOSIS — Z79899 Other long term (current) drug therapy: Secondary | ICD-10-CM | POA: Insufficient documentation

## 2020-07-02 DIAGNOSIS — M5136 Other intervertebral disc degeneration, lumbar region: Secondary | ICD-10-CM | POA: Diagnosis not present

## 2020-07-02 DIAGNOSIS — E785 Hyperlipidemia, unspecified: Secondary | ICD-10-CM | POA: Insufficient documentation

## 2020-07-02 DIAGNOSIS — Z5112 Encounter for antineoplastic immunotherapy: Secondary | ICD-10-CM | POA: Diagnosis not present

## 2020-07-02 DIAGNOSIS — I10 Essential (primary) hypertension: Secondary | ICD-10-CM | POA: Insufficient documentation

## 2020-07-02 DIAGNOSIS — R110 Nausea: Secondary | ICD-10-CM | POA: Insufficient documentation

## 2020-07-02 DIAGNOSIS — Z8249 Family history of ischemic heart disease and other diseases of the circulatory system: Secondary | ICD-10-CM | POA: Diagnosis not present

## 2020-07-02 DIAGNOSIS — M7989 Other specified soft tissue disorders: Secondary | ICD-10-CM | POA: Diagnosis not present

## 2020-07-02 DIAGNOSIS — C50912 Malignant neoplasm of unspecified site of left female breast: Secondary | ICD-10-CM

## 2020-07-02 DIAGNOSIS — C50512 Malignant neoplasm of lower-outer quadrant of left female breast: Secondary | ICD-10-CM | POA: Insufficient documentation

## 2020-07-02 DIAGNOSIS — Z836 Family history of other diseases of the respiratory system: Secondary | ICD-10-CM | POA: Insufficient documentation

## 2020-07-02 DIAGNOSIS — C7802 Secondary malignant neoplasm of left lung: Secondary | ICD-10-CM | POA: Diagnosis not present

## 2020-07-02 DIAGNOSIS — I700 Atherosclerosis of aorta: Secondary | ICD-10-CM | POA: Insufficient documentation

## 2020-07-02 DIAGNOSIS — R197 Diarrhea, unspecified: Secondary | ICD-10-CM | POA: Insufficient documentation

## 2020-07-02 DIAGNOSIS — R5383 Other fatigue: Secondary | ICD-10-CM | POA: Diagnosis not present

## 2020-07-02 LAB — CBC WITH DIFFERENTIAL/PLATELET
Abs Immature Granulocytes: 0.01 10*3/uL (ref 0.00–0.07)
Basophils Absolute: 0 10*3/uL (ref 0.0–0.1)
Basophils Relative: 1 %
Eosinophils Absolute: 0.1 10*3/uL (ref 0.0–0.5)
Eosinophils Relative: 2 %
HCT: 35 % — ABNORMAL LOW (ref 36.0–46.0)
Hemoglobin: 11.3 g/dL — ABNORMAL LOW (ref 12.0–15.0)
Immature Granulocytes: 0 %
Lymphocytes Relative: 19 %
Lymphs Abs: 0.9 10*3/uL (ref 0.7–4.0)
MCH: 33.5 pg (ref 26.0–34.0)
MCHC: 32.3 g/dL (ref 30.0–36.0)
MCV: 103.9 fL — ABNORMAL HIGH (ref 80.0–100.0)
Monocytes Absolute: 0.4 10*3/uL (ref 0.1–1.0)
Monocytes Relative: 8 %
Neutro Abs: 3.2 10*3/uL (ref 1.7–7.7)
Neutrophils Relative %: 70 %
Platelets: 191 10*3/uL (ref 150–400)
RBC: 3.37 MIL/uL — ABNORMAL LOW (ref 3.87–5.11)
RDW: 14.3 % (ref 11.5–15.5)
WBC: 4.5 10*3/uL (ref 4.0–10.5)
nRBC: 0 % (ref 0.0–0.2)

## 2020-07-02 LAB — COMPREHENSIVE METABOLIC PANEL
ALT: 18 U/L (ref 0–44)
AST: 23 U/L (ref 15–41)
Albumin: 3.6 g/dL (ref 3.5–5.0)
Alkaline Phosphatase: 50 U/L (ref 38–126)
Anion gap: 9 (ref 5–15)
BUN: 22 mg/dL (ref 8–23)
CO2: 22 mmol/L (ref 22–32)
Calcium: 9.1 mg/dL (ref 8.9–10.3)
Chloride: 107 mmol/L (ref 98–111)
Creatinine, Ser: 1.07 mg/dL — ABNORMAL HIGH (ref 0.44–1.00)
GFR, Estimated: 48 mL/min — ABNORMAL LOW (ref >60)
Glucose, Bld: 112 mg/dL — ABNORMAL HIGH (ref 70–99)
Potassium: 4.2 mmol/L (ref 3.5–5.1)
Sodium: 138 mmol/L (ref 135–145)
Total Bilirubin: 0.6 mg/dL (ref 0.3–1.2)
Total Protein: 6 g/dL — ABNORMAL LOW (ref 6.5–8.1)

## 2020-07-02 LAB — LACTATE DEHYDROGENASE: LDH: 139 U/L (ref 98–192)

## 2020-07-02 LAB — MAGNESIUM: Magnesium: 2.2 mg/dL (ref 1.7–2.4)

## 2020-07-02 NOTE — Patient Instructions (Signed)
[place] at [facility] Discharge Instructions  You were seen today by Dr. Delton Coombes. He went over your recent results. You will receive treatment next week. If your left armpit pain becomes unbearable, please let Dr. Delton Coombes know and he will refer you to radiation oncology and have the lymph node irradiated. Dr. Delton Coombes will see you back in 4 weeks for labs and follow up.   Thank you for choosing [place] at [facility] to provide your oncology and hematology care.  To afford each patient quality time with our provider, please arrive at least 15 minutes before your scheduled appointment time.   If you have a lab appointment with the [place] please come in thru the Main Entrance and check in at the main information desk  You need to re-schedule your appointment should you arrive 10 or more minutes late.  We strive to give you quality time with our providers, and arriving late affects you and other patients whose appointments are after yours.  Also, if you no show three or more times for appointments you may be dismissed from the clinic at the providers discretion.     Again, thank you for choosing [facility].  Our hope is that these requests will decrease the amount of time that you wait before being seen by our physicians.       _____________________________________________________________  Should you have questions after your visit to [facility], please contact our office at [phone] between the hours of 8:00 a.m. and 4:30 p.m.  Voicemails left after 4:00 p.m. will not be returned until the following business day.  For prescription refill requests, have your pharmacy contact our office and allow 72 hours.    Cancer Center Support Programs:   > Cancer Support Group  2nd Tuesday of the month 1pm-2pm, Journey Room

## 2020-07-02 NOTE — Progress Notes (Signed)
Per Dr. Delton Coombes, we will hold treatment until next week. Pt is having some diarrhea and would like for treatment to be held today. Daughter, Pam, made aware of changes in schedule and that pt is ready to be picked up. Pt discharged in satisfactory condition with follow up instructions.

## 2020-07-02 NOTE — Patient Instructions (Signed)
[facility] at [facility] Discharge Instructions  Labs drawn from portacath today   Thank you for choosing [facility] at [facility] to provide your oncology and hematology care.  To afford each patient quality time with our provider, please arrive at least 15 minutes before your scheduled appointment time.   If you have a lab appointment with the Cancer Center please come in thru the Main Entrance and check in at the main information desk.  You need to re-schedule your appointment should you arrive 10 or more minutes late.  We strive to give you quality time with our providers, and arriving late affects you and other patients whose appointments are after yours.  Also, if you no show three or more times for appointments you may be dismissed from the clinic at the providers discretion.     Again, thank you for choosing [facility].  Our hope is that these requests will decrease the amount of time that you wait before being seen by our physicians.       _____________________________________________________________  Should you have questions after your visit to [facility], please contact our office at [phone] and follow the prompts.  Our office hours are 8:00 a.m. and 4:30 p.m. Monday - Friday.  Please note that voicemails left after 4:00 p.m. may not be returned until the following business day.  We are closed weekends and major holidays.  You do have access to a nurse 24-7, just call the main number to the clinic [phone] and do not press any options, hold on the line and a nurse will answer the phone.    For prescription refill requests, have your pharmacy contact our office and allow 72 hours.    Due to Covid, you will need to wear a mask upon entering the hospital. If you do not have a mask, a mask will be given to you at the Main Entrance upon arrival. For doctor visits, patients may have 1 support person age 18  or older with them. For treatment visits, patients can not have anyone with them due to social distancing guidelines and our immunocompromised population.     

## 2020-07-02 NOTE — Progress Notes (Signed)
[facility] [address] [place] [address]   CLINIC:  Medical Oncology/Hematology  PCP:  [name], MD [place] [facility] [place] [phone]   REASON FOR VISIT:  Follow-up for invasive ductal carcinoma of left breast  PRIOR THERAPY:  1. Lumpectomy on 04/24/2017. 2. Left modified radical mastectomy on 05/28/2018. 2. Trastuzumab x 10 cycles, pertuzumab x 6 cycles, emtansine x 8 cycles.  NGS Results: Not done  CURRENT THERAPY: Enhertu & Aloxi every 3 weeks  BRIEF ONCOLOGIC HISTORY:  Oncology History  Invasive ductal carcinoma of left breast ([place])  03/30/2017 Initial Diagnosis   Invasive ductal carcinoma of left breast ([place])   08/09/2018 - 02/28/2019 Chemotherapy   The patient had lapatinib (TYKERB) 250 MG tablet, 1,000 mg, Oral, Daily, 1 of 1 cycle, Start date: --, End date: -- trastuzumab (HERCEPTIN) 500 mg in sodium chloride 0.9 % 250 mL chemo infusion, 525 mg, Intravenous,  Once, 10 of 10 cycles Administration: 500 mg (08/09/2018), 350 mg (09/06/2018), 350 mg (09/27/2018), 378 mg (10/18/2018), 378 mg (11/15/2018), 378 mg (12/06/2018), 378 mg (12/28/2018), 378 mg (01/18/2019), 378 mg (02/08/2019) pertuzumab (PERJETA) 840 mg in sodium chloride 0.9 % 250 mL chemo infusion, 840 mg (100 % of original dose 840 mg), Intravenous, Once, 6 of 6 cycles Dose modification: 840 mg (original dose 840 mg, Cycle 5), 420 mg (original dose 420 mg, Cycle 6) Administration: 840 mg (11/15/2018), 420 mg (12/06/2018), 420 mg (12/28/2018), 420 mg (01/18/2019), 420 mg (02/08/2019)  for chemotherapy treatment.    09/18/2018 Genetic Testing   Negative genetic testing for the breast and gyn cancer panel.  The Breast/GYN gene panel offered by [facility] includes sequencing and rearrangement analysis for the following 23 genes:  ATM, BRCA1, BRCA2, BRIP1, CDH1, CHEK2, EPCAM, MLH1, MSH2, MSH6, NBN, NF1, PALB2, PMS2, PTEN, RAD51C, RAD51D, STK11, and TP53.   The report date is  09/18/2018.   03/12/2019 - 08/20/2019 Chemotherapy   The patient had ado-trastuzumab emtansine (KADCYLA) 140 mg in sodium chloride 0.9 % 250 mL chemo infusion, 2.4 mg/kg = 140 mg (100 % of original dose 2.4 mg/kg), Intravenous, Once, 8 of 9 cycles Dose modification: 2.4 mg/kg (original dose 2.4 mg/kg, Cycle 1, Reason: Patient Age), 3 mg/kg (original dose 2.4 mg/kg, Cycle 3, Reason: Provider Judgment) Administration: 140 mg (03/12/2019), 180 mg (04/23/2019), 180 mg (06/18/2019), 180 mg (04/02/2019), 180 mg (05/28/2019), 180 mg (07/09/2019), 180 mg (07/30/2019), 180 mg (08/20/2019)  for chemotherapy treatment.    10/22/2019 -  Chemotherapy   The patient had palonosetron (ALOXI) injection 0.25 mg, 0.25 mg, Intravenous,  Once, 13 of 16 cycles Administration: 0.25 mg (10/22/2019), 0.25 mg (11/12/2019), 0.25 mg (01/14/2020), 0.25 mg (02/04/2020), 0.25 mg (12/03/2019), 0.25 mg (12/24/2019), 0.25 mg (02/27/2020), 0.25 mg (03/19/2020), 0.25 mg (04/09/2020), 0.25 mg (04/29/2020), 0.25 mg (05/20/2020), 0.25 mg (06/11/2020) fam-trastuzumab deruxtecan-nxki (ENHERTU) 168 mg in dextrose 5 % 100 mL chemo infusion, 3.2 mg/kg = 168 mg (100 % of original dose 3.2 mg/kg), Intravenous,  Once, 13 of 16 cycles Dose modification: 3.2 mg/kg (original dose 3.2 mg/kg, Cycle 1, Reason: Provider Judgment), 3.2 mg/kg (original dose 3.2 mg/kg, Cycle 2, Reason: Patient Age), 3.2 mg/kg (original dose 3.2 mg/kg, Cycle 5, Reason: Patient Age), 3.2 mg/kg (original dose 3.2 mg/kg, Cycle 3, Reason: Provider Judgment), 3.2 mg/kg (original dose 3.2 mg/kg, Cycle 4, Reason: Provider Judgment) Administration: 168 mg (10/22/2019), 168 mg (11/12/2019), 168 mg (01/14/2020), 168 mg (02/04/2020), 168 mg (12/03/2019), 168 mg (12/24/2019), 168 mg (02/27/2020), 168 mg (  03/19/2020), 168 mg (04/09/2020), 168 mg (04/29/2020), 168 mg (05/20/2020), 168 mg (06/11/2020)  for chemotherapy treatment.      CANCER STAGING: Cancer Staging No matching staging information was found for the  patient.  INTERVAL HISTORY:  Carol Duarte, a 86 y.o. female, returns for routine follow-up of her invasive ductal carcinoma of left breast. Carol Duarte was last seen on 06/10/2020.   Today she reports feeling well. She reports that she tolerated the previous treatment well. She has been trying to eat every meal daily to maintain her weight. She complains of intermittent pain and sensation of wetness in her left armpit and reports that some days she reports no pain, while on other days it will be constant burning sensation lasting half a day or full day. She takes Tylenol for the pain as needed. She continues to avoid salt when she cooks and will only eat salt if her daughter is cooking. She reports occasional nausea, but denies vomiting. Her energy levels have improved and she is trying to walk without her walker at times.   REVIEW OF SYSTEMS:  Review of Systems  Constitutional: Positive for appetite change (25%) and fatigue (50%).  Gastrointestinal: Positive for diarrhea and nausea. Negative for vomiting.  All other systems reviewed and are negative.   PAST MEDICAL/SURGICAL HISTORY:  Past Medical History:  Diagnosis Date   Cancer [facility])    left breast   History of kidney stones    Hypercholesteremia    Hypertension    Past Surgical History:  Procedure Laterality Date   APPENDECTOMY     brain cyst removed  2011   BREAST BIOPSY Left 03/28/2018   Procedure: BREAST BIOPSY;  Surgeon: [name], MD;  Location: [facility];  Service: General;  Laterality: Left;   CATARACT EXTRACTION W/PHACO Left 11/09/2015   Procedure: CATARACT EXTRACTION PHACO AND INTRAOCULAR LENS PLACEMENT LEFT EYE cde=8.97;  Surgeon: [name], MD;  Location: [facility];  Service: Ophthalmology;  Laterality: Left;   CATARACT EXTRACTION W/PHACO Right 02/04/2019   Procedure: CATARACT EXTRACTION PHACO AND INTRAOCULAR LENS PLACEMENT (IOC);  Surgeon: [name], MD;  Location: [facility];  Service: Ophthalmology;   Laterality: Right;  CDE: 15.19   EYE SURGERY     KPE left   MASTECTOMY MODIFIED RADICAL Left 05/28/2018   Procedure: LEFT MODIFIED RADICAL MASTECTOMY;  Surgeon: [name], MD;  Location: [facility];  Service: General;  Laterality: Left;   MASTECTOMY, PARTIAL Left 04/24/2017   Procedure: MASTECTOMY PARTIAL;  Surgeon: [name], MD;  Location: [facility];  Service: General;  Laterality: Left;   ORIF FEMUR FRACTURE Left 09/03/2019   Procedure: OPEN REDUCTION INTERNAL FIXATION (ORIF) LEFT HIP FEMUR FRACTURE;  Surgeon: Paralee Cancel, MD;  Location: [facility];  Service: Orthopedics;  Laterality: Left;   PORTACATH PLACEMENT Right 08/08/2018   Procedure: INSERTION PORT-A-CATH;  Surgeon: [name], MD;  Location: [facility];  Service: General;  Laterality: Right;   TONSILLECTOMY     TONSILLECTOMY AND ADENOIDECTOMY      SOCIAL HISTORY:  Social History   Socioeconomic History   Marital status: Widowed    Spouse name: Not on file   Number of children: Not on file   Years of education: Not on file   Highest education level: Not on file  Occupational History   Not on file  Tobacco Use   Smoking status: Never Smoker   Smokeless tobacco: Never Used  Vaping Use   Vaping Use: Never used  Substance and Sexual Activity  Alcohol use: No   Drug use: No   Sexual activity: Not Currently    Partners: Male  Other Topics Concern   Not on file  Social History Narrative   Not on file   Social Determinants of Health   Financial Resource Strain: Low Risk    Difficulty of Paying Living Expenses: Not hard at all  Food Insecurity: No Food Insecurity   Worried About Charity fundraiser in the Last Year: Never true   [place] in the Last Year: Never true  Transportation Needs: No Transportation Needs   Lack of Transportation (Medical): No   Lack of Transportation (Non-Medical): No  Physical Activity: Insufficiently Active   Days of Exercise per Week: 3 days   Minutes of  Exercise per Session: 20 min  Stress: No Stress Concern Present   Feeling of Stress : Only a little  Social Connections: Moderately Isolated   Frequency of Communication with Friends and Family: More than three times a week   Frequency of Social Gatherings with Friends and Family: More than three times a week   Attends Religious Services: 1 to 4 times per year   Active Member of [employer] or Organizations: Not on file   Attends Archivist Meetings: Never   Marital Status: Widowed  Human resources officer Violence: Not At Risk   Fear of Current or Ex-Partner: No   Emotionally Abused: No   Physically Abused: No   Sexually Abused: No    FAMILY HISTORY:  Family History  Problem Relation Age of Onset   Heart failure Mother    Heart failure Father    Congestive Heart Failure Brother    Tuberculosis Paternal Uncle    Tuberculosis Maternal Grandmother    Tuberculosis Maternal Grandfather    Congestive Heart Failure Brother     CURRENT MEDICATIONS:  Current Outpatient Medications  Medication Sig Dispense Refill   Ado-Trastuzumab Emtansine (KADCYLA IV) Inject into the vein every 21 ( twenty-one) days.     amLODipine (NORVASC) 5 MG tablet Take 5 mg by mouth daily.     ferrous sulfate 325 (65 FE) MG tablet Take 325 mg by mouth daily with breakfast.     labetalol (NORMODYNE) 100 MG tablet Take 1 tablet (100 mg total) by mouth 2 (two) times daily. 60 tablet 12   oxybutynin (DITROPAN) 5 MG tablet Take 5 mg by mouth daily.      PREVIDENT 5000 BOOSTER PLUS 1.1 % PSTE APPLY WITH TOOTHBRUSH AT BEDTIME AS DIRECTED.     simvastatin (ZOCOR) 40 MG tablet Take 40 mg by mouth daily.     temazepam (RESTORIL) 30 MG capsule Take 1 capsule (30 mg total) by mouth at bedtime as needed for sleep. May increase dose to 2 capsules if 1 capsule does not help. 90 capsule 1   ALPRAZolam (XANAX) 0.5 MG tablet Take 0.5 mg by mouth daily as needed. (Patient not taking: Reported on 07/02/2020)      No current facility-administered medications for this visit.    ALLERGIES:  Allergies  Allergen Reactions   Contrast Media [Iodinated Diagnostic Agents] Diarrhea    PHYSICAL EXAM:  Performance status (ECOG): 2 - Symptomatic, <50% confined to bed  Vitals:   07/02/20 1158  BP: (!) 130/55  Pulse: (!) 49  Resp: 16  Temp: (!) 96.7 F (35.9 C)  SpO2: 100%   Wt Readings from Last 3 Encounters:  07/02/20 108 lb 3.9 oz (49.1 kg)  06/10/20 110 lb 12.8 oz (50.3  kg)  05/20/20 108 lb 14.5 oz (49.4 kg)   Physical Exam Constitutional:      Comments: Ambulates with walker  Chest:     Comments: Port-a-Cath in R chest     LABORATORY DATA:  I have reviewed the labs as listed.  CBC Latest Ref Rng & Units 07/02/2020 06/10/2020 05/20/2020  WBC 4.0 - 10.5 K/uL 4.5 4.1 3.9(L)  Hemoglobin 12.0 - 15.0 g/dL 11.3(L) 11.1(L) 11.1(L)  Hematocrit 36 - 46 % 35.0(L) 33.8(L) 35.1(L)  Platelets 150 - 400 K/uL 191 162 180   CMP Latest Ref Rng & Units 07/02/2020 06/11/2020 06/10/2020  Glucose 70 - 99 mg/dL 112(H) 142(H) 145(H)  BUN 8 - 23 mg/dL '22 20 22  ' Creatinine 0.44 - 1.00 mg/dL 1.07(H) 1.03(H) 1.53(H)  Sodium 135 - 145 mmol/L 138 141 141  Potassium 3.5 - 5.1 mmol/L 4.2 3.5 3.9  Chloride 98 - 111 mmol/L 107 109 107  CO2 22 - 32 mmol/L '22 24 27  ' Calcium 8.9 - 10.3 mg/dL 9.1 8.6(L) 8.9  Total Protein 6.5 - 8.1 g/dL 6.0(L) - 5.5(L)  Total Bilirubin 0.3 - 1.2 mg/dL 0.6 - 0.6  Alkaline Phos 38 - 126 U/L 50 - 51  AST 15 - 41 U/L 23 - 27  ALT 0 - 44 U/L 18 - 21   Lab Results  Component Value Date   LDH 139 07/02/2020   LDH 128 06/10/2020   LDH 137 03/19/2020    DIAGNOSTIC IMAGING:  I have independently reviewed the scans and discussed with the patient. NM PET Image Restag (PS) Skull Base To Thigh  Result Date: 06/23/2020 CLINICAL DATA:  Subsequent treatment strategy for breast cancer. Left mastectomy in 2019. Ongoing Enhertu therapy. EXAM: NUCLEAR MEDICINE PET SKULL BASE TO THIGH  TECHNIQUE: 7.8 mCi F-18 FDG was injected intravenously. Full-ring PET imaging was performed from the skull base to thigh after the radiotracer. CT data was obtained and used for attenuation correction and anatomic localization. Fasting blood glucose: 97 mg/dl COMPARISON:  Multiple exams, including CT from 04/08/2020 and prior PET-CT from 06/10/2019 FINDINGS: Mediastinal blood pool activity: SUV max 2.6 Liver activity: SUV max NA NECK: Localized activity in the right maxilla corresponding to periapical lucency is thought to be dental in origin. Incidental CT findings: Bilateral common carotid atherosclerotic calcification. CHEST: Scattered bilateral hypermetabolic pulmonary metastatic lesions are again identified. The dominant mass in the left lower lobe has a maximum SUV of 10.9 (formerly 14.9 on 06/10/2019), faint internal calcifications, and measures 5.4 by 5.7 cm on image 106 of series 3, previously 4.5 by 4.3 cm on 06/10/2019 and previously by my measurement 5.6 by 5.4 cm on 04/08/2020. This mass is partially calcified. A dominant lesion in the right lower lobe measures 2.8 by 2.0 cm on image 101 of series 3, with maximum SUV of 11.3 (formerly 18.4). This lesion measured 2.3 by 1.6 cm on 06/10/2019 and 2.6 by 2.0 cm on 04/08/2020. The general pattern for the pulmonary nodules is that compared to last year's PET-CT, they have mildly improved in activity (although still substantially abnormal), but have enlarged in size. Persistent supraclavicular, right paratracheal, right hilar, right subcarinal, and paraesophageal adenopathy noted. Index subcarinal node 0.7 cm in short axis on image 93 of series 3 (formerly 1.0 cm on 04/08/2020 and formerly 1.0 cm on 06/10/2019) with maximum SUV 7.2 (formerly 9.2 on 06/10/2019). Accordingly the adenopathy is mildly improved. These lymph nodes are partially calcified. Subtle activity along the left posterior pleural/diaphragmatic space, maximum SUV 2.5, previously  3.5.  Incidental CT findings: Coronary, aortic arch, and branch vessel atherosclerotic vascular disease. Small bilateral pleural effusions, increased from prior. ABDOMEN/PELVIS: No significant abnormal hypermetabolic activity in this region. Incidental CT findings: Left hepatic lobe cysts. Aortoiliac atherosclerotic vascular disease. VP shunt noted. Prominent stool throughout the colon favors constipation. SKELETON: No significant abnormal hypermetabolic activity in this region. Incidental CT findings: Lumbar spondylosis and degenerative disc disease. Unchanged left proximal femoral hardware. IMPRESSION: 1. The bilateral scattered hypermetabolic pulmonary metastatic lesions and adenopathy in the chest is again noted, with the pulmonary nodules improved in activity compared to last year but having enlarged since last year; and with the adenopathy improved both in activity and size compared to last year. The lung nodules are roughly stable from 04/08/2020. 2. No compelling findings of extra thoracic malignancy. 3. Increased size of small bilateral pleural effusions. 4. Other imaging findings of potential clinical significance: Aortic Atherosclerosis (ICD10-I70.0). Coronary atherosclerosis. Prominent stool throughout the colon favors constipation. Lumbar spondylosis and degenerative disc disease. Electronically Signed   By: Van Clines M.D.   On: 06/23/2020 11:12   ECHOCARDIOGRAM COMPLETE  Result Date: 06/08/2020    ECHOCARDIOGRAM REPORT   Patient Name:   Carol Duarte Date of Exam: 06/08/2020 Medical Rec #:  [id number]        Height:       61.0 in Accession #:    [id number]       Weight:       108.9 lb Date of Birth:  04/16/1925         BSA:          1.459 m Patient Age:    4 years         BP:           123/65 mmHg Patient Gender: F                HR:           48 bpm. Exam Location:  [facility] Procedure: 2D Echo, Cardiac Doppler and Color Doppler Indications:    Chemotherapy evaluation v87.41 / v58.11   History:        Patient has prior history of Echocardiogram examinations, most                 recent 02/24/2020. Risk Factors:Hypertension and Dyslipidemia.                 Malignant neoplasm of lower-outer quadrant of left female                 breast- S/P left mastectomy.  Sonographer:    Alvino Chapel RCS Referring Phys: [phone] [place]  1. Left ventricular ejection fraction, by estimation, is 65 to 70%. The left ventricle has normal function. The left ventricle has no regional Lemma motion abnormalities. Left ventricular diastolic parameters were normal.  2. Right ventricular systolic function is normal. The right ventricular size is normal.  3. Left atrial size was mild to moderately dilated.  4. The mitral valve is normal in structure. Mild mitral valve regurgitation.  5. The aortic valve is normal in structure. Aortic valve regurgitation is not visualized. Comparison(s): The left ventricular function is unchanged. FINDINGS  Left Ventricle: Left ventricular ejection fraction, by estimation, is 65 to 70%. The left ventricle has normal function. The left ventricle has no regional Pethtel motion abnormalities. The left ventricular internal cavity size was normal in size. There is  no left ventricular hypertrophy. Left ventricular diastolic parameters  were normal. Right Ventricle: The right ventricular size is normal. Right vetricular Menger thickness was not assessed. Right ventricular systolic function is normal. Left Atrium: Left atrial size was mild to moderately dilated. Right Atrium: Right atrial size was normal in size. Pericardium: There is no evidence of pericardial effusion. Mitral Valve: The mitral valve is normal in structure. Mild mitral valve regurgitation. Tricuspid Valve: The tricuspid valve is normal in structure. Tricuspid valve regurgitation is mild. Aortic Valve: The aortic valve is normal in structure. Aortic valve regurgitation is not visualized. Pulmonic Valve: The pulmonic  valve was normal in structure. Pulmonic valve regurgitation is not visualized. Aorta: The aortic root is normal in size and structure. IAS/Shunts: No atrial level shunt detected by color flow Doppler.  LEFT VENTRICLE PLAX 2D LVIDd:         4.23 cm     Diastology LVIDs:         2.34 cm     LV e' medial:    6.31 cm/s LV PW:         0.77 cm     LV E/e' medial:  19.0 LV IVS:        0.85 cm     LV e' lateral:   6.31 cm/s LVOT diam:     1.60 cm     LV E/e' lateral: 19.0 LV SV:         75 LV SV Index:   51 LVOT Area:     2.01 cm  LV Volumes (MOD) LV vol d, MOD A2C: 38.2 ml LV vol d, MOD A4C: 57.9 ml LV vol s, MOD A2C: 20.6 ml LV vol s, MOD A4C: 19.5 ml LV SV MOD A2C:     17.6 ml LV SV MOD A4C:     57.9 ml LV SV MOD BP:      27.5 ml RIGHT VENTRICLE RV S prime:     12.10 cm/s TAPSE (M-mode): 2.3 cm LEFT ATRIUM             Index       RIGHT ATRIUM           Index LA diam:        3.55 cm 2.43 cm/m  RA Area:     13.50 cm LA Vol (A2C):   41.8 ml 28.65 ml/m RA Volume:   28.20 ml  19.33 ml/m LA Vol (A4C):   57.5 ml 39.42 ml/m LA Biplane Vol: 50.0 ml 34.28 ml/m  AORTIC VALVE LVOT Vmax:   123.00 cm/s LVOT Vmean:  88.700 cm/s LVOT VTI:    0.373 m  AORTA Ao Root diam: 3.00 cm MITRAL VALVE MV Area (PHT): 3.43 cm     SHUNTS MV Decel Time: 221 msec     Systemic VTI:  0.37 m MV E velocity: 120.00 cm/s  Systemic Diam: 1.60 cm MV A velocity: 104.00 cm/s MV E/A ratio:  1.15 Dorris Carnes MD Electronically signed by Dorris Carnes MD Signature Date/Time: 06/08/2020/9:55:52 PM    Final      ASSESSMENT:  1. Metastatic HER-2 positive left breast cancer: -Enhertu started on 10/22/2019. -CT CAP on 01/10/2020 showed response to therapy with decreasing lung nodules as well as supraclavicular and mediastinal adenopathy with no new evidence of metastatic disease. -CA 15-3 of 6.4 on 02/27/2020. -CT CAP on 04/08/2020 shows more or less stable lung and liver lesions. Left infrahilar mass measures 5.6 x 5.3 cm, previously 5.4 x 5.4 cm. No new areas  were seen. Small dependent bilateral  pleural effusions are new. -We will do nextCT scan WITHOUT PO CONTRAST. -PET scan on 06/22/2020 reviewed by me with bilateral scattered hypermetabolic pulmonary metastatic lesions slightly stable to improved from CT scan from 04/08/2020.  No extrathoracic lesions.  Small bilateral pleural effusions.  2. High risk drug monitoring: -Echo on 11/12/2019, EF 60-65%. -Echo on 02/24/2020 with EF 60-65%. -2D echo on 06/08/2020 with EF 65 to 70%.   PLAN:  1. Metastatic HER-2 positive left breast cancer: -I have reviewed PET scan results and images with the patient.  Overall stable disease. -Labs show CA 15-3 is normal.  CBC is grossly within normal limits.  LFTs are also normal with normal albumin. -Creatinine is mildly elevated at 1.07. -Patient reports that she is feeling slightly weak today.  She would wait until next week for treatment.  She will have labs done next week and if they are within normal limits, we will proceed with treatment. -I have recommended continuation of current treatment and rescan in 3 months. -I will see her back in 4 weeks for follow-up.  2. High risk drug monitoring: -Reviewed results of echocardiogram from 06/08/2020 which was normal.  3.Leg swellings: -Albumin is normal.  No leg swellings.  4. Difficulty sleeping: -Use temazepam as needed which is helping.  5. Weight loss: -She is eating well.  However she lost 1 pound.  We will closely monitor.  6.  Left axillary pain: -This is intermittent pain.  She reports feeling of sweating of the armpit, but she actually does not sweat.  She also had previous surgery with lymph node dissection in the area.  This pain is reported only in the past few weeks. -Reviewed images of the PET scan.  There is a lymph node in the left axillary region as well as a pleural-based lesion in the lung in the same vicinity. -No clear invasion into the chest Eimers. -She reports the pain once  or twice a week.  We will closely monitor.  If there is any worsening, will consider radiation to the area.   Orders placed this encounter:  No orders of the defined types were placed in this encounter.    Derek Jack, MD [place] [phone]   I, Milinda Antis, am acting as a scribe for Dr. Sanda Linger.  I, Derek Jack MD, have reviewed the above documentation for accuracy and completeness, and I agree with the above.

## 2020-07-03 LAB — CANCER ANTIGEN 15-3: CA 15-3: 7.4 U/mL (ref 0.0–25.0)

## 2020-07-09 DIAGNOSIS — I10 Essential (primary) hypertension: Secondary | ICD-10-CM | POA: Diagnosis not present

## 2020-07-09 DIAGNOSIS — E785 Hyperlipidemia, unspecified: Secondary | ICD-10-CM | POA: Diagnosis not present

## 2020-07-10 ENCOUNTER — Other Ambulatory Visit: Payer: Self-pay

## 2020-07-10 ENCOUNTER — Encounter (HOSPITAL_COMMUNITY): Payer: Self-pay

## 2020-07-10 ENCOUNTER — Inpatient Hospital Stay (HOSPITAL_COMMUNITY): Payer: Medicare Other

## 2020-07-10 VITALS — BP 109/69 | HR 51 | Temp 97.2°F | Resp 17

## 2020-07-10 DIAGNOSIS — M7989 Other specified soft tissue disorders: Secondary | ICD-10-CM | POA: Diagnosis not present

## 2020-07-10 DIAGNOSIS — I10 Essential (primary) hypertension: Secondary | ICD-10-CM | POA: Diagnosis not present

## 2020-07-10 DIAGNOSIS — Z5112 Encounter for antineoplastic immunotherapy: Secondary | ICD-10-CM | POA: Diagnosis not present

## 2020-07-10 DIAGNOSIS — Z836 Family history of other diseases of the respiratory system: Secondary | ICD-10-CM | POA: Diagnosis not present

## 2020-07-10 DIAGNOSIS — C7802 Secondary malignant neoplasm of left lung: Secondary | ICD-10-CM | POA: Diagnosis not present

## 2020-07-10 DIAGNOSIS — R110 Nausea: Secondary | ICD-10-CM | POA: Diagnosis not present

## 2020-07-10 DIAGNOSIS — C50512 Malignant neoplasm of lower-outer quadrant of left female breast: Secondary | ICD-10-CM | POA: Diagnosis not present

## 2020-07-10 DIAGNOSIS — M47816 Spondylosis without myelopathy or radiculopathy, lumbar region: Secondary | ICD-10-CM | POA: Diagnosis not present

## 2020-07-10 DIAGNOSIS — R5383 Other fatigue: Secondary | ICD-10-CM | POA: Diagnosis not present

## 2020-07-10 DIAGNOSIS — I700 Atherosclerosis of aorta: Secondary | ICD-10-CM | POA: Diagnosis not present

## 2020-07-10 DIAGNOSIS — R197 Diarrhea, unspecified: Secondary | ICD-10-CM | POA: Diagnosis not present

## 2020-07-10 DIAGNOSIS — Z79899 Other long term (current) drug therapy: Secondary | ICD-10-CM | POA: Diagnosis not present

## 2020-07-10 DIAGNOSIS — M5136 Other intervertebral disc degeneration, lumbar region: Secondary | ICD-10-CM | POA: Diagnosis not present

## 2020-07-10 DIAGNOSIS — E785 Hyperlipidemia, unspecified: Secondary | ICD-10-CM | POA: Diagnosis not present

## 2020-07-10 DIAGNOSIS — M79622 Pain in left upper arm: Secondary | ICD-10-CM | POA: Diagnosis not present

## 2020-07-10 DIAGNOSIS — C50912 Malignant neoplasm of unspecified site of left female breast: Secondary | ICD-10-CM

## 2020-07-10 DIAGNOSIS — Z8249 Family history of ischemic heart disease and other diseases of the circulatory system: Secondary | ICD-10-CM | POA: Diagnosis not present

## 2020-07-10 DIAGNOSIS — I2510 Atherosclerotic heart disease of native coronary artery without angina pectoris: Secondary | ICD-10-CM | POA: Diagnosis not present

## 2020-07-10 LAB — CBC WITH DIFFERENTIAL/PLATELET
Abs Immature Granulocytes: 0.01 10*3/uL (ref 0.00–0.07)
Basophils Absolute: 0 10*3/uL (ref 0.0–0.1)
Basophils Relative: 1 %
Eosinophils Absolute: 0.2 10*3/uL (ref 0.0–0.5)
Eosinophils Relative: 4 %
HCT: 33.8 % — ABNORMAL LOW (ref 36.0–46.0)
Hemoglobin: 10.8 g/dL — ABNORMAL LOW (ref 12.0–15.0)
Immature Granulocytes: 0 %
Lymphocytes Relative: 20 %
Lymphs Abs: 0.7 10*3/uL (ref 0.7–4.0)
MCH: 33.5 pg (ref 26.0–34.0)
MCHC: 32 g/dL (ref 30.0–36.0)
MCV: 105 fL — ABNORMAL HIGH (ref 80.0–100.0)
Monocytes Absolute: 0.4 10*3/uL (ref 0.1–1.0)
Monocytes Relative: 11 %
Neutro Abs: 2.3 10*3/uL (ref 1.7–7.7)
Neutrophils Relative %: 64 %
Platelets: 162 10*3/uL (ref 150–400)
RBC: 3.22 MIL/uL — ABNORMAL LOW (ref 3.87–5.11)
RDW: 14.2 % (ref 11.5–15.5)
WBC: 3.5 10*3/uL — ABNORMAL LOW (ref 4.0–10.5)
nRBC: 0 % (ref 0.0–0.2)

## 2020-07-10 LAB — COMPREHENSIVE METABOLIC PANEL
ALT: 16 U/L (ref 0–44)
AST: 25 U/L (ref 15–41)
Albumin: 3.3 g/dL — ABNORMAL LOW (ref 3.5–5.0)
Alkaline Phosphatase: 50 U/L (ref 38–126)
Anion gap: 9 (ref 5–15)
BUN: 25 mg/dL — ABNORMAL HIGH (ref 8–23)
CO2: 24 mmol/L (ref 22–32)
Calcium: 8.8 mg/dL — ABNORMAL LOW (ref 8.9–10.3)
Chloride: 108 mmol/L (ref 98–111)
Creatinine, Ser: 1.18 mg/dL — ABNORMAL HIGH (ref 0.44–1.00)
GFR, Estimated: 43 mL/min — ABNORMAL LOW (ref >60)
Glucose, Bld: 138 mg/dL — ABNORMAL HIGH (ref 70–99)
Potassium: 4.1 mmol/L (ref 3.5–5.1)
Sodium: 141 mmol/L (ref 135–145)
Total Bilirubin: 0.6 mg/dL (ref 0.3–1.2)
Total Protein: 5.7 g/dL — ABNORMAL LOW (ref 6.5–8.1)

## 2020-07-10 LAB — LACTATE DEHYDROGENASE: LDH: 125 U/L (ref 98–192)

## 2020-07-10 LAB — MAGNESIUM: Magnesium: 2.1 mg/dL (ref 1.7–2.4)

## 2020-07-10 MED ORDER — HEPARIN SOD (PORK) LOCK FLUSH 100 UNIT/ML IV SOLN
500.0000 [IU] | Freq: Once | INTRAVENOUS | Status: AC | PRN
  Administered 2020-07-10: 500 [IU]

## 2020-07-10 MED ORDER — ACETAMINOPHEN 325 MG PO TABS
650.0000 mg | ORAL_TABLET | Freq: Once | ORAL | Status: AC
  Administered 2020-07-10: 650 mg via ORAL

## 2020-07-10 MED ORDER — PALONOSETRON HCL INJECTION 0.25 MG/5ML
INTRAVENOUS | Status: AC
  Filled 2020-07-10: qty 5

## 2020-07-10 MED ORDER — DEXTROSE 5 % IV SOLN: Freq: Once | INTRAVENOUS | Status: AC

## 2020-07-10 MED ORDER — PALONOSETRON HCL INJECTION 0.25 MG/5ML
0.2500 mg | Freq: Once | INTRAVENOUS | Status: AC
  Administered 2020-07-10: 0.25 mg via INTRAVENOUS

## 2020-07-10 MED ORDER — DIPHENHYDRAMINE HCL 25 MG PO CAPS
ORAL_CAPSULE | ORAL | Status: AC
  Filled 2020-07-10: qty 1

## 2020-07-10 MED ORDER — DIPHENHYDRAMINE HCL 25 MG PO CAPS
25.0000 mg | ORAL_CAPSULE | Freq: Once | ORAL | Status: AC
  Administered 2020-07-10: 25 mg via ORAL

## 2020-07-10 MED ORDER — FAM-TRASTUZUMAB DERUXTECAN-NXKI CHEMO 100 MG IV SOLR
3.2000 mg/kg | Freq: Once | INTRAVENOUS | Status: AC
  Administered 2020-07-10: 168 mg via INTRAVENOUS
  Filled 2020-07-10: qty 8.4

## 2020-07-10 MED ORDER — SODIUM CHLORIDE 0.9 % IV SOLN
10.0000 mg | Freq: Once | INTRAVENOUS | Status: AC
  Administered 2020-07-10: 10 mg via INTRAVENOUS
  Filled 2020-07-10: qty 1

## 2020-07-10 MED ORDER — ACETAMINOPHEN 325 MG PO TABS
ORAL_TABLET | ORAL | Status: AC
  Filled 2020-07-10: qty 2

## 2020-07-10 NOTE — Progress Notes (Signed)
Tolerated infusion w/o adverse reaction.  Alert, in no distress.  VSS.  Discharged via wheelchair in stable condition.  

## 2020-07-10 NOTE — Progress Notes (Signed)
Patients port flushed without difficulty.  Good blood return noted with no bruising or swelling noted at site.  Transparent dressing applied and patient left accessed for chemotherapy treatment. °

## 2020-07-11 LAB — CANCER ANTIGEN 15-3: CA 15-3: 7.5 U/mL (ref 0.0–25.0)

## 2020-07-16 DIAGNOSIS — N390 Urinary tract infection, site not specified: Secondary | ICD-10-CM | POA: Diagnosis not present

## 2020-07-29 ENCOUNTER — Inpatient Hospital Stay (HOSPITAL_COMMUNITY): Payer: Medicare Other | Attending: Hematology

## 2020-07-29 ENCOUNTER — Inpatient Hospital Stay (HOSPITAL_BASED_OUTPATIENT_CLINIC_OR_DEPARTMENT_OTHER): Payer: Medicare Other | Admitting: Hematology

## 2020-07-29 ENCOUNTER — Other Ambulatory Visit: Payer: Self-pay

## 2020-07-29 ENCOUNTER — Inpatient Hospital Stay (HOSPITAL_COMMUNITY): Payer: Medicare Other

## 2020-07-29 VITALS — BP 116/42 | HR 51 | Temp 96.7°F | Resp 18

## 2020-07-29 VITALS — HR 51 | Temp 96.9°F | Resp 17 | Wt 105.8 lb

## 2020-07-29 DIAGNOSIS — Z9049 Acquired absence of other specified parts of digestive tract: Secondary | ICD-10-CM | POA: Diagnosis not present

## 2020-07-29 DIAGNOSIS — J90 Pleural effusion, not elsewhere classified: Secondary | ICD-10-CM | POA: Insufficient documentation

## 2020-07-29 DIAGNOSIS — R197 Diarrhea, unspecified: Secondary | ICD-10-CM | POA: Diagnosis not present

## 2020-07-29 DIAGNOSIS — Z5112 Encounter for antineoplastic immunotherapy: Secondary | ICD-10-CM | POA: Diagnosis not present

## 2020-07-29 DIAGNOSIS — R5383 Other fatigue: Secondary | ICD-10-CM | POA: Diagnosis not present

## 2020-07-29 DIAGNOSIS — K5900 Constipation, unspecified: Secondary | ICD-10-CM | POA: Insufficient documentation

## 2020-07-29 DIAGNOSIS — R634 Abnormal weight loss: Secondary | ICD-10-CM | POA: Diagnosis not present

## 2020-07-29 DIAGNOSIS — Z79899 Other long term (current) drug therapy: Secondary | ICD-10-CM | POA: Insufficient documentation

## 2020-07-29 DIAGNOSIS — R509 Fever, unspecified: Secondary | ICD-10-CM | POA: Diagnosis not present

## 2020-07-29 DIAGNOSIS — C7800 Secondary malignant neoplasm of unspecified lung: Secondary | ICD-10-CM | POA: Insufficient documentation

## 2020-07-29 DIAGNOSIS — M79622 Pain in left upper arm: Secondary | ICD-10-CM | POA: Insufficient documentation

## 2020-07-29 DIAGNOSIS — M7989 Other specified soft tissue disorders: Secondary | ICD-10-CM | POA: Diagnosis not present

## 2020-07-29 DIAGNOSIS — R531 Weakness: Secondary | ICD-10-CM | POA: Insufficient documentation

## 2020-07-29 DIAGNOSIS — C50912 Malignant neoplasm of unspecified site of left female breast: Secondary | ICD-10-CM

## 2020-07-29 DIAGNOSIS — M791 Myalgia, unspecified site: Secondary | ICD-10-CM | POA: Diagnosis not present

## 2020-07-29 DIAGNOSIS — Z836 Family history of other diseases of the respiratory system: Secondary | ICD-10-CM | POA: Insufficient documentation

## 2020-07-29 DIAGNOSIS — G479 Sleep disorder, unspecified: Secondary | ICD-10-CM | POA: Diagnosis not present

## 2020-07-29 DIAGNOSIS — C50512 Malignant neoplasm of lower-outer quadrant of left female breast: Secondary | ICD-10-CM | POA: Insufficient documentation

## 2020-07-29 DIAGNOSIS — I10 Essential (primary) hypertension: Secondary | ICD-10-CM | POA: Diagnosis not present

## 2020-07-29 DIAGNOSIS — R110 Nausea: Secondary | ICD-10-CM | POA: Insufficient documentation

## 2020-07-29 DIAGNOSIS — Z87442 Personal history of urinary calculi: Secondary | ICD-10-CM | POA: Insufficient documentation

## 2020-07-29 DIAGNOSIS — Z8249 Family history of ischemic heart disease and other diseases of the circulatory system: Secondary | ICD-10-CM | POA: Insufficient documentation

## 2020-07-29 DIAGNOSIS — R590 Localized enlarged lymph nodes: Secondary | ICD-10-CM | POA: Insufficient documentation

## 2020-07-29 LAB — COMPREHENSIVE METABOLIC PANEL
ALT: 18 U/L (ref 0–44)
AST: 26 U/L (ref 15–41)
Albumin: 3.4 g/dL — ABNORMAL LOW (ref 3.5–5.0)
Alkaline Phosphatase: 49 U/L (ref 38–126)
Anion gap: 7 (ref 5–15)
BUN: 26 mg/dL — ABNORMAL HIGH (ref 8–23)
CO2: 22 mmol/L (ref 22–32)
Calcium: 8.8 mg/dL — ABNORMAL LOW (ref 8.9–10.3)
Chloride: 107 mmol/L (ref 98–111)
Creatinine, Ser: 1.06 mg/dL — ABNORMAL HIGH (ref 0.44–1.00)
GFR, Estimated: 49 mL/min — ABNORMAL LOW (ref >60)
Glucose, Bld: 137 mg/dL — ABNORMAL HIGH (ref 70–99)
Potassium: 4 mmol/L (ref 3.5–5.1)
Sodium: 136 mmol/L (ref 135–145)
Total Bilirubin: 0.9 mg/dL (ref 0.3–1.2)
Total Protein: 5.8 g/dL — ABNORMAL LOW (ref 6.5–8.1)

## 2020-07-29 LAB — CBC WITH DIFFERENTIAL/PLATELET
Abs Immature Granulocytes: 0.03 10*3/uL (ref 0.00–0.07)
Basophils Absolute: 0 10*3/uL (ref 0.0–0.1)
Basophils Relative: 1 %
Eosinophils Absolute: 0.1 10*3/uL (ref 0.0–0.5)
Eosinophils Relative: 2 %
HCT: 34.3 % — ABNORMAL LOW (ref 36.0–46.0)
Hemoglobin: 11.1 g/dL — ABNORMAL LOW (ref 12.0–15.0)
Immature Granulocytes: 1 %
Lymphocytes Relative: 17 %
Lymphs Abs: 0.7 10*3/uL (ref 0.7–4.0)
MCH: 33.8 pg (ref 26.0–34.0)
MCHC: 32.4 g/dL (ref 30.0–36.0)
MCV: 104.6 fL — ABNORMAL HIGH (ref 80.0–100.0)
Monocytes Absolute: 0.4 10*3/uL (ref 0.1–1.0)
Monocytes Relative: 9 %
Neutro Abs: 2.9 10*3/uL (ref 1.7–7.7)
Neutrophils Relative %: 70 %
Platelets: 179 10*3/uL (ref 150–400)
RBC: 3.28 MIL/uL — ABNORMAL LOW (ref 3.87–5.11)
RDW: 14.2 % (ref 11.5–15.5)
WBC: 4 10*3/uL (ref 4.0–10.5)
nRBC: 0 % (ref 0.0–0.2)

## 2020-07-29 LAB — MAGNESIUM: Magnesium: 2 mg/dL (ref 1.7–2.4)

## 2020-07-29 LAB — LACTATE DEHYDROGENASE: LDH: 119 U/L (ref 98–192)

## 2020-07-29 MED ORDER — HEPARIN SOD (PORK) LOCK FLUSH 100 UNIT/ML IV SOLN
500.0000 [IU] | Freq: Once | INTRAVENOUS | Status: AC | PRN
  Administered 2020-07-29: 500 [IU]

## 2020-07-29 MED ORDER — SODIUM CHLORIDE 0.9% FLUSH
10.0000 mL | INTRAVENOUS | Status: DC | PRN
  Administered 2020-07-29 (×2): 10 mL

## 2020-07-29 MED ORDER — DEXTROSE 5 % IV SOLN: Freq: Once | INTRAVENOUS | Status: AC

## 2020-07-29 MED ORDER — DIPHENHYDRAMINE HCL 25 MG PO CAPS
25.0000 mg | ORAL_CAPSULE | Freq: Once | ORAL | Status: AC
  Administered 2020-07-29: 25 mg via ORAL
  Filled 2020-07-29: qty 1

## 2020-07-29 MED ORDER — ACETAMINOPHEN 325 MG PO TABS
650.0000 mg | ORAL_TABLET | Freq: Once | ORAL | Status: AC
  Administered 2020-07-29: 650 mg via ORAL
  Filled 2020-07-29: qty 2

## 2020-07-29 MED ORDER — PALONOSETRON HCL INJECTION 0.25 MG/5ML
0.2500 mg | Freq: Once | INTRAVENOUS | Status: AC
  Administered 2020-07-29: 0.25 mg via INTRAVENOUS
  Filled 2020-07-29: qty 5

## 2020-07-29 MED ORDER — SODIUM CHLORIDE 0.9 % IV SOLN
10.0000 mg | Freq: Once | INTRAVENOUS | Status: AC
  Administered 2020-07-29: 10 mg via INTRAVENOUS
  Filled 2020-07-29: qty 10

## 2020-07-29 MED ORDER — FAM-TRASTUZUMAB DERUXTECAN-NXKI CHEMO 100 MG IV SOLR
3.2000 mg/kg | Freq: Once | INTRAVENOUS | Status: AC
  Administered 2020-07-29: 168 mg via INTRAVENOUS
  Filled 2020-07-29: qty 8.4

## 2020-07-29 NOTE — Patient Instructions (Signed)
[facility] Discharge Instructions for Patients Receiving Chemotherapy   Beginning January 23rd 2017 lab work for the [facility] will be done in the  Main lab at [facility] on 1st floor. If you have a lab appointment with the [facility] please come in thru the  Main Entrance and check in at the main information desk   Today you received the following chemotherapy agents Enhertu. Follow-up as scheduled  To help prevent nausea and vomiting after your treatment, we encourage you to take your nausea medication   If you develop nausea and vomiting, or diarrhea that is not controlled by your medication, call the clinic.  The clinic phone number is [phone]. Office hours are Monday-Friday 8:30am-5:00pm.  BELOW ARE SYMPTOMS THAT SHOULD BE REPORTED IMMEDIATELY:  *FEVER GREATER THAN 101.0 F  *CHILLS WITH OR WITHOUT FEVER  NAUSEA AND VOMITING THAT IS NOT CONTROLLED WITH YOUR NAUSEA MEDICATION  *UNUSUAL SHORTNESS OF BREATH  *UNUSUAL BRUISING OR BLEEDING  TENDERNESS IN MOUTH AND THROAT WITH OR WITHOUT PRESENCE OF ULCERS  *URINARY PROBLEMS  *BOWEL PROBLEMS  UNUSUAL RASH Items with * indicate a potential emergency and should be followed up as soon as possible. If you have an emergency after office hours please contact your primary care physician or go to the nearest emergency department.  Please call the clinic during office hours if you have any questions or concerns.   You may also contact the Patient Navigator at [phone] should you have any questions or need assistance in obtaining follow up care.      Resources For Cancer Patients and their Caregivers ? American Cancer Society: Can assist with transportation, wigs, general needs, runs Look Good Feel Better.        602-666-0277 ? Cancer Care: Provides financial assistance, online support groups, medication/co-pay assistance.  1-800-813-HOPE (431)025-3888) ? [place] Assists [place] cancer patients and their families through emotional , educational and financial support.  712-382-8586 ? [place] DSS Where to apply for food stamps, Medicaid and utility assistance. 607-037-6513 ? [facility]: Transportation to medical appointments. 304 442 3896 ? Social Security Administration: May apply for disability if have a Stage IV cancer. 513-433-8893 785-327-5636 ? [employer], Disability and Transit Services: Assists with nutrition, care and transit needs. 713-480-6248

## 2020-07-29 NOTE — Progress Notes (Signed)
Patient was assessed by Dr. Katragadda and labs have been reviewed.  Patient is okay to proceed with treatment today. Primary RN and pharmacy aware.   

## 2020-07-29 NOTE — Patient Instructions (Signed)
[facility] at [facility] Discharge Instructions  Labs drawn from portacath today   Thank you for choosing [facility] at [facility] to provide your oncology and hematology care.  To afford each patient quality time with our provider, please arrive at least 15 minutes before your scheduled appointment time.   If you have a lab appointment with the Cancer Center please come in thru the Main Entrance and check in at the main information desk.  You need to re-schedule your appointment should you arrive 10 or more minutes late.  We strive to give you quality time with our providers, and arriving late affects you and other patients whose appointments are after yours.  Also, if you no show three or more times for appointments you may be dismissed from the clinic at the providers discretion.     Again, thank you for choosing [facility].  Our hope is that these requests will decrease the amount of time that you wait before being seen by our physicians.       _____________________________________________________________  Should you have questions after your visit to [facility], please contact our office at [phone] and follow the prompts.  Our office hours are 8:00 a.m. and 4:30 p.m. Monday - Friday.  Please note that voicemails left after 4:00 p.m. may not be returned until the following business day.  We are closed weekends and major holidays.  You do have access to a nurse 24-7, just call the main number to the clinic [phone] and do not press any options, hold on the line and a nurse will answer the phone.    For prescription refill requests, have your pharmacy contact our office and allow 72 hours.    Due to Covid, you will need to wear a mask upon entering the hospital. If you do not have a mask, a mask will be given to you at the Main Entrance upon arrival. For doctor visits, patients may have 1 support person age 18  or older with them. For treatment visits, patients can not have anyone with them due to social distancing guidelines and our immunocompromised population.     

## 2020-07-29 NOTE — Progress Notes (Signed)
[address], [place] [address]   CLINIC:  Medical Oncology/Hematology  PCP:  [name], MD [facility] [address] [place] [id number] [phone]   REASON FOR VISIT:  Follow-up for IDC of left breast  PRIOR THERAPY:  1. Lumpectomy on 04/24/2017. 2. Left modified radical mastectomy on 05/28/2018. 2. Trastuzumab x 10 cycles, pertuzumab x 6 cycles, emtansine x 8 cycles.  NGS Results: Not done  CURRENT THERAPY: Enhertu & Aloxi every 3 weeks  BRIEF ONCOLOGIC HISTORY:  Oncology History  Invasive ductal carcinoma of left breast ([facility])  03/30/2017 Initial Diagnosis   Invasive ductal carcinoma of left breast ([facility])   08/09/2018 - 02/28/2019 Chemotherapy   The patient had lapatinib (TYKERB) 250 MG tablet, 1,000 mg, Oral, Daily, 1 of 1 cycle, Start date: --, End date: -- trastuzumab (HERCEPTIN) 500 mg in sodium chloride 0.9 % 250 mL chemo infusion, 525 mg, Intravenous,  Once, 10 of 10 cycles Administration: 500 mg (08/09/2018), 350 mg (09/06/2018), 350 mg (09/27/2018), 378 mg (10/18/2018), 378 mg (11/15/2018), 378 mg (12/06/2018), 378 mg (12/28/2018), 378 mg (01/18/2019), 378 mg (02/08/2019) pertuzumab (PERJETA) 840 mg in sodium chloride 0.9 % 250 mL chemo infusion, 840 mg (100 % of original dose 840 mg), Intravenous, Once, 6 of 6 cycles Dose modification: 840 mg (original dose 840 mg, Cycle 5), 420 mg (original dose 420 mg, Cycle 6) Administration: 840 mg (11/15/2018), 420 mg (12/06/2018), 420 mg (12/28/2018), 420 mg (01/18/2019), 420 mg (02/08/2019)  for chemotherapy treatment.    09/18/2018 Genetic Testing   Negative genetic testing for the breast and gyn cancer panel.  The Breast/GYN gene panel offered by [employer] includes sequencing and rearrangement analysis for the following 23 genes:  ATM, BRCA1, BRCA2, BRIP1, CDH1, CHEK2, EPCAM, MLH1, MSH2, MSH6, NBN, NF1, PALB2, PMS2, PTEN, RAD51C, RAD51D, STK11, and TP53.   The report date is 09/18/2018.   03/12/2019 -  08/20/2019 Chemotherapy   The patient had ado-trastuzumab emtansine (KADCYLA) 140 mg in sodium chloride 0.9 % 250 mL chemo infusion, 2.4 mg/kg = 140 mg (100 % of original dose 2.4 mg/kg), Intravenous, Once, 8 of 9 cycles Dose modification: 2.4 mg/kg (original dose 2.4 mg/kg, Cycle 1, Reason: Patient Age), 3 mg/kg (original dose 2.4 mg/kg, Cycle 3, Reason: Provider Judgment) Administration: 140 mg (03/12/2019), 180 mg (04/23/2019), 180 mg (06/18/2019), 180 mg (04/02/2019), 180 mg (05/28/2019), 180 mg (07/09/2019), 180 mg (07/30/2019), 180 mg (08/20/2019)  for chemotherapy treatment.    10/22/2019 -  Chemotherapy   The patient had palonosetron (ALOXI) injection 0.25 mg, 0.25 mg, Intravenous,  Once, 13 of 16 cycles Administration: 0.25 mg (10/22/2019), 0.25 mg (11/12/2019), 0.25 mg (01/14/2020), 0.25 mg (02/04/2020), 0.25 mg (12/03/2019), 0.25 mg (12/24/2019), 0.25 mg (02/27/2020), 0.25 mg (03/19/2020), 0.25 mg (04/09/2020), 0.25 mg (04/29/2020), 0.25 mg (05/20/2020), 0.25 mg (06/11/2020), 0.25 mg (07/10/2020) fam-trastuzumab deruxtecan-nxki (ENHERTU) 168 mg in dextrose 5 % 100 mL chemo infusion, 3.2 mg/kg = 168 mg (100 % of original dose 3.2 mg/kg), Intravenous,  Once, 13 of 16 cycles Dose modification: 3.2 mg/kg (original dose 3.2 mg/kg, Cycle 1, Reason: Provider Judgment), 3.2 mg/kg (original dose 3.2 mg/kg, Cycle 2, Reason: Patient Age), 3.2 mg/kg (original dose 3.2 mg/kg, Cycle 5, Reason: Patient Age), 3.2 mg/kg (original dose 3.2 mg/kg, Cycle 3, Reason: Provider Judgment), 3.2 mg/kg (original dose 3.2 mg/kg, Cycle 4, Reason: Provider Judgment) Administration: 168 mg (10/22/2019), 168 mg (11/12/2019), 168 mg (01/14/2020), 168 mg (02/04/2020), 168 mg (12/03/2019), 168 mg (12/24/2019), 168 mg (02/27/2020), 168  mg (03/19/2020), 168 mg (04/09/2020), 168 mg (04/29/2020), 168 mg (05/20/2020), 168 mg (06/11/2020), 168 mg (07/10/2020)  for chemotherapy treatment.      CANCER STAGING: Cancer Staging No matching staging information was found  for the patient.  INTERVAL HISTORY:  [name], a 86 y.o. female, returns for routine follow-up and consideration for next cycle of chemotherapy. [name] was last seen on 07/02/2020.  Due for cycle #14 of Enhertu and Aloxi today.   Overall, she tells me she has been feeling okay. She reports that she felt weak, had a fever and was mentally altered prior to Thanksgiving for 3 days and a UTI was ruled out. She denies having any cough or vomiting, though she reports having alternating diarrhea and constipation. She reports that she feels fatigued for about 3 days after receiving her chemo, but then her energy gradually improves. Her fever subsided and she got up from bed and was feeling better.  Overall, she feels ready for next cycle of chemo today.    REVIEW OF SYSTEMS:  Review of Systems  Constitutional: Positive for appetite change (50%), fatigue (75%) and fever (for 3 days prior to Thanksgiving).  Respiratory: Negative for cough.   Gastrointestinal: Positive for constipation and diarrhea.  Musculoskeletal: Positive for myalgias (4/10 R axillary pain).  All other systems reviewed and are negative.   PAST MEDICAL/SURGICAL HISTORY:  Past Medical History:  Diagnosis Date   Cancer [facility])    left breast   History of kidney stones    Hypercholesteremia    Hypertension    Past Surgical History:  Procedure Laterality Date   APPENDECTOMY     brain cyst removed  2011   BREAST BIOPSY Left 03/28/2018   Procedure: BREAST BIOPSY;  Surgeon: [name], MD;  Location: [facility];  Service: General;  Laterality: Left;   CATARACT EXTRACTION W/PHACO Left 11/09/2015   Procedure: CATARACT EXTRACTION PHACO AND INTRAOCULAR LENS PLACEMENT LEFT EYE cde=8.97;  Surgeon: [name], MD;  Location: [facility];  Service: Ophthalmology;  Laterality: Left;   CATARACT EXTRACTION W/PHACO Right 02/04/2019   Procedure: CATARACT EXTRACTION PHACO AND INTRAOCULAR LENS PLACEMENT (IOC);  Surgeon: [name], MD;  Location: [facility];  Service: Ophthalmology;  Laterality: Right;  CDE: 15.19   EYE SURGERY     KPE left   MASTECTOMY MODIFIED RADICAL Left 05/28/2018   Procedure: LEFT MODIFIED RADICAL MASTECTOMY;  Surgeon: [name], MD;  Location: [facility];  Service: General;  Laterality: Left;   MASTECTOMY, PARTIAL Left 04/24/2017   Procedure: MASTECTOMY PARTIAL;  Surgeon: [name], MD;  Location: [facility];  Service: General;  Laterality: Left;   ORIF FEMUR FRACTURE Left 09/03/2019   Procedure: OPEN REDUCTION INTERNAL FIXATION (ORIF) LEFT HIP FEMUR FRACTURE;  Surgeon: Paralee Cancel, MD;  Location: [facility];  Service: Orthopedics;  Laterality: Left;   PORTACATH PLACEMENT Right 08/08/2018   Procedure: INSERTION PORT-A-CATH;  Surgeon: [name], MD;  Location: [facility];  Service: General;  Laterality: Right;   TONSILLECTOMY     TONSILLECTOMY AND ADENOIDECTOMY      SOCIAL HISTORY:  Social History   Socioeconomic History   Marital status: Widowed    Spouse name: Not on file   Number of children: Not on file   Years of education: Not on file   Highest education level: Not on file  Occupational History   Not on file  Tobacco Use   Smoking status: Never Smoker   Smokeless tobacco: Never Used  Vaping Use  Vaping Use: Never used  Substance and Sexual Activity   Alcohol use: No   Drug use: No   Sexual activity: Not Currently    Partners: Male  Other Topics Concern   Not on file  Social History Narrative   Not on file   Social Determinants of Health   Financial Resource Strain: Low Risk    Difficulty of Paying Living Expenses: Not hard at all  Food Insecurity: No Food Insecurity   Worried About Charity fundraiser in the Last Year: Never true   Green in the Last Year: Never true  Transportation Needs: No Transportation Needs   Lack of Transportation (Medical): No   Lack of Transportation (Non-Medical): No  Physical Activity: Insufficiently  Active   Days of Exercise per Week: 3 days   Minutes of Exercise per Session: 20 min  Stress: No Stress Concern Present   Feeling of Stress : Only a little  Social Connections: Moderately Isolated   Frequency of Communication with Friends and Family: More than three times a week   Frequency of Social Gatherings with Friends and Family: More than three times a week   Attends Religious Services: 1 to 4 times per year   Active Member of [employer] or Organizations: Not on file   Attends Archivist Meetings: Never   Marital Status: Widowed  Human resources officer Violence: Not At Risk   Fear of Current or Ex-Partner: No   Emotionally Abused: No   Physically Abused: No   Sexually Abused: No    FAMILY HISTORY:  Family History  Problem Relation Age of Onset   Heart failure Mother    Heart failure Father    Congestive Heart Failure Brother    Tuberculosis Paternal Uncle    Tuberculosis Maternal Grandmother    Tuberculosis Maternal Grandfather    Congestive Heart Failure Brother     CURRENT MEDICATIONS:  Current Outpatient Medications  Medication Sig Dispense Refill   Ado-Trastuzumab Emtansine (KADCYLA IV) Inject into the vein every 21 ( twenty-one) days.     ALPRAZolam (XANAX) 0.5 MG tablet Take 0.5 mg by mouth daily as needed.      amLODipine (NORVASC) 5 MG tablet Take 5 mg by mouth daily.     ferrous sulfate 325 (65 FE) MG tablet Take 325 mg by mouth daily with breakfast.     labetalol (NORMODYNE) 100 MG tablet Take 1 tablet (100 mg total) by mouth 2 (two) times daily. 60 tablet 12   oxybutynin (DITROPAN) 5 MG tablet Take 5 mg by mouth daily.      PREVIDENT 5000 BOOSTER PLUS 1.1 % PSTE APPLY WITH TOOTHBRUSH AT BEDTIME AS DIRECTED.     simvastatin (ZOCOR) 40 MG tablet Take 40 mg by mouth daily.     temazepam (RESTORIL) 30 MG capsule Take 1 capsule (30 mg total) by mouth at bedtime as needed for sleep. May increase dose to 2 capsules if 1 capsule does not  help. 90 capsule 1   No current facility-administered medications for this visit.    ALLERGIES:  Allergies  Allergen Reactions   Contrast Media [Iodinated Diagnostic Agents] Diarrhea    PHYSICAL EXAM:  Performance status (ECOG): 2 - Symptomatic, <50% confined to bed  Vitals:   07/29/20 0942  Pulse: (!) 51  Resp: 17  Temp: (!) 96.9 F (36.1 C)  SpO2: 98%   Wt Readings from Last 3 Encounters:  07/29/20 105 lb 12.8 oz (48 kg)  07/10/20 109 lb 12.8  oz (49.8 kg)  07/02/20 108 lb 3.9 oz (49.1 kg)   Physical Exam Vitals reviewed.  Constitutional:      Appearance: Normal appearance.  Cardiovascular:     Rate and Rhythm: Normal rate and regular rhythm.     Pulses: Normal pulses.     Heart sounds: Normal heart sounds.  Pulmonary:     Effort: Pulmonary effort is normal.     Breath sounds: Normal breath sounds.  Chest:     Comments: Port-a-Cath in R chest Lymphadenopathy:     Upper Body:     Right upper body: No axillary adenopathy.     Left upper body: Axillary adenopathy (1 cm LN) present.  Neurological:     General: No focal deficit present.     Mental Status: She is alert and oriented to person, place, and time.  Psychiatric:        Mood and Affect: Mood normal.        Behavior: Behavior normal.     LABORATORY DATA:  I have reviewed the labs as listed.  CBC Latest Ref Rng & Units 07/29/2020 07/10/2020 07/02/2020  WBC 4.0 - 10.5 K/uL 4.0 3.5(L) 4.5  Hemoglobin 12.0 - 15.0 g/dL 11.1(L) 10.8(L) 11.3(L)  Hematocrit 36 - 46 % 34.3(L) 33.8(L) 35.0(L)  Platelets 150 - 400 K/uL 179 162 191   CMP Latest Ref Rng & Units 07/29/2020 07/10/2020 07/02/2020  Glucose 70 - 99 mg/dL 137(H) 138(H) 112(H)  BUN 8 - 23 mg/dL 26(H) 25(H) 22  Creatinine 0.44 - 1.00 mg/dL 1.06(H) 1.18(H) 1.07(H)  Sodium 135 - 145 mmol/L 136 141 138  Potassium 3.5 - 5.1 mmol/L 4.0 4.1 4.2  Chloride 98 - 111 mmol/L 107 108 107  CO2 22 - 32 mmol/L '22 24 22  ' Calcium 8.9 - 10.3 mg/dL 8.8(L) 8.8(L) 9.1    Total Protein 6.5 - 8.1 g/dL 5.8(L) 5.7(L) 6.0(L)  Total Bilirubin 0.3 - 1.2 mg/dL 0.9 0.6 0.6  Alkaline Phos 38 - 126 U/L 49 50 50  AST 15 - 41 U/L '26 25 23  ' ALT 0 - 44 U/L '18 16 18   ' Lab Results  Component Value Date   LDH 119 07/29/2020   LDH 125 07/10/2020   LDH 139 07/02/2020    DIAGNOSTIC IMAGING:  I have independently reviewed the scans and discussed with the patient. No results found.   ASSESSMENT:  1. Metastatic HER-2 positive left breast cancer: -Enhertu started on 10/22/2019. -CT CAP on 01/10/2020 showed response to therapy with decreasing lung nodules as well as supraclavicular and mediastinal adenopathy with no new evidence of metastatic disease. -CA 15-3 of 6.4 on 02/27/2020. -CT CAP on 04/08/2020 shows more or less stable lung and liver lesions. Left infrahilar mass measures 5.6 x 5.3 cm, previously 5.4 x 5.4 cm. No new areas were seen. Small dependent bilateral pleural effusions are new. -We will do nextCT scan WITHOUT PO CONTRAST. -PET scan on 06/22/2020 reviewed by me with bilateral scattered hypermetabolic pulmonary metastatic lesions slightly stable to improved from CT scan from 04/08/2020.  No extrathoracic lesions.  Small bilateral pleural effusions.  2. High risk drug monitoring: -Echo on 11/12/2019, EF 60-65%. -Echo on 02/24/2020 with EF 60-65%. -2D echo on 06/08/2020 with EF 65 to 70%.   PLAN:  1. Metastatic HER-2 positive left breast cancer: -Recent PET scan showed stable disease. -Patient felt very weak and stayed in bed for 2 to 3 days immediately prior to Thanksgiving. -Reports that her energy levels have improved. -Reviewed her labs  today which showed normal LFTs and CBC. -She will proceed with her dose reduced Enhertu today. -We will see her back after Christmas as she would like to avoid treatment prior to Christmas.  2. High risk drug monitoring: -Echocardiogram from 06/08/2020 was normal.  3.Leg swellings: -Albumin is slightly low  at 3.4.  No leg swellings.  4. Difficulty sleeping: -Use temazepam as needed which is helping.  5. Weight loss: -She reports that she is eating well.  However she lost 1 pound.  We will closely monitor.  6.  Left axillary pain: -She reports intermittent pain in the left armpit.  On PET scan there is a lymph node in the left axillary region as well as a pleural-based lesion in the lung in the same vicinity.  No clear invasion of the chest [place]. -Likely pleural-based lesion accounting for on and off pain. -If there is any worsening will consider radiation therapy.   Orders placed this encounter:  No orders of the defined types were placed in this encounter.    Derek Jack, MD [facility] [phone]   I, Milinda Antis, am acting as a scribe for Dr. Sanda Linger.  I, Derek Jack MD, have reviewed the above documentation for accuracy and completeness, and I agree with the above.

## 2020-07-29 NOTE — Progress Notes (Signed)
1104 Labs reviewed with and pt seen by Dr. Delton Coombes and pt approved for Enhertu infusion today per MD                                   Carol Duarte tolerated Enhertu infusion well without complaints or incident. VSS upon discharge. Pt discharged via wheelchair in satisfactory condition

## 2020-07-29 NOTE — Patient Instructions (Signed)
[facility] at [facility] Discharge Instructions  You were seen today by Dr. Katragadda. He went over your recent results. You received your treatment today. Dr. Katragadda will see you back in 4 weeks for labs and follow up.   Thank you for choosing [facility] at [facility] to provide your oncology and hematology care.  To afford each patient quality time with our provider, please arrive at least 15 minutes before your scheduled appointment time.   If you have a lab appointment with the Cancer Center please come in thru the Main Entrance and check in at the main information desk  You need to re-schedule your appointment should you arrive 10 or more minutes late.  We strive to give you quality time with our providers, and arriving late affects you and other patients whose appointments are after yours.  Also, if you no show three or more times for appointments you may be dismissed from the clinic at the providers discretion.     Again, thank you for choosing [facility].  Our hope is that these requests will decrease the amount of time that you wait before being seen by our physicians.       _____________________________________________________________  Should you have questions after your visit to [facility], please contact our office at [phone] between the hours of 8:00 a.m. and 4:30 p.m.  Voicemails left after 4:00 p.m. will not be returned until the following business day.  For prescription refill requests, have your pharmacy contact our office and allow 72 hours.    Cancer Center Support Programs:   > Cancer Support Group  2nd Tuesday of the month 1pm-2pm, Journey Room    

## 2020-07-30 LAB — CANCER ANTIGEN 15-3: CA 15-3: 7.7 U/mL (ref 0.0–25.0)

## 2020-08-12 DIAGNOSIS — I10 Essential (primary) hypertension: Secondary | ICD-10-CM | POA: Diagnosis not present

## 2020-08-12 DIAGNOSIS — C50919 Malignant neoplasm of unspecified site of unspecified female breast: Secondary | ICD-10-CM | POA: Diagnosis not present

## 2020-08-18 ENCOUNTER — Other Ambulatory Visit (HOSPITAL_COMMUNITY): Payer: Self-pay

## 2020-08-18 MED ORDER — TEMAZEPAM 30 MG PO CAPS: 30.0000 mg | ORAL_CAPSULE | Freq: Every evening | ORAL | 1 refills | Status: DC | PRN

## 2020-08-20 ENCOUNTER — Other Ambulatory Visit (HOSPITAL_COMMUNITY): Payer: Self-pay

## 2020-08-20 MED ORDER — TEMAZEPAM 30 MG PO CAPS: 30.0000 mg | ORAL_CAPSULE | Freq: Every evening | ORAL | 1 refills | Status: DC | PRN

## 2020-08-20 MED ORDER — TEMAZEPAM 30 MG PO CAPS
30.0000 mg | ORAL_CAPSULE | Freq: Every evening | ORAL | 1 refills | Status: DC | PRN
Start: 2020-08-20 — End: 2020-08-20

## 2020-08-21 ENCOUNTER — Other Ambulatory Visit (HOSPITAL_COMMUNITY): Payer: Self-pay | Admitting: Hematology

## 2020-08-27 ENCOUNTER — Other Ambulatory Visit: Payer: Self-pay

## 2020-08-27 ENCOUNTER — Inpatient Hospital Stay (HOSPITAL_COMMUNITY): Payer: Medicare Other

## 2020-08-27 ENCOUNTER — Inpatient Hospital Stay (HOSPITAL_BASED_OUTPATIENT_CLINIC_OR_DEPARTMENT_OTHER): Payer: Medicare Other | Admitting: Hematology

## 2020-08-27 VITALS — BP 139/71 | HR 62 | Temp 97.1°F | Resp 17 | Wt 101.4 lb

## 2020-08-27 DIAGNOSIS — K5900 Constipation, unspecified: Secondary | ICD-10-CM | POA: Diagnosis not present

## 2020-08-27 DIAGNOSIS — M7989 Other specified soft tissue disorders: Secondary | ICD-10-CM | POA: Diagnosis not present

## 2020-08-27 DIAGNOSIS — G479 Sleep disorder, unspecified: Secondary | ICD-10-CM | POA: Diagnosis not present

## 2020-08-27 DIAGNOSIS — Z9049 Acquired absence of other specified parts of digestive tract: Secondary | ICD-10-CM | POA: Diagnosis not present

## 2020-08-27 DIAGNOSIS — Z87442 Personal history of urinary calculi: Secondary | ICD-10-CM | POA: Diagnosis not present

## 2020-08-27 DIAGNOSIS — R590 Localized enlarged lymph nodes: Secondary | ICD-10-CM | POA: Diagnosis not present

## 2020-08-27 DIAGNOSIS — C50512 Malignant neoplasm of lower-outer quadrant of left female breast: Secondary | ICD-10-CM | POA: Diagnosis not present

## 2020-08-27 DIAGNOSIS — Z836 Family history of other diseases of the respiratory system: Secondary | ICD-10-CM | POA: Diagnosis not present

## 2020-08-27 DIAGNOSIS — J90 Pleural effusion, not elsewhere classified: Secondary | ICD-10-CM | POA: Diagnosis not present

## 2020-08-27 DIAGNOSIS — R197 Diarrhea, unspecified: Secondary | ICD-10-CM | POA: Diagnosis not present

## 2020-08-27 DIAGNOSIS — R634 Abnormal weight loss: Secondary | ICD-10-CM | POA: Diagnosis not present

## 2020-08-27 DIAGNOSIS — M791 Myalgia, unspecified site: Secondary | ICD-10-CM | POA: Diagnosis not present

## 2020-08-27 DIAGNOSIS — R5383 Other fatigue: Secondary | ICD-10-CM | POA: Diagnosis not present

## 2020-08-27 DIAGNOSIS — C7800 Secondary malignant neoplasm of unspecified lung: Secondary | ICD-10-CM | POA: Diagnosis not present

## 2020-08-27 DIAGNOSIS — R110 Nausea: Secondary | ICD-10-CM | POA: Diagnosis not present

## 2020-08-27 DIAGNOSIS — C50912 Malignant neoplasm of unspecified site of left female breast: Secondary | ICD-10-CM

## 2020-08-27 DIAGNOSIS — R531 Weakness: Secondary | ICD-10-CM | POA: Diagnosis not present

## 2020-08-27 DIAGNOSIS — Z5112 Encounter for antineoplastic immunotherapy: Secondary | ICD-10-CM | POA: Diagnosis not present

## 2020-08-27 DIAGNOSIS — Z8249 Family history of ischemic heart disease and other diseases of the circulatory system: Secondary | ICD-10-CM | POA: Diagnosis not present

## 2020-08-27 DIAGNOSIS — R509 Fever, unspecified: Secondary | ICD-10-CM | POA: Diagnosis not present

## 2020-08-27 DIAGNOSIS — M79622 Pain in left upper arm: Secondary | ICD-10-CM | POA: Diagnosis not present

## 2020-08-27 DIAGNOSIS — I10 Essential (primary) hypertension: Secondary | ICD-10-CM | POA: Diagnosis not present

## 2020-08-27 DIAGNOSIS — Z79899 Other long term (current) drug therapy: Secondary | ICD-10-CM | POA: Diagnosis not present

## 2020-08-27 LAB — COMPREHENSIVE METABOLIC PANEL
ALT: 12 U/L (ref 0–44)
AST: 26 U/L (ref 15–41)
Albumin: 3.7 g/dL (ref 3.5–5.0)
Alkaline Phosphatase: 51 U/L (ref 38–126)
Anion gap: 10 (ref 5–15)
BUN: 28 mg/dL — ABNORMAL HIGH (ref 8–23)
CO2: 24 mmol/L (ref 22–32)
Calcium: 9.2 mg/dL (ref 8.9–10.3)
Chloride: 104 mmol/L (ref 98–111)
Creatinine, Ser: 1.19 mg/dL — ABNORMAL HIGH (ref 0.44–1.00)
GFR, Estimated: 43 mL/min — ABNORMAL LOW (ref >60)
Glucose, Bld: 134 mg/dL — ABNORMAL HIGH (ref 70–99)
Potassium: 4 mmol/L (ref 3.5–5.1)
Sodium: 138 mmol/L (ref 135–145)
Total Bilirubin: 0.7 mg/dL (ref 0.3–1.2)
Total Protein: 6.2 g/dL — ABNORMAL LOW (ref 6.5–8.1)

## 2020-08-27 LAB — CBC WITH DIFFERENTIAL/PLATELET
Abs Immature Granulocytes: 0.02 10*3/uL (ref 0.00–0.07)
Basophils Absolute: 0.1 10*3/uL (ref 0.0–0.1)
Basophils Relative: 1 %
Eosinophils Absolute: 0.1 10*3/uL (ref 0.0–0.5)
Eosinophils Relative: 2 %
HCT: 36.3 % (ref 36.0–46.0)
Hemoglobin: 11.8 g/dL — ABNORMAL LOW (ref 12.0–15.0)
Immature Granulocytes: 1 %
Lymphocytes Relative: 17 %
Lymphs Abs: 0.7 10*3/uL (ref 0.7–4.0)
MCH: 33.2 pg (ref 26.0–34.0)
MCHC: 32.5 g/dL (ref 30.0–36.0)
MCV: 102.3 fL — ABNORMAL HIGH (ref 80.0–100.0)
Monocytes Absolute: 0.4 10*3/uL (ref 0.1–1.0)
Monocytes Relative: 9 %
Neutro Abs: 3.1 10*3/uL (ref 1.7–7.7)
Neutrophils Relative %: 70 %
Platelets: 175 10*3/uL (ref 150–400)
RBC: 3.55 MIL/uL — ABNORMAL LOW (ref 3.87–5.11)
RDW: 14.1 % (ref 11.5–15.5)
WBC: 4.4 10*3/uL (ref 4.0–10.5)
nRBC: 0 % (ref 0.0–0.2)

## 2020-08-27 LAB — MAGNESIUM: Magnesium: 2.2 mg/dL (ref 1.7–2.4)

## 2020-08-27 LAB — LACTATE DEHYDROGENASE: LDH: 140 U/L (ref 98–192)

## 2020-08-27 MED ORDER — ONDANSETRON HCL 4 MG PO TABS: 4.0000 mg | ORAL_TABLET | Freq: Three times a day (TID) | ORAL | 3 refills | Status: DC | PRN

## 2020-08-27 MED ORDER — SODIUM CHLORIDE 0.9% FLUSH
10.0000 mL | Freq: Once | INTRAVENOUS | Status: AC
  Administered 2020-08-27: 10 mL via INTRAVENOUS

## 2020-08-27 MED ORDER — HEPARIN SOD (PORK) LOCK FLUSH 100 UNIT/ML IV SOLN
500.0000 [IU] | Freq: Once | INTRAVENOUS | Status: AC
  Administered 2020-08-27: 10:00:00 500 [IU] via INTRAVENOUS

## 2020-08-27 NOTE — Patient Instructions (Signed)
[facility] Discharge Instructions for Patients Receiving Chemotherapy  Today you received the following chemotherapy agents   To help prevent nausea and vomiting after your treatment, we encourage you to take your nausea medication   If you develop nausea and vomiting that is not controlled by your nausea medication, call the clinic.   BELOW ARE SYMPTOMS THAT SHOULD BE REPORTED IMMEDIATELY:  *FEVER GREATER THAN 100.5 F  *CHILLS WITH OR WITHOUT FEVER  NAUSEA AND VOMITING THAT IS NOT CONTROLLED WITH YOUR NAUSEA MEDICATION  *UNUSUAL SHORTNESS OF BREATH  *UNUSUAL BRUISING OR BLEEDING  TENDERNESS IN MOUTH AND THROAT WITH OR WITHOUT PRESENCE OF ULCERS  *URINARY PROBLEMS  *BOWEL PROBLEMS  UNUSUAL RASH Items with * indicate a potential emergency and should be followed up as soon as possible.  Feel free to call the clinic should you have any questions or concerns. The clinic phone number is [phone].  Please show the CHEMO ALERT CARD at check-in to the Emergency Department and triage nurse.   

## 2020-08-27 NOTE — Progress Notes (Signed)
Patient presents today for Enhertu infusion. Vital signs within parameters for treatment.  Patient states that she has not felt good at all since her last infusion.  She complains of constant nausea and pain to the left axilla and left rib/flank area.  Patient also states that she doesn't think she will have treatment today.  Advised the patient to tell Dr.Katragadda all her symptoms and concerns.  No treatment today per MD orders. Vital signs stable.  No complaints at this time.  Discharge from clinic via wheelchair in stable condition.  Alert and oriented X 3.  Follow up with [facility] as scheduled.

## 2020-08-27 NOTE — Progress Notes (Signed)
Patients port flushed without difficulty.  Good blood return noted with no bruising or swelling noted at site.  Transparent dressing applied.  Patient left accessed for chemotherapy treatment. 

## 2020-08-27 NOTE — Patient Instructions (Signed)
[place] at [facility] Discharge Instructions  You were seen today by Dr. Delton Coombes. He went over your recent results. You did not receive your treatment today since you are feeling fatigued. You will be scheduled for a PET scan on January 3rd.You will be prescribed Zofran to take 1 hour before eating to reduce your nausea. Dr. Delton Coombes will see you back in 1 week for labs and follow up.   Thank you for choosing [place] at [facility] to provide your oncology and hematology care.  To afford each patient quality time with our provider, please arrive at least 15 minutes before your scheduled appointment time.   If you have a lab appointment with the [facility] please come in thru the Main Entrance and check in at the main information desk  You need to re-schedule your appointment should you arrive 10 or more minutes late.  We strive to give you quality time with our providers, and arriving late affects you and other patients whose appointments are after yours.  Also, if you no show three or more times for appointments you may be dismissed from the clinic at the providers discretion.     Again, thank you for choosing [facility].  Our hope is that these requests will decrease the amount of time that you wait before being seen by our physicians.       _____________________________________________________________  Should you have questions after your visit to [facility], please contact our office at [phone] between the hours of 8:00 a.m. and 4:30 p.m.  Voicemails left after 4:00 p.m. will not be returned until the following business day.  For prescription refill requests, have your pharmacy contact our office and allow 72 hours.    Cancer Center Support Programs:   > Cancer Support Group  2nd Tuesday of the month 1pm-2pm, Journey Room

## 2020-08-27 NOTE — Progress Notes (Signed)
[address], [place] [address]   CLINIC:  Medical Oncology/Hematology  PCP:  [name], MD [place] [address] [place] [address] [phone]   REASON FOR VISIT:  Follow-up for IDC of left breast  PRIOR THERAPY:  1. Lumpectomy on 04/24/2017. 2. Left modified radical mastectomy on 05/28/2018. 2. Trastuzumab x 10 cycles, pertuzumab x 6 cycles, emtansine x 8 cycles.  NGS Results: Not done  CURRENT THERAPY: Enhertu & Aloxi every 3 weeks  BRIEF ONCOLOGIC HISTORY:  Oncology History  Invasive ductal carcinoma of left breast ([place])  03/30/2017 Initial Diagnosis   Invasive ductal carcinoma of left breast ([place])   08/09/2018 - 02/28/2019 Chemotherapy   The patient had lapatinib (TYKERB) 250 MG tablet, 1,000 mg, Oral, Daily, 1 of 1 cycle, Start date: --, End date: -- trastuzumab (HERCEPTIN) 500 mg in sodium chloride 0.9 % 250 mL chemo infusion, 525 mg, Intravenous,  Once, 10 of 10 cycles Administration: 500 mg (08/09/2018), 350 mg (09/06/2018), 350 mg (09/27/2018), 378 mg (10/18/2018), 378 mg (11/15/2018), 378 mg (12/06/2018), 378 mg (12/28/2018), 378 mg (01/18/2019), 378 mg (02/08/2019) pertuzumab (PERJETA) 840 mg in sodium chloride 0.9 % 250 mL chemo infusion, 840 mg (100 % of original dose 840 mg), Intravenous, Once, 6 of 6 cycles Dose modification: 840 mg (original dose 840 mg, Cycle 5), 420 mg (original dose 420 mg, Cycle 6) Administration: 840 mg (11/15/2018), 420 mg (12/06/2018), 420 mg (12/28/2018), 420 mg (01/18/2019), 420 mg (02/08/2019)  for chemotherapy treatment.    09/18/2018 Genetic Testing   Negative genetic testing for the breast and gyn cancer panel.  The Breast/GYN gene panel offered by [facility] includes sequencing and rearrangement analysis for the following 23 genes:  ATM, BRCA1, BRCA2, BRIP1, CDH1, CHEK2, EPCAM, MLH1, MSH2, MSH6, NBN, NF1, PALB2, PMS2, PTEN, RAD51C, RAD51D, STK11, and TP53.   The report date is 09/18/2018.   03/12/2019 -  08/20/2019 Chemotherapy   The patient had ado-trastuzumab emtansine (KADCYLA) 140 mg in sodium chloride 0.9 % 250 mL chemo infusion, 2.4 mg/kg = 140 mg (100 % of original dose 2.4 mg/kg), Intravenous, Once, 8 of 9 cycles Dose modification: 2.4 mg/kg (original dose 2.4 mg/kg, Cycle 1, Reason: Patient Age), 3 mg/kg (original dose 2.4 mg/kg, Cycle 3, Reason: Provider Judgment) Administration: 140 mg (03/12/2019), 180 mg (04/23/2019), 180 mg (06/18/2019), 180 mg (04/02/2019), 180 mg (05/28/2019), 180 mg (07/09/2019), 180 mg (07/30/2019), 180 mg (08/20/2019)  for chemotherapy treatment.    10/22/2019 -  Chemotherapy   The patient had palonosetron (ALOXI) injection 0.25 mg, 0.25 mg, Intravenous,  Once, 14 of 16 cycles Administration: 0.25 mg (10/22/2019), 0.25 mg (11/12/2019), 0.25 mg (01/14/2020), 0.25 mg (02/04/2020), 0.25 mg (12/03/2019), 0.25 mg (12/24/2019), 0.25 mg (02/27/2020), 0.25 mg (03/19/2020), 0.25 mg (04/09/2020), 0.25 mg (04/29/2020), 0.25 mg (05/20/2020), 0.25 mg (06/11/2020), 0.25 mg (07/10/2020), 0.25 mg (07/29/2020) fam-trastuzumab deruxtecan-nxki (ENHERTU) 168 mg in dextrose 5 % 100 mL chemo infusion, 3.2 mg/kg = 168 mg (100 % of original dose 3.2 mg/kg), Intravenous,  Once, 14 of 16 cycles Dose modification: 3.2 mg/kg (original dose 3.2 mg/kg, Cycle 1, Reason: Provider Judgment), 3.2 mg/kg (original dose 3.2 mg/kg, Cycle 2, Reason: Patient Age), 3.2 mg/kg (original dose 3.2 mg/kg, Cycle 5, Reason: Patient Age), 3.2 mg/kg (original dose 3.2 mg/kg, Cycle 3, Reason: Provider Judgment), 3.2 mg/kg (original dose 3.2 mg/kg, Cycle 4, Reason: Provider Judgment) Administration: 168 mg (10/22/2019), 168 mg (11/12/2019), 168 mg (01/14/2020), 168 mg (02/04/2020), 168 mg (12/03/2019), 168 mg (12/24/2019), 168  mg (02/27/2020), 168 mg (03/19/2020), 168 mg (04/09/2020), 168 mg (04/29/2020), 168 mg (05/20/2020), 168 mg (06/11/2020), 168 mg (07/10/2020), 168 mg (07/29/2020)  for chemotherapy treatment.      CANCER STAGING: Cancer  Staging No matching staging information was found for the patient.  INTERVAL HISTORY:  Ms. Cartier Mapel, a 86 y.o. female, returns for routine follow-up and consideration for next cycle of chemotherapy. Carol Duarte was last seen on 07/29/2020.  Due for cycle #15 of Enhertu and Aloxi today.   Overall, she tells me she has been feeling pretty well. She reports feeling poorly daily since receiving her last treatment on 12/1. She reports having nausea and dry-heaving when she looks at food, but is not vomiting since she is hardly eating. She continues having pain in her left armpit, usually lasting 1 hour every day. She started drinking 1 can of Ensure on 12/28 since her appetite has decreased. She takes 1/2 tablet of Imodium for when she gets diarrhea, and reports that it alternates with constipation.  She will forego treatment today on account of feeling fatigued.    REVIEW OF SYSTEMS:  Review of Systems  Constitutional: Positive for appetite change (25%) and fatigue (depleted).  Gastrointestinal: Positive for constipation, diarrhea, nausea (daily) and vomiting (dry heaving).  Musculoskeletal: Positive for myalgias (L armpit pain).    PAST MEDICAL/SURGICAL HISTORY:  Past Medical History:  Diagnosis Date  . Cancer ([place])    left breast  . History of kidney stones   . Hypercholesteremia   . Hypertension    Past Surgical History:  Procedure Laterality Date  . APPENDECTOMY    . brain cyst removed  2011  . BREAST BIOPSY Left 03/28/2018   Procedure: BREAST BIOPSY;  Surgeon: [name], MD;  Location: [facility];  Service: General;  Laterality: Left;  . CATARACT EXTRACTION W/PHACO Left 11/09/2015   Procedure: CATARACT EXTRACTION PHACO AND INTRAOCULAR LENS PLACEMENT LEFT EYE cde=8.97;  Surgeon: [name], MD;  Location: [facility];  Service: Ophthalmology;  Laterality: Left;  . CATARACT EXTRACTION W/PHACO Right 02/04/2019   Procedure: CATARACT EXTRACTION PHACO AND INTRAOCULAR LENS PLACEMENT (IOC);   Surgeon: [name], MD;  Location: [facility];  Service: Ophthalmology;  Laterality: Right;  CDE: 15.19  . EYE SURGERY     KPE left  . MASTECTOMY MODIFIED RADICAL Left 05/28/2018   Procedure: LEFT MODIFIED RADICAL MASTECTOMY;  Surgeon: [name], MD;  Location: [facility];  Service: General;  Laterality: Left;  Marland Kitchen MASTECTOMY, PARTIAL Left 04/24/2017   Procedure: MASTECTOMY PARTIAL;  Surgeon: [name], MD;  Location: [facility];  Service: General;  Laterality: Left;  . ORIF FEMUR FRACTURE Left 09/03/2019   Procedure: OPEN REDUCTION INTERNAL FIXATION (ORIF) LEFT HIP FEMUR FRACTURE;  Surgeon: Paralee Cancel, MD;  Location: [facility];  Service: Orthopedics;  Laterality: Left;  . PORTACATH PLACEMENT Right 08/08/2018   Procedure: INSERTION PORT-A-CATH;  Surgeon: [name], MD;  Location: [facility];  Service: General;  Laterality: Right;  . TONSILLECTOMY    . TONSILLECTOMY AND ADENOIDECTOMY      SOCIAL HISTORY:  Social History   Socioeconomic History  . Marital status: Widowed    Spouse name: Not on file  . Number of children: Not on file  . Years of education: Not on file  . Highest education level: Not on file  Occupational History  . Not on file  Tobacco Use  . Smoking status: Never Smoker  . Smokeless tobacco: Never Used  Vaping Use  . Vaping Use: Never used  Substance and Sexual Activity  . Alcohol use: No  . Drug use: No  . Sexual activity: Not Currently    Partners: Male  Other Topics Concern  . Not on file  Social History Narrative  . Not on file   Social Determinants of Health   Financial Resource Strain: Low Risk   . Difficulty of Paying Living Expenses: Not hard at all  Food Insecurity: No Food Insecurity  . Worried About Charity fundraiser in the Last Year: Never true  . Ran Out of Food in the Last Year: Never true  Transportation Needs: No Transportation Needs  . Lack of Transportation (Medical): No  . Lack of Transportation (Non-Medical): No  Physical Activity:  Insufficiently Active  . Days of Exercise per Week: 3 days  . Minutes of Exercise per Session: 20 min  Stress: No Stress Concern Present  . Feeling of Stress : Only a little  Social Connections: Moderately Isolated  . Frequency of Communication with Friends and Family: More than three times a week  . Frequency of Social Gatherings with Friends and Family: More than three times a week  . Attends Religious Services: 1 to 4 times per year  . Active Member of Clubs or Organizations: Not on file  . Attends Archivist Meetings: Never  . Marital Status: Widowed  Intimate Partner Violence: Not At Risk  . Fear of Current or Ex-Partner: No  . Emotionally Abused: No  . Physically Abused: No  . Sexually Abused: No    FAMILY HISTORY:  Family History  Problem Relation Age of Onset  . Heart failure Mother   . Heart failure Father   . Congestive Heart Failure Brother   . Tuberculosis Paternal Uncle   . Tuberculosis Maternal Grandmother   . Tuberculosis Maternal Grandfather   . Congestive Heart Failure Brother     CURRENT MEDICATIONS:  Current Outpatient Medications  Medication Sig Dispense Refill  . Ado-Trastuzumab Emtansine (KADCYLA IV) Inject into the vein every 21 ( twenty-one) days.    Marland Kitchen amLODipine (NORVASC) 5 MG tablet Take 5 mg by mouth daily.    . ferrous sulfate 325 (65 FE) MG tablet Take 325 mg by mouth daily with breakfast.    . labetalol (NORMODYNE) 100 MG tablet Take 1 tablet (100 mg total) by mouth 2 (two) times daily. 60 tablet 12  . ondansetron (ZOFRAN) 4 MG tablet Take 1 tablet (4 mg total) by mouth every 8 (eight) hours as needed for nausea or vomiting. 30 tablet 3  . oxybutynin (DITROPAN) 5 MG tablet Take 5 mg by mouth daily.     Marland Kitchen PREVIDENT 5000 BOOSTER PLUS 1.1 % PSTE APPLY WITH TOOTHBRUSH AT BEDTIME AS DIRECTED.    Marland Kitchen simvastatin (ZOCOR) 40 MG tablet Take 40 mg by mouth daily.    . temazepam (RESTORIL) 30 MG capsule TAKE 1 CAPSULE BY MOUTH ONCE DAILY AT BEDTIME  AS NEEDED FOR SLEEP (  MAY  INCREASE  DOSE  TO  2  CAPSULES  IF  1  CAPSULE  DOES  NOT  HELP.) 30 capsule 0   No current facility-administered medications for this visit.    ALLERGIES:  Allergies  Allergen Reactions  . Contrast Media [Iodinated Diagnostic Agents] Diarrhea    PHYSICAL EXAM:  Performance status (ECOG): 2 - Symptomatic, <50% confined to bed  Vitals:   08/27/20 0941  BP: 139/71  Pulse: 62  Resp: 17  Temp: (!) 97.1 F (36.2 C)  SpO2: 98%  Wt Readings from Last 3 Encounters:  08/27/20 101 lb 6.4 oz (46 kg)  07/29/20 105 lb 12.8 oz (48 kg)  07/10/20 109 lb 12.8 oz (49.8 kg)   Physical Exam Vitals reviewed.  Constitutional:      Appearance: Normal appearance.  Cardiovascular:     Rate and Rhythm: Normal rate and regular rhythm.     Pulses: Normal pulses.     Heart sounds: Normal heart sounds.  Pulmonary:     Effort: Pulmonary effort is normal.     Breath sounds: Normal breath sounds.  Chest:  Breasts:     Left: Axillary adenopathy present.      Comments: Port-a-Cath in R chest Abdominal:     Palpations: Abdomen is soft.     Tenderness: There is no abdominal tenderness.  Lymphadenopathy:     Upper Body:     Left upper body: Axillary adenopathy present.  Neurological:     General: No focal deficit present.     Mental Status: She is alert and oriented to person, place, and time.  Psychiatric:        Mood and Affect: Mood normal.        Behavior: Behavior normal.     LABORATORY DATA:  I have reviewed the labs as listed.  CBC Latest Ref Rng & Units 08/27/2020 07/29/2020 07/10/2020  WBC 4.0 - 10.5 K/uL 4.4 4.0 3.5(L)  Hemoglobin 12.0 - 15.0 g/dL 11.8(L) 11.1(L) 10.8(L)  Hematocrit 36.0 - 46.0 % 36.3 34.3(L) 33.8(L)  Platelets 150 - 400 K/uL 175 179 162   CMP Latest Ref Rng & Units 08/27/2020 07/29/2020 07/10/2020  Glucose 70 - 99 mg/dL 134(H) 137(H) 138(H)  BUN 8 - 23 mg/dL 28(H) 26(H) 25(H)  Creatinine 0.44 - 1.00 mg/dL 1.19(H) 1.06(H) 1.18(H)   Sodium 135 - 145 mmol/L 138 136 141  Potassium 3.5 - 5.1 mmol/L 4.0 4.0 4.1  Chloride 98 - 111 mmol/L 104 107 108  CO2 22 - 32 mmol/L _0 Calcium 8.9 - 10.3 mg/dL 9.2 8.8(L) 8.8(L)  Total Protein 6.5 - 8.1 g/dL 6.2(L) 5.8(L) 5.7(L)  Total Bilirubin 0.3 - 1.2 mg/dL 0.7 0.9 0.6  Alkaline Phos 38 - 126 U/L 51 49 50  AST 15 - 41 U/L _1 ALT 0 - 44 U/L _2 Lab Results  Component Value Date   LDH 140 08/27/2020   LDH 119 07/29/2020   LDH 125 07/10/2020    DIAGNOSTIC IMAGING:  I have independently reviewed the scans and discussed with the patient. No results found.   ASSESSMENT:  1. Metastatic HER-2 positive left breast cancer: -Enhertu started on 10/22/2019. -CT CAP on 01/10/2020 showed response to therapy with decreasing lung nodules as well as supraclavicular and mediastinal adenopathy with no new evidence of metastatic disease. -CA 15-3 of 6.4 on 02/27/2020. -CT CAP on 04/08/2020 shows more or less stable lung and liver lesions. Left infrahilar mass measures 5.6 x 5.3 cm, previously 5.4 x 5.4 cm. No new areas were seen. Small dependent bilateral pleural effusions are new. -We will do nextCT scan WITHOUT PO CONTRAST. -PET scan on 06/22/2020 reviewed by me with bilateral scattered hypermetabolic pulmonary metastatic lesions slightly stable to improved from CT scan from 04/08/2020. No extrathoracic lesions. Small bilateral pleural effusions.  2. High risk drug monitoring: -Echo on 11/12/2019, EF 60-65%. -Echo on 02/24/2020 with EF 60-65%. -2D echo on 06/08/2020 with EF 65 to 70%.   PLAN:  1. Metastatic HER-2 positive left breast  cancer: -Last treatment with Enhertu was on 07/29/2020. -She reported a left-sided chest Letson pain, mostly in the left lateral lower ribs which is new.  She also reports left axillary pain.  She reports severely decreased energy levels.  She also lost 4 pounds since last treatment. -Reviewed her labs which showed normal CBC.  CA 15-3  was 7.7. -Because of severe weakness, I have recommended holding off on her treatment. -I have recommended restaging PET CT scan.  She cannot do CT scan as she had severe diarrhea after last.  If there is any progression, will consider change in therapy.  2. High risk drug monitoring: -Echo on 06/08/2020 was normal.  3.Leg swellings: -Albumin has improved to 3.7.  Leg swellings improved.  4. Difficulty sleeping: -Continue temazepam as needed.  5. Weight loss: -She has lost 4 pounds since last treatment.  6. Left axillary pain: -She has intermittent pain in the left armpit.  Will consider radiation therapy if any significant worsening.   Orders placed this encounter:  Orders Placed This Encounter  Procedures  . CBC with Differential/Platelet  . Comprehensive metabolic panel  . Magnesium  . Cancer antigen 15-3     Derek Jack, MD [place] [phone]   I, Milinda Antis, am acting as a scribe for Dr. Sanda Linger.  I, Derek Jack MD, have reviewed the above documentation for accuracy and completeness, and I agree with the above.

## 2020-08-28 LAB — CANCER ANTIGEN 15-3: CA 15-3: 8.3 U/mL (ref 0.0–25.0)

## 2020-09-03 ENCOUNTER — Inpatient Hospital Stay (HOSPITAL_COMMUNITY): Payer: Medicare Other

## 2020-09-03 ENCOUNTER — Inpatient Hospital Stay (HOSPITAL_COMMUNITY): Payer: Medicare Other | Admitting: Hematology

## 2020-09-07 DIAGNOSIS — C50919 Malignant neoplasm of unspecified site of unspecified female breast: Secondary | ICD-10-CM | POA: Diagnosis not present

## 2020-09-07 DIAGNOSIS — Z1389 Encounter for screening for other disorder: Secondary | ICD-10-CM | POA: Diagnosis not present

## 2020-09-07 DIAGNOSIS — I10 Essential (primary) hypertension: Secondary | ICD-10-CM | POA: Diagnosis not present

## 2020-09-07 DIAGNOSIS — D500 Iron deficiency anemia secondary to blood loss (chronic): Secondary | ICD-10-CM | POA: Diagnosis not present

## 2020-09-07 DIAGNOSIS — Z0001 Encounter for general adult medical examination with abnormal findings: Secondary | ICD-10-CM | POA: Diagnosis not present

## 2020-09-14 ENCOUNTER — Encounter (HOSPITAL_COMMUNITY): Payer: Medicare Other

## 2020-09-14 ENCOUNTER — Encounter (HOSPITAL_COMMUNITY): Payer: Self-pay

## 2020-09-16 ENCOUNTER — Other Ambulatory Visit: Payer: Self-pay

## 2020-09-16 ENCOUNTER — Inpatient Hospital Stay (HOSPITAL_BASED_OUTPATIENT_CLINIC_OR_DEPARTMENT_OTHER): Payer: Medicare Other | Admitting: Hematology

## 2020-09-16 ENCOUNTER — Inpatient Hospital Stay (HOSPITAL_COMMUNITY): Payer: Medicare Other | Attending: Hematology

## 2020-09-16 ENCOUNTER — Inpatient Hospital Stay (HOSPITAL_COMMUNITY): Payer: Medicare Other

## 2020-09-16 VITALS — BP 120/60 | HR 53 | Temp 96.2°F | Resp 16 | Wt 101.2 lb

## 2020-09-16 DIAGNOSIS — C773 Secondary and unspecified malignant neoplasm of axilla and upper limb lymph nodes: Secondary | ICD-10-CM | POA: Insufficient documentation

## 2020-09-16 DIAGNOSIS — R109 Unspecified abdominal pain: Secondary | ICD-10-CM | POA: Insufficient documentation

## 2020-09-16 DIAGNOSIS — R1032 Left lower quadrant pain: Secondary | ICD-10-CM | POA: Diagnosis not present

## 2020-09-16 DIAGNOSIS — I6529 Occlusion and stenosis of unspecified carotid artery: Secondary | ICD-10-CM | POA: Diagnosis not present

## 2020-09-16 DIAGNOSIS — I10 Essential (primary) hypertension: Secondary | ICD-10-CM | POA: Diagnosis not present

## 2020-09-16 DIAGNOSIS — R0989 Other specified symptoms and signs involving the circulatory and respiratory systems: Secondary | ICD-10-CM | POA: Diagnosis not present

## 2020-09-16 DIAGNOSIS — M791 Myalgia, unspecified site: Secondary | ICD-10-CM | POA: Diagnosis not present

## 2020-09-16 DIAGNOSIS — M79622 Pain in left upper arm: Secondary | ICD-10-CM | POA: Insufficient documentation

## 2020-09-16 DIAGNOSIS — Z5112 Encounter for antineoplastic immunotherapy: Secondary | ICD-10-CM | POA: Insufficient documentation

## 2020-09-16 DIAGNOSIS — M7989 Other specified soft tissue disorders: Secondary | ICD-10-CM | POA: Insufficient documentation

## 2020-09-16 DIAGNOSIS — Z87442 Personal history of urinary calculi: Secondary | ICD-10-CM | POA: Insufficient documentation

## 2020-09-16 DIAGNOSIS — I2510 Atherosclerotic heart disease of native coronary artery without angina pectoris: Secondary | ICD-10-CM | POA: Insufficient documentation

## 2020-09-16 DIAGNOSIS — R600 Localized edema: Secondary | ICD-10-CM | POA: Insufficient documentation

## 2020-09-16 DIAGNOSIS — K7689 Other specified diseases of liver: Secondary | ICD-10-CM | POA: Insufficient documentation

## 2020-09-16 DIAGNOSIS — G479 Sleep disorder, unspecified: Secondary | ICD-10-CM | POA: Diagnosis not present

## 2020-09-16 DIAGNOSIS — R590 Localized enlarged lymph nodes: Secondary | ICD-10-CM | POA: Insufficient documentation

## 2020-09-16 DIAGNOSIS — Z9012 Acquired absence of left breast and nipple: Secondary | ICD-10-CM | POA: Insufficient documentation

## 2020-09-16 DIAGNOSIS — M8580 Other specified disorders of bone density and structure, unspecified site: Secondary | ICD-10-CM | POA: Insufficient documentation

## 2020-09-16 DIAGNOSIS — K5900 Constipation, unspecified: Secondary | ICD-10-CM | POA: Diagnosis not present

## 2020-09-16 DIAGNOSIS — R6339 Other feeding difficulties: Secondary | ICD-10-CM | POA: Diagnosis not present

## 2020-09-16 DIAGNOSIS — K449 Diaphragmatic hernia without obstruction or gangrene: Secondary | ICD-10-CM | POA: Insufficient documentation

## 2020-09-16 DIAGNOSIS — C50912 Malignant neoplasm of unspecified site of left female breast: Secondary | ICD-10-CM

## 2020-09-16 DIAGNOSIS — R634 Abnormal weight loss: Secondary | ICD-10-CM | POA: Insufficient documentation

## 2020-09-16 DIAGNOSIS — R197 Diarrhea, unspecified: Secondary | ICD-10-CM | POA: Diagnosis not present

## 2020-09-16 DIAGNOSIS — J90 Pleural effusion, not elsewhere classified: Secondary | ICD-10-CM | POA: Insufficient documentation

## 2020-09-16 DIAGNOSIS — Z836 Family history of other diseases of the respiratory system: Secondary | ICD-10-CM | POA: Insufficient documentation

## 2020-09-16 DIAGNOSIS — Z79899 Other long term (current) drug therapy: Secondary | ICD-10-CM | POA: Insufficient documentation

## 2020-09-16 DIAGNOSIS — C50512 Malignant neoplasm of lower-outer quadrant of left female breast: Secondary | ICD-10-CM | POA: Insufficient documentation

## 2020-09-16 DIAGNOSIS — Z8249 Family history of ischemic heart disease and other diseases of the circulatory system: Secondary | ICD-10-CM | POA: Insufficient documentation

## 2020-09-16 DIAGNOSIS — Z9049 Acquired absence of other specified parts of digestive tract: Secondary | ICD-10-CM | POA: Insufficient documentation

## 2020-09-16 DIAGNOSIS — I700 Atherosclerosis of aorta: Secondary | ICD-10-CM | POA: Diagnosis not present

## 2020-09-16 LAB — CBC WITH DIFFERENTIAL/PLATELET
Abs Immature Granulocytes: 0.01 10*3/uL (ref 0.00–0.07)
Basophils Absolute: 0 10*3/uL (ref 0.0–0.1)
Basophils Relative: 1 %
Eosinophils Absolute: 0.1 10*3/uL (ref 0.0–0.5)
Eosinophils Relative: 2 %
HCT: 37.5 % (ref 36.0–46.0)
Hemoglobin: 11.8 g/dL — ABNORMAL LOW (ref 12.0–15.0)
Immature Granulocytes: 0 %
Lymphocytes Relative: 15 %
Lymphs Abs: 0.9 10*3/uL (ref 0.7–4.0)
MCH: 32.5 pg (ref 26.0–34.0)
MCHC: 31.5 g/dL (ref 30.0–36.0)
MCV: 103.3 fL — ABNORMAL HIGH (ref 80.0–100.0)
Monocytes Absolute: 0.4 10*3/uL (ref 0.1–1.0)
Monocytes Relative: 7 %
Neutro Abs: 4.4 10*3/uL (ref 1.7–7.7)
Neutrophils Relative %: 75 %
Platelets: 199 10*3/uL (ref 150–400)
RBC: 3.63 MIL/uL — ABNORMAL LOW (ref 3.87–5.11)
RDW: 13.5 % (ref 11.5–15.5)
WBC: 5.9 10*3/uL (ref 4.0–10.5)
nRBC: 0 % (ref 0.0–0.2)

## 2020-09-16 LAB — COMPREHENSIVE METABOLIC PANEL
ALT: 17 U/L (ref 0–44)
AST: 30 U/L (ref 15–41)
Albumin: 3.4 g/dL — ABNORMAL LOW (ref 3.5–5.0)
Alkaline Phosphatase: 47 U/L (ref 38–126)
Anion gap: 8 (ref 5–15)
BUN: 29 mg/dL — ABNORMAL HIGH (ref 8–23)
CO2: 26 mmol/L (ref 22–32)
Calcium: 9.2 mg/dL (ref 8.9–10.3)
Chloride: 104 mmol/L (ref 98–111)
Creatinine, Ser: 1.24 mg/dL — ABNORMAL HIGH (ref 0.44–1.00)
GFR, Estimated: 41 mL/min — ABNORMAL LOW (ref >60)
Glucose, Bld: 129 mg/dL — ABNORMAL HIGH (ref 70–99)
Potassium: 4.6 mmol/L (ref 3.5–5.1)
Sodium: 138 mmol/L (ref 135–145)
Total Bilirubin: 0.4 mg/dL (ref 0.3–1.2)
Total Protein: 5.9 g/dL — ABNORMAL LOW (ref 6.5–8.1)

## 2020-09-16 LAB — MAGNESIUM: Magnesium: 2 mg/dL (ref 1.7–2.4)

## 2020-09-16 MED ORDER — HEPARIN SOD (PORK) LOCK FLUSH 100 UNIT/ML IV SOLN
500.0000 [IU] | Freq: Once | INTRAVENOUS | Status: AC
  Administered 2020-09-16: 500 [IU] via INTRAVENOUS

## 2020-09-16 MED ORDER — SODIUM CHLORIDE 0.9% FLUSH
10.0000 mL | Freq: Once | INTRAVENOUS | Status: AC
  Administered 2020-09-16: 10 mL via INTRAVENOUS

## 2020-09-16 MED FILL — Dexamethasone Sodium Phosphate Inj 100 MG/10ML: INTRAMUSCULAR | Qty: 1 | Status: AC

## 2020-09-16 NOTE — Patient Instructions (Signed)
[place] at [facility] Discharge Instructions  You were seen today by Dr. Delton Coombes. He went over your recent results. You did not receive your treatment today until your PET scan is done as soon as possible. Dr. Delton Coombes will see you back after the PET scan for labs and follow up.   Thank you for choosing [place] at [facility] to provide your oncology and hematology care.  To afford each patient quality time with our provider, please arrive at least 15 minutes before your scheduled appointment time.   If you have a lab appointment with the [facility] please come in thru the Main Entrance and check in at the main information desk  You need to re-schedule your appointment should you arrive 10 or more minutes late.  We strive to give you quality time with our providers, and arriving late affects you and other patients whose appointments are after yours.  Also, if you no show three or more times for appointments you may be dismissed from the clinic at the providers discretion.     Again, thank you for choosing [facility].  Our hope is that these requests will decrease the amount of time that you wait before being seen by our physicians.       _____________________________________________________________  Should you have questions after your visit to [facility], please contact our office at [phone] between the hours of 8:00 a.m. and 4:30 p.m.  Voicemails left after 4:00 p.m. will not be returned until the following business day.  For prescription refill requests, have your pharmacy contact our office and allow 72 hours.    Cancer Center Support Programs:   > Cancer Support Group  2nd Tuesday of the month 1pm-2pm, Journey Room

## 2020-09-16 NOTE — Progress Notes (Signed)
[address] [place], [place] [address]   CLINIC:  Medical Oncology/Hematology  PCP:  [name], MD [place] [address] [place] [address] [phone]   REASON FOR VISIT:  Follow-up for IDC of left breast  PRIOR THERAPY:  1. Lumpectomy on 04/24/2017. 2. Left modified radical mastectomy on 05/28/2018. 2. Trastuzumab x 10 cycles, pertuzumab x 6 cycles, emtansine x 8 cycles.  NGS Results: Not done  CURRENT THERAPY: Enhertu & Aloxi every 3 weeks  BRIEF ONCOLOGIC HISTORY:  Oncology History  Invasive ductal carcinoma of left breast ([place])  03/30/2017 Initial Diagnosis   Invasive ductal carcinoma of left breast ([place])   08/09/2018 - 02/28/2019 Chemotherapy   The patient had lapatinib (TYKERB) 250 MG tablet, 1,000 mg, Oral, Daily, 1 of 1 cycle, Start date: --, End date: -- trastuzumab (HERCEPTIN) 500 mg in sodium chloride 0.9 % 250 mL chemo infusion, 525 mg, Intravenous,  Once, 10 of 10 cycles Administration: 500 mg (08/09/2018), 350 mg (09/06/2018), 350 mg (09/27/2018), 378 mg (10/18/2018), 378 mg (11/15/2018), 378 mg (12/06/2018), 378 mg (12/28/2018), 378 mg (01/18/2019), 378 mg (02/08/2019) pertuzumab (PERJETA) 840 mg in sodium chloride 0.9 % 250 mL chemo infusion, 840 mg (100 % of original dose 840 mg), Intravenous, Once, 6 of 6 cycles Dose modification: 840 mg (original dose 840 mg, Cycle 5), 420 mg (original dose 420 mg, Cycle 6) Administration: 840 mg (11/15/2018), 420 mg (12/06/2018), 420 mg (12/28/2018), 420 mg (01/18/2019), 420 mg (02/08/2019)  for chemotherapy treatment.    09/18/2018 Genetic Testing   Negative genetic testing for the breast and gyn cancer panel.  The Breast/GYN gene panel offered by [facility] includes sequencing and rearrangement analysis for the following 23 genes:  ATM, BRCA1, BRCA2, BRIP1, CDH1, CHEK2, EPCAM, MLH1, MSH2, MSH6, NBN, NF1, PALB2, PMS2, PTEN, RAD51C, RAD51D, STK11, and TP53.   The report date is 09/18/2018.   03/12/2019 -  08/20/2019 Chemotherapy   The patient had ado-trastuzumab emtansine (KADCYLA) 140 mg in sodium chloride 0.9 % 250 mL chemo infusion, 2.4 mg/kg = 140 mg (100 % of original dose 2.4 mg/kg), Intravenous, Once, 8 of 9 cycles Dose modification: 2.4 mg/kg (original dose 2.4 mg/kg, Cycle 1, Reason: Patient Age), 3 mg/kg (original dose 2.4 mg/kg, Cycle 3, Reason: Provider Judgment) Administration: 140 mg (03/12/2019), 180 mg (04/23/2019), 180 mg (06/18/2019), 180 mg (04/02/2019), 180 mg (05/28/2019), 180 mg (07/09/2019), 180 mg (07/30/2019), 180 mg (08/20/2019)  for chemotherapy treatment.    10/22/2019 -  Chemotherapy    Patient is on Treatment Plan: BREAST METASTATIC FAM-TRASTUZUMAB DERUXTECAN-NXKI (ENHERTU) Q21D        CANCER STAGING: Cancer Staging No matching staging information was found for the patient.  INTERVAL HISTORY:  Carol Duarte, a 87 y.o. female, returns for routine follow-up and consideration for next cycle of immunotherapy. Evalena was last seen on 08/27/2020.  Due for cycle #15 of Enhertu and Aloxi today.   Overall, she tells me she has been feeling well. She continues having intermittent pain in her left flank and intermittent pain in her left axilla. The axillary pain comes about every other day and has an Icy-Hot which she applies to her left shoulder and left flank which occasionally helps. She denies getting pain in her axilla if she breaths deeply. She denies having any pain currently. Her appetite is good and started drinking 1 can of Ensure daily this week along with 2 good meals per day. On the days that she feels like cooking,  she will cook. She takes Restoril at bedtime which lasts for half of the night.  Her treatment will be delayed until her PET scan is done.    REVIEW OF SYSTEMS:  Review of Systems  Constitutional: Positive for appetite change (50%) and fatigue (depleted).  Gastrointestinal: Positive for abdominal pain (intermittent LLQ/L flank pain),  constipation and diarrhea.  Musculoskeletal: Positive for myalgias (intermittent L axilla pain).  All other systems reviewed and are negative.   PAST MEDICAL/SURGICAL HISTORY:  Past Medical History:  Diagnosis Date  . Cancer ([place])    left breast  . History of kidney stones   . Hypercholesteremia   . Hypertension    Past Surgical History:  Procedure Laterality Date  . APPENDECTOMY    . brain cyst removed  2011  . BREAST BIOPSY Left 03/28/2018   Procedure: BREAST BIOPSY;  Surgeon: [name], MD;  Location: [facility];  Service: General;  Laterality: Left;  . CATARACT EXTRACTION W/PHACO Left 11/09/2015   Procedure: CATARACT EXTRACTION PHACO AND INTRAOCULAR LENS PLACEMENT LEFT EYE cde=8.97;  Surgeon: [name], MD;  Location: [facility];  Service: Ophthalmology;  Laterality: Left;  . CATARACT EXTRACTION W/PHACO Right 02/04/2019   Procedure: CATARACT EXTRACTION PHACO AND INTRAOCULAR LENS PLACEMENT (IOC);  Surgeon: [name], MD;  Location: [facility];  Service: Ophthalmology;  Laterality: Right;  CDE: 15.19  . EYE SURGERY     KPE left  . MASTECTOMY MODIFIED RADICAL Left 05/28/2018   Procedure: LEFT MODIFIED RADICAL MASTECTOMY;  Surgeon: [name], MD;  Location: [facility];  Service: General;  Laterality: Left;  Marland Kitchen MASTECTOMY, PARTIAL Left 04/24/2017   Procedure: MASTECTOMY PARTIAL;  Surgeon: [name], MD;  Location: [facility];  Service: General;  Laterality: Left;  . ORIF FEMUR FRACTURE Left 09/03/2019   Procedure: OPEN REDUCTION INTERNAL FIXATION (ORIF) LEFT HIP FEMUR FRACTURE;  Surgeon: Paralee Cancel, MD;  Location: [facility];  Service: Orthopedics;  Laterality: Left;  . PORTACATH PLACEMENT Right 08/08/2018   Procedure: INSERTION PORT-A-CATH;  Surgeon: [name], MD;  Location: [facility];  Service: General;  Laterality: Right;  . TONSILLECTOMY    . TONSILLECTOMY AND ADENOIDECTOMY      SOCIAL HISTORY:  Social History   Socioeconomic History  . Marital status: Widowed    Spouse name: Not  on file  . Number of children: Not on file  . Years of education: Not on file  . Highest education level: Not on file  Occupational History  . Not on file  Tobacco Use  . Smoking status: Never Smoker  . Smokeless tobacco: Never Used  Vaping Use  . Vaping Use: Never used  Substance and Sexual Activity  . Alcohol use: No  . Drug use: No  . Sexual activity: Not Currently    Partners: Male  Other Topics Concern  . Not on file  Social History Narrative  . Not on file   Social Determinants of Health   Financial Resource Strain: Low Risk   . Difficulty of Paying Living Expenses: Not hard at all  Food Insecurity: No Food Insecurity  . Worried About Charity fundraiser in the Last Year: Never true  . Ran Out of Food in the Last Year: Never true  Transportation Needs: No Transportation Needs  . Lack of Transportation (Medical): No  . Lack of Transportation (Non-Medical): No  Physical Activity: Insufficiently Active  . Days of Exercise per Week: 3 days  . Minutes of Exercise per Session: 20 min  Stress: No Stress Concern  Present  . Feeling of Stress : Only a little  Social Connections: Moderately Isolated  . Frequency of Communication with Friends and Family: More than three times a week  . Frequency of Social Gatherings with Friends and Family: More than three times a week  . Attends Religious Services: 1 to 4 times per year  . Active Member of Clubs or Organizations: Not on file  . Attends Archivist Meetings: Never  . Marital Status: Widowed  Intimate Partner Violence: Not At Risk  . Fear of Current or Ex-Partner: No  . Emotionally Abused: No  . Physically Abused: No  . Sexually Abused: No    FAMILY HISTORY:  Family History  Problem Relation Age of Onset  . Heart failure Mother   . Heart failure Father   . Congestive Heart Failure Brother   . Tuberculosis Paternal Uncle   . Tuberculosis Maternal Grandmother   . Tuberculosis Maternal Grandfather   .  Congestive Heart Failure Brother     CURRENT MEDICATIONS:  Current Outpatient Medications  Medication Sig Dispense Refill  . Ado-Trastuzumab Emtansine (KADCYLA IV) Inject into the vein every 21 ( twenty-one) days.    Marland Kitchen amLODipine (NORVASC) 5 MG tablet Take 5 mg by mouth daily.    . ferrous sulfate 325 (65 FE) MG tablet Take 325 mg by mouth daily with breakfast.    . labetalol (NORMODYNE) 100 MG tablet Take 1 tablet (100 mg total) by mouth 2 (two) times daily. 60 tablet 12  . ondansetron (ZOFRAN) 4 MG tablet Take 1 tablet (4 mg total) by mouth every 8 (eight) hours as needed for nausea or vomiting. 30 tablet 3  . oxybutynin (DITROPAN) 5 MG tablet Take 5 mg by mouth daily.     Marland Kitchen PREVIDENT 5000 BOOSTER PLUS 1.1 % PSTE APPLY WITH TOOTHBRUSH AT BEDTIME AS DIRECTED.    Marland Kitchen simvastatin (ZOCOR) 40 MG tablet Take 40 mg by mouth daily.    . temazepam (RESTORIL) 30 MG capsule TAKE 1 CAPSULE BY MOUTH ONCE DAILY AT BEDTIME AS NEEDED FOR SLEEP (  MAY  INCREASE  DOSE  TO  2  CAPSULES  IF  1  CAPSULE  DOES  NOT  HELP.) 30 capsule 0   No current facility-administered medications for this visit.    ALLERGIES:  Allergies  Allergen Reactions  . Contrast Media [Iodinated Diagnostic Agents] Diarrhea    PHYSICAL EXAM:  Performance status (ECOG): 2 - Symptomatic, <50% confined to bed  Vitals:   09/16/20 0918  BP: 120/60  Pulse: (!) 53  Resp: 16  Temp: (!) 96.2 F (35.7 C)  SpO2: 99%   Wt Readings from Last 3 Encounters:  09/16/20 101 lb 3.2 oz (45.9 kg)  08/27/20 101 lb 6.4 oz (46 kg)  07/29/20 105 lb 12.8 oz (48 kg)   Physical Exam Vitals reviewed.  Constitutional:      Appearance: Normal appearance.  Cardiovascular:     Rate and Rhythm: Normal rate and regular rhythm.     Pulses: Normal pulses.     Heart sounds: Normal heart sounds.  Pulmonary:     Effort: Pulmonary effort is normal.     Breath sounds: Examination of the left-lower field reveals decreased breath sounds. Decreased breath  sounds present.  Chest:  Breasts:     Left: Axillary adenopathy (solitary sub-cm LN) present.      Comments: Port-a-Cath in R chest Musculoskeletal:     Right lower leg: No edema.     Left lower  leg: No edema.  Lymphadenopathy:     Upper Body:     Left upper body: Axillary adenopathy (solitary sub-cm LN) present.  Neurological:     General: No focal deficit present.     Mental Status: She is alert and oriented to person, place, and time.  Psychiatric:        Mood and Affect: Mood normal.        Behavior: Behavior normal.     LABORATORY DATA:  I have reviewed the labs as listed.  CBC Latest Ref Rng & Units 09/16/2020 08/27/2020 07/29/2020  WBC 4.0 - 10.5 K/uL 5.9 4.4 4.0  Hemoglobin 12.0 - 15.0 g/dL 11.8(L) 11.8(L) 11.1(L)  Hematocrit 36.0 - 46.0 % 37.5 36.3 34.3(L)  Platelets 150 - 400 K/uL 199 175 179   CMP Latest Ref Rng & Units 09/16/2020 08/27/2020 07/29/2020  Glucose 70 - 99 mg/dL 129(H) 134(H) 137(H)  BUN 8 - 23 mg/dL 29(H) 28(H) 26(H)  Creatinine 0.44 - 1.00 mg/dL 1.24(H) 1.19(H) 1.06(H)  Sodium 135 - 145 mmol/L 138 138 136  Potassium 3.5 - 5.1 mmol/L 4.6 4.0 4.0  Chloride 98 - 111 mmol/L 104 104 107  CO2 22 - 32 mmol/L $RemoveB'26 24 22  'LNXkZBEX$ Calcium 8.9 - 10.3 mg/dL 9.2 9.2 8.8(L)  Total Protein 6.5 - 8.1 g/dL 5.9(L) 6.2(L) 5.8(L)  Total Bilirubin 0.3 - 1.2 mg/dL 0.4 0.7 0.9  Alkaline Phos 38 - 126 U/L 47 51 49  AST 15 - 41 U/L $Remo'30 26 26  'KHMio$ ALT 0 - 44 U/L $Remo'17 12 18    'XYDjP$ DIAGNOSTIC IMAGING:  I have independently reviewed the scans and discussed with the patient. No results found.   ASSESSMENT:  1. Metastatic HER-2 positive left breast cancer: -Enhertu started on 10/22/2019. -CT CAP on 01/10/2020 showed response to therapy with decreasing lung nodules as well as supraclavicular and mediastinal adenopathy with no new evidence of metastatic disease. -CA 15-3 of 6.4 on 02/27/2020. -CT CAP on 04/08/2020 shows more or less stable lung and liver lesions. Left infrahilar mass measures 5.6  x 5.3 cm, previously 5.4 x 5.4 cm. No new areas were seen. Small dependent bilateral pleural effusions are new. -We will do nextCT scan WITHOUT PO CONTRAST. -PET scan on 06/22/2020 reviewed by me with bilateral scattered hypermetabolic pulmonary metastatic lesions slightly stable to improved from CT scan from 04/08/2020. No extrathoracic lesions. Small bilateral pleural effusions.  2. High risk drug monitoring: -Echo on 11/12/2019, EF 60-65%. -Echo on 02/24/2020 with EF 60-65%. -2D echo on 06/08/2020 with EF 65 to 70%.   PLAN:  1. Metastatic HER-2 positive left breast cancer: -Last treatment with Enhertu on 07/29/2020. - She reported on and off pain in the left axilla, almost every other day.  She also reported pain in the left lateral chest [place] in the lower ribs region. - Auscultation reveals decreased left breath sounds.  Possibly pleuritic pain. - Recommend PET CT scan.  I will hold her treatment until the scan.  2. High risk drug monitoring: -Last echo on 06/08/2020 was normal.  3.Leg swellings: -Albumin is low at 3.4 contributing to slight edema.  4. Difficulty sleeping: -Continue temazepam which is helping her to sleep through half the night.  5. Weight loss: -Her weight is stable at 101 since last visit. - Recommend drinking Ensure 1 can/day.  6. Left axillary pain: -She has intermittent pain, almost every other day in the left axillary region. -She also has new left lower lateral chest [place] region on and off.  Orders placed this encounter:  No orders of the defined types were placed in this encounter.    Derek Jack, MD [place] [phone]   I, Milinda Antis, am acting as a scribe for Dr. Sanda Linger.  I, Derek Jack MD, have reviewed the above documentation for accuracy and completeness, and I agree with the above.

## 2020-09-16 NOTE — Progress Notes (Signed)
Patient assessed and labs reviewed by Dr. Delton Coombes. No treatment today since patient has not had PET scan due to having to reschedule. Primary RN and pharmacy aware.

## 2020-09-17 LAB — CANCER ANTIGEN 15-3: CA 15-3: 7.2 U/mL (ref 0.0–25.0)

## 2020-09-25 ENCOUNTER — Ambulatory Visit (HOSPITAL_COMMUNITY)
Admission: RE | Admit: 2020-09-25 | Discharge: 2020-09-25 | Disposition: A | Discharge status: Home / Self Care | Payer: Medicare Other | Source: Ambulatory Visit | Attending: Hematology | Admitting: Hematology

## 2020-09-25 ENCOUNTER — Other Ambulatory Visit: Payer: Self-pay

## 2020-09-25 DIAGNOSIS — J90 Pleural effusion, not elsewhere classified: Secondary | ICD-10-CM | POA: Diagnosis not present

## 2020-09-25 DIAGNOSIS — C50912 Malignant neoplasm of unspecified site of left female breast: Secondary | ICD-10-CM | POA: Diagnosis not present

## 2020-09-25 DIAGNOSIS — K449 Diaphragmatic hernia without obstruction or gangrene: Secondary | ICD-10-CM | POA: Diagnosis not present

## 2020-09-25 LAB — GLUCOSE, CAPILLARY: Glucose-Capillary: 125 mg/dL — ABNORMAL HIGH (ref 70–99)

## 2020-09-25 MED ORDER — FLUDEOXYGLUCOSE F - 18 (FDG) INJECTION
5.0000 | Freq: Once | INTRAVENOUS | Status: AC | PRN
  Administered 2020-09-25: 5 via INTRAVENOUS

## 2020-09-28 ENCOUNTER — Other Ambulatory Visit: Payer: Self-pay

## 2020-09-28 ENCOUNTER — Other Ambulatory Visit (HOSPITAL_COMMUNITY): Payer: Self-pay

## 2020-09-28 ENCOUNTER — Inpatient Hospital Stay (HOSPITAL_BASED_OUTPATIENT_CLINIC_OR_DEPARTMENT_OTHER): Payer: Medicare Other | Admitting: Hematology

## 2020-09-28 ENCOUNTER — Inpatient Hospital Stay (HOSPITAL_COMMUNITY): Payer: Medicare Other

## 2020-09-28 VITALS — BP 121/55 | HR 53 | Temp 98.1°F | Resp 18

## 2020-09-28 VITALS — BP 105/70 | HR 52 | Temp 96.7°F | Resp 17 | Wt 99.6 lb

## 2020-09-28 DIAGNOSIS — R600 Localized edema: Secondary | ICD-10-CM | POA: Diagnosis not present

## 2020-09-28 DIAGNOSIS — I6529 Occlusion and stenosis of unspecified carotid artery: Secondary | ICD-10-CM | POA: Diagnosis not present

## 2020-09-28 DIAGNOSIS — K449 Diaphragmatic hernia without obstruction or gangrene: Secondary | ICD-10-CM | POA: Diagnosis not present

## 2020-09-28 DIAGNOSIS — M7989 Other specified soft tissue disorders: Secondary | ICD-10-CM | POA: Diagnosis not present

## 2020-09-28 DIAGNOSIS — K5900 Constipation, unspecified: Secondary | ICD-10-CM | POA: Diagnosis not present

## 2020-09-28 DIAGNOSIS — C50912 Malignant neoplasm of unspecified site of left female breast: Secondary | ICD-10-CM

## 2020-09-28 DIAGNOSIS — I2510 Atherosclerotic heart disease of native coronary artery without angina pectoris: Secondary | ICD-10-CM | POA: Diagnosis not present

## 2020-09-28 DIAGNOSIS — J90 Pleural effusion, not elsewhere classified: Secondary | ICD-10-CM | POA: Diagnosis not present

## 2020-09-28 DIAGNOSIS — I10 Essential (primary) hypertension: Secondary | ICD-10-CM | POA: Diagnosis not present

## 2020-09-28 DIAGNOSIS — R590 Localized enlarged lymph nodes: Secondary | ICD-10-CM | POA: Diagnosis not present

## 2020-09-28 DIAGNOSIS — R197 Diarrhea, unspecified: Secondary | ICD-10-CM | POA: Diagnosis not present

## 2020-09-28 DIAGNOSIS — M79622 Pain in left upper arm: Secondary | ICD-10-CM | POA: Diagnosis not present

## 2020-09-28 DIAGNOSIS — G479 Sleep disorder, unspecified: Secondary | ICD-10-CM | POA: Diagnosis not present

## 2020-09-28 DIAGNOSIS — Z5112 Encounter for antineoplastic immunotherapy: Secondary | ICD-10-CM | POA: Diagnosis not present

## 2020-09-28 DIAGNOSIS — C773 Secondary and unspecified malignant neoplasm of axilla and upper limb lymph nodes: Secondary | ICD-10-CM | POA: Diagnosis not present

## 2020-09-28 DIAGNOSIS — C50512 Malignant neoplasm of lower-outer quadrant of left female breast: Secondary | ICD-10-CM | POA: Diagnosis not present

## 2020-09-28 DIAGNOSIS — I700 Atherosclerosis of aorta: Secondary | ICD-10-CM | POA: Diagnosis not present

## 2020-09-28 DIAGNOSIS — R0989 Other specified symptoms and signs involving the circulatory and respiratory systems: Secondary | ICD-10-CM | POA: Diagnosis not present

## 2020-09-28 DIAGNOSIS — R109 Unspecified abdominal pain: Secondary | ICD-10-CM | POA: Diagnosis not present

## 2020-09-28 DIAGNOSIS — M8580 Other specified disorders of bone density and structure, unspecified site: Secondary | ICD-10-CM | POA: Diagnosis not present

## 2020-09-28 DIAGNOSIS — R6339 Other feeding difficulties: Secondary | ICD-10-CM | POA: Diagnosis not present

## 2020-09-28 DIAGNOSIS — M791 Myalgia, unspecified site: Secondary | ICD-10-CM | POA: Diagnosis not present

## 2020-09-28 DIAGNOSIS — K7689 Other specified diseases of liver: Secondary | ICD-10-CM | POA: Diagnosis not present

## 2020-09-28 DIAGNOSIS — R634 Abnormal weight loss: Secondary | ICD-10-CM | POA: Diagnosis not present

## 2020-09-28 DIAGNOSIS — R1032 Left lower quadrant pain: Secondary | ICD-10-CM | POA: Diagnosis not present

## 2020-09-28 LAB — CBC WITH DIFFERENTIAL/PLATELET
Abs Immature Granulocytes: 0.01 10*3/uL (ref 0.00–0.07)
Basophils Absolute: 0 10*3/uL (ref 0.0–0.1)
Basophils Relative: 1 %
Eosinophils Absolute: 0.1 10*3/uL (ref 0.0–0.5)
Eosinophils Relative: 2 %
HCT: 36.8 % (ref 36.0–46.0)
Hemoglobin: 11.8 g/dL — ABNORMAL LOW (ref 12.0–15.0)
Immature Granulocytes: 0 %
Lymphocytes Relative: 14 %
Lymphs Abs: 0.7 10*3/uL (ref 0.7–4.0)
MCH: 32.9 pg (ref 26.0–34.0)
MCHC: 32.1 g/dL (ref 30.0–36.0)
MCV: 102.5 fL — ABNORMAL HIGH (ref 80.0–100.0)
Monocytes Absolute: 0.4 10*3/uL (ref 0.1–1.0)
Monocytes Relative: 8 %
Neutro Abs: 3.6 10*3/uL (ref 1.7–7.7)
Neutrophils Relative %: 75 %
Platelets: 178 10*3/uL (ref 150–400)
RBC: 3.59 MIL/uL — ABNORMAL LOW (ref 3.87–5.11)
RDW: 13.4 % (ref 11.5–15.5)
WBC: 4.8 10*3/uL (ref 4.0–10.5)
nRBC: 0 % (ref 0.0–0.2)

## 2020-09-28 LAB — COMPREHENSIVE METABOLIC PANEL
ALT: 17 U/L (ref 0–44)
AST: 31 U/L (ref 15–41)
Albumin: 3.4 g/dL — ABNORMAL LOW (ref 3.5–5.0)
Alkaline Phosphatase: 48 U/L (ref 38–126)
Anion gap: 11 (ref 5–15)
BUN: 24 mg/dL — ABNORMAL HIGH (ref 8–23)
CO2: 24 mmol/L (ref 22–32)
Calcium: 9.1 mg/dL (ref 8.9–10.3)
Chloride: 103 mmol/L (ref 98–111)
Creatinine, Ser: 1.05 mg/dL — ABNORMAL HIGH (ref 0.44–1.00)
GFR, Estimated: 50 mL/min — ABNORMAL LOW (ref >60)
Glucose, Bld: 154 mg/dL — ABNORMAL HIGH (ref 70–99)
Potassium: 4.3 mmol/L (ref 3.5–5.1)
Sodium: 138 mmol/L (ref 135–145)
Total Bilirubin: 0.7 mg/dL (ref 0.3–1.2)
Total Protein: 5.8 g/dL — ABNORMAL LOW (ref 6.5–8.1)

## 2020-09-28 LAB — MAGNESIUM: Magnesium: 2 mg/dL (ref 1.7–2.4)

## 2020-09-28 MED ORDER — SODIUM CHLORIDE 0.9 % IV SOLN
10.0000 mg | Freq: Once | INTRAVENOUS | Status: AC
  Administered 2020-09-28: 10 mg via INTRAVENOUS
  Filled 2020-09-28: qty 10

## 2020-09-28 MED ORDER — ACETAMINOPHEN 325 MG PO TABS
650.0000 mg | ORAL_TABLET | Freq: Once | ORAL | Status: AC
  Administered 2020-09-28: 650 mg via ORAL
  Filled 2020-09-28: qty 2

## 2020-09-28 MED ORDER — HYDROCODONE-ACETAMINOPHEN 5-325 MG PO TABS: 1.0000 | ORAL_TABLET | Freq: Four times a day (QID) | ORAL | 0 refills | Status: DC | PRN

## 2020-09-28 MED ORDER — SODIUM CHLORIDE 0.9% FLUSH
10.0000 mL | INTRAVENOUS | Status: DC | PRN
  Administered 2020-09-28 (×2): 10 mL

## 2020-09-28 MED ORDER — FAM-TRASTUZUMAB DERUXTECAN-NXKI CHEMO 100 MG IV SOLR
144.0000 mg | Freq: Once | INTRAVENOUS | Status: AC
  Administered 2020-09-28: 144 mg via INTRAVENOUS
  Filled 2020-09-28: qty 7.2

## 2020-09-28 MED ORDER — DEXTROSE 5 % IV SOLN: Freq: Once | INTRAVENOUS | Status: AC

## 2020-09-28 MED ORDER — MEGESTROL ACETATE 400 MG/10ML PO SUSP: 400.0000 mg | Freq: Two times a day (BID) | ORAL | 0 refills | Status: AC

## 2020-09-28 MED ORDER — DIPHENHYDRAMINE HCL 25 MG PO CAPS
25.0000 mg | ORAL_CAPSULE | Freq: Once | ORAL | Status: AC
  Administered 2020-09-28: 25 mg via ORAL
  Filled 2020-09-28: qty 1

## 2020-09-28 MED ORDER — HEPARIN SOD (PORK) LOCK FLUSH 100 UNIT/ML IV SOLN
500.0000 [IU] | Freq: Once | INTRAVENOUS | Status: AC | PRN
  Administered 2020-09-28: 500 [IU]

## 2020-09-28 MED ORDER — PALONOSETRON HCL INJECTION 0.25 MG/5ML
0.2500 mg | Freq: Once | INTRAVENOUS | Status: AC
  Administered 2020-09-28: 0.25 mg via INTRAVENOUS
  Filled 2020-09-28: qty 5

## 2020-09-28 NOTE — Progress Notes (Signed)
Patient was assessed by Dr. Katragadda and labs have been reviewed.  Patient is okay to proceed with treatment today. Primary RN and pharmacy aware.   

## 2020-09-28 NOTE — Patient Instructions (Addendum)
[place] at [facility] Discharge Instructions  You were seen today by Dr. Delton Coombes. He went over your recent results and scans. You received your treatment today. You were prescribed Norco 5/325 to take as needed for your pain. You will also be prescribed Megace to take 2 teaspoons twice daily to stimulate your appetite. Drink at least 1 can of high-protein Boost/Ensure daily to maintain your weight and energy. Dr. Delton Coombes will see you back in 3 weeks for labs and follow up.   Thank you for choosing [place] at [facility] to provide your oncology and hematology care.  To afford each patient quality time with our provider, please arrive at least 15 minutes before your scheduled appointment time.   If you have a lab appointment with the [facility] please come in thru the Main Entrance and check in at the main information desk  You need to re-schedule your appointment should you arrive 10 or more minutes late.  We strive to give you quality time with our providers, and arriving late affects you and other patients whose appointments are after yours.  Also, if you no show three or more times for appointments you may be dismissed from the clinic at the providers discretion.     Again, thank you for choosing [name].  Our hope is that these requests will decrease the amount of time that you wait before being seen by our physicians.       _____________________________________________________________  Should you have questions after your visit to [facility], please contact our office at [phone] between the hours of 8:00 a.m. and 4:30 p.m.  Voicemails left after 4:00 p.m. will not be returned until the following business day.  For prescription refill requests, have your pharmacy contact our office and allow 72 hours.    Cancer Center Support Programs:   > Cancer Support Group  2nd Tuesday of the month 1pm-2pm,  Journey Room

## 2020-09-28 NOTE — Patient Instructions (Signed)
[facility] at [facility] Discharge Instructions  Labs drawn from portacath today   Thank you for choosing [facility] at [facility] to provide your oncology and hematology care.  To afford each patient quality time with our provider, please arrive at least 15 minutes before your scheduled appointment time.   If you have a lab appointment with the Cancer Center please come in thru the Main Entrance and check in at the main information desk.  You need to re-schedule your appointment should you arrive 10 or more minutes late.  We strive to give you quality time with our providers, and arriving late affects you and other patients whose appointments are after yours.  Also, if you no show three or more times for appointments you may be dismissed from the clinic at the providers discretion.     Again, thank you for choosing [facility].  Our hope is that these requests will decrease the amount of time that you wait before being seen by our physicians.       _____________________________________________________________  Should you have questions after your visit to [facility], please contact our office at [phone] and follow the prompts.  Our office hours are 8:00 a.m. and 4:30 p.m. Monday - Friday.  Please note that voicemails left after 4:00 p.m. may not be returned until the following business day.  We are closed weekends and major holidays.  You do have access to a nurse 24-7, just call the main number to the clinic [phone] and do not press any options, hold on the line and a nurse will answer the phone.    For prescription refill requests, have your pharmacy contact our office and allow 72 hours.    Due to Covid, you will need to wear a mask upon entering the hospital. If you do not have a mask, a mask will be given to you at the Main Entrance upon arrival. For doctor visits, patients may have 1 support person age 18  or older with them. For treatment visits, patients can not have anyone with them due to social distancing guidelines and our immunocompromised population.     

## 2020-09-28 NOTE — Progress Notes (Signed)
1020 Labs reviewed with and pt seen by Dr. Delton Coombes and pt approved for Enhertu infusion today per MD                                  Carol Duarte tolerated Enhertu infusion well without complaints or incident. VSS upon discharge. Pt discharged via wheelchair in satisfactory condition

## 2020-09-28 NOTE — Progress Notes (Signed)
[place] [address] [place], [place] [address]   CLINIC:  Medical Oncology/Hematology  PCP:  [name], MD Seth [address] / [place] [id number] [phone]   REASON FOR VISIT:  Follow-up for IDC of left breast  PRIOR THERAPY:  1. Lumpectomy on 04/24/2017. 2. Left modified radical mastectomy on 05/28/2018. 3. Trastuzumab x 10 cycles, pertuzumab x 6 cycles, emtansine x 8 cycles.  NGS Results: Not done  CURRENT THERAPY: Enhertu & Aloxi every 3 weeks  BRIEF ONCOLOGIC HISTORY:  Oncology History  Invasive ductal carcinoma of left breast ([place])  03/30/2017 Initial Diagnosis   Invasive ductal carcinoma of left breast ([place])   08/09/2018 - 02/28/2019 Chemotherapy   The patient had lapatinib (TYKERB) 250 MG tablet, 1,000 mg, Oral, Daily, 1 of 1 cycle, Start date: --, End date: -- trastuzumab (HERCEPTIN) 500 mg in sodium chloride 0.9 % 250 mL chemo infusion, 525 mg, Intravenous,  Once, 10 of 10 cycles Administration: 500 mg (08/09/2018), 350 mg (09/06/2018), 350 mg (09/27/2018), 378 mg (10/18/2018), 378 mg (11/15/2018), 378 mg (12/06/2018), 378 mg (12/28/2018), 378 mg (01/18/2019), 378 mg (02/08/2019) pertuzumab (PERJETA) 840 mg in sodium chloride 0.9 % 250 mL chemo infusion, 840 mg (100 % of original dose 840 mg), Intravenous, Once, 6 of 6 cycles Dose modification: 840 mg (original dose 840 mg, Cycle 5), 420 mg (original dose 420 mg, Cycle 6) Administration: 840 mg (11/15/2018), 420 mg (12/06/2018), 420 mg (12/28/2018), 420 mg (01/18/2019), 420 mg (02/08/2019)  for chemotherapy treatment.    09/18/2018 Genetic Testing   Negative genetic testing for the breast and gyn cancer panel.  The Breast/GYN gene panel offered by [facility] includes sequencing and rearrangement analysis for the following 23 genes:  ATM, BRCA1, BRCA2, BRIP1, CDH1, CHEK2, EPCAM, MLH1, MSH2, MSH6, NBN, NF1, PALB2, PMS2, PTEN, RAD51C, RAD51D, STK11, and TP53.   The report date is 09/18/2018.   03/12/2019 -  08/20/2019 Chemotherapy   The patient had ado-trastuzumab emtansine (KADCYLA) 140 mg in sodium chloride 0.9 % 250 mL chemo infusion, 2.4 mg/kg = 140 mg (100 % of original dose 2.4 mg/kg), Intravenous, Once, 8 of 9 cycles Dose modification: 2.4 mg/kg (original dose 2.4 mg/kg, Cycle 1, Reason: Patient Age), 3 mg/kg (original dose 2.4 mg/kg, Cycle 3, Reason: Provider Judgment) Administration: 140 mg (03/12/2019), 180 mg (04/23/2019), 180 mg (06/18/2019), 180 mg (04/02/2019), 180 mg (05/28/2019), 180 mg (07/09/2019), 180 mg (07/30/2019), 180 mg (08/20/2019)  for chemotherapy treatment.    10/22/2019 -  Chemotherapy    Patient is on Treatment Plan: BREAST METASTATIC FAM-TRASTUZUMAB DERUXTECAN-NXKI (ENHERTU) Q21D        CANCER STAGING: Cancer Staging No matching staging information was found for the patient.  INTERVAL HISTORY:  Carol Duarte, a 87 y.o. female, returns for routine follow-up and consideration for next cycle of immunotherapy. Carol Duarte was last seen on 09/16/2020.  Due for cycle #15 of Enhertu and Aloxi today.   Today she is accompanied by daughter. Overall, she tells me she has been feeling okay. She denies having any pain today, though she continues having sporadic episodes of pain ranging from her left armpit to left flank pain. The pain comes at least once daily and usually Aspercreme helps, but other times even Tylenol won't help the pain. She denies having left hip pain since her surgery on 09/03/2019. She has some Norco since her hip surgery which she takes which help the left flank pain and denies feeling drowsy taking it. Her appetite is okay  and is a picky eater; she forces herself to eat breakfast and lunch, along with a snack in the afternoon. She eats better when she takes Zofran preemptively.   Overall, she feels ready for next cycle of immunotherapy today.    REVIEW OF SYSTEMS:  Review of Systems  Constitutional: Positive for appetite change (75%) and fatigue (25%).   Gastrointestinal: Positive for abdominal pain (intermittent L flank pain), constipation, diarrhea and nausea.  Musculoskeletal: Positive for myalgias (intermittent in L axilla).  All other systems reviewed and are negative.   PAST MEDICAL/SURGICAL HISTORY:  Past Medical History:  Diagnosis Date  . Cancer ([place])    left breast  . History of kidney stones   . Hypercholesteremia   . Hypertension    Past Surgical History:  Procedure Laterality Date  . APPENDECTOMY    . brain cyst removed  2011  . BREAST BIOPSY Left 03/28/2018   Procedure: BREAST BIOPSY;  Surgeon: [name], MD;  Location: [facility];  Service: General;  Laterality: Left;  . CATARACT EXTRACTION W/PHACO Left 11/09/2015   Procedure: CATARACT EXTRACTION PHACO AND INTRAOCULAR LENS PLACEMENT LEFT EYE cde=8.97;  Surgeon: [name], MD;  Location: [facility];  Service: Ophthalmology;  Laterality: Left;  . CATARACT EXTRACTION W/PHACO Right 02/04/2019   Procedure: CATARACT EXTRACTION PHACO AND INTRAOCULAR LENS PLACEMENT (IOC);  Surgeon: [name], MD;  Location: [facility];  Service: Ophthalmology;  Laterality: Right;  CDE: 15.19  . EYE SURGERY     KPE left  . MASTECTOMY MODIFIED RADICAL Left 05/28/2018   Procedure: LEFT MODIFIED RADICAL MASTECTOMY;  Surgeon: [name], MD;  Location: [facility];  Service: General;  Laterality: Left;  Marland Kitchen MASTECTOMY, PARTIAL Left 04/24/2017   Procedure: MASTECTOMY PARTIAL;  Surgeon: [name], MD;  Location: [facility];  Service: General;  Laterality: Left;  . ORIF FEMUR FRACTURE Left 09/03/2019   Procedure: OPEN REDUCTION INTERNAL FIXATION (ORIF) LEFT HIP FEMUR FRACTURE;  Surgeon: Paralee Cancel, MD;  Location: [facility];  Service: Orthopedics;  Laterality: Left;  . PORTACATH PLACEMENT Right 08/08/2018   Procedure: INSERTION PORT-A-CATH;  Surgeon: [name], MD;  Location: [facility];  Service: General;  Laterality: Right;  . TONSILLECTOMY    . TONSILLECTOMY AND ADENOIDECTOMY      SOCIAL HISTORY:  Social  History   Socioeconomic History  . Marital status: Widowed    Spouse name: Not on file  . Number of children: Not on file  . Years of education: Not on file  . Highest education level: Not on file  Occupational History  . Not on file  Tobacco Use  . Smoking status: Never Smoker  . Smokeless tobacco: Never Used  Vaping Use  . Vaping Use: Never used  Substance and Sexual Activity  . Alcohol use: No  . Drug use: No  . Sexual activity: Not Currently    Partners: Male  Other Topics Concern  . Not on file  Social History Narrative  . Not on file   Social Determinants of Health   Financial Resource Strain: Low Risk   . Difficulty of Paying Living Expenses: Not hard at all  Food Insecurity: No Food Insecurity  . Worried About Charity fundraiser in the Last Year: Never true  . Ran Out of Food in the Last Year: Never true  Transportation Needs: No Transportation Needs  . Lack of Transportation (Medical): No  . Lack of Transportation (Non-Medical): No  Physical Activity: Insufficiently Active  . Days of Exercise per Week: 3 days  .  Minutes of Exercise per Session: 20 min  Stress: No Stress Concern Present  . Feeling of Stress : Only a little  Social Connections: Moderately Isolated  . Frequency of Communication with Friends and Family: More than three times a week  . Frequency of Social Gatherings with Friends and Family: More than three times a week  . Attends Religious Services: 1 to 4 times per year  . Active Member of Clubs or Organizations: Not on file  . Attends Archivist Meetings: Never  . Marital Status: Widowed  Intimate Partner Violence: Not At Risk  . Fear of Current or Ex-Partner: No  . Emotionally Abused: No  . Physically Abused: No  . Sexually Abused: No    FAMILY HISTORY:  Family History  Problem Relation Age of Onset  . Heart failure Mother   . Heart failure Father   . Congestive Heart Failure Brother   . Tuberculosis Paternal Uncle   .  Tuberculosis Maternal Grandmother   . Tuberculosis Maternal Grandfather   . Congestive Heart Failure Brother     CURRENT MEDICATIONS:  Current Outpatient Medications  Medication Sig Dispense Refill  . Ado-Trastuzumab Emtansine (KADCYLA IV) Inject into the vein every 21 ( twenty-one) days.    Marland Kitchen amLODipine (NORVASC) 5 MG tablet Take 5 mg by mouth daily.    . ferrous sulfate 325 (65 FE) MG tablet Take 325 mg by mouth daily with breakfast.    . labetalol (NORMODYNE) 100 MG tablet Take 1 tablet (100 mg total) by mouth 2 (two) times daily. 60 tablet 12  . megestrol (MEGACE) 400 MG/10ML suspension Take 10 mLs (400 mg total) by mouth 2 (two) times daily. 480 mL 0  . ondansetron (ZOFRAN) 4 MG tablet Take 1 tablet (4 mg total) by mouth every 8 (eight) hours as needed for nausea or vomiting. 30 tablet 3  . oxybutynin (DITROPAN) 5 MG tablet Take 5 mg by mouth daily.     Marland Kitchen PREVIDENT 5000 BOOSTER PLUS 1.1 % PSTE APPLY WITH TOOTHBRUSH AT BEDTIME AS DIRECTED.    Marland Kitchen simvastatin (ZOCOR) 40 MG tablet Take 40 mg by mouth daily.    . temazepam (RESTORIL) 30 MG capsule TAKE 1 CAPSULE BY MOUTH ONCE DAILY AT BEDTIME AS NEEDED FOR SLEEP (  MAY  INCREASE  DOSE  TO  2  CAPSULES  IF  1  CAPSULE  DOES  NOT  HELP.) 30 capsule 0  . HYDROcodone-acetaminophen (NORCO/VICODIN) 5-325 MG tablet Take 1 tablet by mouth every 6 (six) hours as needed for moderate pain or severe pain. 30 tablet 0   No current facility-administered medications for this visit.    ALLERGIES:  Allergies  Allergen Reactions  . Contrast Media [Iodinated Diagnostic Agents] Diarrhea    PHYSICAL EXAM:  Performance status (ECOG): 2 - Symptomatic, <50% confined to bed  Vitals:   09/28/20 0907  BP: 105/70  Pulse: (!) 52  Resp: 17  Temp: (!) 96.7 F (35.9 C)  SpO2: 99%   Wt Readings from Last 3 Encounters:  09/28/20 99 lb 9.6 oz (45.2 kg)  09/16/20 101 lb 3.2 oz (45.9 kg)  08/27/20 101 lb 6.4 oz (46 kg)   Physical Exam Vitals reviewed.   Constitutional:      Appearance: Normal appearance.  Cardiovascular:     Rate and Rhythm: Normal rate and regular rhythm.     Pulses: Normal pulses.     Heart sounds: Normal heart sounds.  Pulmonary:     Effort: Pulmonary effort is  normal.     Breath sounds: Normal breath sounds.  Chest:     Comments: Port-a-Cath in R chest Abdominal:     Palpations: Abdomen is soft.     Tenderness: There is no abdominal tenderness.  Neurological:     General: No focal deficit present.     Mental Status: She is alert and oriented to person, place, and time.  Psychiatric:        Mood and Affect: Mood normal.        Behavior: Behavior normal.     LABORATORY DATA:  I have reviewed the labs as listed.  CBC Latest Ref Rng & Units 09/28/2020 09/16/2020 08/27/2020  WBC 4.0 - 10.5 K/uL 4.8 5.9 4.4  Hemoglobin 12.0 - 15.0 g/dL 11.8(L) 11.8(L) 11.8(L)  Hematocrit 36.0 - 46.0 % 36.8 37.5 36.3  Platelets 150 - 400 K/uL 178 199 175   CMP Latest Ref Rng & Units 09/28/2020 09/16/2020 08/27/2020  Glucose 70 - 99 mg/dL 154(H) 129(H) 134(H)  BUN 8 - 23 mg/dL 24(H) 29(H) 28(H)  Creatinine 0.44 - 1.00 mg/dL 1.05(H) 1.24(H) 1.19(H)  Sodium 135 - 145 mmol/L 138 138 138  Potassium 3.5 - 5.1 mmol/L 4.3 4.6 4.0  Chloride 98 - 111 mmol/L 103 104 104  CO2 22 - 32 mmol/L '24 26 24  ' Calcium 8.9 - 10.3 mg/dL 9.1 9.2 9.2  Total Protein 6.5 - 8.1 g/dL 5.8(L) 5.9(L) 6.2(L)  Total Bilirubin 0.3 - 1.2 mg/dL 0.7 0.4 0.7  Alkaline Phos 38 - 126 U/L 48 47 51  AST 15 - 41 U/L '31 30 26  ' ALT 0 - 44 U/L '17 17 12    ' DIAGNOSTIC IMAGING:  I have independently reviewed the scans and discussed with the patient. NM PET Image Restag (PS) Skull Base To Thigh  Result Date: 09/25/2020 CLINICAL DATA:  Subsequent treatment strategy for restaging of left-sided breast cancer. Left mastectomy in 2019. EXAM: NUCLEAR MEDICINE PET SKULL BASE TO THIGH TECHNIQUE: 5.0 mCi F-18 FDG was injected intravenously. Full-ring PET imaging was performed  from the skull base to thigh after the radiotracer. CT data was obtained and used for attenuation correction and anatomic localization. Fasting blood glucose: 125 mg/dl COMPARISON:  06/22/2020 FINDINGS: Mediastinal blood pool activity: SUV max 2.3 Liver activity: SUV max NA NECK: Right supraclavicular/low jugular hypermetabolic partially calcified node of 8 mm and a S.U.V. max of 5.8 on 44/4. Compare 9 mm and a S.U.V. max of 5.4 on the prior exam. Incidental CT findings: Carotid atherosclerosis. Incompletely imaged VP shunt catheter. CHEST: Subcarinal node measures 1.0 cm and a S.U.V. max of 8.8 on 68/4 versus 7 mm and a S.U.V. max of 7.2 on the prior exam. Deep left axillary partially calcified node measures 9 mm and a S.U.V. max of 5.2 on 57/4 versus 11 mm and a S.U.V. max of 5.1 on the prior exam. Dominant central left lower lobe lung mass measures 5.9 cm and a S.U.V. max of 12.1 on 75/4 versus 5.7 cm and a S.U.V. max of 10.9 on the prior exam (when remeasured). Right lower lobe lung mass measures 3.1 cm and a S.U.V. max of 14.0 on 44/8 versus 2.8 cm and a S.U.V. max of 11.3 on the prior exam. Incidental CT findings: Tiny bilateral pleural effusions, significantly decreased. Aortic and multivessel coronary artery atherosclerosis. Trace pericardial fluid is likely physiologic. Left axillary node dissection and mastectomy. Index superior segment left lower lobe 1.0 cm nodule on 26/8, similar to on the prior exam (when remeasured). Posterior  right upper lobe 2.3 cm nodule on 37/8 measures 2.2 cm on the prior. Moderate hiatal hernia. ABDOMEN/PELVIS: No abdominopelvic parenchymal or nodal hypermetabolism. Incidental CT findings: Normal adrenal glands. Lower pole right and high left hepatic lobe cysts. VP shunt catheter terminates in the high right pelvis anteriorly. Abdominal aortic atherosclerosis. Colonic stool burden suggests constipation. Pelvic floor laxity. SKELETON: No abnormal marrow activity. Incidental CT  findings: Osteopenia.  Left proximal femur fixation. IMPRESSION: 1. Thoracic (with isolated low right jugular) nodal and pulmonary hypermetabolic metastasis. Mildly progressive, as evidenced by enlargement and increase in hypermetabolism. 2. No new sites of disease.  No subdiaphragmatic metastasis. 3. Decreased tiny bilateral pleural effusions. 4. Hiatal hernia. Electronically Signed   By: Abigail Miyamoto M.D.   On: 09/25/2020 15:12     ASSESSMENT:  1. Metastatic HER-2 positive left breast cancer: -Enhertu started on 10/22/2019. -CT CAP on 01/10/2020 showed response to therapy with decreasing lung nodules as well as supraclavicular and mediastinal adenopathy with no new evidence of metastatic disease. -CA 15-3 of 6.4 on 02/27/2020. -CT CAP on 04/08/2020 shows more or less stable lung and liver lesions. Left infrahilar mass measures 5.6 x 5.3 cm, previously 5.4 x 5.4 cm. No new areas were seen. Small dependent bilateral pleural effusions are new. -We will do nextCT scan WITHOUT PO CONTRAST. -PET scan on 06/22/2020 reviewed by me with bilateral scattered hypermetabolic pulmonary metastatic lesions slightly stable to improved from CT scan from 04/08/2020. No extrathoracic lesions. Small bilateral pleural effusions. -PET CT scan on 09/25/2020 showed equivocal response with some lesions decreasing in size and some lesions increasing in size up to 3 mm.  2. High risk drug monitoring: -Echo on 11/12/2019, EF 60-65%. -Echo on 02/24/2020 with EF 60-65%. -2D echo on 06/08/2020 with EF 65 to 70%.   PLAN:  1. Metastatic HER-2 positive left breast cancer: -I have reviewed images of the PET CT scan and results with the patient and her daughter. -2 of the lesions have grown between 1-3 mm in size.  There is slight worsening of SUV value on few of the lesions.  Few lesions have decreased in size.  Her last treatment was on 07/29/2020. -No strict progression based on RECIST criteria.  Hence recommended continuing  same regimen. -Reviewed labs today which showed normal LFTs.  She will proceed with her treatment today.  RTC 3 weeks for follow-up.  2. High risk drug monitoring: -Last echo on 06/08/2020 was normal.  3.Leg swellings: -Albumin is slightly low at 3.4, contributing to leg swellings.  4. Difficulty sleeping: -Continue to temazepam which is helping.  5. Weight loss: -She reports that she is eating well but has lost some weight. -We will start her on Megace 400 mg twice daily.  6. Left axillary pain: -She has intermittent pain at least once a day.  Tylenol occasionally does not help. -We will send prescription for hydrocodone 5/320 5/2 to 1 tablet as needed.   Orders placed this encounter:  No orders of the defined types were placed in this encounter.    Derek Jack, MD [place] [phone]   I, Milinda Antis, am acting as a scribe for Dr. Sanda Linger.  I, Derek Jack MD, have reviewed the above documentation for accuracy and completeness, and I agree with the above.

## 2020-09-28 NOTE — Progress Notes (Signed)
Patient w/>10% weight loss.  Received ok to reduce dose based on new weight of 45.2 kg.  T.O. Dr Alphonzo Severance Aurora Mask, PharmD 09/29/19 @ 1058

## 2020-09-28 NOTE — Patient Instructions (Signed)
[facility] Discharge Instructions for Patients Receiving Chemotherapy   Beginning January 23rd 2017 lab work for the [facility] will be done in the  Main lab at [facility] on 1st floor. If you have a lab appointment with the [facility] please come in thru the  Main Entrance and check in at the main information desk   Today you received the following chemotherapy agents Enhertu. Follow-up as scheduled  To help prevent nausea and vomiting after your treatment, we encourage you to take your nausea medication   If you develop nausea and vomiting, or diarrhea that is not controlled by your medication, call the clinic.  The clinic phone number is [phone]. Office hours are Monday-Friday 8:30am-5:00pm.  BELOW ARE SYMPTOMS THAT SHOULD BE REPORTED IMMEDIATELY:  *FEVER GREATER THAN 101.0 F  *CHILLS WITH OR WITHOUT FEVER  NAUSEA AND VOMITING THAT IS NOT CONTROLLED WITH YOUR NAUSEA MEDICATION  *UNUSUAL SHORTNESS OF BREATH  *UNUSUAL BRUISING OR BLEEDING  TENDERNESS IN MOUTH AND THROAT WITH OR WITHOUT PRESENCE OF ULCERS  *URINARY PROBLEMS  *BOWEL PROBLEMS  UNUSUAL RASH Items with * indicate a potential emergency and should be followed up as soon as possible. If you have an emergency after office hours please contact your primary care physician or go to the nearest emergency department.  Please call the clinic during office hours if you have any questions or concerns.   You may also contact the Patient Navigator at [phone] should you have any questions or need assistance in obtaining follow up care.      Resources For Cancer Patients and their Caregivers ? American Cancer Society: Can assist with transportation, wigs, general needs, runs Look Good Feel Better.        660-571-5639 ? Cancer Care: Provides financial assistance, online support groups, medication/co-pay assistance.  1-800-813-HOPE 6145152461) ? [facility] Assists [place] cancer patients and their families through emotional , educational and financial support.  (262)706-5012 ? [place] DSS Where to apply for food stamps, Medicaid and utility assistance. 216-106-2514 ? [facility]: Transportation to medical appointments. 202-887-1791 ? Social Security Administration: May apply for disability if have a Stage IV cancer. 530 758 4162 (781)816-1511 ? [employer], Disability and Transit Services: Assists with nutrition, care and transit needs. (737)004-0495

## 2020-10-01 ENCOUNTER — Other Ambulatory Visit (HOSPITAL_COMMUNITY): Payer: Self-pay | Admitting: Hematology

## 2020-10-08 DIAGNOSIS — I10 Essential (primary) hypertension: Secondary | ICD-10-CM | POA: Diagnosis not present

## 2020-10-08 DIAGNOSIS — C50919 Malignant neoplasm of unspecified site of unspecified female breast: Secondary | ICD-10-CM | POA: Diagnosis not present

## 2020-10-15 NOTE — Progress Notes (Signed)
Carol Duarte, [place] [id number]   CLINIC:  Medical Oncology/Hematology  PCP:  [name], MD [place] / [place] [address] [phone]   REASON FOR VISIT:  Follow-up for IDC of left breast  PRIOR THERAPY:  1. Lumpectomy on 04/24/2017. 2. Left modified radical mastectomy on 05/28/2018. 3. Trastuzumab x 10 cycles, pertuzumab x 6 cycles, emtansine x 8 cycles.  NGS Results: Not done  CURRENT THERAPY: Enhertu & Aloxi every 3 weeks  BRIEF ONCOLOGIC HISTORY:  Oncology History  Invasive ductal carcinoma of left breast ([place])  03/30/2017 Initial Diagnosis   Invasive ductal carcinoma of left breast ([place])   08/09/2018 - 02/28/2019 Chemotherapy   The patient had lapatinib (TYKERB) 250 MG tablet, 1,000 mg, Oral, Daily, 1 of 1 cycle, Start date: --, End date: -- trastuzumab (HERCEPTIN) 500 mg in sodium chloride 0.9 % 250 mL chemo infusion, 525 mg, Intravenous,  Once, 10 of 10 cycles Administration: 500 mg (08/09/2018), 350 mg (09/06/2018), 350 mg (09/27/2018), 378 mg (10/18/2018), 378 mg (11/15/2018), 378 mg (12/06/2018), 378 mg (12/28/2018), 378 mg (01/18/2019), 378 mg (02/08/2019) pertuzumab (PERJETA) 840 mg in sodium chloride 0.9 % 250 mL chemo infusion, 840 mg (100 % of original dose 840 mg), Intravenous, Once, 6 of 6 cycles Dose modification: 840 mg (original dose 840 mg, Cycle 5), 420 mg (original dose 420 mg, Cycle 6) Administration: 840 mg (11/15/2018), 420 mg (12/06/2018), 420 mg (12/28/2018), 420 mg (01/18/2019), 420 mg (02/08/2019)  for chemotherapy treatment.    09/18/2018 Genetic Testing   Negative genetic testing for the breast and gyn cancer panel.  The Breast/GYN gene panel offered by [facility] includes sequencing and rearrangement analysis for the following 23 genes:  ATM, BRCA1, BRCA2, BRIP1, CDH1, CHEK2, EPCAM, MLH1, MSH2, MSH6, NBN, NF1, PALB2, PMS2, PTEN, RAD51C, RAD51D, STK11, and TP53.   The report date is 09/18/2018.   03/12/2019 -  08/20/2019 Chemotherapy   The patient had ado-trastuzumab emtansine (KADCYLA) 140 mg in sodium chloride 0.9 % 250 mL chemo infusion, 2.4 mg/kg = 140 mg (100 % of original dose 2.4 mg/kg), Intravenous, Once, 8 of 9 cycles Dose modification: 2.4 mg/kg (original dose 2.4 mg/kg, Cycle 1, Reason: Patient Age), 3 mg/kg (original dose 2.4 mg/kg, Cycle 3, Reason: Provider Judgment) Administration: 140 mg (03/12/2019), 180 mg (04/23/2019), 180 mg (06/18/2019), 180 mg (04/02/2019), 180 mg (05/28/2019), 180 mg (07/09/2019), 180 mg (07/30/2019), 180 mg (08/20/2019)  for chemotherapy treatment.    10/22/2019 -  Chemotherapy    Patient is on Treatment Plan: BREAST METASTATIC FAM-TRASTUZUMAB DERUXTECAN-NXKI (ENHERTU) Q21D        CANCER STAGING: Cancer Staging No matching staging information was found for the patient.  INTERVAL HISTORY:  [name], a 87 y.o. female, returns for routine follow-up and consideration for next cycle of immunotherapy. Carol Duarte was last seen on 09/28/2020.  Due for cycle #16 of Enhertu today.   Overall, she tells me she has been feeling pretty well.  Reported improvement in her pain in the last 7 to 10 days in the axillary region and left-sided ribs.  She is also drinking 1 can of Ensure daily.  She gained 3 pounds.  Denies any falls.  Overall, she feels ready for next cycle of immunotherapy today.    REVIEW OF SYSTEMS:  Review of Systems  Constitutional: Positive for fatigue.  All other systems reviewed and are negative.   PAST MEDICAL/SURGICAL HISTORY:  Past Medical History:  Diagnosis Date  . Cancer (  [place])    left breast  . History of kidney stones   . Hypercholesteremia   . Hypertension    Past Surgical History:  Procedure Laterality Date  . APPENDECTOMY    . brain cyst removed  2011  . BREAST BIOPSY Left 03/28/2018   Procedure: BREAST BIOPSY;  Surgeon: [name], MD;  Location: [facility];  Service: General;  Laterality: Left;  . CATARACT EXTRACTION  W/PHACO Left 11/09/2015   Procedure: CATARACT EXTRACTION PHACO AND INTRAOCULAR LENS PLACEMENT LEFT EYE cde=8.97;  Surgeon: [name], MD;  Location: [facility];  Service: Ophthalmology;  Laterality: Left;  . CATARACT EXTRACTION W/PHACO Right 02/04/2019   Procedure: CATARACT EXTRACTION PHACO AND INTRAOCULAR LENS PLACEMENT (IOC);  Surgeon: [name], MD;  Location: [facility];  Service: Ophthalmology;  Laterality: Right;  CDE: 15.19  . EYE SURGERY     KPE left  . MASTECTOMY MODIFIED RADICAL Left 05/28/2018   Procedure: LEFT MODIFIED RADICAL MASTECTOMY;  Surgeon: [name], MD;  Location: [facility];  Service: General;  Laterality: Left;  Marland Kitchen MASTECTOMY, PARTIAL Left 04/24/2017   Procedure: MASTECTOMY PARTIAL;  Surgeon: [name], MD;  Location: [facility];  Service: General;  Laterality: Left;  . ORIF FEMUR FRACTURE Left 09/03/2019   Procedure: OPEN REDUCTION INTERNAL FIXATION (ORIF) LEFT HIP FEMUR FRACTURE;  Surgeon: Paralee Cancel, MD;  Location: [facility];  Service: Orthopedics;  Laterality: Left;  . PORTACATH PLACEMENT Right 08/08/2018   Procedure: INSERTION PORT-A-CATH;  Surgeon: [name], MD;  Location: [facility];  Service: General;  Laterality: Right;  . TONSILLECTOMY    . TONSILLECTOMY AND ADENOIDECTOMY      SOCIAL HISTORY:  Social History   Socioeconomic History  . Marital status: Widowed    Spouse name: Not on file  . Number of children: Not on file  . Years of education: Not on file  . Highest education level: Not on file  Occupational History  . Not on file  Tobacco Use  . Smoking status: Never Smoker  . Smokeless tobacco: Never Used  Vaping Use  . Vaping Use: Never used  Substance and Sexual Activity  . Alcohol use: No  . Drug use: No  . Sexual activity: Not Currently    Partners: Male  Other Topics Concern  . Not on file  Social History Narrative  . Not on file   Social Determinants of Health   Financial Resource Strain: Low Risk   . Difficulty of Paying Living Expenses:  Not hard at all  Food Insecurity: No Food Insecurity  . Worried About Charity fundraiser in the Last Year: Never true  . Ran Out of Food in the Last Year: Never true  Transportation Needs: No Transportation Needs  . Lack of Transportation (Medical): No  . Lack of Transportation (Non-Medical): No  Physical Activity: Insufficiently Active  . Days of Exercise per Week: 3 days  . Minutes of Exercise per Session: 20 min  Stress: No Stress Concern Present  . Feeling of Stress : Only a little  Social Connections: Moderately Isolated  . Frequency of Communication with Friends and Family: More than three times a week  . Frequency of Social Gatherings with Friends and Family: More than three times a week  . Attends Religious Services: 1 to 4 times per year  . Active Member of Clubs or Organizations: Not on file  . Attends Archivist Meetings: Never  . Marital Status: Widowed  Intimate Partner Violence: Not At Risk  . Fear of  Current or Ex-Partner: No  . Emotionally Abused: No  . Physically Abused: No  . Sexually Abused: No    FAMILY HISTORY:  Family History  Problem Relation Age of Onset  . Heart failure Mother   . Heart failure Father   . Congestive Heart Failure Brother   . Tuberculosis Paternal Uncle   . Tuberculosis Maternal Grandmother   . Tuberculosis Maternal Grandfather   . Congestive Heart Failure Brother     CURRENT MEDICATIONS:  Current Outpatient Medications  Medication Sig Dispense Refill  . Ado-Trastuzumab Emtansine (KADCYLA IV) Inject into the vein every 21 ( twenty-one) days.    Marland Kitchen amLODipine (NORVASC) 5 MG tablet Take 5 mg by mouth daily.    . ferrous sulfate 325 (65 FE) MG tablet Take 325 mg by mouth daily with breakfast.    . HYDROcodone-acetaminophen (NORCO/VICODIN) 5-325 MG tablet Take 1 tablet by mouth every 6 (six) hours as needed for moderate pain or severe pain. 30 tablet 0  . labetalol (NORMODYNE) 100 MG tablet Take 1 tablet (100 mg total) by  mouth 2 (two) times daily. 60 tablet 12  . megestrol (MEGACE) 400 MG/10ML suspension Take 10 mLs (400 mg total) by mouth 2 (two) times daily. 480 mL 0  . ondansetron (ZOFRAN) 4 MG tablet Take 1 tablet (4 mg total) by mouth every 8 (eight) hours as needed for nausea or vomiting. 30 tablet 3  . oxybutynin (DITROPAN) 5 MG tablet Take 5 mg by mouth daily.     Marland Kitchen PREVIDENT 5000 BOOSTER PLUS 1.1 % PSTE APPLY WITH TOOTHBRUSH AT BEDTIME AS DIRECTED.    Marland Kitchen simvastatin (ZOCOR) 40 MG tablet Take 40 mg by mouth daily.    . temazepam (RESTORIL) 30 MG capsule TAKE 1 CAPSULE BY MOUTH ONCE DAILY AT BEDTIME AS NEEDED FOR SLEEP --  MAY  INCREASE  DOSE  TO  2  CAPSULES  IF  1  CAPSULE  DOES  NOT  HELP 30 capsule 0   No current facility-administered medications for this visit.    ALLERGIES:  Allergies  Allergen Reactions  . Contrast Media [Iodinated Diagnostic Agents] Diarrhea    PHYSICAL EXAM:  Performance status (ECOG): 2 - Symptomatic, <50% confined to bed  There were no vitals filed for this visit. Wt Readings from Last 3 Encounters:  09/28/20 99 lb 9.6 oz (45.2 kg)  09/16/20 101 lb 3.2 oz (45.9 kg)  08/27/20 101 lb 6.4 oz (46 kg)   Physical Exam Vitals reviewed.  Constitutional:      Appearance: Normal appearance.  Cardiovascular:     Rate and Rhythm: Normal rate and regular rhythm.     Heart sounds: Normal heart sounds.  Pulmonary:     Effort: Pulmonary effort is normal.     Breath sounds: Normal breath sounds.  Chest:     Comments: Port-a-Cath in R chest Abdominal:     General: There is no distension.     Palpations: Abdomen is soft. There is no mass.  Neurological:     General: No focal deficit present.     Mental Status: She is alert and oriented to person, place, and time.  Psychiatric:        Mood and Affect: Mood normal.        Behavior: Behavior normal.     LABORATORY DATA:  I have reviewed the labs as listed.  CBC Latest Ref Rng & Units 09/28/2020 09/16/2020 08/27/2020  WBC  4.0 - 10.5 K/uL 4.8 5.9 4.4  Hemoglobin 12.0 -  15.0 g/dL 11.8(L) 11.8(L) 11.8(L)  Hematocrit 36.0 - 46.0 % 36.8 37.5 36.3  Platelets 150 - 400 K/uL 178 199 175   CMP Latest Ref Rng & Units 09/28/2020 09/16/2020 08/27/2020  Glucose 70 - 99 mg/dL 154(H) 129(H) 134(H)  BUN 8 - 23 mg/dL 24(H) 29(H) 28(H)  Creatinine 0.44 - 1.00 mg/dL 1.05(H) 1.24(H) 1.19(H)  Sodium 135 - 145 mmol/L 138 138 138  Potassium 3.5 - 5.1 mmol/L 4.3 4.6 4.0  Chloride 98 - 111 mmol/L 103 104 104  CO2 22 - 32 mmol/L _0 Calcium 8.9 - 10.3 mg/dL 9.1 9.2 9.2  Total Protein 6.5 - 8.1 g/dL 5.8(L) 5.9(L) 6.2(L)  Total Bilirubin 0.3 - 1.2 mg/dL 0.7 0.4 0.7  Alkaline Phos 38 - 126 U/L 48 47 51  AST 15 - 41 U/L _1 ALT 0 - 44 U/L _2 DIAGNOSTIC IMAGING:  I have independently reviewed the scans and discussed with the patient. NM PET Image Restag (PS) Skull Base To Thigh  Result Date: 09/25/2020 CLINICAL DATA:  Subsequent treatment strategy for restaging of left-sided breast cancer. Left mastectomy in 2019. EXAM: NUCLEAR MEDICINE PET SKULL BASE TO THIGH TECHNIQUE: 5.0 mCi F-18 FDG was injected intravenously. Full-ring PET imaging was performed from the skull base to thigh after the radiotracer. CT data was obtained and used for attenuation correction and anatomic localization. Fasting blood glucose: 125 mg/dl COMPARISON:  06/22/2020 FINDINGS: Mediastinal blood pool activity: SUV max 2.3 Liver activity: SUV max NA NECK: Right supraclavicular/low jugular hypermetabolic partially calcified node of 8 mm and a S.U.V. max of 5.8 on 44/4. Compare 9 mm and a S.U.V. max of 5.4 on the prior exam. Incidental CT findings: Carotid atherosclerosis. Incompletely imaged VP shunt catheter. CHEST: Subcarinal node measures 1.0 cm and a S.U.V. max of 8.8 on 68/4 versus 7 mm and a S.U.V. max of 7.2 on the prior exam. Deep left axillary partially calcified node measures 9 mm and a S.U.V. max of 5.2 on 57/4 versus 11 mm and a S.U.V. max  of 5.1 on the prior exam. Dominant central left lower lobe lung mass measures 5.9 cm and a S.U.V. max of 12.1 on 75/4 versus 5.7 cm and a S.U.V. max of 10.9 on the prior exam (when remeasured). Right lower lobe lung mass measures 3.1 cm and a S.U.V. max of 14.0 on 44/8 versus 2.8 cm and a S.U.V. max of 11.3 on the prior exam. Incidental CT findings: Tiny bilateral pleural effusions, significantly decreased. Aortic and multivessel coronary artery atherosclerosis. Trace pericardial fluid is likely physiologic. Left axillary node dissection and mastectomy. Index superior segment left lower lobe 1.0 cm nodule on 26/8, similar to on the prior exam (when remeasured). Posterior right upper lobe 2.3 cm nodule on 37/8 measures 2.2 cm on the prior. Moderate hiatal hernia. ABDOMEN/PELVIS: No abdominopelvic parenchymal or nodal hypermetabolism. Incidental CT findings: Normal adrenal glands. Lower pole right and high left hepatic lobe cysts. VP shunt catheter terminates in the high right pelvis anteriorly. Abdominal aortic atherosclerosis. Colonic stool burden suggests constipation. Pelvic floor laxity. SKELETON: No abnormal marrow activity. Incidental CT findings: Osteopenia.  Left proximal femur fixation. IMPRESSION: 1. Thoracic (with isolated low right jugular) nodal and pulmonary hypermetabolic metastasis. Mildly progressive, as evidenced by enlargement and increase in hypermetabolism. 2. No new sites of disease.  No subdiaphragmatic metastasis. 3. Decreased tiny bilateral pleural effusions. 4. Hiatal hernia. Electronically Signed   By: Abigail Miyamoto M.D.   On:  09/25/2020 15:12     ASSESSMENT:  1. Metastatic HER-2 positive left breast cancer: -Enhertu started on 10/22/2019. -CT CAP on 01/10/2020 showed response to therapy with decreasing lung nodules as well as supraclavicular and mediastinal adenopathy with no new evidence of metastatic disease. -CA 15-3 of 6.4 on 02/27/2020. -CT CAP on 04/08/2020 shows more or less  stable lung and liver lesions. Left infrahilar mass measures 5.6 x 5.3 cm, previously 5.4 x 5.4 cm. No new areas were seen. Small dependent bilateral pleural effusions are new. -We will do nextCT scan WITHOUT PO CONTRAST. -PET scan on 06/22/2020 reviewed by me with bilateral scattered hypermetabolic pulmonary metastatic lesions slightly stable to improved from CT scan from 04/08/2020. No extrathoracic lesions. Small bilateral pleural effusions. -PET CT scan on 09/25/2020 showed equivocal response with some lesions decreasing in size and some lesions increasing in size up to 3 mm.  2. High risk drug monitoring: -Echo on 11/12/2019, EF 60-65%. -Echo on 02/24/2020 with EF 60-65%. -2D echo on 06/08/2020 with EF 65 to 70%.   PLAN:  1. Metastatic HER-2 positive left breast cancer: -She has tolerated her last cycle of Enhertu very well. -She has lost her hair.  However her pain has improved in the left axillary region in the last 7 to 10 days. -Reviewed her labs today.  LFTs are normal with normal bilirubin.  Platelet count and white cells are normal. -She will proceed with Enhertu at the same dose of 3.2 mg/kg. -RTC 3 weeks for follow-up.  2. High risk drug monitoring: -Last echo on 06/08/2020 was normal.  3.Leg swellings: -This has improved.  Albumin is 3.4.  4. Difficulty sleeping: -Continue temazepam which is helping.  5. Weight loss: -She did not start Megace as her insurance would not cover it. -However her appetite improved.  She is also drinking 1 can of Ensure per day. -She has gained about 3 pounds since last visit.  6. Left axillary pain: -She has reported no pain in the last 7 to 10 days. -She has hydrocodone 5/325 1/2 tablet as needed.   Orders placed this encounter:  No orders of the defined types were placed in this encounter.    Derek Jack, MD [place] [phone]   I, Milinda Antis, am acting as a scribe for Dr.  Sanda Linger.  I, Derek Jack MD, have reviewed the above documentation for accuracy and completeness, and I agree with the above.

## 2020-10-19 ENCOUNTER — Inpatient Hospital Stay (HOSPITAL_COMMUNITY): Payer: Medicare Other

## 2020-10-19 ENCOUNTER — Inpatient Hospital Stay (HOSPITAL_COMMUNITY): Payer: Medicare Other | Attending: Hematology

## 2020-10-19 ENCOUNTER — Other Ambulatory Visit: Payer: Self-pay

## 2020-10-19 ENCOUNTER — Inpatient Hospital Stay (HOSPITAL_BASED_OUTPATIENT_CLINIC_OR_DEPARTMENT_OTHER): Payer: Medicare Other | Admitting: Hematology

## 2020-10-19 VITALS — BP 125/50 | HR 54 | Temp 97.1°F | Resp 17

## 2020-10-19 DIAGNOSIS — M79622 Pain in left upper arm: Secondary | ICD-10-CM | POA: Insufficient documentation

## 2020-10-19 DIAGNOSIS — C50912 Malignant neoplasm of unspecified site of left female breast: Secondary | ICD-10-CM

## 2020-10-19 DIAGNOSIS — R634 Abnormal weight loss: Secondary | ICD-10-CM | POA: Diagnosis not present

## 2020-10-19 DIAGNOSIS — K449 Diaphragmatic hernia without obstruction or gangrene: Secondary | ICD-10-CM | POA: Insufficient documentation

## 2020-10-19 DIAGNOSIS — Z836 Family history of other diseases of the respiratory system: Secondary | ICD-10-CM | POA: Insufficient documentation

## 2020-10-19 DIAGNOSIS — Z5112 Encounter for antineoplastic immunotherapy: Secondary | ICD-10-CM | POA: Insufficient documentation

## 2020-10-19 DIAGNOSIS — Z9049 Acquired absence of other specified parts of digestive tract: Secondary | ICD-10-CM | POA: Insufficient documentation

## 2020-10-19 DIAGNOSIS — M8580 Other specified disorders of bone density and structure, unspecified site: Secondary | ICD-10-CM | POA: Diagnosis not present

## 2020-10-19 DIAGNOSIS — K7689 Other specified diseases of liver: Secondary | ICD-10-CM | POA: Diagnosis not present

## 2020-10-19 DIAGNOSIS — Z9012 Acquired absence of left breast and nipple: Secondary | ICD-10-CM | POA: Diagnosis not present

## 2020-10-19 DIAGNOSIS — Z8249 Family history of ischemic heart disease and other diseases of the circulatory system: Secondary | ICD-10-CM | POA: Insufficient documentation

## 2020-10-19 DIAGNOSIS — Z79899 Other long term (current) drug therapy: Secondary | ICD-10-CM | POA: Diagnosis not present

## 2020-10-19 DIAGNOSIS — Z87442 Personal history of urinary calculi: Secondary | ICD-10-CM | POA: Diagnosis not present

## 2020-10-19 DIAGNOSIS — I6529 Occlusion and stenosis of unspecified carotid artery: Secondary | ICD-10-CM | POA: Insufficient documentation

## 2020-10-19 DIAGNOSIS — C50512 Malignant neoplasm of lower-outer quadrant of left female breast: Secondary | ICD-10-CM | POA: Insufficient documentation

## 2020-10-19 DIAGNOSIS — R5383 Other fatigue: Secondary | ICD-10-CM | POA: Insufficient documentation

## 2020-10-19 DIAGNOSIS — G479 Sleep disorder, unspecified: Secondary | ICD-10-CM | POA: Diagnosis not present

## 2020-10-19 DIAGNOSIS — J90 Pleural effusion, not elsewhere classified: Secondary | ICD-10-CM | POA: Insufficient documentation

## 2020-10-19 DIAGNOSIS — I700 Atherosclerosis of aorta: Secondary | ICD-10-CM | POA: Diagnosis not present

## 2020-10-19 DIAGNOSIS — I2510 Atherosclerotic heart disease of native coronary artery without angina pectoris: Secondary | ICD-10-CM | POA: Diagnosis not present

## 2020-10-19 DIAGNOSIS — M7989 Other specified soft tissue disorders: Secondary | ICD-10-CM | POA: Insufficient documentation

## 2020-10-19 DIAGNOSIS — Z982 Presence of cerebrospinal fluid drainage device: Secondary | ICD-10-CM | POA: Insufficient documentation

## 2020-10-19 LAB — COMPREHENSIVE METABOLIC PANEL
ALT: 22 U/L (ref 0–44)
AST: 21 U/L (ref 15–41)
Albumin: 3.4 g/dL — ABNORMAL LOW (ref 3.5–5.0)
Alkaline Phosphatase: 41 U/L (ref 38–126)
Anion gap: 8 (ref 5–15)
BUN: 30 mg/dL — ABNORMAL HIGH (ref 8–23)
CO2: 19 mmol/L — ABNORMAL LOW (ref 22–32)
Calcium: 9.1 mg/dL (ref 8.9–10.3)
Chloride: 113 mmol/L — ABNORMAL HIGH (ref 98–111)
Creatinine, Ser: 1.1 mg/dL — ABNORMAL HIGH (ref 0.44–1.00)
GFR, Estimated: 47 mL/min — ABNORMAL LOW (ref >60)
Glucose, Bld: 126 mg/dL — ABNORMAL HIGH (ref 70–99)
Potassium: 4.1 mmol/L (ref 3.5–5.1)
Sodium: 140 mmol/L (ref 135–145)
Total Bilirubin: 0.6 mg/dL (ref 0.3–1.2)
Total Protein: 5.9 g/dL — ABNORMAL LOW (ref 6.5–8.1)

## 2020-10-19 LAB — CBC WITH DIFFERENTIAL/PLATELET
Abs Immature Granulocytes: 0.02 10*3/uL (ref 0.00–0.07)
Basophils Absolute: 0 10*3/uL (ref 0.0–0.1)
Basophils Relative: 1 %
Eosinophils Absolute: 0.1 10*3/uL (ref 0.0–0.5)
Eosinophils Relative: 2 %
HCT: 34.3 % — ABNORMAL LOW (ref 36.0–46.0)
Hemoglobin: 11.1 g/dL — ABNORMAL LOW (ref 12.0–15.0)
Immature Granulocytes: 0 %
Lymphocytes Relative: 15 %
Lymphs Abs: 0.7 10*3/uL (ref 0.7–4.0)
MCH: 33 pg (ref 26.0–34.0)
MCHC: 32.4 g/dL (ref 30.0–36.0)
MCV: 102.1 fL — ABNORMAL HIGH (ref 80.0–100.0)
Monocytes Absolute: 0.4 10*3/uL (ref 0.1–1.0)
Monocytes Relative: 8 %
Neutro Abs: 3.5 10*3/uL (ref 1.7–7.7)
Neutrophils Relative %: 74 %
Platelets: 178 10*3/uL (ref 150–400)
RBC: 3.36 MIL/uL — ABNORMAL LOW (ref 3.87–5.11)
RDW: 14.5 % (ref 11.5–15.5)
WBC: 4.7 10*3/uL (ref 4.0–10.5)
nRBC: 0 % (ref 0.0–0.2)

## 2020-10-19 LAB — MAGNESIUM: Magnesium: 2.1 mg/dL (ref 1.7–2.4)

## 2020-10-19 MED ORDER — SODIUM CHLORIDE 0.9% FLUSH
10.0000 mL | INTRAVENOUS | Status: DC | PRN
  Administered 2020-10-19: 10 mL

## 2020-10-19 MED ORDER — DEXTROSE 5 % IV SOLN: Freq: Once | INTRAVENOUS | Status: AC

## 2020-10-19 MED ORDER — DIPHENHYDRAMINE HCL 25 MG PO CAPS
25.0000 mg | ORAL_CAPSULE | Freq: Once | ORAL | Status: AC
Start: 2020-10-19 — End: 2020-10-19
  Administered 2020-10-19: 25 mg via ORAL

## 2020-10-19 MED ORDER — FAM-TRASTUZUMAB DERUXTECAN-NXKI CHEMO 100 MG IV SOLR
3.2000 mg/kg | Freq: Once | INTRAVENOUS | Status: AC
  Administered 2020-10-19: 144 mg via INTRAVENOUS
  Filled 2020-10-19: qty 7.2

## 2020-10-19 MED ORDER — ACETAMINOPHEN 325 MG PO TABS
650.0000 mg | ORAL_TABLET | Freq: Once | ORAL | Status: AC
  Administered 2020-10-19: 650 mg via ORAL

## 2020-10-19 MED ORDER — PALONOSETRON HCL INJECTION 0.25 MG/5ML
0.2500 mg | Freq: Once | INTRAVENOUS | Status: AC
  Administered 2020-10-19: 0.25 mg via INTRAVENOUS

## 2020-10-19 MED ORDER — DIPHENHYDRAMINE HCL 25 MG PO CAPS
ORAL_CAPSULE | ORAL | Status: AC
  Filled 2020-10-19: qty 1

## 2020-10-19 MED ORDER — PALONOSETRON HCL INJECTION 0.25 MG/5ML
INTRAVENOUS | Status: AC
  Filled 2020-10-19: qty 5

## 2020-10-19 MED ORDER — HEPARIN SOD (PORK) LOCK FLUSH 100 UNIT/ML IV SOLN
500.0000 [IU] | Freq: Once | INTRAVENOUS | Status: AC | PRN
  Administered 2020-10-19: 500 [IU]

## 2020-10-19 MED ORDER — SODIUM CHLORIDE 0.9 % IV SOLN
10.0000 mg | Freq: Once | INTRAVENOUS | Status: AC
  Administered 2020-10-19: 10 mg via INTRAVENOUS
  Filled 2020-10-19: qty 10

## 2020-10-19 MED ORDER — ACETAMINOPHEN 325 MG PO TABS
ORAL_TABLET | ORAL | Status: AC
  Filled 2020-10-19: qty 2

## 2020-10-19 NOTE — Progress Notes (Signed)
Pt here for D1C16 for enhertu. Labs WNL for treatment today. New consent signed.  Pt complaining of more fatigue than normal. Okay for treatment today.  Tolerated treatment well today without incidence.  Discharged via wheelchair in stable condition.  Vital signs stable prior to discharge.

## 2020-10-19 NOTE — Progress Notes (Signed)
Patient was assessed by Dr. Katragadda and labs have been reviewed.  Patient is okay to proceed with treatment today. Primary RN and pharmacy aware.   

## 2020-10-19 NOTE — Patient Instructions (Signed)
[facility] at [facility] Discharge Instructions  You were seen and examined today by Dr. Katragadda. Please follow up as scheduled.    Thank you for choosing [facility] at [facility] to provide your oncology and hematology care.  To afford each patient quality time with our provider, please arrive at least 15 minutes before your scheduled appointment time.   If you have a lab appointment with the Cancer Center please come in thru the Main Entrance and check in at the main information desk.  You need to re-schedule your appointment should you arrive 10 or more minutes late.  We strive to give you quality time with our providers, and arriving late affects you and other patients whose appointments are after yours.  Also, if you no show three or more times for appointments you may be dismissed from the clinic at the providers discretion.     Again, thank you for choosing [facility].  Our hope is that these requests will decrease the amount of time that you wait before being seen by our physicians.       _____________________________________________________________  Should you have questions after your visit to [facility], please contact our office at [phone] and follow the prompts.  Our office hours are 8:00 a.m. and 4:30 p.m. Monday - Friday.  Please note that voicemails left after 4:00 p.m. may not be returned until the following business day.  We are closed weekends and major holidays.  You do have access to a nurse 24-7, just call the main number to the clinic [phone] and do not press any options, hold on the line and a nurse will answer the phone.    For prescription refill requests, have your pharmacy contact our office and allow 72 hours.    Due to Covid, you will need to wear a mask upon entering the hospital. If you do not have a mask, a mask will be given to you at the Main Entrance upon arrival. For  doctor visits, patients may have 1 support person age 18 or older with them. For treatment visits, patients can not have anyone with them due to social distancing guidelines and our immunocompromised population.      

## 2020-10-19 NOTE — Patient Instructions (Signed)
[facility] Discharge Instructions for Patients Receiving Chemotherapy  Today you received the following chemotherapy agents   To help prevent nausea and vomiting after your treatment, we encourage you to take your nausea medication   If you develop nausea and vomiting that is not controlled by your nausea medication, call the clinic.   BELOW ARE SYMPTOMS THAT SHOULD BE REPORTED IMMEDIATELY:  *FEVER GREATER THAN 100.5 F  *CHILLS WITH OR WITHOUT FEVER  NAUSEA AND VOMITING THAT IS NOT CONTROLLED WITH YOUR NAUSEA MEDICATION  *UNUSUAL SHORTNESS OF BREATH  *UNUSUAL BRUISING OR BLEEDING  TENDERNESS IN MOUTH AND THROAT WITH OR WITHOUT PRESENCE OF ULCERS  *URINARY PROBLEMS  *BOWEL PROBLEMS  UNUSUAL RASH Items with * indicate a potential emergency and should be followed up as soon as possible.  Feel free to call the clinic should you have any questions or concerns. The clinic phone number is [phone].  Please show the CHEMO ALERT CARD at check-in to the Emergency Department and triage nurse.   

## 2020-10-20 LAB — CANCER ANTIGEN 15-3: CA 15-3: 7 U/mL (ref 0.0–25.0)

## 2020-11-02 ENCOUNTER — Other Ambulatory Visit (HOSPITAL_COMMUNITY): Payer: Self-pay | Admitting: Hematology

## 2020-11-05 DIAGNOSIS — I10 Essential (primary) hypertension: Secondary | ICD-10-CM | POA: Diagnosis not present

## 2020-11-05 DIAGNOSIS — C50919 Malignant neoplasm of unspecified site of unspecified female breast: Secondary | ICD-10-CM | POA: Diagnosis not present

## 2020-11-11 ENCOUNTER — Other Ambulatory Visit (HOSPITAL_COMMUNITY): Payer: Self-pay

## 2020-11-11 ENCOUNTER — Inpatient Hospital Stay (HOSPITAL_COMMUNITY): Payer: Medicare Other | Attending: Hematology

## 2020-11-11 ENCOUNTER — Other Ambulatory Visit: Payer: Self-pay

## 2020-11-11 ENCOUNTER — Inpatient Hospital Stay (HOSPITAL_COMMUNITY): Payer: Medicare Other

## 2020-11-11 ENCOUNTER — Inpatient Hospital Stay (HOSPITAL_BASED_OUTPATIENT_CLINIC_OR_DEPARTMENT_OTHER): Payer: Medicare Other | Admitting: Hematology

## 2020-11-11 VITALS — BP 108/47 | HR 56 | Temp 96.8°F | Resp 16 | Wt 95.8 lb

## 2020-11-11 DIAGNOSIS — R110 Nausea: Secondary | ICD-10-CM | POA: Insufficient documentation

## 2020-11-11 DIAGNOSIS — C50912 Malignant neoplasm of unspecified site of left female breast: Secondary | ICD-10-CM

## 2020-11-11 DIAGNOSIS — R634 Abnormal weight loss: Secondary | ICD-10-CM | POA: Insufficient documentation

## 2020-11-11 DIAGNOSIS — G479 Sleep disorder, unspecified: Secondary | ICD-10-CM | POA: Insufficient documentation

## 2020-11-11 DIAGNOSIS — R1012 Left upper quadrant pain: Secondary | ICD-10-CM | POA: Insufficient documentation

## 2020-11-11 DIAGNOSIS — M79622 Pain in left upper arm: Secondary | ICD-10-CM | POA: Diagnosis not present

## 2020-11-11 DIAGNOSIS — C7801 Secondary malignant neoplasm of right lung: Secondary | ICD-10-CM | POA: Diagnosis not present

## 2020-11-11 DIAGNOSIS — R7989 Other specified abnormal findings of blood chemistry: Secondary | ICD-10-CM | POA: Diagnosis not present

## 2020-11-11 DIAGNOSIS — C50512 Malignant neoplasm of lower-outer quadrant of left female breast: Secondary | ICD-10-CM | POA: Diagnosis present

## 2020-11-11 DIAGNOSIS — Z79899 Other long term (current) drug therapy: Secondary | ICD-10-CM | POA: Diagnosis not present

## 2020-11-11 DIAGNOSIS — J90 Pleural effusion, not elsewhere classified: Secondary | ICD-10-CM | POA: Insufficient documentation

## 2020-11-11 DIAGNOSIS — Z9049 Acquired absence of other specified parts of digestive tract: Secondary | ICD-10-CM | POA: Diagnosis not present

## 2020-11-11 DIAGNOSIS — Z836 Family history of other diseases of the respiratory system: Secondary | ICD-10-CM | POA: Insufficient documentation

## 2020-11-11 DIAGNOSIS — Z8249 Family history of ischemic heart disease and other diseases of the circulatory system: Secondary | ICD-10-CM | POA: Insufficient documentation

## 2020-11-11 DIAGNOSIS — Z5112 Encounter for antineoplastic immunotherapy: Secondary | ICD-10-CM | POA: Insufficient documentation

## 2020-11-11 DIAGNOSIS — C7802 Secondary malignant neoplasm of left lung: Secondary | ICD-10-CM | POA: Diagnosis not present

## 2020-11-11 LAB — COMPREHENSIVE METABOLIC PANEL
ALT: 18 U/L (ref 0–44)
AST: 29 U/L (ref 15–41)
Albumin: 3.2 g/dL — ABNORMAL LOW (ref 3.5–5.0)
Alkaline Phosphatase: 46 U/L (ref 38–126)
Anion gap: 9 (ref 5–15)
BUN: 21 mg/dL (ref 8–23)
CO2: 24 mmol/L (ref 22–32)
Calcium: 8.8 mg/dL — ABNORMAL LOW (ref 8.9–10.3)
Chloride: 107 mmol/L (ref 98–111)
Creatinine, Ser: 1.1 mg/dL — ABNORMAL HIGH (ref 0.44–1.00)
GFR, Estimated: 47 mL/min — ABNORMAL LOW (ref >60)
Glucose, Bld: 172 mg/dL — ABNORMAL HIGH (ref 70–99)
Potassium: 4.4 mmol/L (ref 3.5–5.1)
Sodium: 140 mmol/L (ref 135–145)
Total Bilirubin: 0.6 mg/dL (ref 0.3–1.2)
Total Protein: 5.7 g/dL — ABNORMAL LOW (ref 6.5–8.1)

## 2020-11-11 LAB — CBC WITH DIFFERENTIAL/PLATELET
Abs Immature Granulocytes: 0.01 10*3/uL (ref 0.00–0.07)
Basophils Absolute: 0 10*3/uL (ref 0.0–0.1)
Basophils Relative: 1 %
Eosinophils Absolute: 0.1 10*3/uL (ref 0.0–0.5)
Eosinophils Relative: 3 %
HCT: 35.6 % — ABNORMAL LOW (ref 36.0–46.0)
Hemoglobin: 11.2 g/dL — ABNORMAL LOW (ref 12.0–15.0)
Immature Granulocytes: 0 %
Lymphocytes Relative: 20 %
Lymphs Abs: 0.8 10*3/uL (ref 0.7–4.0)
MCH: 32.7 pg (ref 26.0–34.0)
MCHC: 31.5 g/dL (ref 30.0–36.0)
MCV: 104.1 fL — ABNORMAL HIGH (ref 80.0–100.0)
Monocytes Absolute: 0.4 10*3/uL (ref 0.1–1.0)
Monocytes Relative: 9 %
Neutro Abs: 2.7 10*3/uL (ref 1.7–7.7)
Neutrophils Relative %: 67 %
Platelets: 179 10*3/uL (ref 150–400)
RBC: 3.42 MIL/uL — ABNORMAL LOW (ref 3.87–5.11)
RDW: 14.4 % (ref 11.5–15.5)
WBC: 4 10*3/uL (ref 4.0–10.5)
nRBC: 0 % (ref 0.0–0.2)

## 2020-11-11 LAB — MAGNESIUM: Magnesium: 2.1 mg/dL (ref 1.7–2.4)

## 2020-11-11 MED ORDER — SODIUM CHLORIDE 0.9% FLUSH
10.0000 mL | Freq: Once | INTRAVENOUS | Status: AC
  Administered 2020-11-11: 10 mL via INTRAVENOUS

## 2020-11-11 MED ORDER — ONDANSETRON HCL 4 MG/2ML IJ SOLN
8.0000 mg | Freq: Once | INTRAMUSCULAR | Status: AC
  Administered 2020-11-11: 8 mg via INTRAVENOUS
  Filled 2020-11-11: qty 4

## 2020-11-11 MED ORDER — ONDANSETRON HCL 4 MG/2ML IJ SOLN
INTRAMUSCULAR | Status: AC
  Filled 2020-11-11: qty 2

## 2020-11-11 MED ORDER — TEMAZEPAM 30 MG PO CAPS: ORAL_CAPSULE | ORAL | 5 refills | Status: DC

## 2020-11-11 MED ORDER — ONDANSETRON HCL 8 MG PO TABS: 4.0000 mg | ORAL_TABLET | Freq: Three times a day (TID) | ORAL | 3 refills | Status: DC | PRN

## 2020-11-11 MED ORDER — SODIUM CHLORIDE 0.9% IV SOLUTION: Freq: Once | INTRAVENOUS | Status: AC

## 2020-11-11 MED ORDER — HEPARIN SOD (PORK) LOCK FLUSH 100 UNIT/ML IV SOLN
500.0000 [IU] | Freq: Once | INTRAVENOUS | Status: AC
  Administered 2020-11-11: 500 [IU] via INTRAVENOUS

## 2020-11-11 NOTE — Progress Notes (Signed)
Carol Duarte, [place] [address]   CLINIC:  Medical Oncology/Hematology  PCP:  [name], MD [place] / [place] [address] [phone]   REASON FOR VISIT:  Follow-up for IDC of left breast  PRIOR THERAPY:  1. Lumpectomy on 04/24/2017. 2. Left modified radical mastectomy on 05/28/2018. 3. Trastuzumab x 10 cycles, pertuzumab x 6 cycles, emtansine x 8 cycles.  NGS Results: Not done  CURRENT THERAPY: Enhertu & Aloxi every 3 weeks  BRIEF ONCOLOGIC HISTORY:  Oncology History  Invasive ductal carcinoma of left breast ([place])  03/30/2017 Initial Diagnosis   Invasive ductal carcinoma of left breast ([place])   08/09/2018 - 02/28/2019 Chemotherapy   The patient had lapatinib (TYKERB) 250 MG tablet, 1,000 mg, Oral, Daily, 1 of 1 cycle, Start date: --, End date: -- trastuzumab (HERCEPTIN) 500 mg in sodium chloride 0.9 % 250 mL chemo infusion, 525 mg, Intravenous,  Once, 10 of 10 cycles Administration: 500 mg (08/09/2018), 350 mg (09/06/2018), 350 mg (09/27/2018), 378 mg (10/18/2018), 378 mg (11/15/2018), 378 mg (12/06/2018), 378 mg (12/28/2018), 378 mg (01/18/2019), 378 mg (02/08/2019) pertuzumab (PERJETA) 840 mg in sodium chloride 0.9 % 250 mL chemo infusion, 840 mg (100 % of original dose 840 mg), Intravenous, Once, 6 of 6 cycles Dose modification: 840 mg (original dose 840 mg, Cycle 5), 420 mg (original dose 420 mg, Cycle 6) Administration: 840 mg (11/15/2018), 420 mg (12/06/2018), 420 mg (12/28/2018), 420 mg (01/18/2019), 420 mg (02/08/2019)  for chemotherapy treatment.    09/18/2018 Genetic Testing   Negative genetic testing for the breast and gyn cancer panel.  The Breast/GYN gene panel offered by [facility] includes sequencing and rearrangement analysis for the following 23 genes:  ATM, BRCA1, BRCA2, BRIP1, CDH1, CHEK2, EPCAM, MLH1, MSH2, MSH6, NBN, NF1, PALB2, PMS2, PTEN, RAD51C, RAD51D, STK11, and TP53.   The report date is 09/18/2018.   03/12/2019 -  08/20/2019 Chemotherapy   The patient had ado-trastuzumab emtansine (KADCYLA) 140 mg in sodium chloride 0.9 % 250 mL chemo infusion, 2.4 mg/kg = 140 mg (100 % of original dose 2.4 mg/kg), Intravenous, Once, 8 of 9 cycles Dose modification: 2.4 mg/kg (original dose 2.4 mg/kg, Cycle 1, Reason: Patient Age), 3 mg/kg (original dose 2.4 mg/kg, Cycle 3, Reason: Provider Judgment) Administration: 140 mg (03/12/2019), 180 mg (04/23/2019), 180 mg (06/18/2019), 180 mg (04/02/2019), 180 mg (05/28/2019), 180 mg (07/09/2019), 180 mg (07/30/2019), 180 mg (08/20/2019)  for chemotherapy treatment.    10/22/2019 -  Chemotherapy    Patient is on Treatment Plan: BREAST METASTATIC FAM-TRASTUZUMAB DERUXTECAN-NXKI (ENHERTU) Q21D        CANCER STAGING: Cancer Staging No matching staging information was found for the patient.  INTERVAL HISTORY:  Carol Duarte, a 87 y.o. female, returns for routine follow-up and consideration for next cycle of immunotherapy. Carol Duarte was last seen on 10/19/2020.  Due for cycle #17 of Enhertu and Aloxi today.   Overall, she tells me she has been feeling poorly. She complains of having nausea constantly every day and has dry heaving since her last treatment. She feels so poorly that she has to lie down in bed every day and according to her daughter, she takes Zofran only when she feels nauseous, usually once a day or less. The Zofran works only for a couple hours. She complains of having pain in her LUQ and left flank. She takes ibuprofen for the pain and Norco whole tablet when the pain becomes unbearable but it takes 30  minutes to start working; she took only 4-5 tablets of Norco over the past 3 weeks. She eats multiple times per day, though small portions, even though her appetite is greatly decreased. She is trying to drink 1 can of Ensure but she does not drink enough water, according to the daughter. She has lost 7 lbs in the past 3 weeks. She is taking Restoril at bedtime.  She  will forego treatment since she has lost weight and been having nausea and dry heaving.    REVIEW OF SYSTEMS:  Review of Systems  Constitutional: Positive for appetite change (25%), fatigue (depleted) and unexpected weight change (lost 7 lbs in 3 weeks).  Gastrointestinal: Positive for abdominal pain (L flank pain), nausea (constantly since last Tx) and vomiting (dry heaving).  Psychiatric/Behavioral: Positive for sleep disturbance (on Restoril).  All other systems reviewed and are negative.   PAST MEDICAL/SURGICAL HISTORY:  Past Medical History:  Diagnosis Date  . Cancer ([place])    left breast  . History of kidney stones   . Hypercholesteremia   . Hypertension    Past Surgical History:  Procedure Laterality Date  . APPENDECTOMY    . brain cyst removed  2011  . BREAST BIOPSY Left 03/28/2018   Procedure: BREAST BIOPSY;  Surgeon: [name], MD;  Location: [facility];  Service: General;  Laterality: Left;  . CATARACT EXTRACTION W/PHACO Left 11/09/2015   Procedure: CATARACT EXTRACTION PHACO AND INTRAOCULAR LENS PLACEMENT LEFT EYE cde=8.97;  Surgeon: [name], MD;  Location: [facility];  Service: Ophthalmology;  Laterality: Left;  . CATARACT EXTRACTION W/PHACO Right 02/04/2019   Procedure: CATARACT EXTRACTION PHACO AND INTRAOCULAR LENS PLACEMENT (IOC);  Surgeon: [name], MD;  Location: [facility];  Service: Ophthalmology;  Laterality: Right;  CDE: 15.19  . EYE SURGERY     KPE left  . MASTECTOMY MODIFIED RADICAL Left 05/28/2018   Procedure: LEFT MODIFIED RADICAL MASTECTOMY;  Surgeon: [name], MD;  Location: [facility];  Service: General;  Laterality: Left;  Marland Kitchen MASTECTOMY, PARTIAL Left 04/24/2017   Procedure: MASTECTOMY PARTIAL;  Surgeon: [name], MD;  Location: [facility];  Service: General;  Laterality: Left;  . ORIF FEMUR FRACTURE Left 09/03/2019   Procedure: OPEN REDUCTION INTERNAL FIXATION (ORIF) LEFT HIP FEMUR FRACTURE;  Surgeon: Paralee Cancel, MD;  Location: [facility];  Service:  Orthopedics;  Laterality: Left;  . PORTACATH PLACEMENT Right 08/08/2018   Procedure: INSERTION PORT-A-CATH;  Surgeon: [name], MD;  Location: [facility];  Service: General;  Laterality: Right;  . TONSILLECTOMY    . TONSILLECTOMY AND ADENOIDECTOMY      SOCIAL HISTORY:  Social History   Socioeconomic History  . Marital status: Widowed    Spouse name: Not on file  . Number of children: Not on file  . Years of education: Not on file  . Highest education level: Not on file  Occupational History  . Not on file  Tobacco Use  . Smoking status: Never Smoker  . Smokeless tobacco: Never Used  Vaping Use  . Vaping Use: Never used  Substance and Sexual Activity  . Alcohol use: No  . Drug use: No  . Sexual activity: Not Currently    Partners: Male  Other Topics Concern  . Not on file  Social History Narrative  . Not on file   Social Determinants of Health   Financial Resource Strain: Low Risk   . Difficulty of Paying Living Expenses: Not hard at all  Food Insecurity: No Food Insecurity  . Worried  About Running Out of Food in the Last Year: Never true  . Ran Out of Food in the Last Year: Never true  Transportation Needs: No Transportation Needs  . Lack of Transportation (Medical): No  . Lack of Transportation (Non-Medical): No  Physical Activity: Insufficiently Active  . Days of Exercise per Week: 3 days  . Minutes of Exercise per Session: 20 min  Stress: No Stress Concern Present  . Feeling of Stress : Only a little  Social Connections: Moderately Isolated  . Frequency of Communication with Friends and Family: More than three times a week  . Frequency of Social Gatherings with Friends and Family: More than three times a week  . Attends Religious Services: 1 to 4 times per year  . Active Member of Clubs or Organizations: Not on file  . Attends Archivist Meetings: Never  . Marital Status: Widowed  Intimate Partner Violence: Not At Risk  . Fear of Current or  Ex-Partner: No  . Emotionally Abused: No  . Physically Abused: No  . Sexually Abused: No    FAMILY HISTORY:  Family History  Problem Relation Age of Onset  . Heart failure Mother   . Heart failure Father   . Congestive Heart Failure Brother   . Tuberculosis Paternal Uncle   . Tuberculosis Maternal Grandmother   . Tuberculosis Maternal Grandfather   . Congestive Heart Failure Brother     CURRENT MEDICATIONS:  Current Outpatient Medications  Medication Sig Dispense Refill  . Ado-Trastuzumab Emtansine (KADCYLA IV) Inject into the vein every 21 ( twenty-one) days.    Marland Kitchen amLODipine (NORVASC) 5 MG tablet Take 5 mg by mouth daily.    . ferrous sulfate 325 (65 FE) MG tablet Take 325 mg by mouth daily with breakfast.    . FLUZONE HIGH-DOSE QUADRIVALENT 0.7 ML SUSY     . HYDROcodone-acetaminophen (NORCO/VICODIN) 5-325 MG tablet Take 1 tablet by mouth every 6 (six) hours as needed for moderate pain or severe pain. 30 tablet 0  . labetalol (NORMODYNE) 100 MG tablet Take 1 tablet (100 mg total) by mouth 2 (two) times daily. 60 tablet 12  . megestrol (MEGACE) 400 MG/10ML suspension Take 10 mLs (400 mg total) by mouth 2 (two) times daily. 480 mL 0  . oxybutynin (DITROPAN) 5 MG tablet Take 5 mg by mouth daily.     Marland Kitchen PREVIDENT 5000 BOOSTER PLUS 1.1 % PSTE APPLY WITH TOOTHBRUSH AT BEDTIME AS DIRECTED.    Marland Kitchen simvastatin (ZOCOR) 40 MG tablet Take 40 mg by mouth daily.    . ondansetron (ZOFRAN) 8 MG tablet Take 0.5 tablets (4 mg total) by mouth every 8 (eight) hours as needed for nausea or vomiting. 60 tablet 3  . temazepam (RESTORIL) 30 MG capsule TAKE 1 CAPSULE BY MOUTH AT BEDTIME AS NEEDED FOR SLEEP 30 capsule 5   No current facility-administered medications for this visit.    ALLERGIES:  Allergies  Allergen Reactions  . Contrast Media [Iodinated Diagnostic Agents] Diarrhea    PHYSICAL EXAM:  Performance status (ECOG): 2 - Symptomatic, <50% confined to bed  Vitals:   11/11/20 0937  BP:  (!) 108/47  Pulse: (!) 56  Resp: 16  Temp: (!) 96.8 F (36 C)  SpO2: 93%   Wt Readings from Last 3 Encounters:  11/11/20 95 lb 12.8 oz (43.5 kg)  10/19/20 102 lb (46.3 kg)  09/28/20 99 lb 9.6 oz (45.2 kg)   Physical Exam Chest:     Comments: Port-a-Cath in R chest  LABORATORY DATA:  I have reviewed the labs as listed.  CBC Latest Ref Rng & Units 11/11/2020 10/19/2020 09/28/2020  WBC 4.0 - 10.5 K/uL 4.0 4.7 4.8  Hemoglobin 12.0 - 15.0 g/dL 11.2(L) 11.1(L) 11.8(L)  Hematocrit 36.0 - 46.0 % 35.6(L) 34.3(L) 36.8  Platelets 150 - 400 K/uL 179 178 178   CMP Latest Ref Rng & Units 11/11/2020 10/19/2020 09/28/2020  Glucose 70 - 99 mg/dL 172(H) 126(H) 154(H)  BUN 8 - 23 mg/dL 21 30(H) 24(H)  Creatinine 0.44 - 1.00 mg/dL 1.10(H) 1.10(H) 1.05(H)  Sodium 135 - 145 mmol/L 140 140 138  Potassium 3.5 - 5.1 mmol/L 4.4 4.1 4.3  Chloride 98 - 111 mmol/L 107 113(H) 103  CO2 22 - 32 mmol/L 24 19(L) 24  Calcium 8.9 - 10.3 mg/dL 8.8(L) 9.1 9.1  Total Protein 6.5 - 8.1 g/dL 5.7(L) 5.9(L) 5.8(L)  Total Bilirubin 0.3 - 1.2 mg/dL 0.6 0.6 0.7  Alkaline Phos 38 - 126 U/L 46 41 48  AST 15 - 41 U/L '29 21 31  ' ALT 0 - 44 U/L '18 22 17    ' DIAGNOSTIC IMAGING:  I have independently reviewed the scans and discussed with the patient. No results found.   ASSESSMENT:  1. Metastatic HER-2 positive left breast cancer: -Enhertu started on 10/22/2019. -CT CAP on 01/10/2020 showed response to therapy with decreasing lung nodules as well as supraclavicular and mediastinal adenopathy with no new evidence of metastatic disease. -CA 15-3 of 6.4 on 02/27/2020. -CT CAP on 04/08/2020 shows more or less stable lung and liver lesions. Left infrahilar mass measures 5.6 x 5.3 cm, previously 5.4 x 5.4 cm. No new areas were seen. Small dependent bilateral pleural effusions are new. -We will do nextCT scan WITHOUT PO CONTRAST. -PET scan on 06/22/2020 reviewed by me with bilateral scattered hypermetabolic pulmonary metastatic  lesions slightly stable to improved from CT scan from 04/08/2020. No extrathoracic lesions. Small bilateral pleural effusions. -PET CT scan on 09/25/2020 showed equivocal response with some lesions decreasing in size and some lesions increasing in size up to 3 mm.  2. High risk drug monitoring: -Echo on 11/12/2019, EF 60-65%. -Echo on 02/24/2020 with EF 60-65%. -2D echo on 06/08/2020 with EF 65 to 70%.   PLAN:  1. Metastatic HER-2 positive left breast cancer: -She has not felt good since last dose of Enhertu. -She felt nauseous throughout the last 3 weeks. -I have phoned and talked to her daughter Jeannene Patella while patient is in the room.  No diarrhea reported. -Reviewed her labs today which showed normal LFTs although albumin is low at 3.2.  CBC was normal. -We will hold her treatment today.  We will give her 500 mL of normal saline along with Zofran.  We will make adjustments to nausea medicine and see her back in 2 weeks.  2. High risk drug monitoring: -Last echo on 06/08/2020 was normal.  3. Nausea: -She is having continuous nausea since last treatment and lost 7 pounds.  She is taking Zofran 4 mg daily as needed which is not helping. -Will increase Zofran to 8 mg every 8 hours as needed.  4. Difficulty sleeping: -Continue temazepam 30 mg at bedtime.  5. Weight loss: -She could not start Megace as her insurance would not cover it. -She lost 7 pounds in the last 3 weeks.  She is drinking about 1 Ensure per day.  She lost weight mostly because she was feeling nauseous.  6. Left axillary pain: -Continue hydrocodone 5/325 1 tablet as needed.  She has taken 4 to 5 pills in the last 3 weeks.  She takes ibuprofen for slight pain.   Orders placed this encounter:  Orders Placed This Encounter  Procedures  . CBC with Differential/Platelet  . Comprehensive metabolic panel  . Magnesium     Derek Jack, MD [place] [phone]   I, Milinda Antis,  am acting as a scribe for Dr. Sanda Linger.  I, Derek Jack MD, have reviewed the above documentation for accuracy and completeness, and I agree with the above.

## 2020-11-11 NOTE — Patient Instructions (Signed)
[place] at [facility] Discharge Instructions  You were seen today by Dr. Delton Coombes. He went over your recent results. You did not receive your treatment today. Your Zofran will be increased to 8 mg tablets to take twice daily as needed for nausea. Dr. Delton Coombes will see you back in 2 weeks for labs and follow up.   Thank you for choosing [place] at [facility] to provide your oncology and hematology care.  To afford each patient quality time with our provider, please arrive at least 15 minutes before your scheduled appointment time.   If you have a lab appointment with the [place] please come in thru the Main Entrance and check in at the main information desk  You need to re-schedule your appointment should you arrive 10 or more minutes late.  We strive to give you quality time with our providers, and arriving late affects you and other patients whose appointments are after yours.  Also, if you no show three or more times for appointments you may be dismissed from the clinic at the providers discretion.     Again, thank you for choosing [facility].  Our hope is that these requests will decrease the amount of time that you wait before being seen by our physicians.       _____________________________________________________________  Should you have questions after your visit to [facility], please contact our office at [phone] between the hours of 8:00 a.m. and 4:30 p.m.  Voicemails left after 4:00 p.m. will not be returned until the following business day.  For prescription refill requests, have your pharmacy contact our office and allow 72 hours.    Cancer Center Support Programs:   > Cancer Support Group  2nd Tuesday of the month 1pm-2pm, Journey Room

## 2020-11-11 NOTE — Progress Notes (Signed)
Patient was assessed by Dr. Delton Coombes and labs have been reviewed. No treatment today. Dr. Delton Coombes is ordering 500 ml normal saline over 1 hour and Zofran 8 mg IV. Primary RN and pharmacy aware.

## 2020-11-11 NOTE — Progress Notes (Signed)
Pt not being treat today per Dr Raliegh Ip.  Will get 500 mL bolus over 1 hr with 8mg  Zofran IVP given.  Tolerated fluids well today without incidence.  Discharged via wheelchair in stable condition.

## 2020-11-11 NOTE — Patient Instructions (Signed)
[place] at [facility]  Discharge Instructions:  You did not get treated today but you did get fluids and nausea medication. Please follow up as scheduled.  _______________________________________________________________  Thank you for choosing [place] at [facility] to provide your oncology and hematology care.  To afford each patient quality time with our providers, please arrive at least 15 minutes before your scheduled appointment.  You need to re-schedule your appointment if you arrive 10 or more minutes late.  We strive to give you quality time with our providers, and arriving late affects you and other patients whose appointments are after yours.  Also, if you no show three or more times for appointments you may be dismissed from the clinic.  Again, thank you for choosing [place] at [place] hope is that these requests will allow you access to exceptional care and in a timely manner. _______________________________________________________________  If you have questions after your visit, please contact our office at [phone] between the hours of 8:30 a.m. and 5:00 p.m. Voicemails left after 4:30 p.m. will not be returned until the following business day. _______________________________________________________________  For prescription refill requests, have your pharmacy contact our office. _______________________________________________________________  Recommendations made by the consultant and any test results will be sent to your referring physician. _______________________________________________________________

## 2020-11-26 ENCOUNTER — Inpatient Hospital Stay (HOSPITAL_BASED_OUTPATIENT_CLINIC_OR_DEPARTMENT_OTHER): Payer: Medicare Other | Admitting: Hematology

## 2020-11-26 ENCOUNTER — Other Ambulatory Visit: Payer: Self-pay

## 2020-11-26 ENCOUNTER — Other Ambulatory Visit (HOSPITAL_COMMUNITY): Payer: Self-pay

## 2020-11-26 ENCOUNTER — Inpatient Hospital Stay (HOSPITAL_COMMUNITY): Payer: Medicare Other

## 2020-11-26 VITALS — BP 137/65 | HR 60 | Temp 96.9°F | Resp 16

## 2020-11-26 VITALS — BP 129/67 | HR 56 | Temp 96.8°F | Resp 18 | Wt 98.6 lb

## 2020-11-26 DIAGNOSIS — Z79899 Other long term (current) drug therapy: Secondary | ICD-10-CM | POA: Diagnosis not present

## 2020-11-26 DIAGNOSIS — Z5112 Encounter for antineoplastic immunotherapy: Secondary | ICD-10-CM | POA: Diagnosis not present

## 2020-11-26 DIAGNOSIS — R0782 Intercostal pain: Secondary | ICD-10-CM

## 2020-11-26 DIAGNOSIS — Z9049 Acquired absence of other specified parts of digestive tract: Secondary | ICD-10-CM | POA: Diagnosis not present

## 2020-11-26 DIAGNOSIS — R7989 Other specified abnormal findings of blood chemistry: Secondary | ICD-10-CM | POA: Diagnosis not present

## 2020-11-26 DIAGNOSIS — C7801 Secondary malignant neoplasm of right lung: Secondary | ICD-10-CM | POA: Diagnosis not present

## 2020-11-26 DIAGNOSIS — J90 Pleural effusion, not elsewhere classified: Secondary | ICD-10-CM | POA: Diagnosis not present

## 2020-11-26 DIAGNOSIS — C7802 Secondary malignant neoplasm of left lung: Secondary | ICD-10-CM | POA: Diagnosis not present

## 2020-11-26 DIAGNOSIS — Z836 Family history of other diseases of the respiratory system: Secondary | ICD-10-CM | POA: Diagnosis not present

## 2020-11-26 DIAGNOSIS — R634 Abnormal weight loss: Secondary | ICD-10-CM | POA: Diagnosis not present

## 2020-11-26 DIAGNOSIS — C50912 Malignant neoplasm of unspecified site of left female breast: Secondary | ICD-10-CM

## 2020-11-26 DIAGNOSIS — M79622 Pain in left upper arm: Secondary | ICD-10-CM | POA: Diagnosis not present

## 2020-11-26 DIAGNOSIS — Z8249 Family history of ischemic heart disease and other diseases of the circulatory system: Secondary | ICD-10-CM | POA: Diagnosis not present

## 2020-11-26 DIAGNOSIS — R1012 Left upper quadrant pain: Secondary | ICD-10-CM | POA: Diagnosis not present

## 2020-11-26 DIAGNOSIS — R110 Nausea: Secondary | ICD-10-CM | POA: Diagnosis not present

## 2020-11-26 DIAGNOSIS — G479 Sleep disorder, unspecified: Secondary | ICD-10-CM | POA: Diagnosis not present

## 2020-11-26 LAB — COMPREHENSIVE METABOLIC PANEL
ALT: 16 U/L (ref 0–44)
AST: 26 U/L (ref 15–41)
Albumin: 3.4 g/dL — ABNORMAL LOW (ref 3.5–5.0)
Alkaline Phosphatase: 44 U/L (ref 38–126)
Anion gap: 9 (ref 5–15)
BUN: 27 mg/dL — ABNORMAL HIGH (ref 8–23)
CO2: 24 mmol/L (ref 22–32)
Calcium: 9 mg/dL (ref 8.9–10.3)
Chloride: 106 mmol/L (ref 98–111)
Creatinine, Ser: 1.11 mg/dL — ABNORMAL HIGH (ref 0.44–1.00)
GFR, Estimated: 46 mL/min — ABNORMAL LOW (ref >60)
Glucose, Bld: 128 mg/dL — ABNORMAL HIGH (ref 70–99)
Potassium: 4.3 mmol/L (ref 3.5–5.1)
Sodium: 139 mmol/L (ref 135–145)
Total Bilirubin: 0.6 mg/dL (ref 0.3–1.2)
Total Protein: 6.1 g/dL — ABNORMAL LOW (ref 6.5–8.1)

## 2020-11-26 LAB — CBC WITH DIFFERENTIAL/PLATELET
Abs Immature Granulocytes: 0.01 10*3/uL (ref 0.00–0.07)
Basophils Absolute: 0 10*3/uL (ref 0.0–0.1)
Basophils Relative: 1 %
Eosinophils Absolute: 0.1 10*3/uL (ref 0.0–0.5)
Eosinophils Relative: 1 %
HCT: 36.4 % (ref 36.0–46.0)
Hemoglobin: 11.4 g/dL — ABNORMAL LOW (ref 12.0–15.0)
Immature Granulocytes: 0 %
Lymphocytes Relative: 17 %
Lymphs Abs: 0.9 10*3/uL (ref 0.7–4.0)
MCH: 32.3 pg (ref 26.0–34.0)
MCHC: 31.3 g/dL (ref 30.0–36.0)
MCV: 103.1 fL — ABNORMAL HIGH (ref 80.0–100.0)
Monocytes Absolute: 0.4 10*3/uL (ref 0.1–1.0)
Monocytes Relative: 7 %
Neutro Abs: 3.7 10*3/uL (ref 1.7–7.7)
Neutrophils Relative %: 74 %
Platelets: 197 10*3/uL (ref 150–400)
RBC: 3.53 MIL/uL — ABNORMAL LOW (ref 3.87–5.11)
RDW: 14.3 % (ref 11.5–15.5)
WBC: 5.1 10*3/uL (ref 4.0–10.5)
nRBC: 0 % (ref 0.0–0.2)

## 2020-11-26 LAB — LACTATE DEHYDROGENASE: LDH: 127 U/L (ref 98–192)

## 2020-11-26 LAB — MAGNESIUM: Magnesium: 2.2 mg/dL (ref 1.7–2.4)

## 2020-11-26 MED ORDER — DIPHENHYDRAMINE HCL 50 MG PO TABS: 50.0000 mg | ORAL_TABLET | Freq: Every evening | ORAL | 0 refills | Status: AC | PRN

## 2020-11-26 MED ORDER — FAM-TRASTUZUMAB DERUXTECAN-NXKI CHEMO 100 MG IV SOLR
3.2000 mg/kg | Freq: Once | INTRAVENOUS | Status: AC
  Administered 2020-11-26: 144 mg via INTRAVENOUS
  Filled 2020-11-26: qty 7.2

## 2020-11-26 MED ORDER — HEPARIN SOD (PORK) LOCK FLUSH 100 UNIT/ML IV SOLN
500.0000 [IU] | Freq: Once | INTRAVENOUS | Status: AC | PRN
  Administered 2020-11-26: 500 [IU]

## 2020-11-26 MED ORDER — PALONOSETRON HCL INJECTION 0.25 MG/5ML
0.2500 mg | Freq: Once | INTRAVENOUS | Status: AC
  Administered 2020-11-26: 0.25 mg via INTRAVENOUS
  Filled 2020-11-26: qty 5

## 2020-11-26 MED ORDER — PREDNISONE 50 MG PO TABS: ORAL_TABLET | ORAL | 0 refills | Status: AC

## 2020-11-26 MED ORDER — DEXTROSE 5 % IV SOLN: Freq: Once | INTRAVENOUS | Status: AC

## 2020-11-26 MED ORDER — HYDROCODONE-ACETAMINOPHEN 5-325 MG PO TABS: 1.0000 | ORAL_TABLET | Freq: Four times a day (QID) | ORAL | 0 refills | Status: AC | PRN

## 2020-11-26 MED ORDER — SODIUM CHLORIDE 0.9% FLUSH
10.0000 mL | INTRAVENOUS | Status: DC | PRN
  Administered 2020-11-26: 10 mL

## 2020-11-26 MED ORDER — DIPHENHYDRAMINE HCL 25 MG PO CAPS
25.0000 mg | ORAL_CAPSULE | Freq: Once | ORAL | Status: AC
Start: 2020-11-26 — End: 2020-11-26
  Administered 2020-11-26: 25 mg via ORAL
  Filled 2020-11-26: qty 1

## 2020-11-26 MED ORDER — SODIUM CHLORIDE 0.9 % IV SOLN
10.0000 mg | Freq: Once | INTRAVENOUS | Status: AC
  Administered 2020-11-26: 10 mg via INTRAVENOUS
  Filled 2020-11-26: qty 10

## 2020-11-26 MED ORDER — ACETAMINOPHEN 325 MG PO TABS
650.0000 mg | ORAL_TABLET | Freq: Once | ORAL | Status: AC
  Administered 2020-11-26: 650 mg via ORAL
  Filled 2020-11-26: qty 2

## 2020-11-26 NOTE — Progress Notes (Signed)
Labs reviewed with MD today at office visit. Will proceed with treatment per MD.   Treatment given per orders. Patient tolerated it well without problems. Vitals stable and discharged home from clinic via wheelchair. Follow up as scheduled.

## 2020-11-26 NOTE — Progress Notes (Signed)
Patient was assessed by Dr. Katragadda and labs have been reviewed.  Patient is okay to proceed with treatment today. Primary RN and pharmacy aware.   

## 2020-11-26 NOTE — Patient Instructions (Signed)
[facility] at [facility] Discharge Instructions  You were seen today by Dr. Katragadda. He went over your recent results. You received your treatment today. You will be scheduled to have a CT scan of your chest done before your next visit. Dr. Katragadda will see you back in 3 weeks for labs and follow up.   Thank you for choosing [facility] at [facility] to provide your oncology and hematology care.  To afford each patient quality time with our provider, please arrive at least 15 minutes before your scheduled appointment time.   If you have a lab appointment with the Cancer Center please come in thru the Main Entrance and check in at the main information desk  You need to re-schedule your appointment should you arrive 10 or more minutes late.  We strive to give you quality time with our providers, and arriving late affects you and other patients whose appointments are after yours.  Also, if you no show three or more times for appointments you may be dismissed from the clinic at the providers discretion.     Again, thank you for choosing [facility].  Our hope is that these requests will decrease the amount of time that you wait before being seen by our physicians.       _____________________________________________________________  Should you have questions after your visit to [facility], please contact our office at [phone] between the hours of 8:00 a.m. and 4:30 p.m.  Voicemails left after 4:00 p.m. will not be returned until the following business day.  For prescription refill requests, have your pharmacy contact our office and allow 72 hours.    Cancer Center Support Programs:   > Cancer Support Group  2nd Tuesday of the month 1pm-2pm, Journey Room    

## 2020-11-26 NOTE — Progress Notes (Signed)
Carol Duarte, [place] [id number]   CLINIC:  Medical Oncology/Hematology  PCP:  [name], MD [place] / [place] [address] [phone]   REASON FOR VISIT:  Follow-up for IDC of left breast  PRIOR THERAPY:  1. Lumpectomy on 04/24/2017. 2. Left modified radical mastectomy on 05/28/2018. 3. Trastuzumab x 10 cycles, pertuzumab x 6 cycles, emtansine x 8 cycles.  NGS Results: Not done  CURRENT THERAPY: Trastuzumab & Aloxi every 3 weeks  BRIEF ONCOLOGIC HISTORY:  Oncology History  Invasive ductal carcinoma of left breast ([place])  03/30/2017 Initial Diagnosis   Invasive ductal carcinoma of left breast ([place])   08/09/2018 - 02/28/2019 Chemotherapy   The patient had lapatinib (TYKERB) 250 MG tablet, 1,000 mg, Oral, Daily, 1 of 1 cycle, Start date: --, End date: -- trastuzumab (HERCEPTIN) 500 mg in sodium chloride 0.9 % 250 mL chemo infusion, 525 mg, Intravenous,  Once, 10 of 10 cycles Administration: 500 mg (08/09/2018), 350 mg (09/06/2018), 350 mg (09/27/2018), 378 mg (10/18/2018), 378 mg (11/15/2018), 378 mg (12/06/2018), 378 mg (12/28/2018), 378 mg (01/18/2019), 378 mg (02/08/2019) pertuzumab (PERJETA) 840 mg in sodium chloride 0.9 % 250 mL chemo infusion, 840 mg (100 % of original dose 840 mg), Intravenous, Once, 6 of 6 cycles Dose modification: 840 mg (original dose 840 mg, Cycle 5), 420 mg (original dose 420 mg, Cycle 6) Administration: 840 mg (11/15/2018), 420 mg (12/06/2018), 420 mg (12/28/2018), 420 mg (01/18/2019), 420 mg (02/08/2019)  for chemotherapy treatment.    09/18/2018 Genetic Testing   Negative genetic testing for the breast and gyn cancer panel.  The Breast/GYN gene panel offered by [facility] includes sequencing and rearrangement analysis for the following 23 genes:  ATM, BRCA1, BRCA2, BRIP1, CDH1, CHEK2, EPCAM, MLH1, MSH2, MSH6, NBN, NF1, PALB2, PMS2, PTEN, RAD51C, RAD51D, STK11, and TP53.   The report date is 09/18/2018.   03/12/2019 -  08/20/2019 Chemotherapy   The patient had ado-trastuzumab emtansine (KADCYLA) 140 mg in sodium chloride 0.9 % 250 mL chemo infusion, 2.4 mg/kg = 140 mg (100 % of original dose 2.4 mg/kg), Intravenous, Once, 8 of 9 cycles Dose modification: 2.4 mg/kg (original dose 2.4 mg/kg, Cycle 1, Reason: Patient Age), 3 mg/kg (original dose 2.4 mg/kg, Cycle 3, Reason: Provider Judgment) Administration: 140 mg (03/12/2019), 180 mg (04/23/2019), 180 mg (06/18/2019), 180 mg (04/02/2019), 180 mg (05/28/2019), 180 mg (07/09/2019), 180 mg (07/30/2019), 180 mg (08/20/2019)  for chemotherapy treatment.    10/22/2019 -  Chemotherapy    Patient is on Treatment Plan: BREAST METASTATIC FAM-TRASTUZUMAB DERUXTECAN-NXKI (ENHERTU) Q21D        CANCER STAGING: Cancer Staging No matching staging information was found for the patient.  INTERVAL HISTORY:  Carol Duarte, a 87 y.o. female, returns for routine follow-up and consideration for next cycle of immunotherapy. Jorge was last seen on 11/11/2020.  Due for cycle #17 of trastuzumab and Aloxi today.   Overall, she tells me she has been feeling poorly. She reports "feeling sick" with weakness and dry heaving since having her fluid on 03/16 for about 5 days and had left shoulder pain for 2 days on 03/29 and 03/30 which resolved with Norco. She continues having intermittent burning pain in her left axilla for which she usually takes ibuprofen for mild pain and Norco for severe pain. She continues eating and takes Zofran BID. She reports having hair loss with the current treatment. She spends most of the day sitting, though she is able to  walk to the bathroom and bathe herself; she used to go out on the porch prior to [place] but now is scared to go out. She denies having pleuritic CP, dyspnea, coughing, fevers or chills.  Overall, she feels ready for next cycle of immunotherapy today.    REVIEW OF SYSTEMS:  Review of Systems  Constitutional: Positive for appetite change  (75%) and fatigue (75%). Negative for chills and fever.  Respiratory: Negative for cough and shortness of breath.   Cardiovascular: Negative for chest pain.  Gastrointestinal: Positive for nausea and vomiting (dry heaving).  Musculoskeletal: Positive for myalgias (4/10 L axillary pain occasionally).  All other systems reviewed and are negative.   PAST MEDICAL/SURGICAL HISTORY:  Past Medical History:  Diagnosis Date  . Cancer ([place])    left breast  . History of kidney stones   . Hypercholesteremia   . Hypertension    Past Surgical History:  Procedure Laterality Date  . APPENDECTOMY    . brain cyst removed  2011  . BREAST BIOPSY Left 03/28/2018   Procedure: BREAST BIOPSY;  Surgeon: [name], MD;  Location: [facility];  Service: General;  Laterality: Left;  . CATARACT EXTRACTION W/PHACO Left 11/09/2015   Procedure: CATARACT EXTRACTION PHACO AND INTRAOCULAR LENS PLACEMENT LEFT EYE cde=8.97;  Surgeon: [name], MD;  Location: [facility];  Service: Ophthalmology;  Laterality: Left;  . CATARACT EXTRACTION W/PHACO Right 02/04/2019   Procedure: CATARACT EXTRACTION PHACO AND INTRAOCULAR LENS PLACEMENT (IOC);  Surgeon: [name], MD;  Location: [facility];  Service: Ophthalmology;  Laterality: Right;  CDE: 15.19  . EYE SURGERY     KPE left  . MASTECTOMY MODIFIED RADICAL Left 05/28/2018   Procedure: LEFT MODIFIED RADICAL MASTECTOMY;  Surgeon: [name], MD;  Location: [facility];  Service: General;  Laterality: Left;  Marland Kitchen MASTECTOMY, PARTIAL Left 04/24/2017   Procedure: MASTECTOMY PARTIAL;  Surgeon: [name], MD;  Location: [facility];  Service: General;  Laterality: Left;  . ORIF FEMUR FRACTURE Left 09/03/2019   Procedure: OPEN REDUCTION INTERNAL FIXATION (ORIF) LEFT HIP FEMUR FRACTURE;  Surgeon: Paralee Cancel, MD;  Location: [facility];  Service: Orthopedics;  Laterality: Left;  . PORTACATH PLACEMENT Right 08/08/2018   Procedure: INSERTION PORT-A-CATH;  Surgeon: [name], MD;  Location: [facility];   Service: General;  Laterality: Right;  . TONSILLECTOMY    . TONSILLECTOMY AND ADENOIDECTOMY      SOCIAL HISTORY:  Social History   Socioeconomic History  . Marital status: Widowed    Spouse name: Not on file  . Number of children: Not on file  . Years of education: Not on file  . Highest education level: Not on file  Occupational History  . Not on file  Tobacco Use  . Smoking status: Never Smoker  . Smokeless tobacco: Never Used  Vaping Use  . Vaping Use: Never used  Substance and Sexual Activity  . Alcohol use: No  . Drug use: No  . Sexual activity: Not Currently    Partners: Male  Other Topics Concern  . Not on file  Social History Narrative  . Not on file   Social Determinants of Health   Financial Resource Strain: Low Risk   . Difficulty of Paying Living Expenses: Not hard at all  Food Insecurity: No Food Insecurity  . Worried About Charity fundraiser in the Last Year: Never true  . Ran Out of Food in the Last Year: Never true  Transportation Needs: No Transportation Needs  . Lack of Transportation (  Medical): No  . Lack of Transportation (Non-Medical): No  Physical Activity: Insufficiently Active  . Days of Exercise per Week: 3 days  . Minutes of Exercise per Session: 20 min  Stress: No Stress Concern Present  . Feeling of Stress : Only a little  Social Connections: Moderately Isolated  . Frequency of Communication with Friends and Family: More than three times a week  . Frequency of Social Gatherings with Friends and Family: More than three times a week  . Attends Religious Services: 1 to 4 times per year  . Active Member of Clubs or Organizations: Not on file  . Attends Archivist Meetings: Never  . Marital Status: Widowed  Intimate Partner Violence: Not At Risk  . Fear of Current or Ex-Partner: No  . Emotionally Abused: No  . Physically Abused: No  . Sexually Abused: No    FAMILY HISTORY:  Family History  Problem Relation Age of Onset   . Heart failure Mother   . Heart failure Father   . Congestive Heart Failure Brother   . Tuberculosis Paternal Uncle   . Tuberculosis Maternal Grandmother   . Tuberculosis Maternal Grandfather   . Congestive Heart Failure Brother     CURRENT MEDICATIONS:  Current Outpatient Medications  Medication Sig Dispense Refill  . Ado-Trastuzumab Emtansine (KADCYLA IV) Inject into the vein every 21 ( twenty-one) days.    Marland Kitchen amLODipine (NORVASC) 5 MG tablet Take 5 mg by mouth daily.    . ferrous sulfate 325 (65 FE) MG tablet Take 325 mg by mouth daily with breakfast.    . FLUZONE HIGH-DOSE QUADRIVALENT 0.7 ML SUSY     . HYDROcodone-acetaminophen (NORCO/VICODIN) 5-325 MG tablet Take 1 tablet by mouth every 6 (six) hours as needed for moderate pain or severe pain. 30 tablet 0  . labetalol (NORMODYNE) 100 MG tablet Take 1 tablet (100 mg total) by mouth 2 (two) times daily. 60 tablet 12  . megestrol (MEGACE) 400 MG/10ML suspension Take 10 mLs (400 mg total) by mouth 2 (two) times daily. 480 mL 0  . ondansetron (ZOFRAN) 8 MG tablet Take 0.5 tablets (4 mg total) by mouth every 8 (eight) hours as needed for nausea or vomiting. 60 tablet 3  . oxybutynin (DITROPAN) 5 MG tablet Take 5 mg by mouth daily.     Marland Kitchen PREVIDENT 5000 BOOSTER PLUS 1.1 % PSTE APPLY WITH TOOTHBRUSH AT BEDTIME AS DIRECTED.    Marland Kitchen simvastatin (ZOCOR) 40 MG tablet Take 40 mg by mouth daily.    . temazepam (RESTORIL) 30 MG capsule TAKE 1 CAPSULE BY MOUTH AT BEDTIME AS NEEDED FOR SLEEP 30 capsule 5   No current facility-administered medications for this visit.    ALLERGIES:  Allergies  Allergen Reactions  . Contrast Media [Iodinated Diagnostic Agents] Diarrhea    PHYSICAL EXAM:  Performance status (ECOG): 2 - Symptomatic, <50% confined to bed  Vitals:   11/26/20 0833  BP: 129/67  Pulse: (!) 56  Resp: 18  Temp: (!) 96.8 F (36 C)  SpO2: 99%   Wt Readings from Last 3 Encounters:  11/26/20 98 lb 9.6 oz (44.7 kg)  11/11/20 95 lb  12.8 oz (43.5 kg)  10/19/20 102 lb (46.3 kg)   Physical Exam Vitals reviewed.  Constitutional:      Appearance: Normal appearance.     Comments: In wheelchair  Cardiovascular:     Rate and Rhythm: Normal rate and regular rhythm.     Pulses: Normal pulses.     Heart  sounds: Normal heart sounds.  Pulmonary:     Effort: Pulmonary effort is normal.     Breath sounds: Normal breath sounds.  Chest:     Chest Cansler: No tenderness.     Comments: Port-a-Cath in R chest Abdominal:     Palpations: Abdomen is soft. There is no mass.     Tenderness: There is no abdominal tenderness.  Musculoskeletal:     Right lower leg: No edema.     Left lower leg: No edema.  Neurological:     General: No focal deficit present.     Mental Status: She is alert and oriented to person, place, and time.  Psychiatric:        Mood and Affect: Mood normal.        Behavior: Behavior normal.     LABORATORY DATA:  I have reviewed the labs as listed.  CBC Latest Ref Rng & Units 11/26/2020 11/11/2020 10/19/2020  WBC 4.0 - 10.5 K/uL 5.1 4.0 4.7  Hemoglobin 12.0 - 15.0 g/dL 11.4(L) 11.2(L) 11.1(L)  Hematocrit 36.0 - 46.0 % 36.4 35.6(L) 34.3(L)  Platelets 150 - 400 K/uL 197 179 178   CMP Latest Ref Rng & Units 11/26/2020 11/11/2020 10/19/2020  Glucose 70 - 99 mg/dL 128(H) 172(H) 126(H)  BUN 8 - 23 mg/dL 27(H) 21 30(H)  Creatinine 0.44 - 1.00 mg/dL 1.11(H) 1.10(H) 1.10(H)  Sodium 135 - 145 mmol/L 139 140 140  Potassium 3.5 - 5.1 mmol/L 4.3 4.4 4.1  Chloride 98 - 111 mmol/L 106 107 113(H)  CO2 22 - 32 mmol/L 24 24 19(L)  Calcium 8.9 - 10.3 mg/dL 9.0 8.8(L) 9.1  Total Protein 6.5 - 8.1 g/dL 6.1(L) 5.7(L) 5.9(L)  Total Bilirubin 0.3 - 1.2 mg/dL 0.6 0.6 0.6  Alkaline Phos 38 - 126 U/L 44 46 41  AST 15 - 41 U/L _0 ALT 0 - 44 U/L _1 Lab Results  Component Value Date   LDH 127 11/26/2020   LDH 140 08/27/2020   LDH 119 07/29/2020    DIAGNOSTIC IMAGING:  I have independently reviewed the scans and  discussed with the patient. No results found.   ASSESSMENT:  1. Metastatic HER-2 positive left breast cancer: -Enhertu started on 10/22/2019. -CT CAP on 01/10/2020 showed response to therapy with decreasing lung nodules as well as supraclavicular and mediastinal adenopathy with no new evidence of metastatic disease. -CA 15-3 of 6.4 on 02/27/2020. -CT CAP on 04/08/2020 shows more or less stable lung and liver lesions. Left infrahilar mass measures 5.6 x 5.3 cm, previously 5.4 x 5.4 cm. No new areas were seen. Small dependent bilateral pleural effusions are new. -We will do nextCT scan WITHOUT PO CONTRAST. -PET scan on 06/22/2020 reviewed by me with bilateral scattered hypermetabolic pulmonary metastatic lesions slightly stable to improved from CT scan from 04/08/2020. No extrathoracic lesions. Small bilateral pleural effusions. -PET CT scan on 09/25/2020 showed equivocal response with some lesions decreasing in size and some lesions increasing in size up to 3 mm.  2. High risk drug monitoring: -Echo on 11/12/2019, EF 60-65%. -Echo on 02/24/2020 with EF 60-65%. -2D echo on 06/08/2020 with EF 65 to 70%.   PLAN:  1. Metastatic HER-2 positive left breast cancer: -Last dose of Enhertu was approximately 5 weeks ago. -She reports that she has been feeling "sick" and she explains it as horrible.  Most of the time she is sitting in the chair and watching TV.  Today she feels better. -We reviewed  her labs which showed normal CBC and LFTs.  Mild elevated creatinine is stable. -She will proceed with lowest dose of Enhertu at 3.2 mg/kg.  If she does not tolerate it well today, we will think of alternatives. -Discussed with her daughter Jeannene Patella on the phone in the patient's room. -Will order CT of the chest with contrast to evaluate for left chest [place] pain.  2. High risk drug monitoring: -Last echo on 06/08/2020 was normal.  We will order another echo in 3 weeks.  3. Nausea: -This is better  controlled since we increased the Zofran to 8 mg.  She is taking twice daily which is helping.  4. Difficulty sleeping: -Continue temazepam 30 mg at bedtime.  5. Weight loss: -She lost about 7 pounds in the last 4 weeks but has gained back about 3 pounds. -She could not start Megace because of insurance reasons.  She is eating well according to her daughter.  6.  Left chest [place] pain: -Previously she used to have left axillary pain which resolved.  She had 2 episodes of left chest [place] pain this Tuesday and Wednesday when she had to take hydrocodone.  Otherwise she takes ibuprofen for minor pains.  Examination today did not reveal any tenderness.  No masses palpable. -I recommend CT chest with contrast.   Orders placed this encounter:  Orders Placed This Encounter  Procedures  . CT Chest W Contrast     Derek Jack, MD [place] [phone]   I, Milinda Antis, am acting as a scribe for Dr. Sanda Linger.  I, Derek Jack MD, have reviewed the above documentation for accuracy and completeness, and I agree with the above.

## 2020-11-26 NOTE — Patient Instructions (Signed)
[facility] Discharge Instructions for Patients Receiving Chemotherapy  Today you received the following chemotherapy agents   To help prevent nausea and vomiting after your treatment, we encourage you to take your nausea medication   If you develop nausea and vomiting that is not controlled by your nausea medication, call the clinic.   BELOW ARE SYMPTOMS THAT SHOULD BE REPORTED IMMEDIATELY:  *FEVER GREATER THAN 100.5 F  *CHILLS WITH OR WITHOUT FEVER  NAUSEA AND VOMITING THAT IS NOT CONTROLLED WITH YOUR NAUSEA MEDICATION  *UNUSUAL SHORTNESS OF BREATH  *UNUSUAL BRUISING OR BLEEDING  TENDERNESS IN MOUTH AND THROAT WITH OR WITHOUT PRESENCE OF ULCERS  *URINARY PROBLEMS  *BOWEL PROBLEMS  UNUSUAL RASH Items with * indicate a potential emergency and should be followed up as soon as possible.  Feel free to call the clinic should you have any questions or concerns. The clinic phone number is [phone].  Please show the CHEMO ALERT CARD at check-in to the Emergency Department and triage nurse.   

## 2020-11-27 LAB — CANCER ANTIGEN 15-3: CA 15-3: 7.3 U/mL (ref 0.0–25.0)

## 2020-12-01 ENCOUNTER — Other Ambulatory Visit (HOSPITAL_COMMUNITY): Payer: Self-pay | Admitting: Hematology

## 2020-12-06 DIAGNOSIS — C50919 Malignant neoplasm of unspecified site of unspecified female breast: Secondary | ICD-10-CM | POA: Diagnosis not present

## 2020-12-06 DIAGNOSIS — I10 Essential (primary) hypertension: Secondary | ICD-10-CM | POA: Diagnosis not present

## 2020-12-15 ENCOUNTER — Ambulatory Visit (HOSPITAL_COMMUNITY)
Admission: RE | Admit: 2020-12-15 | Discharge: 2020-12-15 | Disposition: A | Discharge status: Home / Self Care | Payer: Medicare Other | Source: Ambulatory Visit | Attending: Hematology | Admitting: Hematology

## 2020-12-15 DIAGNOSIS — R0782 Intercostal pain: Secondary | ICD-10-CM | POA: Diagnosis not present

## 2020-12-15 DIAGNOSIS — C50912 Malignant neoplasm of unspecified site of left female breast: Secondary | ICD-10-CM | POA: Insufficient documentation

## 2020-12-15 DIAGNOSIS — K449 Diaphragmatic hernia without obstruction or gangrene: Secondary | ICD-10-CM | POA: Diagnosis not present

## 2020-12-15 DIAGNOSIS — J90 Pleural effusion, not elsewhere classified: Secondary | ICD-10-CM | POA: Diagnosis not present

## 2020-12-15 DIAGNOSIS — J439 Emphysema, unspecified: Secondary | ICD-10-CM | POA: Diagnosis not present

## 2020-12-15 DIAGNOSIS — Z853 Personal history of malignant neoplasm of breast: Secondary | ICD-10-CM | POA: Diagnosis not present

## 2020-12-15 MED ORDER — IOHEXOL 300 MG/ML  SOLN
50.0000 mL | Freq: Once | INTRAMUSCULAR | Status: AC | PRN
  Administered 2020-12-15: 60 mL via INTRAVENOUS

## 2020-12-17 ENCOUNTER — Encounter (HOSPITAL_COMMUNITY): Payer: Self-pay | Admitting: Hematology

## 2020-12-17 ENCOUNTER — Other Ambulatory Visit: Payer: Self-pay

## 2020-12-17 ENCOUNTER — Inpatient Hospital Stay (HOSPITAL_COMMUNITY): Payer: Medicare Other | Attending: Hematology

## 2020-12-17 ENCOUNTER — Inpatient Hospital Stay (HOSPITAL_BASED_OUTPATIENT_CLINIC_OR_DEPARTMENT_OTHER): Payer: Medicare Other | Admitting: Hematology

## 2020-12-17 ENCOUNTER — Inpatient Hospital Stay (HOSPITAL_COMMUNITY): Payer: Medicare Other

## 2020-12-17 VITALS — BP 111/48 | HR 100 | Temp 96.9°F | Resp 18

## 2020-12-17 DIAGNOSIS — Z8249 Family history of ischemic heart disease and other diseases of the circulatory system: Secondary | ICD-10-CM | POA: Insufficient documentation

## 2020-12-17 DIAGNOSIS — R0789 Other chest pain: Secondary | ICD-10-CM | POA: Diagnosis not present

## 2020-12-17 DIAGNOSIS — Z836 Family history of other diseases of the respiratory system: Secondary | ICD-10-CM | POA: Diagnosis not present

## 2020-12-17 DIAGNOSIS — Z79899 Other long term (current) drug therapy: Secondary | ICD-10-CM | POA: Diagnosis not present

## 2020-12-17 DIAGNOSIS — C50912 Malignant neoplasm of unspecified site of left female breast: Secondary | ICD-10-CM | POA: Diagnosis not present

## 2020-12-17 DIAGNOSIS — C50512 Malignant neoplasm of lower-outer quadrant of left female breast: Secondary | ICD-10-CM | POA: Diagnosis not present

## 2020-12-17 DIAGNOSIS — J439 Emphysema, unspecified: Secondary | ICD-10-CM | POA: Insufficient documentation

## 2020-12-17 DIAGNOSIS — R5383 Other fatigue: Secondary | ICD-10-CM | POA: Insufficient documentation

## 2020-12-17 DIAGNOSIS — R079 Chest pain, unspecified: Secondary | ICD-10-CM | POA: Diagnosis not present

## 2020-12-17 DIAGNOSIS — F32A Depression, unspecified: Secondary | ICD-10-CM | POA: Insufficient documentation

## 2020-12-17 DIAGNOSIS — K7689 Other specified diseases of liver: Secondary | ICD-10-CM | POA: Diagnosis not present

## 2020-12-17 DIAGNOSIS — K5900 Constipation, unspecified: Secondary | ICD-10-CM | POA: Insufficient documentation

## 2020-12-17 DIAGNOSIS — I700 Atherosclerosis of aorta: Secondary | ICD-10-CM | POA: Diagnosis not present

## 2020-12-17 DIAGNOSIS — R110 Nausea: Secondary | ICD-10-CM | POA: Diagnosis not present

## 2020-12-17 LAB — COMPREHENSIVE METABOLIC PANEL
ALT: 20 U/L (ref 0–44)
AST: 32 U/L (ref 15–41)
Albumin: 3.4 g/dL — ABNORMAL LOW (ref 3.5–5.0)
Alkaline Phosphatase: 45 U/L (ref 38–126)
Anion gap: 7 (ref 5–15)
BUN: 32 mg/dL — ABNORMAL HIGH (ref 8–23)
CO2: 24 mmol/L (ref 22–32)
Calcium: 8.7 mg/dL — ABNORMAL LOW (ref 8.9–10.3)
Chloride: 108 mmol/L (ref 98–111)
Creatinine, Ser: 1.13 mg/dL — ABNORMAL HIGH (ref 0.44–1.00)
GFR, Estimated: 45 mL/min — ABNORMAL LOW (ref >60)
Glucose, Bld: 115 mg/dL — ABNORMAL HIGH (ref 70–99)
Potassium: 4.1 mmol/L (ref 3.5–5.1)
Sodium: 139 mmol/L (ref 135–145)
Total Bilirubin: 0.7 mg/dL (ref 0.3–1.2)
Total Protein: 5.8 g/dL — ABNORMAL LOW (ref 6.5–8.1)

## 2020-12-17 LAB — CBC WITH DIFFERENTIAL/PLATELET
Abs Immature Granulocytes: 0.01 10*3/uL (ref 0.00–0.07)
Basophils Absolute: 0 10*3/uL (ref 0.0–0.1)
Basophils Relative: 0 %
Eosinophils Absolute: 0 10*3/uL (ref 0.0–0.5)
Eosinophils Relative: 1 %
HCT: 36.6 % (ref 36.0–46.0)
Hemoglobin: 11.5 g/dL — ABNORMAL LOW (ref 12.0–15.0)
Immature Granulocytes: 0 %
Lymphocytes Relative: 15 %
Lymphs Abs: 0.8 10*3/uL (ref 0.7–4.0)
MCH: 32.9 pg (ref 26.0–34.0)
MCHC: 31.4 g/dL (ref 30.0–36.0)
MCV: 104.6 fL — ABNORMAL HIGH (ref 80.0–100.0)
Monocytes Absolute: 0.6 10*3/uL (ref 0.1–1.0)
Monocytes Relative: 10 %
Neutro Abs: 4.1 10*3/uL (ref 1.7–7.7)
Neutrophils Relative %: 74 %
Platelets: 193 10*3/uL (ref 150–400)
RBC: 3.5 MIL/uL — ABNORMAL LOW (ref 3.87–5.11)
RDW: 14.7 % (ref 11.5–15.5)
WBC: 5.6 10*3/uL (ref 4.0–10.5)
nRBC: 0 % (ref 0.0–0.2)

## 2020-12-17 LAB — LACTATE DEHYDROGENASE: LDH: 142 U/L (ref 98–192)

## 2020-12-17 LAB — MAGNESIUM: Magnesium: 2.2 mg/dL (ref 1.7–2.4)

## 2020-12-17 MED ORDER — SODIUM CHLORIDE 0.9% FLUSH
10.0000 mL | Freq: Once | INTRAVENOUS | Status: AC
  Administered 2020-12-17: 10 mL via INTRAVENOUS

## 2020-12-17 MED ORDER — SODIUM CHLORIDE 0.9 % IV SOLN: INTRAVENOUS | Status: DC

## 2020-12-17 MED ORDER — HEPARIN SOD (PORK) LOCK FLUSH 100 UNIT/ML IV SOLN
500.0000 [IU] | Freq: Once | INTRAVENOUS | Status: AC
  Administered 2020-12-17: 500 [IU] via INTRAVENOUS

## 2020-12-17 NOTE — Patient Instructions (Signed)
[facility] at [facility]  Discharge Instructions:   _______________________________________________________________  Thank you for choosing [facility] at [facility] to provide your oncology and hematology care.  To afford each patient quality time with our providers, please arrive at least 15 minutes before your scheduled appointment.  You need to re-schedule your appointment if you arrive 10 or more minutes late.  We strive to give you quality time with our providers, and arriving late affects you and other patients whose appointments are after yours.  Also, if you no show three or more times for appointments you may be dismissed from the clinic.  Again, thank you for choosing  [facility] at [facility]. Our hope is that these requests will allow you access to exceptional care and in a timely manner. _______________________________________________________________  If you have questions after your visit, please contact our office at [phone] between the hours of 8:30 a.m. and 5:00 p.m. Voicemails left after 4:30 p.m. will not be returned until the following business day. _______________________________________________________________  For prescription refill requests, have your pharmacy contact our office. _______________________________________________________________  Recommendations made by the consultant and any test results will be sent to your referring physician. _______________________________________________________________ 

## 2020-12-17 NOTE — Progress Notes (Signed)
Pt came in today feeling extremely fatigued. Family wants her to stop treatment stated per pt. Vitals stable, labs obtained and flluids started per MD order.    No more treatment at this time per MD.  Carol Duarte Daughter to pick up pt.   One liter of fluids given today per MD. Patient tolerated it well without problems. Vitals stable and discharged home from clinic via wheelchair. Follow up as scheduled.

## 2020-12-17 NOTE — Progress Notes (Signed)
Patient was assessed by Dr. Delton Coombes and labs have been reviewed.  NO treatment today. Dr. Delton Coombes is discontinuing treatment due to disease progress and patient is not tolerating treatment well. She will return in 2-3 weeks to discuss other options. Primary RN and pharmacy aware.

## 2020-12-17 NOTE — Patient Instructions (Signed)
[place] at [facility] Discharge Instructions  You were seen today by Dr. Delton Coombes. He went over your recent results and scans; your cancer has progressed with the current treatment and your current therapy regimen will be changed. You will not receive any treatment for 3 weeks. Dr. Delton Coombes will see you back in 3 weeks for labs and follow up.   Thank you for choosing [place] at [facility] to provide your oncology and hematology care.  To afford each patient quality time with our provider, please arrive at least 15 minutes before your scheduled appointment time.   If you have a lab appointment with the [facility] please come in thru the Main Entrance and check in at the main information desk  You need to re-schedule your appointment should you arrive 10 or more minutes late.  We strive to give you quality time with our providers, and arriving late affects you and other patients whose appointments are after yours.  Also, if you no show three or more times for appointments you may be dismissed from the clinic at the providers discretion.     Again, thank you for choosing [facility].  Our hope is that these requests will decrease the amount of time that you wait before being seen by our physicians.       _____________________________________________________________  Should you have questions after your visit to [facility], please contact our office at [phone] between the hours of 8:00 a.m. and 4:30 p.m.  Voicemails left after 4:00 p.m. will not be returned until the following business day.  For prescription refill requests, have your pharmacy contact our office and allow 72 hours.    Cancer Center Support Programs:   > Cancer Support Group  2nd Tuesday of the month 1pm-2pm, Journey Room

## 2020-12-17 NOTE — Progress Notes (Signed)
[place], [place] [id number]   CLINIC:  Medical Oncology/Hematology  PCP:  [name], MD [place] / [place] [address] [phone]   REASON FOR VISIT:  Follow-up for IDC of left breast  PRIOR THERAPY:  1. Lumpectomy on 04/24/2017. 2. Left modified radical mastectomy on 05/28/2018. 3. Trastuzumab x 10 cycles, pertuzumab x 6 cycles, emtansine x 8 cycles.  NGS Results: Not done  CURRENT THERAPY: Trastuzumab & Aloxi every 3 weeks  BRIEF ONCOLOGIC HISTORY:  Oncology History  Invasive ductal carcinoma of left breast ([place])  03/30/2017 Initial Diagnosis   Invasive ductal carcinoma of left breast ([place])   08/09/2018 - 02/28/2019 Chemotherapy   The patient had lapatinib (TYKERB) 250 MG tablet, 1,000 mg, Oral, Daily, 1 of 1 cycle, Start date: --, End date: -- trastuzumab (HERCEPTIN) 500 mg in sodium chloride 0.9 % 250 mL chemo infusion, 525 mg, Intravenous,  Once, 10 of 10 cycles Administration: 500 mg (08/09/2018), 350 mg (09/06/2018), 350 mg (09/27/2018), 378 mg (10/18/2018), 378 mg (11/15/2018), 378 mg (12/06/2018), 378 mg (12/28/2018), 378 mg (01/18/2019), 378 mg (02/08/2019) pertuzumab (PERJETA) 840 mg in sodium chloride 0.9 % 250 mL chemo infusion, 840 mg (100 % of original dose 840 mg), Intravenous, Once, 6 of 6 cycles Dose modification: 840 mg (original dose 840 mg, Cycle 5), 420 mg (original dose 420 mg, Cycle 6) Administration: 840 mg (11/15/2018), 420 mg (12/06/2018), 420 mg (12/28/2018), 420 mg (01/18/2019), 420 mg (02/08/2019)  for chemotherapy treatment.    09/18/2018 Genetic Testing   Negative genetic testing for the breast and gyn cancer panel.  The Breast/GYN gene panel offered by [facility] includes sequencing and rearrangement analysis for the following 23 genes:  ATM, BRCA1, BRCA2, BRIP1, CDH1, CHEK2, EPCAM, MLH1, MSH2, MSH6, NBN, NF1, PALB2, PMS2, PTEN, RAD51C, RAD51D, STK11, and TP53.   The report date is 09/18/2018.   03/12/2019 -  08/20/2019 Chemotherapy   The patient had ado-trastuzumab emtansine (KADCYLA) 140 mg in sodium chloride 0.9 % 250 mL chemo infusion, 2.4 mg/kg = 140 mg (100 % of original dose 2.4 mg/kg), Intravenous, Once, 8 of 9 cycles Dose modification: 2.4 mg/kg (original dose 2.4 mg/kg, Cycle 1, Reason: Patient Age), 3 mg/kg (original dose 2.4 mg/kg, Cycle 3, Reason: Provider Judgment) Administration: 140 mg (03/12/2019), 180 mg (04/23/2019), 180 mg (06/18/2019), 180 mg (04/02/2019), 180 mg (05/28/2019), 180 mg (07/09/2019), 180 mg (07/30/2019), 180 mg (08/20/2019)  for chemotherapy treatment.    10/22/2019 -  Chemotherapy    Patient is on Treatment Plan: BREAST METASTATIC FAM-TRASTUZUMAB DERUXTECAN-NXKI (ENHERTU) Q21D        CANCER STAGING: Cancer Staging No matching staging information was found for the patient.  INTERVAL HISTORY:  Carol Duarte, a 87 y.o. female, returns for routine follow-up and consideration for next cycle of immunotherapy. Carol Duarte was last seen on 11/26/2020.  Due for cycle #18 of trastuzumab and Aloxi today.   Overall, Carol Duarte tells me Carol Duarte has been feeling poorly. Carol Duarte reports that Carol Duarte felt terrible and weak and spent most of the past 3 weeks in bed. Carol Duarte also reports having left axillary chest [place] pain every day and reports having a terrible episode of pain last week while Carol Duarte was lying in bed and was almost sent to the ED for MI check-up. Her pain usually lasts about 2 hours if Carol Duarte takes Norco. Carol Duarte reports that Carol Duarte does not want to get any treatment for some time due to feeling poorly from it. Carol Duarte  continues eating small meals throughout the day.  Carol Duarte will forego treatment due to progression and will get some time off from the treatment.    REVIEW OF SYSTEMS:  Review of Systems  Constitutional: Positive for appetite change (25%) and fatigue (25%).  HENT:   Positive for trouble swallowing.   Cardiovascular: Positive for chest pain (L axillary chest [place] pain).  Gastrointestinal:  Positive for constipation and nausea.  Psychiatric/Behavioral: Positive for depression.  All other systems reviewed and are negative.   PAST MEDICAL/SURGICAL HISTORY:  Past Medical History:  Diagnosis Date  . Cancer ([place])    left breast  . History of kidney stones   . Hypercholesteremia   . Hypertension    Past Surgical History:  Procedure Laterality Date  . APPENDECTOMY    . brain cyst removed  2011  . BREAST BIOPSY Left 03/28/2018   Procedure: BREAST BIOPSY;  Surgeon: [name], MD;  Location: [facility];  Service: General;  Laterality: Left;  . CATARACT EXTRACTION W/PHACO Left 11/09/2015   Procedure: CATARACT EXTRACTION PHACO AND INTRAOCULAR LENS PLACEMENT LEFT EYE cde=8.97;  Surgeon: [name], MD;  Location: [facility];  Service: Ophthalmology;  Laterality: Left;  . CATARACT EXTRACTION W/PHACO Right 02/04/2019   Procedure: CATARACT EXTRACTION PHACO AND INTRAOCULAR LENS PLACEMENT (IOC);  Surgeon: [name], MD;  Location: [facility];  Service: Ophthalmology;  Laterality: Right;  CDE: 15.19  . EYE SURGERY     KPE left  . MASTECTOMY MODIFIED RADICAL Left 05/28/2018   Procedure: LEFT MODIFIED RADICAL MASTECTOMY;  Surgeon: [name], MD;  Location: [facility];  Service: General;  Laterality: Left;  Marland Kitchen MASTECTOMY, PARTIAL Left 04/24/2017   Procedure: MASTECTOMY PARTIAL;  Surgeon: [name], MD;  Location: [facility];  Service: General;  Laterality: Left;  . ORIF FEMUR FRACTURE Left 09/03/2019   Procedure: OPEN REDUCTION INTERNAL FIXATION (ORIF) LEFT HIP FEMUR FRACTURE;  Surgeon: Paralee Cancel, MD;  Location: [facility];  Service: Orthopedics;  Laterality: Left;  . PORTACATH PLACEMENT Right 08/08/2018   Procedure: INSERTION PORT-A-CATH;  Surgeon: [name], MD;  Location: [facility];  Service: General;  Laterality: Right;  . TONSILLECTOMY    . TONSILLECTOMY AND ADENOIDECTOMY      SOCIAL HISTORY:  Social History   Socioeconomic History  . Marital status: Widowed    Spouse name: Not on file   . Number of children: Not on file  . Years of education: Not on file  . Highest education level: Not on file  Occupational History  . Not on file  Tobacco Use  . Smoking status: Never Smoker  . Smokeless tobacco: Never Used  Vaping Use  . Vaping Use: Never used  Substance and Sexual Activity  . Alcohol use: No  . Drug use: No  . Sexual activity: Not Currently    Partners: Male  Other Topics Concern  . Not on file  Social History Narrative  . Not on file   Social Determinants of Health   Financial Resource Strain: Low Risk   . Difficulty of Paying Living Expenses: Not hard at all  Food Insecurity: No Food Insecurity  . Worried About Charity fundraiser in the Last Year: Never true  . Ran Out of Food in the Last Year: Never true  Transportation Needs: No Transportation Needs  . Lack of Transportation (Medical): No  . Lack of Transportation (Non-Medical): No  Physical Activity: Insufficiently Active  . Days of Exercise per Week: 3 days  . Minutes of Exercise per Session:  20 min  Stress: No Stress Concern Present  . Feeling of Stress : Only a little  Social Connections: Moderately Isolated  . Frequency of Communication with Friends and Family: More than three times a week  . Frequency of Social Gatherings with Friends and Family: More than three times a week  . Attends Religious Services: 1 to 4 times per year  . Active Member of Clubs or Organizations: Not on file  . Attends Archivist Meetings: Never  . Marital Status: Widowed  Intimate Partner Violence: Not At Risk  . Fear of Current or Ex-Partner: No  . Emotionally Abused: No  . Physically Abused: No  . Sexually Abused: No    FAMILY HISTORY:  Family History  Problem Relation Age of Onset  . Heart failure Mother   . Heart failure Father   . Congestive Heart Failure Brother   . Tuberculosis Paternal Uncle   . Tuberculosis Maternal Grandmother   . Tuberculosis Maternal Grandfather   . Congestive  Heart Failure Brother     CURRENT MEDICATIONS:  Current Outpatient Medications  Medication Sig Dispense Refill  . Ado-Trastuzumab Emtansine (KADCYLA IV) Inject into the vein every 21 ( twenty-one) days.    Marland Kitchen amLODipine (NORVASC) 5 MG tablet Take 5 mg by mouth daily.    . diphenhydrAMINE (BENADRYL) 50 MG tablet Take 1 tablet (50 mg total) by mouth at bedtime as needed for itching. One tablet 1 hr before procedure 1 tablet 0  . ferrous sulfate 325 (65 FE) MG tablet Take 325 mg by mouth daily with breakfast.    . FLUZONE HIGH-DOSE QUADRIVALENT 0.7 ML SUSY     . HYDROcodone-acetaminophen (NORCO/VICODIN) 5-325 MG tablet Take 1 tablet by mouth every 6 (six) hours as needed for moderate pain or severe pain. 30 tablet 0  . labetalol (NORMODYNE) 100 MG tablet Take 1 tablet (100 mg total) by mouth 2 (two) times daily. 60 tablet 12  . megestrol (MEGACE) 400 MG/10ML suspension Take 10 mLs (400 mg total) by mouth 2 (two) times daily. 480 mL 0  . ondansetron (ZOFRAN) 8 MG tablet Take 0.5 tablets (4 mg total) by mouth every 8 (eight) hours as needed for nausea or vomiting. 60 tablet 3  . oxybutynin (DITROPAN) 5 MG tablet Take 5 mg by mouth daily.     . predniSONE (DELTASONE) 50 MG tablet Take one tablet 13hr, 7hr, and 1 hr before procedure 3 tablet 0  . PREVIDENT 5000 BOOSTER PLUS 1.1 % PSTE APPLY WITH TOOTHBRUSH AT BEDTIME AS DIRECTED.    Marland Kitchen simvastatin (ZOCOR) 40 MG tablet Take 40 mg by mouth daily.    . temazepam (RESTORIL) 30 MG capsule TAKE 1 CAPSULE BY MOUTH AT BEDTIME AS NEEDED FOR SLEEP *MAY INCREASE TO 2 CAPSULES IF 1 DOES NOT HELP* 30 capsule 0   No current facility-administered medications for this visit.   Facility-Administered Medications Ordered in Other Visits  Medication Dose Route Frequency Provider Last Rate Last Admin  . 0.9 %  sodium chloride infusion   Intravenous Continuous Derek Jack, MD 500 mL/hr at 12/17/20 0932 New Bag at 12/17/20 0932    ALLERGIES:  Allergies   Allergen Reactions  . Contrast Media [Iodinated Diagnostic Agents] Diarrhea    Patient is NOT allergic to IV contrast. The Readi cat (oral contrast) caused diarrhea which is a side effect of oral contrast.     PHYSICAL EXAM:  Performance status (ECOG): 2 - Symptomatic, <50% confined to bed  Vitals:   12/17/20 0935  BP: (!) 111/48  Pulse: 100  Resp: 18  Temp: (!) 96.9 F (36.1 C)  SpO2: 95%   Wt Readings from Last 3 Encounters:  12/17/20 98 lb 3.2 oz (44.5 kg)  11/26/20 98 lb 9.6 oz (44.7 kg)  11/11/20 95 lb 12.8 oz (43.5 kg)   Physical Exam Vitals reviewed.  Constitutional:      Appearance: Normal appearance.  Cardiovascular:     Rate and Rhythm: Normal rate and regular rhythm.     Pulses: Normal pulses.     Heart sounds: Normal heart sounds.  Pulmonary:     Effort: Pulmonary effort is normal.     Breath sounds: Normal breath sounds.  Chest:     Comments: Port-a-Cath in R chest Abdominal:     Palpations: Abdomen is soft. There is no mass.     Tenderness: There is no abdominal tenderness.  Neurological:     General: No focal deficit present.     Mental Status: Carol Duarte is alert and oriented to person, place, and time.  Psychiatric:        Mood and Affect: Mood normal.        Behavior: Behavior normal.     LABORATORY DATA:  I have reviewed the labs as listed.  CBC Latest Ref Rng & Units 12/17/2020 11/26/2020 11/11/2020  WBC 4.0 - 10.5 K/uL 5.6 5.1 4.0  Hemoglobin 12.0 - 15.0 g/dL 11.5(L) 11.4(L) 11.2(L)  Hematocrit 36.0 - 46.0 % 36.6 36.4 35.6(L)  Platelets 150 - 400 K/uL 193 197 179   CMP Latest Ref Rng & Units 12/17/2020 11/26/2020 11/11/2020  Glucose 70 - 99 mg/dL 115(H) 128(H) 172(H)  BUN 8 - 23 mg/dL 32(H) 27(H) 21  Creatinine 0.44 - 1.00 mg/dL 1.13(H) 1.11(H) 1.10(H)  Sodium 135 - 145 mmol/L 139 139 140  Potassium 3.5 - 5.1 mmol/L 4.1 4.3 4.4  Chloride 98 - 111 mmol/L 108 106 107  CO2 22 - 32 mmol/L '24 24 24  ' Calcium 8.9 - 10.3 mg/dL 8.7(L) 9.0 8.8(L)  Total  Protein 6.5 - 8.1 g/dL 5.8(L) 6.1(L) 5.7(L)  Total Bilirubin 0.3 - 1.2 mg/dL 0.7 0.6 0.6  Alkaline Phos 38 - 126 U/L 45 44 46  AST 15 - 41 U/L 32 26 29  ALT 0 - 44 U/L '20 16 18   ' Lab Results  Component Value Date   LDH 142 12/17/2020   LDH 127 11/26/2020   LDH 140 08/27/2020    DIAGNOSTIC IMAGING:  I have independently reviewed the scans and discussed with the patient. CT Chest W Contrast  Result Date: 12/15/2020 CLINICAL DATA:  Left-sided chest pain. History of breast cancer status post lumpectomy and chemotherapy in 2018. EXAM: CT CHEST WITH CONTRAST TECHNIQUE: Multidetector CT imaging of the chest was performed during intravenous contrast administration. CONTRAST:  40m OMNIPAQUE IOHEXOL 300 MG/ML  SOLN COMPARISON:  CT scan 04/08/2020 and PET-CT 09/25/2020 FINDINGS: Cardiovascular: The heart is normal in size. No pericardial effusion. Stable mild tortuosity and calcification of the thoracic aorta. Stable mild pulmonary artery enlargement which could suggest pulmonary hypertension. Mediastinum/Nodes: Stable calcified mediastinal and hilar lymph nodes. Stable large hiatal hernia. Lungs/Pleura: Dominant left infrahilar/left lower lobe partially calcified mass measures 6.0 x 6.0 cm and previously measured 5.8 x 5.0 cm. Calcified right lower lobe lung mass on image 87/2 measures a maximum of 3 cm and previously measured 2.7 cm. Peripheral left upper lobe lung nodule on image 30/4 measures 14 mm and previously measured 10.5 mm. 2.5 cm right upper lobe lesion  adjacent to the major fissure on image number 71/4 measures 2.5 cm and previously measured 2.3 cm. Medial right lower lobe lung mass on image 105/4 measures 27 mm and previously measured 25 mm. Small bilateral pleural effusions are noted. Upper Abdomen: No significant upper abdominal findings. Stable hepatic cysts. Musculoskeletal: No significant bony findings. No lytic or sclerotic bone lesions are identified. IMPRESSION: 1. Slight interval  increase in size of the dominant left infrahilar/left lower lobe lung mass and other bilateral pulmonary lesions. 2. Stable calcified mediastinal and hilar lymph nodes. 3. Small bilateral pleural effusions. 4. Stable large hiatal hernia. 5. Stable hepatic cysts. Aortic Atherosclerosis (ICD10-I70.0) and Emphysema (ICD10-J43.9). Electronically Signed   By: Marijo Sanes M.D.   On: 12/15/2020 21:09     ASSESSMENT:  1. Metastatic HER-2 positive left breast cancer: -Enhertu started on 10/22/2019. -CT CAP on 01/10/2020 showed response to therapy with decreasing lung nodules as well as supraclavicular and mediastinal adenopathy with no new evidence of metastatic disease. -CA 15-3 of 6.4 on 02/27/2020. -CT CAP on 04/08/2020 shows more or less stable lung and liver lesions. Left infrahilar mass measures 5.6 x 5.3 cm, previously 5.4 x 5.4 cm. No new areas were seen. Small dependent bilateral pleural effusions are new. -We will do nextCT scan WITHOUT PO CONTRAST. -PET scan on 06/22/2020 reviewed by me with bilateral scattered hypermetabolic pulmonary metastatic lesions slightly stable to improved from CT scan from 04/08/2020. No extrathoracic lesions. Small bilateral pleural effusions. -PET CT scan on 09/25/2020 showed equivocal response with some lesions decreasing in size and some lesions increasing in size up to 3 mm.  2. High risk drug monitoring: -Echo on 11/12/2019, EF 60-65%. -Echo on 02/24/2020 with EF 60-65%. -2D echo on 06/08/2020 with EF 65 to 70%.   PLAN:  1. Metastatic HER-2 positive left breast cancer: -Last dose of Enhertu was on 11/26/2020. - We reviewed CT scan from 12/15/2020 which showed slight interval increase in size of the dominant left infrahilar/left lower lobe lung mass and another bilateral pulmonary lesions.  Small bilateral pleural effusions.  Stable calcified mediastinal and hilar lymph nodes.  The growth of these lung nodules are from few millimeters to 1 cm. - Carol Duarte is  feeling very tired and had been in bed for almost 2 weeks after last treatment. - I have recommended discontinuation of Enhertu because of tiredness and mild progression. - We will reevaluate her in 3 weeks.  If her physical condition improves, will consider either lapatinib with Herceptin or Eugene Garnet been with Herceptin. - I have called and talked to her daughter on the phone and updated the plan.  2. High risk drug monitoring: -Last echo on 06/08/2020 was normal.  3.Nausea: -Continue Zofran 8 mg twice daily as needed.  4. Difficulty sleeping: -Continue temazepam 30 mg at bedtime as needed.  5. Weight loss: -Her appetite is up-and-down.  Carol Duarte could not start Megace due to insurance reasons.  Weight has been stable today at 98.  6.  Left chest [place] pain: -Left anterior chest [place] pain is episodic for which Carol Duarte takes hydrocodone as needed. - This is from underlying pleural metastasis.   Orders placed this encounter:  No orders of the defined types were placed in this encounter.    Derek Jack, MD [place] [phone]   I, Milinda Antis, am acting as a scribe for Dr. Sanda Linger.  I, Derek Jack MD, have reviewed the above documentation for accuracy and completeness, and I agree with the above.

## 2020-12-18 LAB — CANCER ANTIGEN 15-3: CA 15-3: 8 U/mL (ref 0.0–25.0)

## 2020-12-21 ENCOUNTER — Other Ambulatory Visit (HOSPITAL_COMMUNITY): Payer: Self-pay

## 2020-12-21 DIAGNOSIS — C50912 Malignant neoplasm of unspecified site of left female breast: Secondary | ICD-10-CM

## 2020-12-30 ENCOUNTER — Other Ambulatory Visit (HOSPITAL_COMMUNITY): Payer: Self-pay | Admitting: Hematology

## 2021-01-01 ENCOUNTER — Other Ambulatory Visit (HOSPITAL_COMMUNITY): Payer: Self-pay

## 2021-01-01 MED ORDER — TEMAZEPAM 30 MG PO CAPS: ORAL_CAPSULE | ORAL | 0 refills | Status: DC

## 2021-01-04 ENCOUNTER — Telehealth (HOSPITAL_COMMUNITY): Payer: Self-pay | Admitting: Pharmacist

## 2021-01-04 ENCOUNTER — Inpatient Hospital Stay (HOSPITAL_COMMUNITY): Payer: Medicare Other

## 2021-01-04 ENCOUNTER — Other Ambulatory Visit: Payer: Self-pay

## 2021-01-04 ENCOUNTER — Other Ambulatory Visit (HOSPITAL_COMMUNITY): Payer: Self-pay

## 2021-01-04 ENCOUNTER — Telehealth (HOSPITAL_COMMUNITY): Payer: Self-pay | Admitting: Pharmacy Technician

## 2021-01-04 ENCOUNTER — Inpatient Hospital Stay (HOSPITAL_COMMUNITY): Payer: Medicare Other | Attending: Hematology | Admitting: Hematology

## 2021-01-04 ENCOUNTER — Other Ambulatory Visit (HOSPITAL_COMMUNITY): Payer: Self-pay | Admitting: *Deleted

## 2021-01-04 VITALS — BP 104/50 | HR 52 | Temp 96.9°F | Resp 18 | Wt 95.4 lb

## 2021-01-04 DIAGNOSIS — G479 Sleep disorder, unspecified: Secondary | ICD-10-CM | POA: Diagnosis not present

## 2021-01-04 DIAGNOSIS — C50912 Malignant neoplasm of unspecified site of left female breast: Secondary | ICD-10-CM

## 2021-01-04 DIAGNOSIS — Z8249 Family history of ischemic heart disease and other diseases of the circulatory system: Secondary | ICD-10-CM | POA: Insufficient documentation

## 2021-01-04 DIAGNOSIS — K449 Diaphragmatic hernia without obstruction or gangrene: Secondary | ICD-10-CM | POA: Insufficient documentation

## 2021-01-04 DIAGNOSIS — R634 Abnormal weight loss: Secondary | ICD-10-CM | POA: Insufficient documentation

## 2021-01-04 DIAGNOSIS — Z7952 Long term (current) use of systemic steroids: Secondary | ICD-10-CM | POA: Insufficient documentation

## 2021-01-04 DIAGNOSIS — R490 Dysphonia: Secondary | ICD-10-CM | POA: Insufficient documentation

## 2021-01-04 DIAGNOSIS — Z9049 Acquired absence of other specified parts of digestive tract: Secondary | ICD-10-CM | POA: Diagnosis not present

## 2021-01-04 DIAGNOSIS — J90 Pleural effusion, not elsewhere classified: Secondary | ICD-10-CM | POA: Diagnosis not present

## 2021-01-04 DIAGNOSIS — R197 Diarrhea, unspecified: Secondary | ICD-10-CM | POA: Insufficient documentation

## 2021-01-04 DIAGNOSIS — R911 Solitary pulmonary nodule: Secondary | ICD-10-CM | POA: Insufficient documentation

## 2021-01-04 DIAGNOSIS — J439 Emphysema, unspecified: Secondary | ICD-10-CM | POA: Insufficient documentation

## 2021-01-04 DIAGNOSIS — K7689 Other specified diseases of liver: Secondary | ICD-10-CM | POA: Diagnosis not present

## 2021-01-04 DIAGNOSIS — Z79899 Other long term (current) drug therapy: Secondary | ICD-10-CM | POA: Insufficient documentation

## 2021-01-04 DIAGNOSIS — Z9221 Personal history of antineoplastic chemotherapy: Secondary | ICD-10-CM | POA: Insufficient documentation

## 2021-01-04 DIAGNOSIS — K5900 Constipation, unspecified: Secondary | ICD-10-CM | POA: Insufficient documentation

## 2021-01-04 DIAGNOSIS — Z836 Family history of other diseases of the respiratory system: Secondary | ICD-10-CM | POA: Diagnosis not present

## 2021-01-04 DIAGNOSIS — C50512 Malignant neoplasm of lower-outer quadrant of left female breast: Secondary | ICD-10-CM | POA: Insufficient documentation

## 2021-01-04 DIAGNOSIS — R5383 Other fatigue: Secondary | ICD-10-CM | POA: Insufficient documentation

## 2021-01-04 DIAGNOSIS — R110 Nausea: Secondary | ICD-10-CM | POA: Insufficient documentation

## 2021-01-04 DIAGNOSIS — Z888 Allergy status to other drugs, medicaments and biological substances status: Secondary | ICD-10-CM | POA: Insufficient documentation

## 2021-01-04 DIAGNOSIS — I700 Atherosclerosis of aorta: Secondary | ICD-10-CM | POA: Diagnosis not present

## 2021-01-04 DIAGNOSIS — R6881 Early satiety: Secondary | ICD-10-CM | POA: Diagnosis not present

## 2021-01-04 DIAGNOSIS — R0602 Shortness of breath: Secondary | ICD-10-CM | POA: Diagnosis not present

## 2021-01-04 LAB — COMPREHENSIVE METABOLIC PANEL
ALT: 15 U/L (ref 0–44)
AST: 24 U/L (ref 15–41)
Albumin: 3.4 g/dL — ABNORMAL LOW (ref 3.5–5.0)
Alkaline Phosphatase: 47 U/L (ref 38–126)
Anion gap: 7 (ref 5–15)
BUN: 26 mg/dL — ABNORMAL HIGH (ref 8–23)
CO2: 26 mmol/L (ref 22–32)
Calcium: 9 mg/dL (ref 8.9–10.3)
Chloride: 107 mmol/L (ref 98–111)
Creatinine, Ser: 1 mg/dL (ref 0.44–1.00)
GFR, Estimated: 53 mL/min — ABNORMAL LOW (ref >60)
Glucose, Bld: 131 mg/dL — ABNORMAL HIGH (ref 70–99)
Potassium: 4.3 mmol/L (ref 3.5–5.1)
Sodium: 140 mmol/L (ref 135–145)
Total Bilirubin: 0.8 mg/dL (ref 0.3–1.2)
Total Protein: 6 g/dL — ABNORMAL LOW (ref 6.5–8.1)

## 2021-01-04 LAB — CBC WITH DIFFERENTIAL/PLATELET
Abs Immature Granulocytes: 0.01 10*3/uL (ref 0.00–0.07)
Basophils Absolute: 0 10*3/uL (ref 0.0–0.1)
Basophils Relative: 1 %
Eosinophils Absolute: 0.1 10*3/uL (ref 0.0–0.5)
Eosinophils Relative: 2 %
HCT: 36.5 % (ref 36.0–46.0)
Hemoglobin: 11.4 g/dL — ABNORMAL LOW (ref 12.0–15.0)
Immature Granulocytes: 0 %
Lymphocytes Relative: 12 %
Lymphs Abs: 0.6 10*3/uL — ABNORMAL LOW (ref 0.7–4.0)
MCH: 32.4 pg (ref 26.0–34.0)
MCHC: 31.2 g/dL (ref 30.0–36.0)
MCV: 103.7 fL — ABNORMAL HIGH (ref 80.0–100.0)
Monocytes Absolute: 0.3 10*3/uL (ref 0.1–1.0)
Monocytes Relative: 7 %
Neutro Abs: 3.6 10*3/uL (ref 1.7–7.7)
Neutrophils Relative %: 78 %
Platelets: 178 10*3/uL (ref 150–400)
RBC: 3.52 MIL/uL — ABNORMAL LOW (ref 3.87–5.11)
RDW: 13.8 % (ref 11.5–15.5)
WBC: 4.6 10*3/uL (ref 4.0–10.5)
nRBC: 0 % (ref 0.0–0.2)

## 2021-01-04 LAB — MAGNESIUM: Magnesium: 2.2 mg/dL (ref 1.7–2.4)

## 2021-01-04 MED ORDER — LAPATINIB DITOSYLATE 250 MG PO TABS
1000.0000 mg | ORAL_TABLET | Freq: Every day | ORAL | 0 refills | Status: DC
  Filled 2021-01-04: qty 120, 30d supply, fill #0

## 2021-01-04 MED ORDER — HEPARIN SOD (PORK) LOCK FLUSH 100 UNIT/ML IV SOLN
500.0000 [IU] | Freq: Once | INTRAVENOUS | Status: AC
  Administered 2021-01-04: 500 [IU] via INTRAVENOUS

## 2021-01-04 MED ORDER — TEMAZEPAM 30 MG PO CAPS: ORAL_CAPSULE | ORAL | 1 refills | Status: DC

## 2021-01-04 MED ORDER — SODIUM CHLORIDE 0.9% FLUSH
10.0000 mL | INTRAVENOUS | Status: DC | PRN
  Administered 2021-01-04: 10 mL via INTRAVENOUS

## 2021-01-04 NOTE — Patient Instructions (Signed)
[place] at [facility] Discharge Instructions  You were seen today by Dr. Delton Coombes. He went over your recent results. You did not receive treatment today since your treatment will be changed. An oral chemotherapy option is lapatinib which is taken daily for your breast cancer, along with Herceptin infusions every 3 weeks. Side effects include diarrhea. Dr. Delton Coombes will see you back in 1 week for labs and follow up.   Thank you for choosing [place] at [facility] to provide your oncology and hematology care.  To afford each patient quality time with our provider, please arrive at least 15 minutes before your scheduled appointment time.   If you have a lab appointment with the [facility] please come in thru the Main Entrance and check in at the main information desk  You need to re-schedule your appointment should you arrive 10 or more minutes late.  We strive to give you quality time with our providers, and arriving late affects you and other patients whose appointments are after yours.  Also, if you no show three or more times for appointments you may be dismissed from the clinic at the providers discretion.     Again, thank you for choosing [facility].  Our hope is that these requests will decrease the amount of time that you wait before being seen by our physicians.       _____________________________________________________________  Should you have questions after your visit to [facility], please contact our office at [phone] between the hours of 8:00 a.m. and 4:30 p.m.  Voicemails left after 4:00 p.m. will not be returned until the following business day.  For prescription refill requests, have your pharmacy contact our office and allow 72 hours.    Cancer Center Support Programs:   > Cancer Support Group  2nd Tuesday of the month 1pm-2pm, Journey Room

## 2021-01-04 NOTE — Progress Notes (Signed)
Changing treatments, no treatment today.

## 2021-01-04 NOTE — Telephone Encounter (Signed)
Oral Oncology Pharmacist Encounter  Received new prescription for Tykerb (lapatinib) for the treatment of metastatic breast cancer HER2 positive in conjunction with trastuzumab, planned duration until disease progression or unacceptable drug toxicity. Carol Duarte with be transitioning from treatment with Enhertu (fam-trastuzumab) due to disease progression and intolerance.  CMP from 01/04/21 assessed, no relevant lab abnormalities. ECHO form 06/08/20 shows an ECHO of 65 to 70%, recommend repeating this periodically while on treatment. Prescription dose and frequency assessed. MD may potentially start [name] at 777m daily, then increase to 10054mdaily.   Current medication list in Epic reviewed, one relevant DDIs with lapatinib identified: -Simvastatin: lapatinib may increase the concentration of the active metabolite of simvastatin. Monitor patient for increased simvastatin adverse effects (eg, LFT elevation, myopathy, rhabdomyolysis). No baseline dose adjustment needed.   Evaluated chart and no patient barriers to medication adherence identified.   [facility].  Oral Oncology Clinic will continue to follow for insurance authorization, copayment issues, initial counseling and start date.  Carol PikesPharmD, BCPS, BCOP, CPP Hematology/Oncology Clinical Pharmacist Practitioner [phone]/04/2021 3:52 PM

## 2021-01-04 NOTE — Telephone Encounter (Signed)
Oral Oncology Patient Advocate Encounter  Received notification from [employer] that prior authorization for Lapatanib is required.  Insurance denied PA for brand name Tykerb, stating that patient must try the preferred brand first.  PA submitted on CoverMyMeds [place]  Status is pending  Oral Oncology Clinic will continue to follow.  [place] Patient [place] Phone [phone] Fax [phone] 01/05/2021 4:22 PM

## 2021-01-04 NOTE — Progress Notes (Signed)
Carol Duarte, [place] [address]   CLINIC:  Medical Oncology/Hematology  PCP:  [name], MD [place] / [place] [address] [phone]   REASON FOR VISIT:  Follow-up for IDC of left breast  PRIOR THERAPY:  1. Lumpectomy on 04/24/2017. 2. Left modified radical mastectomy on 05/28/2018. 3. Trastuzumab x 10 cycles & pertuzumab x 6 cycles from 08/09/2018 through 02/08/2019 emtansine x 8 cycles.  NGS Results: Not done  CURRENT THERAPY: Trastuzumab & Aloxi every 3 weeks; to begin lapatinib daily  BRIEF ONCOLOGIC HISTORY:  Oncology History  Invasive ductal carcinoma of left breast ([facility])  03/30/2017 Initial Diagnosis   Invasive ductal carcinoma of left breast ([place])   08/09/2018 - 02/28/2019 Chemotherapy   The patient had lapatinib (TYKERB) 250 MG tablet, 1,000 mg, Oral, Daily, 1 of 1 cycle, Start date: --, End date: -- trastuzumab (HERCEPTIN) 500 mg in sodium chloride 0.9 % 250 mL chemo infusion, 525 mg, Intravenous,  Once, 10 of 10 cycles Administration: 500 mg (08/09/2018), 350 mg (09/06/2018), 350 mg (09/27/2018), 378 mg (10/18/2018), 378 mg (11/15/2018), 378 mg (12/06/2018), 378 mg (12/28/2018), 378 mg (01/18/2019), 378 mg (02/08/2019) pertuzumab (PERJETA) 840 mg in sodium chloride 0.9 % 250 mL chemo infusion, 840 mg (100 % of original dose 840 mg), Intravenous, Once, 6 of 6 cycles Dose modification: 840 mg (original dose 840 mg, Cycle 5), 420 mg (original dose 420 mg, Cycle 6) Administration: 840 mg (11/15/2018), 420 mg (12/06/2018), 420 mg (12/28/2018), 420 mg (01/18/2019), 420 mg (02/08/2019)  for chemotherapy treatment.    09/18/2018 Genetic Testing   Negative genetic testing for the breast and gyn cancer panel.  The Breast/GYN gene panel offered by [facility] includes sequencing and rearrangement analysis for the following 23 genes:  ATM, BRCA1, BRCA2, BRIP1, CDH1, CHEK2, EPCAM, MLH1, MSH2, MSH6, NBN, NF1, PALB2, PMS2, PTEN, RAD51C, RAD51D,  STK11, and TP53.   The report date is 09/18/2018.   03/12/2019 - 08/20/2019 Chemotherapy   The patient had ado-trastuzumab emtansine (KADCYLA) 140 mg in sodium chloride 0.9 % 250 mL chemo infusion, 2.4 mg/kg = 140 mg (100 % of original dose 2.4 mg/kg), Intravenous, Once, 8 of 9 cycles Dose modification: 2.4 mg/kg (original dose 2.4 mg/kg, Cycle 1, Reason: Patient Age), 3 mg/kg (original dose 2.4 mg/kg, Cycle 3, Reason: Provider Judgment) Administration: 140 mg (03/12/2019), 180 mg (04/23/2019), 180 mg (06/18/2019), 180 mg (04/02/2019), 180 mg (05/28/2019), 180 mg (07/09/2019), 180 mg (07/30/2019), 180 mg (08/20/2019)  for chemotherapy treatment.    10/22/2019 -  Chemotherapy    Patient is on Treatment Plan: BREAST METASTATIC FAM-TRASTUZUMAB DERUXTECAN-NXKI (ENHERTU) Q21D        CANCER STAGING: Cancer Staging No matching staging information was found for the patient.  INTERVAL HISTORY:  [name], a 87 y.o. female, returns for routine follow-up and consideration for next cycle of immunotherapy. Carol Duarte was last seen on 12/17/2020.  Due for cycle #18 of trastuzumab and Aloxi today.   Today she is accompanied by her daughter. Overall, she tells me she has been feeling poorly. According to her daughter, she has been more active and sitting up in her chair and reclining less. She continues having pain in her left upper chest near the axilla but denies having any abdominal pain; the pain is occasionally severe. She denies having any more axillary pain. She asked for Norco twice in the past 3 weeks, otherwise she takes ibuprofen PRN. She denies having throat pain, though she reports  having trouble swallowing large pills and reports occasional changes in her voice, lasting about 1 hour. She has be eating more vegetables, though she gets early satiety and has to wait an hour before finishing the rest of her meal. She reports having constipation which becomes diarrhea if she takes a stool  softener.  She will forego treatment today since the regimen will be changed.    REVIEW OF SYSTEMS:  Review of Systems  Constitutional: Positive for appetite change (depleted), fatigue (depleted) and unexpected weight change (lost 3 lbs in 3 weeks).  HENT:   Positive for trouble swallowing (w/ large pills). Negative for sore throat.   Respiratory: Positive for shortness of breath (w/ exertion & w/ panic attacks).   Cardiovascular: Positive for chest pain (L upper chest pain).  Gastrointestinal: Positive for constipation (predominantly), diarrhea and nausea. Negative for abdominal pain.  Psychiatric/Behavioral: The patient is nervous/anxious (panic attackes).   All other systems reviewed and are negative.   PAST MEDICAL/SURGICAL HISTORY:  Past Medical History:  Diagnosis Date  . Cancer ([facility])    left breast  . History of kidney stones   . Hypercholesteremia   . Hypertension    Past Surgical History:  Procedure Laterality Date  . APPENDECTOMY    . brain cyst removed  2011  . BREAST BIOPSY Left 03/28/2018   Procedure: BREAST BIOPSY;  Surgeon: [name], MD;  Location: [facility];  Service: General;  Laterality: Left;  . CATARACT EXTRACTION W/PHACO Left 11/09/2015   Procedure: CATARACT EXTRACTION PHACO AND INTRAOCULAR LENS PLACEMENT LEFT EYE cde=8.97;  Surgeon: [name], MD;  Location: [facility];  Service: Ophthalmology;  Laterality: Left;  . CATARACT EXTRACTION W/PHACO Right 02/04/2019   Procedure: CATARACT EXTRACTION PHACO AND INTRAOCULAR LENS PLACEMENT (IOC);  Surgeon: [name], MD;  Location: [facility];  Service: Ophthalmology;  Laterality: Right;  CDE: 15.19  . EYE SURGERY     KPE left  . MASTECTOMY MODIFIED RADICAL Left 05/28/2018   Procedure: LEFT MODIFIED RADICAL MASTECTOMY;  Surgeon: [name], MD;  Location: [facility];  Service: General;  Laterality: Left;  Marland Kitchen MASTECTOMY, PARTIAL Left 04/24/2017   Procedure: MASTECTOMY PARTIAL;  Surgeon: [name], MD;  Location: [facility];   Service: General;  Laterality: Left;  . ORIF FEMUR FRACTURE Left 09/03/2019   Procedure: OPEN REDUCTION INTERNAL FIXATION (ORIF) LEFT HIP FEMUR FRACTURE;  Surgeon: Paralee Cancel, MD;  Location: [facility];  Service: Orthopedics;  Laterality: Left;  . PORTACATH PLACEMENT Right 08/08/2018   Procedure: INSERTION PORT-A-CATH;  Surgeon: [name], MD;  Location: [facility];  Service: General;  Laterality: Right;  . TONSILLECTOMY    . TONSILLECTOMY AND ADENOIDECTOMY      SOCIAL HISTORY:  Social History   Socioeconomic History  . Marital status: Widowed    Spouse name: Not on file  . Number of children: Not on file  . Years of education: Not on file  . Highest education level: Not on file  Occupational History  . Not on file  Tobacco Use  . Smoking status: Never Smoker  . Smokeless tobacco: Never Used  Vaping Use  . Vaping Use: Never used  Substance and Sexual Activity  . Alcohol use: No  . Drug use: No  . Sexual activity: Not Currently    Partners: Male  Other Topics Concern  . Not on file  Social History Narrative  . Not on file   Social Determinants of Health   Financial Resource Strain: Low Risk   . Difficulty of  Paying Living Expenses: Not hard at all  Food Insecurity: No Food Insecurity  . Worried About Charity fundraiser in the Last Year: Never true  . Ran Out of Food in the Last Year: Never true  Transportation Needs: No Transportation Needs  . Lack of Transportation (Medical): No  . Lack of Transportation (Non-Medical): No  Physical Activity: Insufficiently Active  . Days of Exercise per Week: 3 days  . Minutes of Exercise per Session: 20 min  Stress: No Stress Concern Present  . Feeling of Stress : Only a little  Social Connections: Moderately Isolated  . Frequency of Communication with Friends and Family: More than three times a week  . Frequency of Social Gatherings with Friends and Family: More than three times a week  . Attends Religious Services: 1 to 4 times  per year  . Active Member of Clubs or Organizations: Not on file  . Attends Archivist Meetings: Never  . Marital Status: Widowed  Intimate Partner Violence: Not At Risk  . Fear of Current or Ex-Partner: No  . Emotionally Abused: No  . Physically Abused: No  . Sexually Abused: No    FAMILY HISTORY:  Family History  Problem Relation Age of Onset  . Heart failure Mother   . Heart failure Father   . Congestive Heart Failure Brother   . Tuberculosis Paternal Uncle   . Tuberculosis Maternal Grandmother   . Tuberculosis Maternal Grandfather   . Congestive Heart Failure Brother     CURRENT MEDICATIONS:  Current Outpatient Medications  Medication Sig Dispense Refill  . Ado-Trastuzumab Emtansine (KADCYLA IV) Inject into the vein every 21 ( twenty-one) days.    Marland Kitchen amLODipine (NORVASC) 5 MG tablet Take 5 mg by mouth daily.    . diphenhydrAMINE (BENADRYL) 50 MG tablet Take 1 tablet (50 mg total) by mouth at bedtime as needed for itching. One tablet 1 hr before procedure 1 tablet 0  . ferrous sulfate 325 (65 FE) MG tablet Take 325 mg by mouth daily with breakfast.    . FLUZONE HIGH-DOSE QUADRIVALENT 0.7 ML SUSY     . HYDROcodone-acetaminophen (NORCO/VICODIN) 5-325 MG tablet Take 1 tablet by mouth every 6 (six) hours as needed for moderate pain or severe pain. 30 tablet 0  . labetalol (NORMODYNE) 100 MG tablet Take 1 tablet (100 mg total) by mouth 2 (two) times daily. 60 tablet 12  . megestrol (MEGACE) 400 MG/10ML suspension Take 10 mLs (400 mg total) by mouth 2 (two) times daily. 480 mL 0  . oxybutynin (DITROPAN) 5 MG tablet Take 5 mg by mouth daily.     . predniSONE (DELTASONE) 50 MG tablet Take one tablet 13hr, 7hr, and 1 hr before procedure 3 tablet 0  . PREVIDENT 5000 BOOSTER PLUS 1.1 % PSTE APPLY WITH TOOTHBRUSH AT BEDTIME AS DIRECTED.    Marland Kitchen simvastatin (ZOCOR) 40 MG tablet Take 40 mg by mouth daily.    . ondansetron (ZOFRAN) 8 MG tablet Take 0.5 tablets (4 mg total) by mouth  every 8 (eight) hours as needed for nausea or vomiting. (Patient not taking: Reported on 01/04/2021) 60 tablet 3  . temazepam (RESTORIL) 30 MG capsule TAKE 1 CAPSULE BY MOUTH AT BEDTIME AS NEEDED FOR SLEEP 30 capsule 1   No current facility-administered medications for this visit.    ALLERGIES:  Allergies  Allergen Reactions  . Contrast Media [Iodinated Diagnostic Agents] Diarrhea    Patient is NOT allergic to IV contrast. The Readi cat (oral contrast)  caused diarrhea which is a side effect of oral contrast.     PHYSICAL EXAM:  Performance status (ECOG): 2 - Symptomatic, <50% confined to bed  Vitals:   01/04/21 1002  BP: (!) 104/50  Pulse: (!) 52  Resp: 18  Temp: (!) 96.9 F (36.1 C)  SpO2: 100%   Wt Readings from Last 3 Encounters:  01/04/21 95 lb 6.4 oz (43.3 kg)  12/17/20 98 lb 3.2 oz (44.5 kg)  11/26/20 98 lb 9.6 oz (44.7 kg)   Physical Exam Vitals reviewed.  Constitutional:      Appearance: Normal appearance.     Comments: In wheelchair  Cardiovascular:     Rate and Rhythm: Normal rate and regular rhythm.     Pulses: Normal pulses.     Heart sounds: Normal heart sounds.  Pulmonary:     Effort: Pulmonary effort is normal.     Breath sounds: Normal breath sounds.  Chest:     Comments: Port-a-Cath in R chest Abdominal:     Palpations: Abdomen is soft.     Tenderness: There is no abdominal tenderness.  Musculoskeletal:     Right lower leg: No edema.     Left lower leg: No edema.  Neurological:     General: No focal deficit present.     Mental Status: She is alert and oriented to person, place, and time.  Psychiatric:        Mood and Affect: Mood normal.        Behavior: Behavior normal.     LABORATORY DATA:  I have reviewed the labs as listed.  CBC Latest Ref Rng & Units 01/04/2021 12/17/2020 11/26/2020  WBC 4.0 - 10.5 K/uL 4.6 5.6 5.1  Hemoglobin 12.0 - 15.0 g/dL 11.4(L) 11.5(L) 11.4(L)  Hematocrit 36.0 - 46.0 % 36.5 36.6 36.4  Platelets 150 - 400 K/uL 178  193 197   CMP Latest Ref Rng & Units 01/04/2021 12/17/2020 11/26/2020  Glucose 70 - 99 mg/dL 131(H) 115(H) 128(H)  BUN 8 - 23 mg/dL 26(H) 32(H) 27(H)  Creatinine 0.44 - 1.00 mg/dL 1.00 1.13(H) 1.11(H)  Sodium 135 - 145 mmol/L 140 139 139  Potassium 3.5 - 5.1 mmol/L 4.3 4.1 4.3  Chloride 98 - 111 mmol/L 107 108 106  CO2 22 - 32 mmol/L '26 24 24  ' Calcium 8.9 - 10.3 mg/dL 9.0 8.7(L) 9.0  Total Protein 6.5 - 8.1 g/dL 6.0(L) 5.8(L) 6.1(L)  Total Bilirubin 0.3 - 1.2 mg/dL 0.8 0.7 0.6  Alkaline Phos 38 - 126 U/L 47 45 44  AST 15 - 41 U/L 24 32 26  ALT 0 - 44 U/L '15 20 16    ' DIAGNOSTIC IMAGING:  I have independently reviewed the scans and discussed with the patient. CT Chest W Contrast  Result Date: 12/15/2020 CLINICAL DATA:  Left-sided chest pain. History of breast cancer status post lumpectomy and chemotherapy in 2018. EXAM: CT CHEST WITH CONTRAST TECHNIQUE: Multidetector CT imaging of the chest was performed during intravenous contrast administration. CONTRAST:  72m OMNIPAQUE IOHEXOL 300 MG/ML  SOLN COMPARISON:  CT scan 04/08/2020 and PET-CT 09/25/2020 FINDINGS: Cardiovascular: The heart is normal in size. No pericardial effusion. Stable mild tortuosity and calcification of the thoracic aorta. Stable mild pulmonary artery enlargement which could suggest pulmonary hypertension. Mediastinum/Nodes: Stable calcified mediastinal and hilar lymph nodes. Stable large hiatal hernia. Lungs/Pleura: Dominant left infrahilar/left lower lobe partially calcified mass measures 6.0 x 6.0 cm and previously measured 5.8 x 5.0 cm. Calcified right lower lobe lung mass on image  87/2 measures a maximum of 3 cm and previously measured 2.7 cm. Peripheral left upper lobe lung nodule on image 30/4 measures 14 mm and previously measured 10.5 mm. 2.5 cm right upper lobe lesion adjacent to the major fissure on image number 71/4 measures 2.5 cm and previously measured 2.3 cm. Medial right lower lobe lung mass on image 105/4 measures  27 mm and previously measured 25 mm. Small bilateral pleural effusions are noted. Upper Abdomen: No significant upper abdominal findings. Stable hepatic cysts. Musculoskeletal: No significant bony findings. No lytic or sclerotic bone lesions are identified. IMPRESSION: 1. Slight interval increase in size of the dominant left infrahilar/left lower lobe lung mass and other bilateral pulmonary lesions. 2. Stable calcified mediastinal and hilar lymph nodes. 3. Small bilateral pleural effusions. 4. Stable large hiatal hernia. 5. Stable hepatic cysts. Aortic Atherosclerosis (ICD10-I70.0) and Emphysema (ICD10-J43.9). Electronically Signed   By: Marijo Sanes M.D.   On: 12/15/2020 21:09     ASSESSMENT:  1. Metastatic HER-2 positive left breast cancer: -Enhertu started on 10/22/2019. -CT CAP on 01/10/2020 showed response to therapy with decreasing lung nodules as well as supraclavicular and mediastinal adenopathy with no new evidence of metastatic disease. -CA 15-3 of 6.4 on 02/27/2020. -CT CAP on 04/08/2020 shows more or less stable lung and liver lesions. Left infrahilar mass measures 5.6 x 5.3 cm, previously 5.4 x 5.4 cm. No new areas were seen. Small dependent bilateral pleural effusions are new. -We will do nextCT scan WITHOUT PO CONTRAST. -PET scan on 06/22/2020 reviewed by me with bilateral scattered hypermetabolic pulmonary metastatic lesions slightly stable to improved from CT scan from 04/08/2020. No extrathoracic lesions. Small bilateral pleural effusions. -PET CT scan on 09/25/2020 showed equivocal response with some lesions decreasing in size and some lesions increasing in size up to 3 mm.  2. High risk drug monitoring: -Echo on 11/12/2019, EF 60-65%. -Echo on 02/24/2020 with EF 60-65%. -2D echo on 06/08/2020 with EF 65 to 70%.   PLAN:  1. Metastatic HER-2 positive left breast cancer: -CT scan on 12/15/2020 showed slight interval increase in size of the dominant left infrahilar/left lower  lobe lung mass with bilateral pulmonary lesions. - She reported hoarseness in the last 2 weeks.  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 - Reviewed labs from today which showed grossly normal LFTs.  CBC was also normal. - We talked about best supportive care versus active therapy.  Patient would like to have active treatments. - Because of her borderline performance status, I have recommended subsequent line of therapy with lapatinib and Herceptin. - We discussed side effects in detail of lapatinib.  I will likely start her on 750 mg daily and increase it to 1000 mg daily if well-tolerated. - We will likely start her treatment in the next 1 to 2 weeks.  2. High risk drug monitoring: -Last echo on 06/08/2020 was normal. - We will consider repeating it again.  3.Nausea: -Continue Zofran 8 mg twice daily as needed.  4. Difficulty sleeping: -Continue temazepam 30 mg at bedtime as needed.  5. Weight loss: -She lost 3 pounds in the last 3 weeks.  She reports eating well. - Recommend nutritional supplements.  She did not start Megace because of insurance reasons.  6.Left chest [place] pain: -Left anterior chest [place]man pain is episodic. - She takes hydrocodone for severe pain.  She required to take it twice in the last 3 weeks.  For moderate pain, she takes ibuprofen which is controlling. - If there is any  worsening of pain, will consider radiation therapy.   Orders placed this encounter:  No orders of the defined types were placed in this encounter.    Derek Jack, MD [place] [phone]   I, Milinda Antis, am acting as a scribe for Dr. Sanda Linger.  I, Derek Jack MD, have reviewed the above documentation for accuracy and completeness, and I agree with the above.

## 2021-01-04 NOTE — Progress Notes (Signed)
DISCONTINUE ON PATHWAY REGIMEN - Breast     A cycle is every 21 days:     Fam-trastuzumab deruxtecan-nxki   **Always confirm dose/schedule in your pharmacy ordering system**  REASON: Disease Progression PRIOR TREATMENT: [id number]: Fam-trastuzumab Deruxtecan 5.4 mg/kg IV D1 q21 Days TREATMENT RESPONSE: Progressive Disease (PD)  START ON PATHWAY REGIMEN - Breast     A cycle is 21 days:     Lapatinib      Trastuzumab-xxxx      Trastuzumab-xxxx   **Always confirm dose/schedule in your pharmacy ordering system**  Patient Characteristics: Distant Metastases or Locoregional Recurrent Disease - Unresected or Locally Advanced Unresectable Disease Progressing after Neoadjuvant and Local Therapies, HER2 Positive, ER Negative/Unknown, Chemotherapy, Fourth Line and Beyond, Trastuzumab-based  Doublet Therapy Indicated Therapeutic Status: Distant Metastases ER Status: Negative (-) HER2 Status: Positive (+) PR Status: Negative (-) Line of Therapy: Fourth Line and Beyond Therapy Indicated: Trastuzumab-based Doublet Therapy Indicated Intent of Therapy: Non-Curative / Palliative Intent, Discussed with Patient

## 2021-01-05 DIAGNOSIS — I10 Essential (primary) hypertension: Secondary | ICD-10-CM | POA: Diagnosis not present

## 2021-01-05 DIAGNOSIS — C50919 Malignant neoplasm of unspecified site of unspecified female breast: Secondary | ICD-10-CM | POA: Diagnosis not present

## 2021-01-05 NOTE — Progress Notes (Signed)
The following biosimilar Kanjinti (trastuzumab-anns) has been selected for use in this patient per insurance step therapy.  Henreitta Leber, PharmD

## 2021-01-06 ENCOUNTER — Other Ambulatory Visit (HOSPITAL_COMMUNITY): Payer: Self-pay

## 2021-01-06 NOTE — Telephone Encounter (Signed)
Oral Oncology [facility] Advocate Encounter  Prior Authorization for Lapatinib has been approved.    PA# [id number] Effective dates: 01/05/21 through 08/28/21  Patients co-pay is $757.95.  Oral Oncology Clinic will continue to follow.   Carol Duarte Daniel Phone [phone] Fax [phone] 01/06/2021 10:05 AM

## 2021-01-07 ENCOUNTER — Telehealth (HOSPITAL_COMMUNITY): Payer: Self-pay | Admitting: Pharmacy Technician

## 2021-01-07 ENCOUNTER — Other Ambulatory Visit (HOSPITAL_COMMUNITY): Payer: Self-pay

## 2021-01-07 NOTE — Telephone Encounter (Signed)
Oral Oncology Patient Advocate Encounter  Spoke with Jeannene Patella, patients daughter, about applying for patient assistance through [employer].  With her permission, I submitted an online application to Patient Assistance [facility]).  This will provide [place] to the patient at no out of pocket cost if approved.  Confirmation number: [id number]  I completed the Health Care Provider form online but must have the prescription form signed and fax it to [phone].  [facility] phone number is [phone].  Deltana Patient [place] Phone [phone] Fax [phone] 01/07/2021 10:36 AM

## 2021-01-09 NOTE — Progress Notes (Incomplete)
Carol Duarte [address] [place] [address]   Patient Care Team: [name], MD as PCP - General (Internal Medicine) Herminio Commons, MD (Inactive) as PCP - Cardiology (Cardiology) Derek Jack, MD as Medical Oncologist (Oncology) Donetta Potts, RN as Oncology Nurse Navigator (Oncology)  SUMMARY OF ONCOLOGIC HISTORY: Oncology History  Invasive ductal carcinoma of left breast ([place])  03/30/2017 Initial Diagnosis   Invasive ductal carcinoma of left breast ([place])   08/09/2018 - 02/28/2019 Chemotherapy   The patient had lapatinib (TYKERB) 250 MG tablet, 1,000 mg, Oral, Daily, 1 of 1 cycle, Start date: --, End date: -- trastuzumab (HERCEPTIN) 500 mg in sodium chloride 0.9 % 250 mL chemo infusion, 525 mg, Intravenous,  Once, 10 of 10 cycles Administration: 500 mg (08/09/2018), 350 mg (09/06/2018), 350 mg (09/27/2018), 378 mg (10/18/2018), 378 mg (11/15/2018), 378 mg (12/06/2018), 378 mg (12/28/2018), 378 mg (01/18/2019), 378 mg (02/08/2019) pertuzumab (PERJETA) 840 mg in sodium chloride 0.9 % 250 mL chemo infusion, 840 mg (100 % of original dose 840 mg), Intravenous, Once, 6 of 6 cycles Dose modification: 840 mg (original dose 840 mg, Cycle 5), 420 mg (original dose 420 mg, Cycle 6) Administration: 840 mg (11/15/2018), 420 mg (12/06/2018), 420 mg (12/28/2018), 420 mg (01/18/2019), 420 mg (02/08/2019)  for chemotherapy treatment.    09/18/2018 Genetic Testing   Negative genetic testing for the breast and gyn cancer panel.  The Breast/GYN gene panel offered by [facility] includes sequencing and rearrangement analysis for the following 23 genes:  ATM, BRCA1, BRCA2, BRIP1, CDH1, CHEK2, EPCAM, MLH1, MSH2, MSH6, NBN, NF1, PALB2, PMS2, PTEN, RAD51C, RAD51D, STK11, and TP53.   The report date is 09/18/2018.   03/12/2019 - 08/20/2019 Chemotherapy   The patient had ado-trastuzumab emtansine (KADCYLA) 140 mg in sodium chloride 0.9 % 250 mL chemo infusion, 2.4 mg/kg = 140 mg (100 % of original  dose 2.4 mg/kg), Intravenous, Once, 8 of 9 cycles Dose modification: 2.4 mg/kg (original dose 2.4 mg/kg, Cycle 1, Reason: Patient Age), 3 mg/kg (original dose 2.4 mg/kg, Cycle 3, Reason: Provider Judgment) Administration: 140 mg (03/12/2019), 180 mg (04/23/2019), 180 mg (06/18/2019), 180 mg (04/02/2019), 180 mg (05/28/2019), 180 mg (07/09/2019), 180 mg (07/30/2019), 180 mg (08/20/2019)  for chemotherapy treatment.    10/22/2019 - 11/26/2020 Chemotherapy         01/11/2021 -  Chemotherapy    Patient is on Treatment Plan: BREAST LAPATINIB/TRASTUZUMAB Q21D X 6 CYCLES        CHIEF COMPLIANT: IDC of left breast   INTERVAL HISTORY: [name] is a 87 y.o. female here today for follow up of her IDC of left breast. Her last visit was on 01/04/2021. ***  Today she reports feeling  REVIEW OF SYSTEMS:   Review of Systems  All other systems reviewed and are negative.   I have reviewed the past medical history, past surgical history, social history and family history with the patient and they are unchanged from previous note.   ALLERGIES:   is allergic to contrast media [iodinated diagnostic agents].   MEDICATIONS:  Current Outpatient Medications  Medication Sig Dispense Refill  . Ado-Trastuzumab Emtansine (KADCYLA IV) Inject into the vein every 21 ( twenty-one) days.    Marland Kitchen amLODipine (NORVASC) 5 MG tablet Take 5 mg by mouth daily.    . diphenhydrAMINE (BENADRYL) 50 MG tablet Take 1 tablet (50 mg total) by mouth at bedtime as needed for itching. One tablet 1 hr before procedure 1 tablet 0  .  ferrous sulfate 325 (65 FE) MG tablet Take 325 mg by mouth daily with breakfast.    . FLUZONE HIGH-DOSE QUADRIVALENT 0.7 ML SUSY     . HYDROcodone-acetaminophen (NORCO/VICODIN) 5-325 MG tablet Take 1 tablet by mouth every 6 (six) hours as needed for moderate pain or severe pain. 30 tablet 0  . labetalol (NORMODYNE) 100 MG tablet Take 1 tablet (100 mg total) by mouth 2 (two) times daily. 60 tablet  12  . lapatinib (TYKERB) 250 MG tablet Take 4 tablets (1,000 mg total) by mouth daily. Take on an empty stomach, at least 1 hour before or 1 hour after meals. 120 tablet 0  . megestrol (MEGACE) 400 MG/10ML suspension Take 10 mLs (400 mg total) by mouth 2 (two) times daily. 480 mL 0  . ondansetron (ZOFRAN) 8 MG tablet Take 0.5 tablets (4 mg total) by mouth every 8 (eight) hours as needed for nausea or vomiting. (Patient not taking: Reported on 01/04/2021) 60 tablet 3  . oxybutynin (DITROPAN) 5 MG tablet Take 5 mg by mouth daily.     . predniSONE (DELTASONE) 50 MG tablet Take one tablet 13hr, 7hr, and 1 hr before procedure 3 tablet 0  . PREVIDENT 5000 BOOSTER PLUS 1.1 % PSTE APPLY WITH TOOTHBRUSH AT BEDTIME AS DIRECTED.    . simvastatin (ZOCOR) 40 MG tablet Take 40 mg by mouth daily.    . temazepam (RESTORIL) 30 MG capsule TAKE 1 CAPSULE BY MOUTH AT BEDTIME AS NEEDED FOR SLEEP 30 capsule 1   No current facility-administered medications for this visit.     PHYSICAL EXAMINATION: Performance status (ECOG): 2 - Symptomatic, <50% confined to bed  There were no vitals filed for this visit. Wt Readings from Last 3 Encounters:  01/04/21 95 lb 6.4 oz (43.3 kg)  12/17/20 98 lb 3.2 oz (44.5 kg)  11/26/20 98 lb 9.6 oz (44.7 kg)   Physical Exam Vitals reviewed.  Constitutional:      Appearance: Normal appearance.  Cardiovascular:     Rate and Rhythm: Normal rate and regular rhythm.     Pulses: Normal pulses.     Heart sounds: Normal heart sounds.  Pulmonary:     Effort: Pulmonary effort is normal.     Breath sounds: Normal breath sounds.  Neurological:     General: No focal deficit present.     Mental Status: She is alert and oriented to person, place, and time.  Psychiatric:        Mood and Affect: Mood normal.        Behavior: Behavior normal.     Breast Exam Chaperone: Kirstyn Evans     LABORATORY DATA:  I have reviewed the data as listed CMP Latest Ref Rng & Units 01/04/2021  12/17/2020 11/26/2020  Glucose 70 - 99 mg/dL 131(H) 115(H) 128(H)  BUN 8 - 23 mg/dL 26(H) 32(H) 27(H)  Creatinine 0.44 - 1.00 mg/dL 1.00 1.13(H) 1.11(H)  Sodium 135 - 145 mmol/L 140 139 139  Potassium 3.5 - 5.1 mmol/L 4.3 4.1 4.3  Chloride 98 - 111 mmol/L 107 108 106  CO2 22 - 32 mmol/L 26 24 24  Calcium 8.9 - 10.3 mg/dL 9.0 8.7(L) 9.0  Total Protein 6.5 - 8.1 g/dL 6.0(L) 5.8(L) 6.1(L)  Total Bilirubin 0.3 - 1.2 mg/dL 0.8 0.7 0.6  Alkaline Phos 38 - 126 U/L 47 45 44  AST 15 - 41 U/L 24 32 26  ALT 0 - 44 U/L 15 20 16   Lab Results  Component Value Date     CAN153 8.0 12/17/2020   CAN153 7.3 11/26/2020   CAN153 7.0 10/19/2020   Lab Results  Component Value Date   WBC 4.6 01/04/2021   HGB 11.4 (L) 01/04/2021   HCT 36.5 01/04/2021   MCV 103.7 (H) 01/04/2021   PLT 178 01/04/2021   NEUTROABS 3.6 01/04/2021    ASSESSMENT:  1. Metastatic HER-2 positive left breast cancer: -Enhertu started on 10/22/2019. -CT CAP on 01/10/2020 showed response to therapy with decreasing lung nodules as well as supraclavicular and mediastinal adenopathy with no new evidence of metastatic disease. -CA 15-3 of 6.4 on 02/27/2020. -CT CAP on 04/08/2020 shows more or less stable lung and liver lesions. Left infrahilar mass measures 5.6 x 5.3 cm, previously 5.4 x 5.4 cm. No new areas were seen. Small dependent bilateral pleural effusions are new. -We will do nextCT scan WITHOUT PO CONTRAST. -PET scan on 06/22/2020 reviewed by me with bilateral scattered hypermetabolic pulmonary metastatic lesions slightly stable to improved from CT scan from 04/08/2020. No extrathoracic lesions. Small bilateral pleural effusions. -PET CT scan on 09/25/2020 showed equivocal response with some lesions decreasing in size and some lesions increasing in size up to 3 mm.  2. High risk drug monitoring: -Echo on 11/12/2019, EF 60-65%. -Echo on 02/24/2020 with EF 60-65%. -2D echo on 06/08/2020 with EF 65 to 70%.   PLAN:  1.  Metastatic HER-2 positive left breast cancer:  -CT scan on 12/15/2020 showed slight interval increase in size of the dominant left infrahilar/left lower lobe lung mass with bilateral pulmonary lesions.  - She reported hoarseness in the last 2 weeks. 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  - Reviewed labs from today which showed grossly normal LFTs. CBC was also normal.  - We talked about best supportive care versus active therapy. Patient would like to have active treatments.  - Because of her borderline performance status, I have recommended subsequent line of therapy with lapatinib and Herceptin.  - We discussed side effects in detail of lapatinib. I will likely start her on 750 mg daily and increase it to 1000 mg daily if well-tolerated.  - We will likely start her treatment in the next 1 to 2 weeks.  2. High risk drug monitoring:  -Last echo on 06/08/2020 was normal.  - We will consider repeating it again.  3. Nausea:  -Continue Zofran 8 mg twice daily as needed.  4. Difficulty sleeping:  -Continue temazepam 30 mg at bedtime as needed.  5. Weight loss: -She lost 3 pounds in the last 3 weeks.  She reports eating well. - Recommend nutritional supplements.  She did not start Megace because of insurance reasons.  6.Left chest [place] pain: -Left anterior chest [place] pain is episodic. - She takes hydrocodone for severe pain.  She required to take it twice in the last 3 weeks.  For moderate pain, she takes ibuprofen which is controlling. - If there is any worsening of pain, will consider radiation therapy. Breast Cancer therapy associated bone loss: I have recommended calcium, Vitamin D and weight bearing exercises.  Orders placed this encounter:  No orders of the defined types were placed in this encounter.   The patient has a good understanding of the overall plan. She agrees with it. She will call with any problems that may develop before the next visit here.  Sreedhar  Katragadda, MD [facility] [phone]   I, Kirstyn Evans, am acting as a scribe for Dr. Sreedhar Katragadda.  {Add Scribe Attestation Statement}    

## 2021-01-11 ENCOUNTER — Inpatient Hospital Stay (HOSPITAL_COMMUNITY): Payer: Medicare Other

## 2021-01-11 ENCOUNTER — Inpatient Hospital Stay (HOSPITAL_COMMUNITY): Payer: Medicare Other | Admitting: Hematology

## 2021-01-11 ENCOUNTER — Telehealth (HOSPITAL_COMMUNITY): Payer: Self-pay | Admitting: Pharmacy Technician

## 2021-01-11 NOTE — Telephone Encounter (Signed)
Faxed MD form to [phone].  [facility] Patient [place] Phone [phone] Fax [phone] 01/11/2021 11:48 AM

## 2021-01-12 NOTE — Telephone Encounter (Deleted)
Oral Oncology Patient Advocate Encounter  With the patients permission, I submitted an online application to Patient Assistance [facility]) on 01/07/21. [facility] will screen the patients application to see if they are eligible for assistance through [place].  If approved, Tykerb would be provided to the patient at no out of pocket cost.  Confirmation number: [id number]  I faxed the completed Health Care Provider form to [phone] on 01/11/21.  PANO phone number is [phone].  [place] Patient [place] Phone [phone] Fax [phone]

## 2021-01-12 NOTE — Telephone Encounter (Signed)
Oral Oncology Patient Advocate Encounter  Received notification from [employer] that patient has been successfully enrolled into their program to receive Tykerb from the manufacturer at $0 out of pocket until 08/28/21.    I called and spoke with patient's daughter. She knows we will have to re-apply.   Specialty Pharmacy that will dispense medication is [facility].  Patient knows to call the office with questions or concerns.   Oral Oncology Clinic will continue to follow.  [place] Patient [place] Phone [phone] Fax [phone] 01/12/2021 3:13 PM

## 2021-01-12 NOTE — Telephone Encounter (Signed)
Erroneous encounter

## 2021-01-12 NOTE — Telephone Encounter (Signed)
Spoke to [place] with [facility] and went over pharmacy benefits for Tykerb.  Since out of pocket is unaffordable to the patient, she is forwarding the application to [employer].  They will fax the determination to the office and patient.  [place] Patient [place] Phone [phone] Fax [phone] 01/12/2021 12:15 PM

## 2021-01-19 ENCOUNTER — Telehealth (HOSPITAL_COMMUNITY): Payer: Self-pay

## 2021-01-19 NOTE — Telephone Encounter (Signed)
Carol Duarte, patient's daughter, called to inform us that she received the Tykerb 250 mg in the mail today.  Patient is scheduled to start infusions on 02/01/2021.  Patient was advised to start Tykerb 250 mg three pills daily per Dr. Tomie China last note and he will increase to 1000 mg per day when she starts infusions if she is tolerating the medication well.

## 2021-02-01 ENCOUNTER — Ambulatory Visit (HOSPITAL_COMMUNITY): Payer: Medicare Other | Admitting: Hematology

## 2021-02-01 ENCOUNTER — Encounter (HOSPITAL_COMMUNITY): Payer: Self-pay

## 2021-02-01 ENCOUNTER — Ambulatory Visit (HOSPITAL_COMMUNITY): Payer: Medicare Other

## 2021-02-01 ENCOUNTER — Other Ambulatory Visit (HOSPITAL_COMMUNITY): Payer: Medicare Other

## 2021-02-01 NOTE — Progress Notes (Signed)
Patient's daughter called on call line over the weekend stating that patient is unable to tolerate future treatment, wishing to cancel appts. I called the daughter this morning who states that patient is unable to tolerate Lapatinib due to extreme faitgue, nausea and vomiting. Patient stopped taking Lapatinib, stating last dose was on 01/28/21. Patient does not wish to have any future Trastuzumab infusions but would like to have an office visit with Dr. Delton Coombes to discuss further. Patient scheduled for labs and office visit on 6/9

## 2021-02-03 NOTE — Progress Notes (Signed)
[place] [place], [place] [address]   CLINIC:  Medical Oncology/Hematology  PCP:  [name], MD [place] / [place] [id number] [phone]   REASON FOR VISIT:  Follow-up for IDC of left breast  PRIOR THERAPY:  1. Lumpectomy on 04/24/2017. 2. Left modified radical mastectomy on 05/28/2018. 3. Trastuzumab x 10 cycles & pertuzumab x 6 cycles from 08/09/2018 through 02/08/2019 emtansine x 8 cycles. 4. Trastuzumab & Aloxi last 11/26/2020  NGS Results: not done  CURRENT THERAPY: surveillance  BRIEF ONCOLOGIC HISTORY:  Oncology History  Invasive ductal carcinoma of left breast ([place])  03/30/2017 Initial Diagnosis   Invasive ductal carcinoma of left breast ([place])   08/09/2018 - 02/28/2019 Chemotherapy   The patient had lapatinib (TYKERB) 250 MG tablet, 1,000 mg, Oral, Daily, 1 of 1 cycle, Start date: --, End date: -- trastuzumab (HERCEPTIN) 500 mg in sodium chloride 0.9 % 250 mL chemo infusion, 525 mg, Intravenous,  Once, 10 of 10 cycles Administration: 500 mg (08/09/2018), 350 mg (09/06/2018), 350 mg (09/27/2018), 378 mg (10/18/2018), 378 mg (11/15/2018), 378 mg (12/06/2018), 378 mg (12/28/2018), 378 mg (01/18/2019), 378 mg (02/08/2019) pertuzumab (PERJETA) 840 mg in sodium chloride 0.9 % 250 mL chemo infusion, 840 mg (100 % of original dose 840 mg), Intravenous, Once, 6 of 6 cycles Dose modification: 840 mg (original dose 840 mg, Cycle 5), 420 mg (original dose 420 mg, Cycle 6) Administration: 840 mg (11/15/2018), 420 mg (12/06/2018), 420 mg (12/28/2018), 420 mg (01/18/2019), 420 mg (02/08/2019)  for chemotherapy treatment.    09/18/2018 Genetic Testing   Negative genetic testing for the breast and gyn cancer panel.  The Breast/GYN gene panel offered by [facility] includes sequencing and rearrangement analysis for the following 23 genes:  ATM, BRCA1, BRCA2, BRIP1, CDH1, CHEK2, EPCAM, MLH1, MSH2, MSH6, NBN, NF1, PALB2, PMS2, PTEN, RAD51C, RAD51D, STK11, and  TP53.   The report date is 09/18/2018.   03/12/2019 - 08/20/2019 Chemotherapy   The patient had ado-trastuzumab emtansine (KADCYLA) 140 mg in sodium chloride 0.9 % 250 mL chemo infusion, 2.4 mg/kg = 140 mg (100 % of original dose 2.4 mg/kg), Intravenous, Once, 8 of 9 cycles Dose modification: 2.4 mg/kg (original dose 2.4 mg/kg, Cycle 1, Reason: Patient Age), 3 mg/kg (original dose 2.4 mg/kg, Cycle 3, Reason: Provider Judgment) Administration: 140 mg (03/12/2019), 180 mg (04/23/2019), 180 mg (06/18/2019), 180 mg (04/02/2019), 180 mg (05/28/2019), 180 mg (07/09/2019), 180 mg (07/30/2019), 180 mg (08/20/2019)  for chemotherapy treatment.    10/22/2019 - 11/26/2020 Chemotherapy         02/01/2021 -  Chemotherapy    Patient is on Treatment Plan: BREAST LAPATINIB/TRASTUZUMAB Q21D X 6 CYCLES        CANCER STAGING: Cancer Staging No matching staging information was found for the patient.  INTERVAL HISTORY:  Carol Duarte, a 87 y.o. female, returns for routine follow-up and consideration for next cycle of immunotherapy. [name] was last seen on 01/04/2021.  Due for cycle #1 of Lapatinib/Trastuzumab today.   Overall, she tells me she has been feeling pretty poorly. She reports elevated levels of fatigue and spends most of her day in bed and in a chair. She had nausea prior to treatment but it has worsened recently. Her appetite is absent, and she has lost 7 lbs in 1 month. She has not been taking the megestrol she was prescribed. She reports trouble swallowing liquids and pills; she reports a strangling sensation and nausea following drinking fluids. She  reports 3 episodes of vomiting in 1 day after drinking liquid, but that has not occurred since. She expresses interest in stopping treatment.   Overall, she feels ready for next cycle of immunotherapy today.   REVIEW OF SYSTEMS:  Review of Systems  Constitutional:  Positive for appetite change (0%) and fatigue (depleted).  HENT:   Positive for  trouble swallowing (liquids and pills).   Gastrointestinal:  Positive for abdominal pain and constipation.  All other systems reviewed and are negative.  PAST MEDICAL/SURGICAL HISTORY:  Past Medical History:  Diagnosis Date   Cancer [facility])    left breast   History of kidney stones    Hypercholesteremia    Hypertension    Past Surgical History:  Procedure Laterality Date   APPENDECTOMY     brain cyst removed  2011   BREAST BIOPSY Left 03/28/2018   Procedure: BREAST BIOPSY;  Surgeon: [name], MD;  Location: [facility];  Service: General;  Laterality: Left;   CATARACT EXTRACTION W/PHACO Left 11/09/2015   Procedure: CATARACT EXTRACTION PHACO AND INTRAOCULAR LENS PLACEMENT LEFT EYE cde=8.97;  Surgeon: [name], MD;  Location: [facility];  Service: Ophthalmology;  Laterality: Left;   CATARACT EXTRACTION W/PHACO Right 02/04/2019   Procedure: CATARACT EXTRACTION PHACO AND INTRAOCULAR LENS PLACEMENT (IOC);  Surgeon: [name], MD;  Location: [facility];  Service: Ophthalmology;  Laterality: Right;  CDE: 15.19   EYE SURGERY     KPE left   MASTECTOMY MODIFIED RADICAL Left 05/28/2018   Procedure: LEFT MODIFIED RADICAL MASTECTOMY;  Surgeon: [name], MD;  Location: [facility];  Service: General;  Laterality: Left;   MASTECTOMY, PARTIAL Left 04/24/2017   Procedure: MASTECTOMY PARTIAL;  Surgeon: [name], MD;  Location: [facility];  Service: General;  Laterality: Left;   ORIF FEMUR FRACTURE Left 09/03/2019   Procedure: OPEN REDUCTION INTERNAL FIXATION (ORIF) LEFT HIP FEMUR FRACTURE;  Surgeon: Paralee Cancel, MD;  Location: [facility];  Service: Orthopedics;  Laterality: Left;   PORTACATH PLACEMENT Right 08/08/2018   Procedure: INSERTION PORT-A-CATH;  Surgeon: [name], MD;  Location: [facility];  Service: General;  Laterality: Right;   TONSILLECTOMY     TONSILLECTOMY AND ADENOIDECTOMY      SOCIAL HISTORY:  Social History   Socioeconomic History   Marital status: Widowed    Spouse name: Not on file    Number of children: Not on file   Years of education: Not on file   Highest education level: Not on file  Occupational History   Not on file  Tobacco Use   Smoking status: Never Smoker   Smokeless tobacco: Never Used  Vaping Use   Vaping Use: Never used  Substance and Sexual Activity   Alcohol use: No   Drug use: No   Sexual activity: Not Currently    Partners: Male  Other Topics Concern   Not on file  Social History Narrative   Not on file   Social Determinants of Health   Financial Resource Strain: Low Risk    Difficulty of Paying Living Expenses: Not hard at all  Food Insecurity: No Food Insecurity   Worried About Charity fundraiser in the Last Year: Never true   [place] in the Last Year: Never true  Transportation Needs: No Transportation Needs   Lack of Transportation (Medical): No   Lack of Transportation (Non-Medical): No  Physical Activity: Insufficiently Active   Days of Exercise per Week: 3 days   Minutes of Exercise per Session: 20  min  Stress: No Stress Concern Present   Feeling of Stress : Only a little  Social Connections: Moderately Isolated   Frequency of Communication with Friends and Family: More than three times a week   Frequency of Social Gatherings with Friends and Family: More than three times a week   Attends Religious Services: 1 to 4 times per year   Active Member of [employer] or Organizations: Not on file   Attends Archivist Meetings: Never   Marital Status: Widowed  Human resources officer Violence: Not At Risk   Fear of Current or Ex-Partner: No   Emotionally Abused: No   Physically Abused: No   Sexually Abused: No    FAMILY HISTORY:  Family History  Problem Relation Age of Onset   Heart failure Mother    Heart failure Father    Congestive Heart Failure Brother    Tuberculosis Paternal Uncle    Tuberculosis Maternal Grandmother    Tuberculosis Maternal Grandfather    Congestive Heart Failure Brother     CURRENT  MEDICATIONS:  Current Outpatient Medications  Medication Sig Dispense Refill   Ado-Trastuzumab Emtansine (KADCYLA IV) Inject into the vein every 21 ( twenty-one) days.     amLODipine (NORVASC) 5 MG tablet Take 5 mg by mouth daily.     diphenhydrAMINE (BENADRYL) 50 MG tablet Take 1 tablet (50 mg total) by mouth at bedtime as needed for itching. One tablet 1 hr before procedure 1 tablet 0   ferrous sulfate 325 (65 FE) MG tablet Take 325 mg by mouth daily with breakfast.     FLUZONE HIGH-DOSE QUADRIVALENT 0.7 ML SUSY      HYDROcodone-acetaminophen (NORCO/VICODIN) 5-325 MG tablet Take 1 tablet by mouth every 6 (six) hours as needed for moderate pain or severe pain. 30 tablet 0   labetalol (NORMODYNE) 100 MG tablet Take 1 tablet (100 mg total) by mouth 2 (two) times daily. 60 tablet 12   lapatinib (TYKERB) 250 MG tablet Take 4 tablets (1,000 mg total) by mouth daily. Take on an empty stomach, at least 1 hour before or 1 hour after meals. 120 tablet 0   megestrol (MEGACE) 400 MG/10ML suspension Take 10 mLs (400 mg total) by mouth 2 (two) times daily. 480 mL 0   ondansetron (ZOFRAN) 8 MG tablet Take 0.5 tablets (4 mg total) by mouth every 8 (eight) hours as needed for nausea or vomiting. (Patient not taking: Reported on 01/04/2021) 60 tablet 3   oxybutynin (DITROPAN) 5 MG tablet Take 5 mg by mouth daily.      predniSONE (DELTASONE) 50 MG tablet Take one tablet 13hr, 7hr, and 1 hr before procedure 3 tablet 0   PREVIDENT 5000 BOOSTER PLUS 1.1 % PSTE APPLY WITH TOOTHBRUSH AT BEDTIME AS DIRECTED.     simvastatin (ZOCOR) 40 MG tablet Take 40 mg by mouth daily.     temazepam (RESTORIL) 30 MG capsule TAKE 1 CAPSULE BY MOUTH AT BEDTIME AS NEEDED FOR SLEEP 30 capsule 1   No current facility-administered medications for this visit.    ALLERGIES:  Allergies  Allergen Reactions   Contrast Media [Iodinated Diagnostic Agents] Diarrhea    Patient is NOT allergic to IV contrast. The Readi cat (oral contrast) caused  diarrhea which is a side effect of oral contrast.     PHYSICAL EXAM:  Performance status (ECOG): 2 - Symptomatic, <50% confined to bed  There were no vitals filed for this visit. Wt Readings from Last 3 Encounters:  01/04/21 95 lb 6.4  oz (43.3 kg)  12/17/20 98 lb 3.2 oz (44.5 kg)  11/26/20 98 lb 9.6 oz (44.7 kg)   Physical Exam Vitals reviewed.  Constitutional:      Appearance: Normal appearance.     Comments: In wheelchair  Cardiovascular:     Rate and Rhythm: Normal rate and regular rhythm.     Pulses: Normal pulses.     Heart sounds: Normal heart sounds.  Pulmonary:     Effort: Pulmonary effort is normal.     Breath sounds: Normal breath sounds.  Musculoskeletal:     Right lower leg: No edema.     Left lower leg: No edema.  Neurological:     General: No focal deficit present.     Mental Status: She is alert and oriented to person, place, and time.  Psychiatric:        Mood and Affect: Mood normal.        Behavior: Behavior normal.    LABORATORY DATA:  I have reviewed the labs as listed.  CBC Latest Ref Rng & Units 01/04/2021 12/17/2020 11/26/2020  WBC 4.0 - 10.5 K/uL 4.6 5.6 5.1  Hemoglobin 12.0 - 15.0 g/dL 11.4(L) 11.5(L) 11.4(L)  Hematocrit 36.0 - 46.0 % 36.5 36.6 36.4  Platelets 150 - 400 K/uL 178 193 197   CMP Latest Ref Rng & Units 01/04/2021 12/17/2020 11/26/2020  Glucose 70 - 99 mg/dL 131(H) 115(H) 128(H)  BUN 8 - 23 mg/dL 26(H) 32(H) 27(H)  Creatinine 0.44 - 1.00 mg/dL 1.00 1.13(H) 1.11(H)  Sodium 135 - 145 mmol/L 140 139 139  Potassium 3.5 - 5.1 mmol/L 4.3 4.1 4.3  Chloride 98 - 111 mmol/L 107 108 106  CO2 22 - 32 mmol/L _0 Calcium 8.9 - 10.3 mg/dL 9.0 8.7(L) 9.0  Total Protein 6.5 - 8.1 g/dL 6.0(L) 5.8(L) 6.1(L)  Total Bilirubin 0.3 - 1.2 mg/dL 0.8 0.7 0.6  Alkaline Phos 38 - 126 U/L 47 45 44  AST 15 - 41 U/L 24 32 26  ALT 0 - 44 U/L _1 DIAGNOSTIC IMAGING:  I have independently reviewed the scans and discussed with the patient. No  results found.   ASSESSMENT:  1.  Metastatic HER-2 positive left breast cancer: -Enhertu started on 10/22/2019. -CT CAP on 01/10/2020 showed response to therapy with decreasing lung nodules as well as supraclavicular and mediastinal adenopathy with no new evidence of metastatic disease. -CA 15-3 of 6.4 on 02/27/2020. -CT CAP on 04/08/2020 shows more or less stable lung and liver lesions.  Left infrahilar mass measures 5.6 x 5.3 cm, previously 5.4 x 5.4 cm.  No new areas were seen.  Small dependent bilateral pleural effusions are new. -We will do next CT scan WITHOUT PO CONTRAST. -PET scan on 06/22/2020 reviewed by me with bilateral scattered hypermetabolic pulmonary metastatic lesions slightly stable to improved from CT scan from 04/08/2020.  No extrathoracic lesions.  Small bilateral pleural effusions. -PET CT scan on 09/25/2020 showed equivocal response with some lesions decreasing in size and some lesions increasing in size up to 3 mm.   2.  High risk drug monitoring: -Echo on 11/12/2019, EF 60-65%. -Echo on 02/24/2020 with EF 60-65%. -2D echo on 06/08/2020 with EF 65 to 70%.   PLAN:  1.  Metastatic HER-2 positive left breast cancer: - She started lapatinib 750 mg daily and was able to tolerate it only for few days.  She had severe nausea and vomiting and fatigue. - We discussed various options including  best supportive care in the form of hospice.  She is too weak to tolerate any active therapies. - She and her daughter are agreeable for hospice.  We will make a referral to [facility]. - I will deprescribe amlodipine, iron tablet and Zocor at this time. - As she is feeling dehydrated, we will give her 1 L of normal saline over 2 hours. - RTC as needed.   2.  High risk drug monitoring: - Last echo on 06/08/2020 was normal. - Will not repeating echo as we are stopping treatments.   3.  Nausea: - Continue Zofran 8 mg every 8 hours as needed.   4.  Difficulty  sleeping: -Continue temazepam 30 mg at bedtime as needed.   5.  Weight loss: - She lost 7 pounds in the last 3 to 4 weeks. - She was told to start back on Megace.  Also drink nutritional supplements.   6.  Left chest Goggins pain: - She has episodic left anterior chest Dano pain. - Continue hydrocodone 5/325 every 6 hours as needed.   Orders placed this encounter:  No orders of the defined types were placed in this encounter.    Derek Jack, MD [place] [phone]   I, Thana Ates, am acting as a scribe for Dr. Derek Jack.  I, Derek Jack MD, have reviewed the above documentation for accuracy and completeness, and I agree with the above.

## 2021-02-04 ENCOUNTER — Inpatient Hospital Stay (HOSPITAL_BASED_OUTPATIENT_CLINIC_OR_DEPARTMENT_OTHER): Payer: Medicare Other | Admitting: Hematology

## 2021-02-04 ENCOUNTER — Inpatient Hospital Stay (HOSPITAL_COMMUNITY): Payer: Medicare Other | Attending: Hematology

## 2021-02-04 ENCOUNTER — Inpatient Hospital Stay (HOSPITAL_COMMUNITY): Payer: Medicare Other

## 2021-02-04 ENCOUNTER — Other Ambulatory Visit (HOSPITAL_COMMUNITY): Payer: Self-pay | Admitting: *Deleted

## 2021-02-04 ENCOUNTER — Other Ambulatory Visit: Payer: Self-pay

## 2021-02-04 DIAGNOSIS — C50912 Malignant neoplasm of unspecified site of left female breast: Secondary | ICD-10-CM

## 2021-02-04 DIAGNOSIS — Z8249 Family history of ischemic heart disease and other diseases of the circulatory system: Secondary | ICD-10-CM | POA: Insufficient documentation

## 2021-02-04 DIAGNOSIS — R0789 Other chest pain: Secondary | ICD-10-CM | POA: Insufficient documentation

## 2021-02-04 DIAGNOSIS — C7801 Secondary malignant neoplasm of right lung: Secondary | ICD-10-CM | POA: Insufficient documentation

## 2021-02-04 DIAGNOSIS — R110 Nausea: Secondary | ICD-10-CM | POA: Insufficient documentation

## 2021-02-04 DIAGNOSIS — R634 Abnormal weight loss: Secondary | ICD-10-CM | POA: Diagnosis not present

## 2021-02-04 DIAGNOSIS — R109 Unspecified abdominal pain: Secondary | ICD-10-CM | POA: Diagnosis not present

## 2021-02-04 DIAGNOSIS — C7802 Secondary malignant neoplasm of left lung: Secondary | ICD-10-CM | POA: Insufficient documentation

## 2021-02-04 DIAGNOSIS — C50512 Malignant neoplasm of lower-outer quadrant of left female breast: Secondary | ICD-10-CM | POA: Insufficient documentation

## 2021-02-04 DIAGNOSIS — R5383 Other fatigue: Secondary | ICD-10-CM | POA: Insufficient documentation

## 2021-02-04 DIAGNOSIS — Z836 Family history of other diseases of the respiratory system: Secondary | ICD-10-CM | POA: Insufficient documentation

## 2021-02-04 DIAGNOSIS — Z79899 Other long term (current) drug therapy: Secondary | ICD-10-CM | POA: Diagnosis not present

## 2021-02-04 DIAGNOSIS — K5900 Constipation, unspecified: Secondary | ICD-10-CM | POA: Insufficient documentation

## 2021-02-04 LAB — COMPREHENSIVE METABOLIC PANEL
ALT: 14 U/L (ref 0–44)
AST: 28 U/L (ref 15–41)
Albumin: 3.5 g/dL (ref 3.5–5.0)
Alkaline Phosphatase: 50 U/L (ref 38–126)
Anion gap: 5 (ref 5–15)
BUN: 27 mg/dL — ABNORMAL HIGH (ref 8–23)
CO2: 25 mmol/L (ref 22–32)
Calcium: 9.2 mg/dL (ref 8.9–10.3)
Chloride: 109 mmol/L (ref 98–111)
Creatinine, Ser: 1.03 mg/dL — ABNORMAL HIGH (ref 0.44–1.00)
GFR, Estimated: 50 mL/min — ABNORMAL LOW (ref >60)
Glucose, Bld: 154 mg/dL — ABNORMAL HIGH (ref 70–99)
Potassium: 4.1 mmol/L (ref 3.5–5.1)
Sodium: 139 mmol/L (ref 135–145)
Total Bilirubin: 0.5 mg/dL (ref 0.3–1.2)
Total Protein: 6.1 g/dL — ABNORMAL LOW (ref 6.5–8.1)

## 2021-02-04 LAB — CBC WITH DIFFERENTIAL/PLATELET
Abs Immature Granulocytes: 0.01 10*3/uL (ref 0.00–0.07)
Basophils Absolute: 0 10*3/uL (ref 0.0–0.1)
Basophils Relative: 0 %
Eosinophils Absolute: 0.1 10*3/uL (ref 0.0–0.5)
Eosinophils Relative: 2 %
HCT: 35.7 % — ABNORMAL LOW (ref 36.0–46.0)
Hemoglobin: 11.8 g/dL — ABNORMAL LOW (ref 12.0–15.0)
Immature Granulocytes: 0 %
Lymphocytes Relative: 17 %
Lymphs Abs: 0.8 10*3/uL (ref 0.7–4.0)
MCH: 33.4 pg (ref 26.0–34.0)
MCHC: 33.1 g/dL (ref 30.0–36.0)
MCV: 101.1 fL — ABNORMAL HIGH (ref 80.0–100.0)
Monocytes Absolute: 0.4 10*3/uL (ref 0.1–1.0)
Monocytes Relative: 8 %
Neutro Abs: 3.7 10*3/uL (ref 1.7–7.7)
Neutrophils Relative %: 73 %
Platelets: 194 10*3/uL (ref 150–400)
RBC: 3.53 MIL/uL — ABNORMAL LOW (ref 3.87–5.11)
RDW: 12.8 % (ref 11.5–15.5)
WBC: 5 10*3/uL (ref 4.0–10.5)
nRBC: 0 % (ref 0.0–0.2)

## 2021-02-04 LAB — LACTATE DEHYDROGENASE: LDH: 124 U/L (ref 98–192)

## 2021-02-04 LAB — MAGNESIUM: Magnesium: 2.1 mg/dL (ref 1.7–2.4)

## 2021-02-04 MED ORDER — TEMAZEPAM 30 MG PO CAPS: 30.0000 mg | ORAL_CAPSULE | Freq: Every day | ORAL | 1 refills | Status: AC

## 2021-02-04 MED ORDER — SODIUM CHLORIDE 0.9 % IV SOLN: Freq: Once | INTRAVENOUS | Status: AC

## 2021-02-04 MED ORDER — SODIUM CHLORIDE 0.9% FLUSH
10.0000 mL | INTRAVENOUS | Status: DC | PRN
  Administered 2021-02-04: 10 mL via INTRAVENOUS

## 2021-02-04 MED ORDER — HEPARIN SOD (PORK) LOCK FLUSH 100 UNIT/ML IV SOLN
500.0000 [IU] | Freq: Once | INTRAVENOUS | Status: DC
  Administered 2021-02-04: 500 [IU] via INTRAVENOUS

## 2021-02-04 MED ORDER — ONDANSETRON HCL 8 MG PO TABS: 8.0000 mg | ORAL_TABLET | Freq: Two times a day (BID) | ORAL | 3 refills | Status: AC | PRN

## 2021-02-04 NOTE — Progress Notes (Signed)
Patients port flushed without difficulty.  Good blood return noted with no bruising or swelling noted at site.  Patient remains accessed for possible hydration.  Patient is stable with no complaints voiced.

## 2021-02-04 NOTE — Progress Notes (Signed)
Patient presents today for follow up visit with Dr. Delton Coombes. Vital signs Stable. Message received for [place] RN/ Dr. Omar Person to add patient on to the schedule for 1 L of Normal Saline over an hour.   1 Liter of Normal Saline given over 2 hours today per MD orders. Tolerated infusion without adverse affects. Vital signs stable. No complaints at this time. Discharged from clinic via wheel chair in stable condition. Alert and oriented x 3. F/U with [facility] as scheduled.

## 2021-02-04 NOTE — Patient Instructions (Addendum)
[place] at [facility] Discharge Instructions  You were seen today by Dr. Delton Coombes. He went over your recent results. Dr. Delton Coombes will see you back in as needed for follow up.   Thank you for choosing [place] at [facility] to provide your oncology and hematology care.  To afford each patient quality time with our provider, please arrive at least 15 minutes before your scheduled appointment time.   If you have a lab appointment with the [facility] please come in thru the Main Entrance and check in at the main information desk  You need to re-schedule your appointment should you arrive 10 or more minutes late.  We strive to give you quality time with our providers, and arriving late affects you and other patients whose appointments are after yours.  Also, if you no show three or more times for appointments you may be dismissed from the clinic at the providers discretion.     Again, thank you for choosing [facility].  Our hope is that these requests will decrease the amount of time that you wait before being seen by our physicians.       _____________________________________________________________  Should you have questions after your visit to [facility], please contact our office at [phone] between the hours of 8:00 a.m. and 4:30 p.m.  Voicemails left after 4:00 p.m. will not be returned until the following business day.  For prescription refill requests, have your pharmacy contact our office and allow 72 hours.    Cancer Center Support Programs:   > Cancer Support Group  2nd Tuesday of the month 1pm-2pm, Journey Room

## 2021-02-04 NOTE — Patient Instructions (Signed)
[facility]  Discharge Instructions: °Thank you for choosing [facility] to provide your oncology and hematology care.  °If you have a lab appointment with the Cancer Center, please come in thru the Main Entrance and check in at the main information desk. ° °Wear comfortable clothing and clothing appropriate for easy access to any Portacath or PICC line.  ° °We strive to give you quality time with your provider. You may need to reschedule your appointment if you arrive late (15 or more minutes).  Arriving late affects you and other patients whose appointments are after yours.  Also, if you miss three or more appointments without notifying the office, you may be dismissed from the clinic at the provider’s discretion.    °  °For prescription refill requests, have your pharmacy contact our office and allow 72 hours for refills to be completed.   ° °Today you received the following : 1 Liter of Normal Saline.    °  °To help prevent nausea and vomiting after your treatment, we encourage you to take your nausea medication as directed. ° °BELOW ARE SYMPTOMS THAT SHOULD BE REPORTED IMMEDIATELY: °*FEVER GREATER THAN 100.4 F (38 °C) OR HIGHER °*CHILLS OR SWEATING °*NAUSEA AND VOMITING THAT IS NOT CONTROLLED WITH YOUR NAUSEA MEDICATION °*UNUSUAL SHORTNESS OF BREATH °*UNUSUAL BRUISING OR BLEEDING °*URINARY PROBLEMS (pain or burning when urinating, or frequent urination) °*BOWEL PROBLEMS (unusual diarrhea, constipation, pain near the anus) °TENDERNESS IN MOUTH AND THROAT WITH OR WITHOUT PRESENCE OF ULCERS (sore throat, sores in mouth, or a toothache) °UNUSUAL RASH, SWELLING OR PAIN  °UNUSUAL VAGINAL DISCHARGE OR ITCHING  ° °Items with * indicate a potential emergency and should be followed up as soon as possible or go to the Emergency Department if any problems should occur. ° °Please show the CHEMOTHERAPY ALERT CARD or IMMUNOTHERAPY ALERT CARD at check-in to the Emergency Department and triage  nurse. ° °Should you have questions after your visit or need to cancel or reschedule your appointment, please contact [facility] [phone]  and follow the prompts.  Office hours are 8:00 a.m. to 4:30 p.m. Monday - Friday. Please note that voicemails left after 4:00 p.m. may not be returned until the following business day.  We are closed weekends and major holidays. You have access to a nurse at all times for urgent questions. Please call the main number to the clinic [phone] and follow the prompts. ° °For any non-urgent questions, you may also contact your provider using MyChart. We now offer e-Visits for anyone 18 and older to request care online for non-urgent symptoms. For details visit [email or web address]. °  °Also download the MyChart app! Go to the app store, search "MyChart", open the app, select [place], and log in with your MyChart username and password. ° °Due to Covid, a mask is required upon entering the hospital/clinic. If you do not have a mask, one will be given to you upon arrival. For doctor visits, patients may have 1 support person aged 18 or older with them. For treatment visits, patients cannot have anyone with them due to current Covid guidelines and our immunocompromised population.  °

## 2021-02-05 LAB — CANCER ANTIGEN 15-3: CA 15-3: 7.6 U/mL (ref 0.0–25.0)

## 2021-02-22 ENCOUNTER — Ambulatory Visit (HOSPITAL_COMMUNITY): Payer: Medicare Other

## 2021-02-22 ENCOUNTER — Other Ambulatory Visit (HOSPITAL_COMMUNITY): Payer: Medicare Other

## 2021-02-22 ENCOUNTER — Ambulatory Visit (HOSPITAL_COMMUNITY): Payer: Medicare Other | Admitting: Hematology

## 2021-04-29 DEATH — deceased

## 2021-08-08 IMAGING — CT NM PET TUM IMG RESTAG (PS) SKULL BASE T - THIGH
3 series · 25 of 25 positions shown · non-contrast
Comparison: Multiple exams, including CT from 04/08/2020 and prior
PET-CT from 06/10/2019

CLINICAL DATA: Subsequent treatment strategy for breast cancer.
Left mastectomy in 0233. Ongoing Enhertu therapy.

EXAM:
NUCLEAR MEDICINE PET SKULL BASE TO THIGH
TECHNIQUE: 7.8 mCi F-18 FDG was injected intravenously. Full-ring PET imaging
was performed from the skull base to thigh after the radiotracer. CT
data was obtained and used for attenuation correction and anatomic
localization.
Fasting blood glucose: 97 mg/dl

[Series 3: ct wb fusion · axial · 5.0mm · 0.98mm/px · z∈[-1435,-648]mm · 9 of 316 slices shown]
[im 1/316]
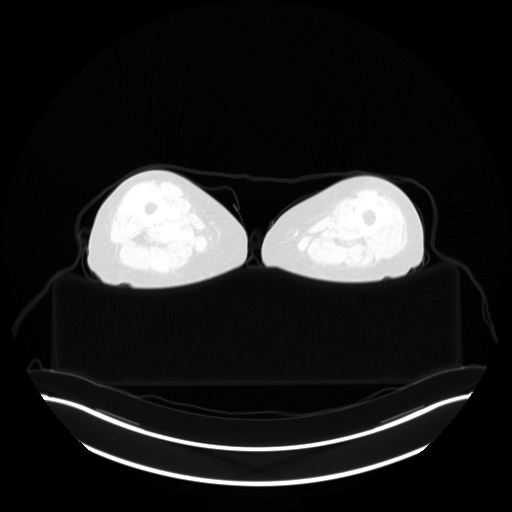
[im 40/316]
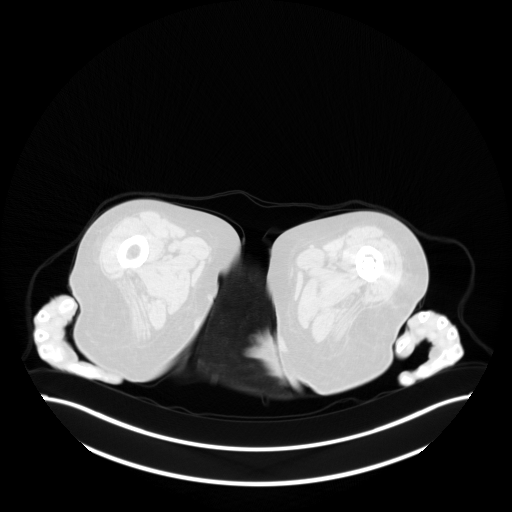
[im 79/316]
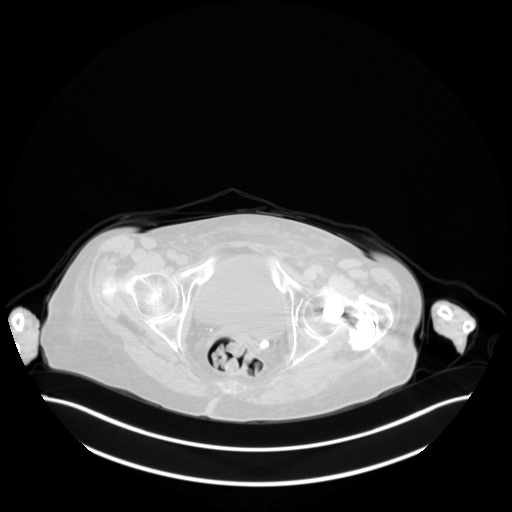
[im 119/316]
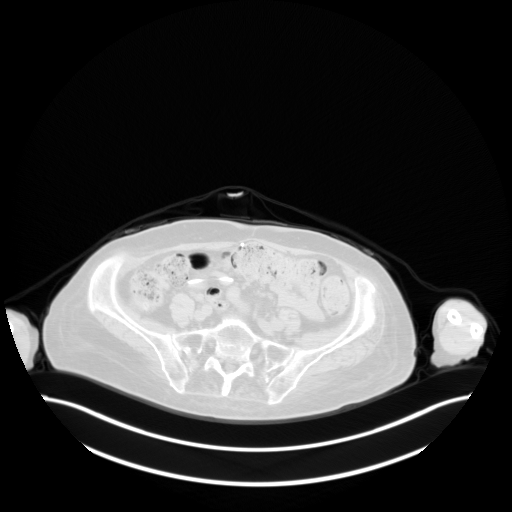
[im 158/316]
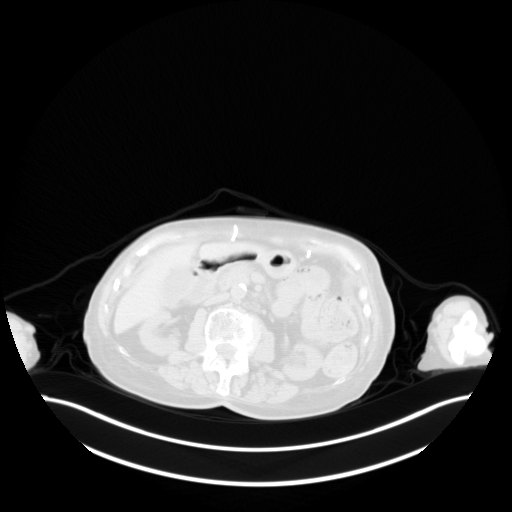
[im 197/316]
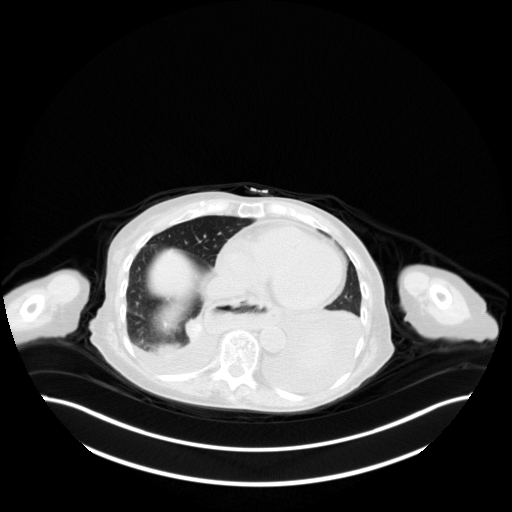
[im 237/316]
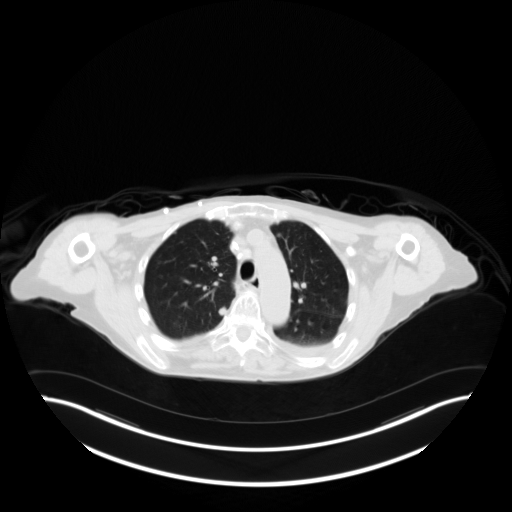
[im 276/316]
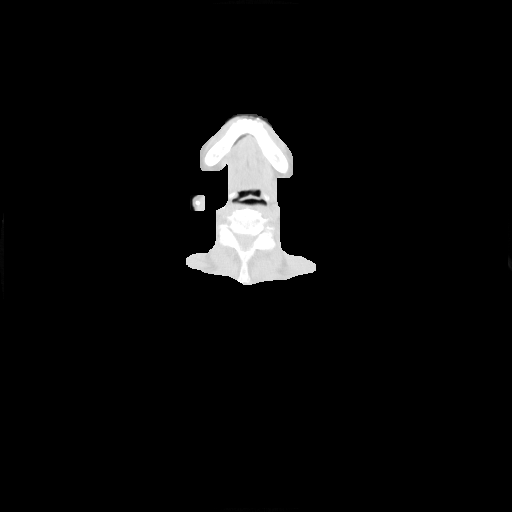
[im 316/316  brain]
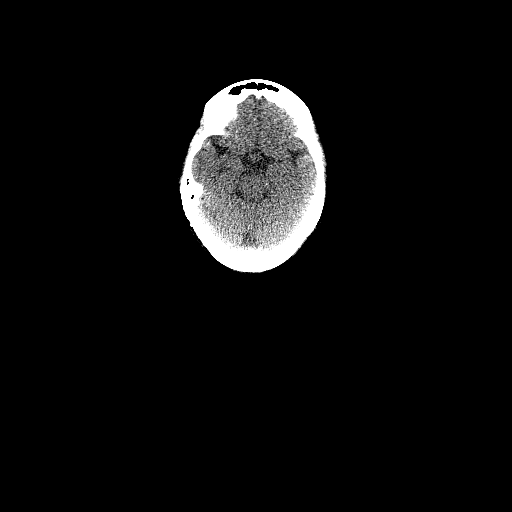

[Series 4: pet wb · axial · 5.0mm · 4.11mm/px · z∈[-1435,-648]mm · 8 of 316 slices shown]
[im 1/316]
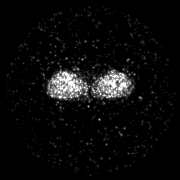
[im 46/316]
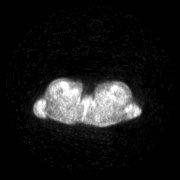
[im 91/316]
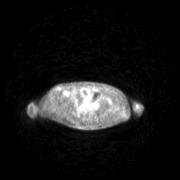
[im 136/316]
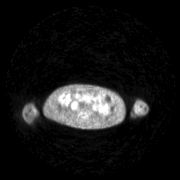
[im 181/316]
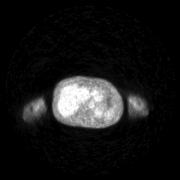
[im 226/316]
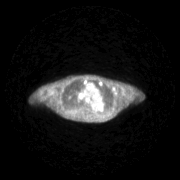
[im 271/316]
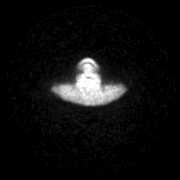
[im 316/316]
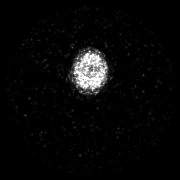

[Series 5: pet wb uncorrected · axial · 5.0mm · 4.11mm/px · z∈[-1435,-648]mm · 8 of 316 slices shown]
[im 1/316]
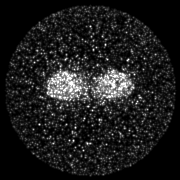
[im 46/316]
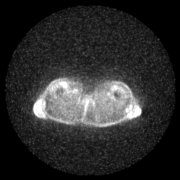
[im 91/316]
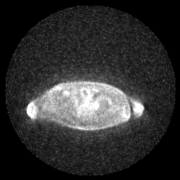
[im 136/316]
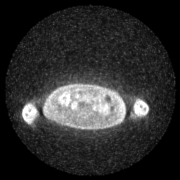
[im 181/316]
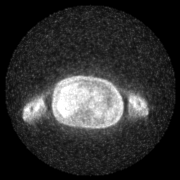
[im 226/316]
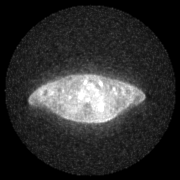
[im 271/316]
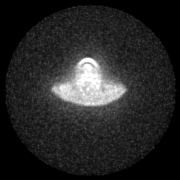
[im 316/316]
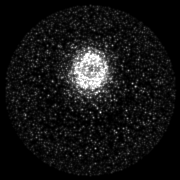

[25 of 25 positions shown; findings below may reference images not displayed]

FINDINGS: Mediastinal blood pool activity: SUV max

Liver activity: SUV max NA

NECK: Localized activity in the right maxilla corresponding to
periapical lucency is thought to be dental in origin.

Incidental CT findings: Bilateral common carotid atherosclerotic
calcification.

CHEST: Scattered bilateral hypermetabolic pulmonary metastatic
lesions are again identified.

The dominant mass in the left lower lobe has a maximum SUV of
(formerly 14.9 on 06/10/2019), faint internal calcifications, and
measures 5.4 by 5.7 cm on image 106 of series 3, previously 4.5 by
4.3 cm on 06/10/2019 and previously by my measurement 5.6 by 5.4 cm
on 04/08/2020. This mass is partially calcified.

A dominant lesion in the right lower lobe measures 2.8 by 2.0 cm on
image 101 of series 3, with maximum SUV of 11.3 (formerly 18.4).
This lesion measured 2.3 by 1.6 cm on 06/10/2019 and 2.6 by 2.0 cm
on 04/08/2020.

The general pattern for the pulmonary nodules is that compared to
last year's PET-CT, they have mildly improved in activity (although
still substantially abnormal), but have enlarged in size.

Persistent supraclavicular, right paratracheal, right hilar, right
subcarinal, and paraesophageal adenopathy noted. Index subcarinal
node 0.7 cm in short axis on image 93 of series 3 (formerly 1.0 cm
on 04/08/2020 and formerly 1.0 cm on 06/10/2019) with maximum SUV
7.2 (formerly 9.2 on 06/10/2019). Accordingly the adenopathy is
mildly improved. These lymph nodes are partially calcified.

Subtle activity along the left posterior pleural/diaphragmatic
space, maximum SUV 2.5, previously 3.5.

Incidental CT findings: Coronary, aortic arch, and branch vessel
atherosclerotic vascular disease. Small bilateral pleural effusions,
increased from prior.

ABDOMEN/PELVIS: No significant abnormal hypermetabolic activity in
this region.

Incidental CT findings: Left hepatic lobe cysts. Aortoiliac
atherosclerotic vascular disease. VP shunt noted. Prominent stool
throughout the colon favors constipation.

SKELETON: No significant abnormal hypermetabolic activity in this
region.

Incidental CT findings: Lumbar spondylosis and degenerative disc
disease. Unchanged left proximal femoral hardware.
IMPRESSION: 1. The bilateral scattered hypermetabolic pulmonary metastatic
lesions and adenopathy in the chest is again noted, with the
pulmonary nodules improved in activity compared to last year but
having enlarged since last year; and with the adenopathy improved
both in activity and size compared to last year. The lung nodules
are roughly stable from 04/08/2020.
2. No compelling findings of extra thoracic malignancy.
3. Increased size of small bilateral pleural effusions.
4. Other imaging findings of potential clinical significance: Aortic
Atherosclerosis ([id number]). Coronary atherosclerosis. Prominent
stool throughout the colon favors constipation. Lumbar spondylosis
and degenerative disc disease.

## 2022-03-21 ENCOUNTER — Other Ambulatory Visit: Payer: Self-pay
